# Patient Record
Sex: Male | Born: 1945
Health system: Southern US, Community
[De-identification: ages and names within clinical notes are randomized; demographics above are authoritative.]

## PROBLEM LIST (undated history)

## (undated) DIAGNOSIS — R0600 Dyspnea, unspecified: Secondary | ICD-10-CM

## (undated) DIAGNOSIS — K2289 Other specified disease of esophagus: Secondary | ICD-10-CM

## (undated) DIAGNOSIS — R011 Cardiac murmur, unspecified: Secondary | ICD-10-CM

## (undated) DIAGNOSIS — C801 Malignant (primary) neoplasm, unspecified: Secondary | ICD-10-CM

## (undated) DIAGNOSIS — J449 Chronic obstructive pulmonary disease, unspecified: Secondary | ICD-10-CM

## (undated) DIAGNOSIS — K228 Other specified diseases of esophagus: Secondary | ICD-10-CM

## (undated) DIAGNOSIS — I10 Essential (primary) hypertension: Secondary | ICD-10-CM

## (undated) DIAGNOSIS — Z9981 Dependence on supplemental oxygen: Secondary | ICD-10-CM

## (undated) DIAGNOSIS — M109 Gout, unspecified: Secondary | ICD-10-CM

## (undated) DIAGNOSIS — J189 Pneumonia, unspecified organism: Secondary | ICD-10-CM

## (undated) HISTORY — DX: Chronic obstructive pulmonary disease, unspecified: J44.9

## (undated) HISTORY — DX: Essential (primary) hypertension: I10

## (undated) HISTORY — PX: TONSILLECTOMY: SUR1361

## (undated) HISTORY — DX: Gout, unspecified: M10.9

---

## 1998-04-29 ENCOUNTER — Emergency Department (HOSPITAL_COMMUNITY): Admission: EM | Admit: 1998-04-29 | Discharge: 1998-04-29 | Payer: Self-pay | Admitting: Emergency Medicine

## 2011-10-29 ENCOUNTER — Other Ambulatory Visit: Payer: Self-pay | Admitting: Emergency Medicine

## 2011-10-30 ENCOUNTER — Encounter: Payer: Self-pay | Admitting: Family Medicine

## 2011-10-30 DIAGNOSIS — M109 Gout, unspecified: Secondary | ICD-10-CM | POA: Insufficient documentation

## 2011-10-30 DIAGNOSIS — E785 Hyperlipidemia, unspecified: Secondary | ICD-10-CM | POA: Insufficient documentation

## 2011-10-30 DIAGNOSIS — I10 Essential (primary) hypertension: Secondary | ICD-10-CM | POA: Insufficient documentation

## 2011-11-15 ENCOUNTER — Ambulatory Visit: Payer: Self-pay | Admitting: Family Medicine

## 2012-02-16 ENCOUNTER — Other Ambulatory Visit: Payer: Self-pay | Admitting: Family Medicine

## 2012-02-16 MED ORDER — LISINOPRIL 20 MG PO TABS: 20.0000 mg | ORAL_TABLET | Freq: Two times a day (BID) | ORAL | Status: DC

## 2012-03-26 ENCOUNTER — Other Ambulatory Visit: Payer: Self-pay | Admitting: Physician Assistant

## 2012-03-26 ENCOUNTER — Other Ambulatory Visit: Payer: Self-pay | Admitting: *Deleted

## 2012-03-26 MED ORDER — LISINOPRIL 20 MG PO TABS: 20.0000 mg | ORAL_TABLET | Freq: Two times a day (BID) | ORAL | Status: DC

## 2012-05-01 ENCOUNTER — Ambulatory Visit (INDEPENDENT_AMBULATORY_CARE_PROVIDER_SITE_OTHER): Payer: BC Managed Care – PPO | Admitting: Family Medicine

## 2012-05-01 VITALS — BP 142/88 | HR 70 | Temp 97.9°F | Resp 18 | Ht 68.25 in | Wt 165.0 lb

## 2012-05-01 DIAGNOSIS — I10 Essential (primary) hypertension: Secondary | ICD-10-CM

## 2012-05-01 DIAGNOSIS — G4700 Insomnia, unspecified: Secondary | ICD-10-CM

## 2012-05-01 DIAGNOSIS — M109 Gout, unspecified: Secondary | ICD-10-CM

## 2012-05-01 MED ORDER — LISINOPRIL 20 MG PO TABS: 20.0000 mg | ORAL_TABLET | Freq: Two times a day (BID) | ORAL | Status: DC

## 2012-05-01 MED ORDER — HYDROCODONE-ACETAMINOPHEN 5-325 MG PO TABS: 1.0000 | ORAL_TABLET | Freq: Four times a day (QID) | ORAL | Status: DC | PRN

## 2012-05-01 MED ORDER — INDOMETHACIN 50 MG PO CAPS: 50.0000 mg | ORAL_CAPSULE | Freq: Three times a day (TID) | ORAL | Status: DC | PRN

## 2012-05-01 MED ORDER — ZOLPIDEM TARTRATE 10 MG PO TABS: 10.0000 mg | ORAL_TABLET | Freq: Every evening | ORAL | Status: DC | PRN

## 2012-05-01 NOTE — Progress Notes (Signed)
[facility]:  Office Visit  Chief Complaint:  Chief Complaint  Patient presents with  . Medication Refill    HPI: George Stokes is a 66 y.o. male who complains of  Medication refills: 1. HTN- doing well. No SEs. BP within goal at home 2. Gout-unable to take allopurinol due to rash. He can not afford Colchicine. He takes indomethacine 9/12 months out of the year. Takes Hydrocodone intermittently for gouty attack pain.  3. Insomnia-intermittent, takes Ambien occasionally  Past Medical History  Diagnosis Date  . Hypertension   . Gout    History reviewed. No pertinent past surgical history. History   Social History  . Marital Status: Single    Spouse Name: N/A    Number of Children: N/A  . Years of Education: N/A   Social History Main Topics  . Smoking status: Never Smoker   . Smokeless tobacco: None  . Alcohol Use: No  . Drug Use: No  . Sexually Active: No   Other Topics Concern  . None   Social History Narrative  . None   Family History  Problem Relation Age of Onset  . Dementia Mother   . Cancer Father   . Gout Father   . Thyroid disease Sister   . Kidney disease Brother   . Gout Brother   . Diabetes Maternal Grandmother   . Stroke Maternal Grandfather    No Known Allergies Prior to Admission medications   Medication Sig Start Date End Date Taking? Authorizing Provider  HYDROcodone-acetaminophen (NORCO) 5-325 MG per tablet Take 1 tablet by mouth every 6 (six) hours as needed for pain. Need office visit for additional refills. 10/29/11  Yes Chelle S Jeffery, PA-C  indomethacin (INDOCIN) 50 MG capsule Take 50 mg by mouth every 8 (eight) hours as needed.   Yes Historical Provider, MD  lisinopril (PRINIVIL,ZESTRIL) 20 MG tablet Take 1 tablet (20 mg total) by mouth 2 (two) times daily. NEEDS OFFICE VISIT/LABS FOR REFILLS 2nd notice!! 03/26/12  Yes Ryan M Dunn, PA-C  zolpidem (AMBIEN) 10 MG tablet Take 10 mg by mouth at bedtime as needed.   Yes  Historical Provider, MD     ROS: The patient denies fevers, chills, night sweats, unintentional weight loss, chest pain, palpitations, wheezing, dyspnea on exertion, nausea, vomiting, abdominal pain, dysuria, hematuria, melena, numbness, weakness, or tingling.  All other systems have been reviewed and were otherwise negative with the exception of those mentioned in the HPI and as above.    PHYSICAL EXAM: Filed Vitals:   05/01/12 1254  BP: 142/88  Pulse: 70  Temp: 97.9 F (36.6 C)  Resp: 18   Filed Vitals:   05/01/12 1254  Height: 5' 8.25" (1.734 m)  Weight: 165 lb (74.844 kg)   Body mass index is 24.90 kg/(m^2).  General: Alert, no acute distress HEENT:  Normocephalic, atraumatic, oropharynx patent.  Cardiovascular:  Regular rate and rhythm, no rubs murmurs or gallops.  No Carotid bruits, radial pulse intact. No pedal edema.  Respiratory: Clear to auscultation bilaterally.  No wheezes, rales, or rhonchi.  No cyanosis, no use of accessory musculature GI: No organomegaly, abdomen is soft and non-tender, positive bowel sounds.  No masses. Skin: No rashes. Neurologic: Facial musculature symmetric. Psychiatric: Patient is appropriate throughout our interaction. Lymphatic: No cervical lymphadenopathy Musculoskeletal: Gait intact.   LABS: No results found for this or any previous visit.   EKG/XRAY:   Primary read interpreted by Dr. Conley Rolls at [facility].   ASSESSMENT/PLAN: Encounter Diagnoses  Name Primary?  . HTN (hypertension) Yes  . Gout   . Insomnia    Refilled meds Check BMP F/u in 6 months    Oleg Oleson PHUONG, DO 05/01/2012 1:37 PM

## 2012-05-02 LAB — BASIC METABOLIC PANEL WITH GFR
CO2: 33 meq/L — ABNORMAL HIGH (ref 19–32)
Chloride: 94 meq/L — ABNORMAL LOW (ref 96–112)
Creat: 0.81 mg/dL (ref 0.50–1.35)
Potassium: 5.7 meq/L — ABNORMAL HIGH (ref 3.5–5.3)
Sodium: 136 meq/L (ref 135–145)

## 2012-05-02 LAB — BASIC METABOLIC PANEL
BUN: 9 mg/dL (ref 6–23)
Calcium: 10.7 mg/dL — ABNORMAL HIGH (ref 8.4–10.5)
Glucose, Bld: 95 mg/dL (ref 70–99)

## 2012-05-09 ENCOUNTER — Encounter: Payer: Self-pay | Admitting: Family Medicine

## 2012-07-27 ENCOUNTER — Other Ambulatory Visit: Payer: Self-pay | Admitting: Radiology

## 2012-07-27 DIAGNOSIS — M109 Gout, unspecified: Secondary | ICD-10-CM

## 2012-07-27 MED ORDER — INDOMETHACIN 50 MG PO CAPS: 50.0000 mg | ORAL_CAPSULE | Freq: Three times a day (TID) | ORAL | Status: DC | PRN

## 2012-07-27 NOTE — Telephone Encounter (Signed)
Received fax for Idocin 50mg  from [employer], this was sent in with refill, but not received remaining refills sent in for him. Amy

## 2013-03-19 DIAGNOSIS — Z23 Encounter for immunization: Secondary | ICD-10-CM | POA: Diagnosis not present

## 2013-05-15 ENCOUNTER — Ambulatory Visit (INDEPENDENT_AMBULATORY_CARE_PROVIDER_SITE_OTHER): Payer: Medicare Other | Admitting: Emergency Medicine

## 2013-05-15 VITALS — BP 118/84 | HR 81 | Temp 98.3°F | Resp 20 | Ht 68.0 in | Wt 158.0 lb

## 2013-05-15 DIAGNOSIS — E785 Hyperlipidemia, unspecified: Secondary | ICD-10-CM

## 2013-05-15 DIAGNOSIS — G4700 Insomnia, unspecified: Secondary | ICD-10-CM | POA: Diagnosis not present

## 2013-05-15 DIAGNOSIS — M109 Gout, unspecified: Secondary | ICD-10-CM | POA: Diagnosis not present

## 2013-05-15 DIAGNOSIS — E782 Mixed hyperlipidemia: Secondary | ICD-10-CM

## 2013-05-15 DIAGNOSIS — I10 Essential (primary) hypertension: Secondary | ICD-10-CM | POA: Diagnosis not present

## 2013-05-15 LAB — COMPREHENSIVE METABOLIC PANEL
ALT: 11 U/L (ref 0–53)
BUN: 26 mg/dL — ABNORMAL HIGH (ref 6–23)
CO2: 35 mEq/L — ABNORMAL HIGH (ref 19–32)
Calcium: 9.6 mg/dL (ref 8.4–10.5)
Chloride: 100 mEq/L (ref 96–112)
Creat: 0.94 mg/dL (ref 0.50–1.35)

## 2013-05-15 LAB — POCT CBC
HCT, POC: 55.5 % — AB (ref 43.5–53.7)
Hemoglobin: 17.2 g/dL (ref 14.1–18.1)
MCH, POC: 29.6 pg (ref 27–31.2)
MCV: 95.6 fL (ref 80–97)
MPV: 13.7 fL (ref 0–99.8)
RBC: 5.81 M/uL (ref 4.69–6.13)
WBC: 8.2 10*3/uL (ref 4.6–10.2)

## 2013-05-15 LAB — LIPID PANEL
Cholesterol: 169 mg/dL (ref 0–200)
Total CHOL/HDL Ratio: 4.1 Ratio

## 2013-05-15 MED ORDER — LISINOPRIL 20 MG PO TABS: 20.0000 mg | ORAL_TABLET | Freq: Two times a day (BID) | ORAL | Status: DC

## 2013-05-15 MED ORDER — INDOMETHACIN 50 MG PO CAPS: 50.0000 mg | ORAL_CAPSULE | Freq: Three times a day (TID) | ORAL | Status: DC | PRN

## 2013-05-15 MED ORDER — LISINOPRIL 40 MG PO TABS: 40.0000 mg | ORAL_TABLET | Freq: Every day | ORAL | Status: DC

## 2013-05-15 MED ORDER — ZOLPIDEM TARTRATE 10 MG PO TABS: 10.0000 mg | ORAL_TABLET | Freq: Every evening | ORAL | Status: DC | PRN

## 2013-05-15 MED ORDER — HYDROCODONE-ACETAMINOPHEN 5-325 MG PO TABS: 1.0000 | ORAL_TABLET | Freq: Four times a day (QID) | ORAL | Status: DC | PRN

## 2013-05-15 MED ORDER — ALLOPURINOL 100 MG PO TABS: ORAL_TABLET | ORAL | Status: DC

## 2013-05-15 NOTE — Addendum Note (Signed)
Addended by: Carmelina Dane on: 05/15/2013 11:56 AM   Modules accepted: Orders

## 2013-05-15 NOTE — Addendum Note (Signed)
Addended by: Carmelina Dane on: 05/15/2013 12:38 PM   Modules accepted: Orders, Medications

## 2013-05-15 NOTE — Patient Instructions (Signed)

## 2013-05-15 NOTE — Progress Notes (Signed)
[facility] [address] [place] [address] [phone]  Date:  05/15/2013   Name:  George Stokes   DOB:  06-22-1946   MRN:  [id number]  PCP:  Default, Provider, MD    Chief Complaint: Medication Refill   History of Present Illness:  George Stokes is a 67 y.o. very pleasant male patient who presents with the following:  History of hypertension and gout.  Needs refills on his medication.  Has no adverse effect of medications.  Says that he will not take colchicine due to cost issues.   Used 39 hydrocodone last year.   No improvement with over the counter medications or other home remedies. Denies other complaint or health concern today.    Patient Active Problem List   Diagnosis Date Noted  . Gout 10/30/2011  . Hypertension 10/30/2011  . Dyslipidemia 10/30/2011    Past Medical History  Diagnosis Date  . Hypertension   . Gout     History reviewed. No pertinent past surgical history.  History  Substance Use Topics  . Smoking status: Never Smoker   . Smokeless tobacco: Not on file  . Alcohol Use: No    Family History  Problem Relation Age of Onset  . Dementia Mother   . Cancer Father   . Gout Father   . Thyroid disease Sister   . Kidney disease Brother   . Gout Brother   . Diabetes Maternal Grandmother   . Stroke Maternal Grandfather     No Known Allergies  Medication list has been reviewed and updated.  Current Outpatient Prescriptions on File Prior to Visit  Medication Sig Dispense Refill  . HYDROcodone-acetaminophen (NORCO/VICODIN) 5-325 MG per tablet Take 1 tablet by mouth every 6 (six) hours as needed for pain. Need office visit for additional refills.  30 tablet  1  . indomethacin (INDOCIN) 50 MG capsule Take 1 capsule (50 mg total) by mouth every 8 (eight) hours as needed.  30 capsule  5  . lisinopril (PRINIVIL,ZESTRIL) 20 MG tablet Take 1 tablet (20 mg total) by mouth 2 (two) times daily.  180 tablet  3  . zolpidem (AMBIEN) 10  MG tablet Take 1 tablet (10 mg total) by mouth at bedtime as needed.  30 tablet  1   No current facility-administered medications on file prior to visit.    Review of Systems:  As per HPI, otherwise negative.    Physical Examination: Filed Vitals:   05/15/13 1111  BP: 118/84  Pulse: 81  Temp: 98.3 F (36.8 C)  Resp: 20   Filed Vitals:   05/15/13 1111  Height: 5\' 8"  (1.727 m)  Weight: 158 lb (71.668 kg)   Body mass index is 24.03 kg/(m^2). Ideal Body Weight: Weight in (lb) to have BMI = 25: 164.1   GEN: WDWN, NAD, Non-toxic, Alert & Oriented x 3 HEENT: Atraumatic, Normocephalic.  Ears and Nose: No external deformity. EXTR: No clubbing/cyanosis/edema NEURO: Normal gait.  PSYCH: Normally interactive. Conversant. Not depressed or anxious appearing.  Calm demeanor.  CHEST Clear BS= COR rrr no murmur  Assessment and Plan: Gout Hypertension Requests allopurinol   Signed,  Phillips Odor, MD

## 2013-05-20 ENCOUNTER — Encounter: Payer: Self-pay | Admitting: Radiology

## 2013-06-05 ENCOUNTER — Telehealth: Payer: Self-pay

## 2013-06-05 NOTE — Telephone Encounter (Signed)
Patient called about labs, reviewed labs with patient.  Patient states understanding, copy of labs mailed to patient.

## 2013-07-27 ENCOUNTER — Telehealth: Payer: Self-pay

## 2013-07-27 NOTE — Telephone Encounter (Signed)
Faxed approval to pharm

## 2013-07-27 NOTE — Telephone Encounter (Signed)
[employer] nurse calling to inform us that patient's Indomethacin has been approved for 1 year.

## 2014-02-25 DIAGNOSIS — Z23 Encounter for immunization: Secondary | ICD-10-CM | POA: Diagnosis not present

## 2014-05-03 ENCOUNTER — Ambulatory Visit (INDEPENDENT_AMBULATORY_CARE_PROVIDER_SITE_OTHER): Payer: Medicare Other

## 2014-05-03 ENCOUNTER — Other Ambulatory Visit: Payer: Self-pay | Admitting: Emergency Medicine

## 2014-05-03 ENCOUNTER — Ambulatory Visit (INDEPENDENT_AMBULATORY_CARE_PROVIDER_SITE_OTHER): Payer: Medicare Other | Admitting: Emergency Medicine

## 2014-05-03 VITALS — BP 188/104 | HR 76 | Temp 98.4°F | Resp 18 | Ht 68.0 in | Wt 156.0 lb

## 2014-05-03 DIAGNOSIS — M109 Gout, unspecified: Secondary | ICD-10-CM | POA: Diagnosis not present

## 2014-05-03 DIAGNOSIS — R0602 Shortness of breath: Secondary | ICD-10-CM | POA: Diagnosis not present

## 2014-05-03 DIAGNOSIS — J441 Chronic obstructive pulmonary disease with (acute) exacerbation: Secondary | ICD-10-CM | POA: Diagnosis not present

## 2014-05-03 DIAGNOSIS — J449 Chronic obstructive pulmonary disease, unspecified: Secondary | ICD-10-CM | POA: Insufficient documentation

## 2014-05-03 DIAGNOSIS — G4700 Insomnia, unspecified: Secondary | ICD-10-CM | POA: Diagnosis not present

## 2014-05-03 DIAGNOSIS — R0902 Hypoxemia: Secondary | ICD-10-CM

## 2014-05-03 LAB — POCT CBC
Granulocyte percent: 76.5 %G (ref 37–80)
HEMATOCRIT: 56.5 % — AB (ref 43.5–53.7)
Hemoglobin: 18 g/dL (ref 14.1–18.1)
Lymph, poc: 1.2 (ref 0.6–3.4)
MCH, POC: 30.7 pg (ref 27–31.2)
MCHC: 31.9 g/dL (ref 31.8–35.4)
MCV: 96.1 fL (ref 80–97)
MID (cbc): 0.4 (ref 0–0.9)
MPV: 9.4 fL (ref 0–99.8)
POC Granulocyte: 5.4 (ref 2–6.9)
POC LYMPH %: 17.3 % (ref 10–50)
POC MID %: 6.2 %M (ref 0–12)
Platelet Count, POC: 153 10*3/uL (ref 142–424)
RBC: 5.88 M/uL (ref 4.69–6.13)
RDW, POC: 13.9 %
WBC: 7.1 10*3/uL (ref 4.6–10.2)

## 2014-05-03 MED ORDER — ALLOPURINOL 100 MG PO TABS: ORAL_TABLET | ORAL | Status: DC

## 2014-05-03 MED ORDER — BUDESONIDE-FORMOTEROL FUMARATE 160-4.5 MCG/ACT IN AERO: 2.0000 | INHALATION_SPRAY | Freq: Two times a day (BID) | RESPIRATORY_TRACT | Status: DC

## 2014-05-03 MED ORDER — AMLODIPINE BESYLATE 5 MG PO TABS: 5.0000 mg | ORAL_TABLET | Freq: Every day | ORAL | Status: DC

## 2014-05-03 MED ORDER — ZOLPIDEM TARTRATE 5 MG PO TABS: ORAL_TABLET | ORAL | Status: DC

## 2014-05-03 MED ORDER — INDOMETHACIN 50 MG PO CAPS: 50.0000 mg | ORAL_CAPSULE | Freq: Three times a day (TID) | ORAL | Status: DC | PRN

## 2014-05-03 MED ORDER — HYDROCODONE-ACETAMINOPHEN 5-325 MG PO TABS: 1.0000 | ORAL_TABLET | Freq: Four times a day (QID) | ORAL | Status: DC | PRN

## 2014-05-03 NOTE — Patient Instructions (Signed)

## 2014-05-03 NOTE — Progress Notes (Deleted)
Subjective:    Patient ID: George Stokes, male    DOB: Jan 02, 1946, 68 y.o.   MRN: [id number]  HPI    Review of Systems     Objective:   Physical Exam        Assessment & Plan:

## 2014-05-03 NOTE — Progress Notes (Addendum)
Subjective:    Patient ID: George Stokes, male    DOB: [birth year], 68 y.o.   MRN: [id number] This chart was scribed for George Spare A. Cleta Alberts, MD by Chestine Spore, ED Scribe. The patient was seen in room 3 at 12:46 PM.   Chief Complaint  Patient presents with  . Medication Refill    indocin, Ambien, norco  . Other    low O 2     HPI George Stokes is a 68 y.o. male with a medical hx of gout and HTN who presents today complaining of medication refill and low O2. He states that he can now walk a mile but not without stopping because of fatigue. He states that he does not feel out of breath today. He states that he didn't come in today because he couldn't breathe. He states that he takes the Norco for his gout. He states that he is having associated symptoms of cough.  He denies CP and any other symptoms. He states that he stopped smoking in 04/2009. He states that his last CXR was in 2007. He denies the use of inhalers. He states that he monitors his drinks daily and he drinks over 2 drinks a day to where he drinks typically 3-4 drinks. He states that he takes Lisinopril. He states that he takes it at night time. He denies ever seeing a lung doctor. 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   Left arm blood pressure 186/86 in office. Right arm blood pressure 186/80 in office.  Per chart review pt had a pulse oximetry of 85% in 04/2013 and 92% in 04/2012.    Patient Active Problem List   Diagnosis Date Noted  . Gout 10/30/2011  . Hypertension 10/30/2011  . Dyslipidemia 10/30/2011   Past Medical History  Diagnosis Date  . Hypertension   . Gout    No past surgical history on file. No Known Allergies Prior to Admission medications   Medication Sig Start Date End Date Taking? Authorizing Provider  allopurinol (ZYLOPRIM) 100 MG tablet Take one daily for one week, 2 for second and 3 from then on 05/15/13  Yes Carmelina Dane, MD  HYDROcodone-acetaminophen (NORCO/VICODIN) 5-325 MG per  tablet Take 1 tablet by mouth every 6 (six) hours as needed. Need office visit for additional refills. 05/15/13  Yes Carmelina Dane, MD  indomethacin (INDOCIN) 50 MG capsule Take 1 capsule (50 mg total) by mouth every 8 (eight) hours as needed. 05/15/13  Yes Carmelina Dane, MD  lisinopril (PRINIVIL,ZESTRIL) 40 MG tablet Take 1 tablet (40 mg total) by mouth daily. 05/15/13  Yes Carmelina Dane, MD  zolpidem (AMBIEN) 10 MG tablet Take 1 tablet (10 mg total) by mouth at bedtime as needed. 05/15/13  Yes Carmelina Dane, MD      Review of Systems  Respiratory: Positive for cough.   Cardiovascular: Negative for chest pain.  All other systems reviewed and are negative.      Objective:   Physical Exam  Constitutional: He is oriented to person, place, and time. He appears well-developed and well-nourished. No distress.  HENT:  Head: Normocephalic and atraumatic.  Eyes: EOM are normal.  Neck: Neck supple. No tracheal deviation present.  Cardiovascular: Normal rate.   Pulmonary/Chest: Effort normal. No respiratory distress. He has decreased breath sounds. He has rales.  Rales noted on the left with bilateral decreased breath sounds.   Abdominal:  Enlarged liver.   Musculoskeletal: Normal range of motion.  Neurological: He is  alert and oriented to person, place, and time.  Skin: Skin is warm and dry.  Psychiatric: He has a normal mood and affect. His behavior is normal.  Nursing note and vitals reviewed.  [facility] reading (PRIMARY) by  Dr. Guy Begin bpatient has severe end-stage COPD     BP 188/104 mmHg  Pulse 76  Temp(Src) 98.4 F (36.9 C) (Oral)  Resp 18  Ht 5\' 8"  (1.727 m)  Wt 156 lb (70.761 kg)  BMI 23.73 kg/m2  SpO2 81% Assessment & Plan:  DIAGNOSTIC STUDIES: Oxygen Saturation is 81% on room air, low by my interpretation.    COORDINATION OF CARE: 12:59 PM-Discussed treatment plan which includes CBC, CXR, and breathing test with pt at bedside and pt agreed to plan.    Patient has severe obstructive lung disease with an FVC of 51% FEV1 of 16%. Referral made to pulmonary for their assistance. Will start on Symbicort 2 puffs twice a day I stopped his lisinopril will start amlodipine 10 1 a day. He was advised to cut back on his drinking. I personally performed the services described in this documentation, which was scribed in my presence. The recorded information has been reviewed and is accurate.

## 2014-05-04 LAB — COMPREHENSIVE METABOLIC PANEL
ALK PHOS: 81 U/L (ref 39–117)
ALT: 12 U/L (ref 0–53)
AST: 16 U/L (ref 0–37)
Albumin: 4.3 g/dL (ref 3.5–5.2)
BUN: 14 mg/dL (ref 6–23)
CHLORIDE: 95 meq/L — AB (ref 96–112)
CO2: 33 mEq/L — ABNORMAL HIGH (ref 19–32)
Calcium: 9.7 mg/dL (ref 8.4–10.5)
Creat: 0.92 mg/dL (ref 0.50–1.35)
GLUCOSE: 95 mg/dL (ref 70–99)
Potassium: 5.3 mEq/L (ref 3.5–5.3)
Sodium: 142 mEq/L (ref 135–145)
Total Bilirubin: 0.6 mg/dL (ref 0.2–1.2)
Total Protein: 7 g/dL (ref 6.0–8.3)

## 2014-05-04 LAB — URIC ACID: Uric Acid, Serum: 7.7 mg/dL (ref 4.0–7.8)

## 2014-06-14 ENCOUNTER — Telehealth: Payer: Self-pay

## 2014-06-14 DIAGNOSIS — Z8709 Personal history of other diseases of the respiratory system: Secondary | ICD-10-CM

## 2014-06-14 DIAGNOSIS — R0602 Shortness of breath: Secondary | ICD-10-CM

## 2014-06-14 NOTE — Telephone Encounter (Signed)
Patient says he procrastinated on calling [facility] back to schedule for his COPD and shortness of breath referral. He says they will not schedule because [employer] cancelled referral. Please advise if we can place a new one. Patient see's Dr. Everlene Farrier.   Thank you

## 2014-07-17 ENCOUNTER — Institutional Professional Consult (permissible substitution): Payer: Self-pay | Admitting: Pulmonary Disease

## 2014-08-12 ENCOUNTER — Institutional Professional Consult (permissible substitution): Payer: Self-pay | Admitting: Pulmonary Disease

## 2014-08-16 ENCOUNTER — Encounter (INDEPENDENT_AMBULATORY_CARE_PROVIDER_SITE_OTHER): Payer: Self-pay

## 2014-08-16 ENCOUNTER — Ambulatory Visit (INDEPENDENT_AMBULATORY_CARE_PROVIDER_SITE_OTHER): Payer: Medicare Other | Admitting: Pulmonary Disease

## 2014-08-16 ENCOUNTER — Encounter: Payer: Self-pay | Admitting: Pulmonary Disease

## 2014-08-16 VITALS — BP 154/80 | HR 74 | Temp 97.5°F | Ht 68.0 in | Wt 153.8 lb

## 2014-08-16 DIAGNOSIS — J438 Other emphysema: Secondary | ICD-10-CM

## 2014-08-16 NOTE — Progress Notes (Signed)
Subjective:    Patient ID: George Stokes, male    DOB: 09-05-1945, 69 y.o.   MRN: [id number]  HPI The patient is a 69 year old male who I've been asked to see for management of COPD. He has a long-standing history of tobacco abuse, but has not smoked since 2010. He tells me that he has had breathing issues for years, but the last 2 years has seen significant worsening. He tells me that he can walk a mile with multiple stops, and he will intermittently get winded walking up a flight of stairs or bringing groceries in from the car. He can sometimes get short of breath dressing. He denies any cough or mucus production. He has had spirometry with his primary physician, with an FEV1 of 16% of predicted? His chest x-ray in November of last year showed significant hyperinflation but no acute process. The patient is never been started on oxygen, and his saturations at rest are excellent. He is on Symbicort which she thinks has helped him, but believes it is becoming "less effective". He denies any history of cardiac disease.   Review of Systems  Constitutional: Negative for fever and unexpected weight change.  HENT: Negative for congestion, dental problem, ear pain, nosebleeds, postnasal drip, rhinorrhea, sinus pressure, sneezing, sore throat and trouble swallowing.   Eyes: Negative for redness and itching.  Respiratory: Positive for shortness of breath. Negative for cough, chest tightness and wheezing.   Cardiovascular: Negative for palpitations and leg swelling.  Gastrointestinal: Negative for nausea and vomiting.  Genitourinary: Negative for dysuria.  Musculoskeletal: Negative for joint swelling.  Skin: Negative for rash.  Neurological: Negative for headaches.  Hematological: Does not bruise/bleed easily.  Psychiatric/Behavioral: Negative for dysphoric mood. The patient is not nervous/anxious.        Objective:   Physical Exam Constitutional:  Well developed, no acute distress  HENT:  Nares  patent without discharge, septal deviation to the left with narrowing  Oropharynx without exudate, palate and uvula are thick and elongated.  Eyes:  Perrla, eomi, no scleral icterus  Neck:  No JVD, no TMG  Cardiovascular:  Normal rate, regular rhythm, no rubs or gallops.  No murmurs        Intact distal pulses  Pulmonary :  Decreased  breath sounds, no stridor or respiratory distress   No rales, rhonchi, or wheezing  Abdominal:  Soft, nondistended, bowel sounds present.  No tenderness noted.   Musculoskeletal:  No lower extremity edema noted.  Lymph Nodes:  No cervical lymphadenopathy noted  Skin:  No cyanosis noted  Neurologic:  Alert, appropriate, moves all 4 extremities without obvious deficit.         Assessment & Plan:

## 2014-08-16 NOTE — Assessment & Plan Note (Addendum)
The patient has a long history of tobacco abuse, along with progressive dyspnea on exertion. His chest x-ray shows hyperinflation, and he does have decreased breath sounds on exam. He has had recent spirometry, but I'm unsure if his FEV1 is really 16% of predicted. I would like to schedule for full PFTs prior to his next visit so that we can get some idea of his risk assessment. I have asked him to continue on symbicort, and will add Spiriva to see if this will help with his daily dyspnea on exertion. Will also discuss with him the role of pulmonary rehabilitation at the next visit as well.  Finally, we checked his oxygen saturations today with ambulation, and he had desaturation as low as 86%. Discussed with him considering portable oxygen with exertion, but he would like to think about this. We will do overnight oximetry since this is much more of a significant issue.

## 2014-08-16 NOTE — Patient Instructions (Signed)
Continue on symbicort 2 puffs twice a day Start spiriva respimat  2 inhalations each am. Will schedule for breathing studies in 4 weeks, with same day visit with me.

## 2014-09-04 DIAGNOSIS — J438 Other emphysema: Secondary | ICD-10-CM | POA: Diagnosis not present

## 2014-09-05 ENCOUNTER — Institutional Professional Consult (permissible substitution): Payer: Self-pay | Admitting: Pulmonary Disease

## 2014-09-13 ENCOUNTER — Telehealth: Payer: Self-pay | Admitting: Pulmonary Disease

## 2014-09-13 NOTE — Telephone Encounter (Signed)
Let pt know that his oxygen level falls to 78% during the night, and spends over 5hrs during the night less than 88% He needs to start on oxygen during sleep.  Let me know if he is willing to do this.

## 2014-09-13 NOTE — Telephone Encounter (Signed)
[id number] x1 

## 2014-09-16 NOTE — Telephone Encounter (Signed)
I spoke with patient about results and he verbalized understanding. Pt has apt 3/29 and wants to discuss this then. Nothing further needed

## 2014-09-24 ENCOUNTER — Encounter: Payer: Self-pay | Admitting: Pulmonary Disease

## 2014-09-24 ENCOUNTER — Ambulatory Visit (INDEPENDENT_AMBULATORY_CARE_PROVIDER_SITE_OTHER): Payer: Medicare Other | Admitting: Pulmonary Disease

## 2014-09-24 VITALS — BP 170/84 | HR 79 | Temp 97.8°F | Ht 68.0 in | Wt 160.0 lb

## 2014-09-24 DIAGNOSIS — J438 Other emphysema: Secondary | ICD-10-CM | POA: Diagnosis not present

## 2014-09-24 LAB — PULMONARY FUNCTION TEST
DL/VA % pred: 56 %
DL/VA: 2.52 ml/min/mmHg/L
DLCO unc % pred: 53 %
DLCO unc: 15.89 ml/min/mmHg
FEF 25-75 Post: 0.29 L/sec
FEF 25-75 Pre: 0.25 L/sec
FEF2575-%Change-Post: 14 %
FEF2575-%PRED-POST: 11 %
FEF2575-%Pred-Pre: 10 %
FEV1-%Change-Post: 11 %
FEV1-%PRED-POST: 21 %
FEV1-%PRED-PRE: 19 %
FEV1-Post: 0.67 L
FEV1-Pre: 0.6 L
FEV1FVC-%Change-Post: 10 %
FEV1FVC-%Pred-Pre: 43 %
FEV6-%Change-Post: 8 %
FEV6-%Pred-Post: 44 %
FEV6-%Pred-Pre: 41 %
FEV6-Post: 1.78 L
FEV6-Pre: 1.64 L
FEV6FVC-%Change-Post: 7 %
FEV6FVC-%PRED-POST: 100 %
FEV6FVC-%Pred-Pre: 93 %
FVC-%CHANGE-POST: 0 %
FVC-%PRED-PRE: 44 %
FVC-%Pred-Post: 44 %
FVC-POST: 1.86 L
FVC-PRE: 1.85 L
POST FEV6/FVC RATIO: 96 %
PRE FEV6/FVC RATIO: 89 %
Post FEV1/FVC ratio: 36 %
Pre FEV1/FVC ratio: 33 %
RV % pred: 254 %
RV: 5.78 L
TLC % PRED: 132 %
TLC: 8.78 L

## 2014-09-24 MED ORDER — BUDESONIDE-FORMOTEROL FUMARATE 160-4.5 MCG/ACT IN AERO: 2.0000 | INHALATION_SPRAY | Freq: Two times a day (BID) | RESPIRATORY_TRACT | Status: DC

## 2014-09-24 NOTE — Progress Notes (Signed)
PFT done today. 

## 2014-09-24 NOTE — Progress Notes (Signed)
Subjective:    Patient ID: George Stokes, male    DOB: 07/24/1945, 69 y.o.   MRN: [id number]  HPI The patient comes in today for follow-up of his recent PFTs, done as part of a workup for dyspnea on exertion. He was found to have very severe airflow obstruction, as well as severe air trapping. He has also been found to have significant oxygen desaturation on his overnight oximetry, and also desaturation with walking. I have reviewed his study with him in detail, and answered all of his questions. He was started on Spiriva at the last visit in addition to his Symbicort, and really has not seen any change in his breathing. He would like to come off the medication.   Review of Systems  Constitutional: Negative for fever and unexpected weight change.  HENT: Negative for congestion, dental problem, ear pain, nosebleeds, postnasal drip, rhinorrhea, sinus pressure, sneezing, sore throat and trouble swallowing.   Eyes: Negative for redness and itching.  Respiratory: Positive for shortness of breath. Negative for cough, chest tightness and wheezing.   Cardiovascular: Negative for palpitations and leg swelling.  Gastrointestinal: Negative for nausea and vomiting.  Genitourinary: Negative for dysuria.  Musculoskeletal: Negative for joint swelling.  Skin: Negative for rash.  Neurological: Negative for headaches.  Hematological: Does not bruise/bleed easily.  Psychiatric/Behavioral: Negative for dysphoric mood. The patient is not nervous/anxious.        Objective:   Physical Exam Thin male in no acute distress Nose without purulence or discharge noted Neck without lymphadenopathy or thyromegaly Chest with very diminished breath sounds, no wheezing Lower extremities without edema, no cyanosis Alert and oriented, moves all 4 extremities.       Assessment & Plan:

## 2014-09-24 NOTE — Patient Instructions (Signed)
Stop spiriva since this did not seem to help you. Will continue on symbicort, and give you prescription to take to pharmacy Will start on oxygen at night. Will have pulmonary rehab call you to discuss their program.   followup with me again in 38mos, but call if having issues with your breathing.

## 2014-09-24 NOTE — Assessment & Plan Note (Signed)
The patient has very severe obstructive lung disease by his PFTs secondary to emphysema. He feels that he has benefited from Symbicort, but did not see a change with Spiriva. He has significant desaturation at night, and I have highly recommended that he use oxygen during sleep. He is willing to do this, but does not want to use oxygen with exertion. I also discussed with him the importance of pulmonary rehabilitation, and he would like to discuss with them the aspects of the program.

## 2014-09-27 ENCOUNTER — Telehealth (HOSPITAL_COMMUNITY): Payer: Self-pay

## 2014-09-27 NOTE — Telephone Encounter (Signed)
Called patient to discuss pulmonary rehab referral per Dr. Gwenette Greet.  I was not able to leave a message.  Letter sent in mail for patient to contact us.  I will try to follow up in one week.

## 2014-09-30 ENCOUNTER — Telehealth (HOSPITAL_COMMUNITY): Payer: Self-pay

## 2014-09-30 NOTE — Telephone Encounter (Signed)
Called patient to discuss Pulmonary Rehab.  Patient states that it was not a good time to discuss the matter.  I gave the patient our contact information and he stated that he would follow up at another time.

## 2014-10-18 ENCOUNTER — Telehealth (HOSPITAL_COMMUNITY): Payer: Self-pay

## 2014-10-18 NOTE — Telephone Encounter (Signed)
Called patient in regards to Pulmonary Rehab referral from Dr. Gwenette Greet.  Patient states that he does not want another call back and that he will follow up with [place] in May to discuss the program.

## 2014-10-29 ENCOUNTER — Telehealth (HOSPITAL_COMMUNITY): Payer: Self-pay

## 2014-10-29 NOTE — Telephone Encounter (Signed)
Patient called to discuss Pulmonary Rehab.  Patient had confused [place] with the sleep clinic.  Patient states that he does not need help exercisng that he has many years of experience.  Patient was encouraged to contact us in the future if he ever changes his mind.

## 2014-11-27 ENCOUNTER — Telehealth: Payer: Self-pay | Admitting: Pulmonary Disease

## 2014-11-27 NOTE — Telephone Encounter (Signed)
Spoke with pt. He is wanting a prescription for Spiriva sent in. At his last appointment, [facility] asked pt to stop taking this. He would like to start taking again.  Sylva - please advise. Thanks.

## 2014-11-27 NOTE — Telephone Encounter (Signed)
It was stopped because he said it did not help him Ok to send in respimat 2 inhalations each am

## 2014-11-27 NOTE — Telephone Encounter (Signed)
ATC pt, line rang multiple times with NA and no option to leave msg.  WCB

## 2014-11-28 NOTE — Telephone Encounter (Signed)
ATC pt NA WCB 

## 2014-11-29 MED ORDER — TIOTROPIUM BROMIDE MONOHYDRATE 2.5 MCG/ACT IN AERS: 2.0000 | INHALATION_SPRAY | Freq: Every day | RESPIRATORY_TRACT | Status: DC

## 2014-11-29 NOTE — Telephone Encounter (Signed)
Discussed with patient, he says that when he first tried Spiriva Respimat, his insurance said it would cost him over $150.  Now, he spoke with [facility] and they told him it would be less than $20 so he is wanting to use it.  I sent refill of Spiriva Respimat to [facility] per patient's request. Nothing further needed.

## 2015-04-01 ENCOUNTER — Encounter: Payer: Self-pay | Admitting: Emergency Medicine

## 2015-04-30 DIAGNOSIS — Z23 Encounter for immunization: Secondary | ICD-10-CM | POA: Diagnosis not present

## 2015-05-01 ENCOUNTER — Ambulatory Visit (INDEPENDENT_AMBULATORY_CARE_PROVIDER_SITE_OTHER): Payer: Medicare Other | Admitting: Family Medicine

## 2015-05-01 VITALS — BP 135/80 | HR 86 | Temp 98.2°F | Resp 16 | Ht 68.0 in | Wt 172.0 lb

## 2015-05-01 DIAGNOSIS — M109 Gout, unspecified: Secondary | ICD-10-CM | POA: Diagnosis not present

## 2015-05-01 DIAGNOSIS — G4700 Insomnia, unspecified: Secondary | ICD-10-CM | POA: Diagnosis not present

## 2015-05-01 DIAGNOSIS — I10 Essential (primary) hypertension: Secondary | ICD-10-CM | POA: Diagnosis not present

## 2015-05-01 LAB — COMPLETE METABOLIC PANEL WITH GFR
ALT: 26 U/L (ref 9–46)
AST: 22 U/L (ref 10–35)
BUN: 18 mg/dL (ref 7–25)
CO2: 27 mmol/L (ref 20–31)
Chloride: 98 mmol/L (ref 98–110)
Creat: 1.07 mg/dL (ref 0.70–1.25)
GFR, Est African American: 83 mL/min (ref >=60)
GFR, Est Non African American: 71 mL/min (ref >=60)
Glucose, Bld: 100 mg/dL — ABNORMAL HIGH (ref 65–99)
Total Bilirubin: 0.6 mg/dL (ref 0.2–1.2)
Total Protein: 7 g/dL (ref 6.1–8.1)

## 2015-05-01 LAB — COMPLETE METABOLIC PANEL WITHOUT GFR
Albumin: 4.5 g/dL (ref 3.6–5.1)
Alkaline Phosphatase: 96 U/L (ref 40–115)
Calcium: 9.8 mg/dL (ref 8.6–10.3)
Potassium: 4.4 mmol/L (ref 3.5–5.3)
Sodium: 136 mmol/L (ref 135–146)

## 2015-05-01 MED ORDER — INDOMETHACIN 50 MG PO CAPS: 50.0000 mg | ORAL_CAPSULE | Freq: Three times a day (TID) | ORAL | Status: DC | PRN

## 2015-05-01 MED ORDER — HYDROCODONE-ACETAMINOPHEN 5-325 MG PO TABS: 1.0000 | ORAL_TABLET | Freq: Four times a day (QID) | ORAL | Status: DC | PRN

## 2015-05-01 MED ORDER — ZOLPIDEM TARTRATE 5 MG PO TABS: ORAL_TABLET | ORAL | Status: AC

## 2015-05-01 MED ORDER — AMLODIPINE BESYLATE 5 MG PO TABS: 5.0000 mg | ORAL_TABLET | Freq: Every day | ORAL | Status: DC

## 2015-05-01 NOTE — Progress Notes (Signed)
Chief Complaint:  Chief Complaint  Patient presents with  . Medication Refill    everything on his medication  list    HPI:  George Stokes is a 69 y.o. male who reports to [facility] today complaining of Here for medication refills,  He is doing well overall He takes his medicines regular, he denies any SEs He denies CP , palpitations, weakness, dizziness, numbness or tingling.  He has been on norco for pain and on average takes about 40 pills annually, he brings in an index card that he uses to keep track of his meds, his averages and since 2012 he has been averaging about 40 pills of norco YEARLY He also is on indomethacin for gout, takes about 150 yearly to prevent gout, has not had a flare up in months.  No n/v/abd pain He takes zolpidem for sleep prn and takes about 30-40 tabs yearly Again no abuse issues or SEs.  He is nervous when he comes into the office. We had to get 3 blood pressure reading before he was able to calm down.  He has COPD and his breathing is doing ok now. He used to see Dr Gwenette Greet for this.   BP Readings from Last 3 Encounters:  05/01/15 135/80  09/24/14 170/84  08/16/14 154/80     Past Medical History  Diagnosis Date  . Hypertension   . Gout   . COPD (chronic obstructive pulmonary disease) ([place])    History reviewed. No pertinent past surgical history. Social History   Social History  . Marital Status: Single    Spouse Name: N/A  . Number of Children: N/A  . Years of Education: N/A   Occupational History  . retired    Social History Main Topics  . Smoking status: Former Smoker -- 2.00 packs/day for 43 years    Types: Cigarettes    Quit date: 05/03/2009  . Smokeless tobacco: None  . Alcohol Use: 0.0 oz/week    0 Standard drinks or equivalent per week     Comment: 2-4  . Drug Use: No  . Sexual Activity: No   Other Topics Concern  . None   Social History Narrative   Family History  Problem Relation Age of Onset  . Dementia Mother    . Cancer Father   . Gout Father   . Thyroid disease Sister   . Kidney disease Brother   . Gout Brother   . Diabetes Maternal Grandmother   . Stroke Maternal Grandfather    No Known Allergies Prior to Admission medications   Medication Sig Start Date End Date Taking? Authorizing Provider  amLODipine (NORVASC) 5 MG tablet Take 1 tablet (5 mg total) by mouth daily. 05/03/14  Yes Darlyne Russian, MD  budesonide-formoterol (SYMBICORT) 160-4.5 MCG/ACT inhaler Inhale 2 puffs into the lungs 2 (two) times daily. 09/24/14  Yes Kathee Delton, MD  clotrimazole (LOTRIMIN) 1 % cream Apply 1 application topically as needed.   Yes Historical Provider, MD  HYDROcodone-acetaminophen (NORCO/VICODIN) 5-325 MG per tablet Take 1 tablet by mouth every 6 (six) hours as needed. Need office visit for additional refills. 05/03/14  Yes Darlyne Russian, MD  indomethacin (INDOCIN) 50 MG capsule Take 1 capsule (50 mg total) by mouth every 8 (eight) hours as needed. 05/03/14  Yes Darlyne Russian, MD  Tiotropium Bromide Monohydrate (SPIRIVA RESPIMAT) 2.5 MCG/ACT AERS Inhale 2 puffs into the lungs daily. 11/29/14  Yes Kathee Delton, MD  zolpidem Lorrin Mais) 5  MG tablet Take 1 tablet daily at bedtime. Patient taking differently: Take 1 tablet daily at bedtime as needed 05/03/14  Yes Darlyne Russian, MD     ROS: The patient denies fevers, chills, night sweats, unintentional weight loss, chest pain, palpitations, wheezing, dyspnea on exertion, nausea, vomiting, abdominal pain, dysuria, hematuria, melena, numbness, weakness, or tingling.   All other systems have been reviewed and were otherwise negative with the exception of those mentioned in the HPI and as above.    PHYSICAL EXAM: Filed Vitals:   05/01/15 1432  BP: 135/80  Pulse:   Temp:   Resp:    Body mass index is 26.16 kg/(m^2).   General: Alert, no acute distress HEENT:  Normocephalic, atraumatic, oropharynx patent. EOMI, PERRLA Cardiovascular:  Regular rate and rhythm,  no rubs murmurs or gallops.  No Carotid bruits, radial pulse intact. Respiratory: Clear to auscultation bilaterally.  No wheezes, rales, or rhonchi.  No cyanosis, no use of accessory musculature Abdominal: No organomegaly, abdomen is soft and non-tender, positive bowel sounds. No masses. Skin: No rashes. Neurologic: Facial musculature symmetric. Psychiatric: Patient acts appropriately throughout our interaction. Lymphatic: No cervical or submandibular lymphadenopathy Musculoskeletal: Gait intact. No edema, tenderness   LABS: Results for orders placed or performed in visit on 05/01/15  COMPLETE METABOLIC PANEL WITH GFR  Result Value Ref Range   Sodium 136 135 - 146 mmol/L   Potassium 4.4 3.5 - 5.3 mmol/L   Chloride 98 98 - 110 mmol/L   CO2 27 20 - 31 mmol/L   Glucose, Bld 100 (H) 65 - 99 mg/dL   BUN 18 7 - 25 mg/dL   Creat 1.07 0.70 - 1.25 mg/dL   Total Bilirubin 0.6 0.2 - 1.2 mg/dL   Alkaline Phosphatase 96 40 - 115 U/L   AST 22 10 - 35 U/L   ALT 26 9 - 46 U/L   Total Protein 7.0 6.1 - 8.1 g/dL   Albumin 4.5 3.6 - 5.1 g/dL   Calcium 9.8 8.6 - 10.3 mg/dL   GFR, Est African American 83 >=60 mL/min   GFR, Est Non African American 71 >=60 mL/min     EKG/XRAY:   Primary read interpreted by Dr. Marin Comment at [facility].   ASSESSMENT/PLAN: Encounter Diagnoses  Name Primary?  . Essential hypertension Yes  . Gout without tophus, unspecified cause, unspecified chronicity, unspecified site   . Insomnia    Refilled meds x 1 year if possible BP is ok, he is anxious when he first comes in here, advise to get bp cuff and measure at home Labs pending, BP goal is less than 140/90. He will monitor.    Gross sideeffects, risk and benefits, and alternatives of medications d/w patient. Patient is aware that all medications have potential sideeffects and we are unable to predict every sideeffect or drug-drug interaction that may occur.  Abraham Margulies DO  05/02/2015 3:42 PM

## 2015-05-02 DIAGNOSIS — Z23 Encounter for immunization: Secondary | ICD-10-CM | POA: Diagnosis not present

## 2015-09-10 ENCOUNTER — Telehealth: Payer: Self-pay

## 2015-09-10 NOTE — Telephone Encounter (Signed)
PA completed for indomethacin on covermymeds. Pending. 

## 2015-11-06 ENCOUNTER — Telehealth: Payer: Self-pay | Admitting: Internal Medicine

## 2015-11-06 MED ORDER — BUDESONIDE-FORMOTEROL FUMARATE 160-4.5 MCG/ACT IN AERO: 2.0000 | INHALATION_SPRAY | Freq: Two times a day (BID) | RESPIRATORY_TRACT | Status: DC

## 2015-11-06 NOTE — Telephone Encounter (Signed)
Spoke with the pt  I advised we will refill the Symbicort, but he needs to be sure and keep ov with MW on 11/27/15 so that we can continue to refill his meds  He verbalized understanding and rx was sent x 1 only  Nothing further needed

## 2015-11-27 ENCOUNTER — Ambulatory Visit (INDEPENDENT_AMBULATORY_CARE_PROVIDER_SITE_OTHER): Payer: Medicare Other | Admitting: Internal Medicine

## 2015-11-27 ENCOUNTER — Encounter: Payer: Self-pay | Admitting: Internal Medicine

## 2015-11-27 VITALS — BP 146/90 | HR 84 | Ht 67.5 in | Wt 178.0 lb

## 2015-11-27 DIAGNOSIS — J449 Chronic obstructive pulmonary disease, unspecified: Secondary | ICD-10-CM

## 2015-11-27 MED ORDER — BUDESONIDE-FORMOTEROL FUMARATE 160-4.5 MCG/ACT IN AERO: 2.0000 | INHALATION_SPRAY | Freq: Two times a day (BID) | RESPIRATORY_TRACT | Status: DC

## 2015-11-27 MED ORDER — TIOTROPIUM BROMIDE MONOHYDRATE 2.5 MCG/ACT IN AERS: 2.0000 | INHALATION_SPRAY | Freq: Every day | RESPIRATORY_TRACT | Status: DC

## 2015-11-27 NOTE — Progress Notes (Signed)
Subjective:     Patient ID: Randarius Piekarski, male   DOB: 21-Apr-1946,   MRN: [id number]  HPI  50 yowm quit smoking 2010 with GOLD IV copd documented 09/24/14 previously followed by Dr Gwenette Greet    11/27/2015 1st  office visit/ Chiron Campione/ transition of care/   GOLD IV on symb 160 2bid / spiriva occ Chief Complaint  Patient presents with  . Follow-up    Former Dr Gwenette Greet pt. He states that his breathing is slowly worsening. He was winded today just walking across our parking lot.   MMRC3 = can't walk 100 yards even at a slow pace at a flat grade s stopping due to sob    No obvious day to day or daytime variability or assoc excess/ purulent sputum or mucus plugs or hemoptysis or cp or chest tightness, subjective wheeze or overt sinus or hb symptoms. No unusual exp hx or h/o childhood pna/ asthma or knowledge of premature birth.  Sleeping ok without nocturnal  or early am exacerbation  of respiratory  c/o's or need for noct saba. Also denies any obvious fluctuation of symptoms with weather or environmental changes or other aggravating or alleviating factors except as outlined above   Current Medications, Allergies, Complete Past Medical History, Past Surgical History, Family History, and Social History were reviewed in [employer] record.  ROS  The following are not active complaints unless bolded sore throat, dysphagia, dental problems, itching, sneezing,  nasal congestion or excess/ purulent secretions, ear ache,   fever, chills, sweats, unintended wt loss, classically pleuritic or exertional cp,  orthopnea pnd or leg swelling, presyncope, palpitations, abdominal pain, anorexia, nausea, vomiting, diarrhea  or change in bowel or bladder habits, change in stools or urine, dysuria,hematuria,  rash, arthralgias, visual complaints, headache, numbness, weakness or ataxia or problems with walking or coordination,  change in mood/affect or memory.             Review of Systems      Objective:   Physical Exam    amb wm nad   Wt Readings from Last 3 Encounters:  11/27/15 178 lb (80.74 kg)  05/01/15 172 lb (78.019 kg)  09/24/14 160 lb (72.576 kg)    Vital signs reviewed    HEENT mild turbinate edema.  Oropharynx no thrush or excess pnd or cobblestoning.  No JVD or cervical adenopathy. Mild accessory muscle hypertrophy. Trachea midline, nl thryroid. Chest was hyperinflated by percussion with diminished breath sounds and moderate increased exp time without wheeze. Hoover sign positive at mid inspiration. Regular rate and rhythm without murmur gallop or rub or increase P2 or edema.  Abd: obese/soft / no hsm, nl excursion. Ext warm without cyanosis or clubbing.         Assessment:

## 2015-11-27 NOTE — Assessment & Plan Note (Signed)
Ambulatory Ox 07/2014:  86% PFT's 08/2014:  FEV1 0.60 (19%), ratio 33, severe airtrapping, DLCO 53% predicted. 11/27/2015  After extensive coaching HFA effectiveness =    75% > try bevespi   He has classic GROUP B symptoms with severe underlying E COPD and not much of an AB component so perfect for trial of bevespi  I had an extended discussion with the patient reviewing all relevant studies completed to date and  lasting 15 to 20 minutes of a 25 minute visit    Each maintenance medication was reviewed in detail including most importantly the difference between maintenance and prns and under what circumstances the prns are to be triggered using an action plan format that is not reflected in the computer generated alphabetically organized AVS.    Please see instructions for details which were reviewed in writing and the patient given a copy highlighting the part that I personally wrote and discussed at today's ov.

## 2015-11-27 NOTE — Patient Instructions (Addendum)
Try bevespi Take 2 puffs first thing in am and then another 2 puffs about 12 hours later- if you like the symbicort better ok to switch back but use the spiriva daily   Work on inhaler technique:  relax and gently blow all the way out then take a nice smooth deep breath back in, triggering the inhaler at same time you start breathing in.  Hold for up to 5 seconds if you can. Blow out thru nose. Rinse and gargle with water when done   Work on weight  Please schedule a follow up office visit in 4 weeks, sooner if needed

## 2015-12-26 ENCOUNTER — Ambulatory Visit: Payer: Medicare Other | Admitting: Internal Medicine

## 2016-01-01 NOTE — Telephone Encounter (Signed)
Per [employer], [place] did not approve Indomethacin.  Per CMM, they sent notice and right for appeal.

## 2016-01-13 ENCOUNTER — Ambulatory Visit (INDEPENDENT_AMBULATORY_CARE_PROVIDER_SITE_OTHER): Payer: Medicare Other | Admitting: Internal Medicine

## 2016-01-13 ENCOUNTER — Encounter: Payer: Self-pay | Admitting: Internal Medicine

## 2016-01-13 ENCOUNTER — Ambulatory Visit (INDEPENDENT_AMBULATORY_CARE_PROVIDER_SITE_OTHER)
Admission: RE | Admit: 2016-01-13 | Discharge: 2016-01-13 | Disposition: A | Discharge status: Home / Self Care | Payer: Medicare Other | Source: Ambulatory Visit | Attending: Internal Medicine | Admitting: Internal Medicine

## 2016-01-13 VITALS — BP 164/80 | HR 88 | Ht 67.0 in | Wt 163.4 lb

## 2016-01-13 DIAGNOSIS — J449 Chronic obstructive pulmonary disease, unspecified: Secondary | ICD-10-CM

## 2016-01-13 DIAGNOSIS — J9611 Chronic respiratory failure with hypoxia: Secondary | ICD-10-CM | POA: Diagnosis not present

## 2016-01-13 MED ORDER — TIOTROPIUM BROMIDE-OLODATEROL 2.5-2.5 MCG/ACT IN AERS: 2.0000 | INHALATION_SPRAY | Freq: Every day | RESPIRATORY_TRACT | Status: DC

## 2016-01-13 NOTE — Patient Instructions (Addendum)
Be sure you keep your 02 sats above 90% with walking using a pulse oximeter   Work on inhaler technique:  relax and gently blow all the way out then take a nice smooth deep breath back in, triggering the inhaler at same time you start breathing in.  Hold for up to 5 seconds if you can. Blow out thru nose. Rinse and gargle with water when done  Plan A = Automatic = stiolto 2 puffs in am and stop spiriva   Plan B = Backup Only use your albuterol(proair) as a rescue medication to be used if you can't catch your breath by resting or doing a relaxed purse lip breathing pattern.  - The less you use it, the better it will work when you need it. - Ok to use the inhaler up to 2 puffs  every 4 hours if you must but call for appointment if use goes up over your usual need - Don't leave home without it !!  (think of it like the spare tire for your car)   Please schedule a follow up visit in 3 months but call sooner if needed

## 2016-01-13 NOTE — Progress Notes (Signed)
Subjective:     Patient ID: George Stokes, male   DOB: 1946/05/01,   MRN: [id number]    Brief patient profile:  70yowm quit smoking 2010 with GOLD IV copd documented 09/24/14 previously followed by Dr Shelle Iron    History of Present Illness  11/27/2015 1st  office visit/ George Stokes/ transition of care/   GOLD IV on symb 160 2bid / spiriva occ Chief Complaint  Patient presents with  . Follow-up    Former Dr Shelle Iron pt. He states that his breathing is slowly worsening. He was winded today just walking across our parking lot.   MMRC3 = can't walk 100 yards even at a slow pace at a flat grade s stopping due to sob   rec Try bevespi Take 2 puffs first thing in am and then another 2 puffs about 12 hours later- if you like the symbicort better ok to switch back but use the spiriva daily  Work on inhaler technique:   Work on [employer]    01/13/2016  f/u ov/George Stokes re: copd IV / 02 dep On symbicort 160 / no spiriva > worse since stopped it ? Formulary issues ? Chief Complaint  Patient presents with  . Follow-up    Breathing is progressively worse since the last visit. He is   sleeping ok s 02  Doe = MMRC4  = sob if tries to leave home or while getting dressed but not using 02 as rec     No obvious day to day or daytime variability or assoc excess/ purulent sputum or mucus plugs or hemoptysis or cp or chest tightness, subjective wheeze or overt sinus or hb symptoms. No unusual exp hx or h/o childhood pna/ asthma or knowledge of premature birth.  Sleeping ok without nocturnal  or early am exacerbation  of respiratory  c/o's or need for noct saba. Also denies any obvious fluctuation of symptoms with weather or environmental changes or other aggravating or alleviating factors except as outlined above   Current Medications, Allergies, Complete Past Medical History, Past Surgical History, Family History, and Social History were reviewed in [employer] record.  ROS  The following are not active  complaints unless bolded sore throat, dysphagia, dental problems, itching, sneezing,  nasal congestion or excess/ purulent secretions, ear ache,   fever, chills, sweats, unintended wt loss, classically pleuritic or exertional cp,  orthopnea pnd or leg swelling, presyncope, palpitations, abdominal pain, anorexia, nausea, vomiting, diarrhea  or change in bowel or bladder habits, change in stools or urine, dysuria,hematuria,  rash, arthralgias, visual complaints, headache, numbness, weakness or ataxia or problems with walking or coordination,  change in mood/affect or memory.                 Objective:   Physical Exam    amb wm nad    01/13/2016      163   11/27/15 178 lb (80.74 kg)  05/01/15 172 lb (78.019 kg)  09/24/14 160 lb (72.576 kg)    Vital signs reviewed - note sats 87% p arrived RA   HEENT mild turbinate edema.  Oropharynx no thrush or excess pnd or cobblestoning.  No JVD or cervical adenopathy. Mild accessory muscle hypertrophy. Trachea midline, nl thryroid. Chest was hyperinflated by percussion with diminished breath sounds and moderate increased exp time without wheeze. Hoover sign positive at mid inspiration. Regular rate and rhythm without murmur gallop or rub or increase P2 or edema.  Abd: obese/soft / no hsm, nl excursion. Ext warm without  cyanosis or clubbing.      CXR PA and Lateral:   01/13/2016 :    I personally reviewed images and agree with radiology impression as follows:    No active cardiopulmonary disease. Stable hyperinflation and bilateral basilar scarring. Stable compression deformities mid thoracic spine. Thoracic spine osteopenia.   Assessment:

## 2016-01-14 NOTE — Assessment & Plan Note (Signed)
01/13/2016 sats 87% RA > ordered best fit for portable 02   Reviewed goal is to keep sats > 90% at all times

## 2016-01-14 NOTE — Progress Notes (Signed)
Quick Note:  lmtcb ______ 

## 2016-01-14 NOTE — Assessment & Plan Note (Addendum)
Ambulatory Ox 07/2014:  86% PFT's 08/2014:  FEV1 0.60 (19%), ratio 33, severe airtrapping, DLCO 53% predicted. 11/27/2015    try bevespi no better so by 01/13/2016 back on symbicort 160 2bid and no spiriva and worse  - 01/13/2016  After extensive coaching HFA effectiveness =    75% > try stiolto x 4 week sample  Very severe copd so clearly should be on LAMA and not clear why he stopped spiriva but getting confused with instructions/ formulary issues  I had an extended discussion with the patient reviewing all relevant studies completed to date and  lasting 15 to 20 minutes of a 25 minute visit on the following ongoing concerns:   Formulary restrictions will be an ongoing challenge for the forseable future and I would be happy to pick an alternative if the pt will first  provide me a list of them but pt  will need to return here for training for any new device that is required eg dpi vs hfa vs respimat.    In meantime we can always provide samples so the patient never runs out of any needed respiratory medications.   For now try stiolto 2 pffs each am and f/u in 4 weeks to regroup in 4 weeks if any problems with rx/symptom control and if not f/u q 3 months is appropriate   Each maintenance medication was reviewed in detail including most importantly the difference between maintenance and as needed and under what circumstances the prns are to be used.  Please see instructions for details which were reviewed in writing and the patient given a copy.

## 2016-01-15 NOTE — Progress Notes (Signed)
Quick Note:  lmtcb ______ 

## 2016-01-20 NOTE — Progress Notes (Signed)
LMTCB

## 2016-01-22 ENCOUNTER — Encounter: Payer: Self-pay | Admitting: *Deleted

## 2016-01-22 NOTE — Progress Notes (Signed)
Mailed letter to pt asking him to call for results

## 2016-02-26 DIAGNOSIS — Z23 Encounter for immunization: Secondary | ICD-10-CM | POA: Diagnosis not present

## 2016-03-11 ENCOUNTER — Telehealth: Payer: Self-pay | Admitting: Internal Medicine

## 2016-03-11 NOTE — Telephone Encounter (Signed)
PA for Stiolto initiated through [place]: [place] information has been submitted to [employer] [place]. [place] will review the request and notify you of the determination decision directly, typically within 3 business days of your submission and once all necessary information is received. You will also receive your request decision electronically. To check for an update later, open the request again from your dashboard. If [place] has not responded within the specified timeframe or if you have any questions about your PA submission, contact [place] directly at [facility]) [phone] or ([place]) [phone].

## 2016-03-11 NOTE — Telephone Encounter (Signed)
This request has been approved. [employer] [place] will send a letter to the member and you regarding this decision. Please see additional information at the bottom of this request.  Called [facility] and spoke with [facility], advised her that PA has been approved.  Nothing further needed.

## 2016-04-14 ENCOUNTER — Ambulatory Visit: Payer: Medicare Other | Admitting: Internal Medicine

## 2016-05-03 ENCOUNTER — Ambulatory Visit (INDEPENDENT_AMBULATORY_CARE_PROVIDER_SITE_OTHER): Payer: Medicare Other | Admitting: Internal Medicine

## 2016-05-03 ENCOUNTER — Encounter: Payer: Self-pay | Admitting: Internal Medicine

## 2016-05-03 VITALS — BP 128/80 | HR 93 | Ht 67.0 in | Wt 168.6 lb

## 2016-05-03 DIAGNOSIS — J449 Chronic obstructive pulmonary disease, unspecified: Secondary | ICD-10-CM | POA: Diagnosis not present

## 2016-05-03 DIAGNOSIS — J9611 Chronic respiratory failure with hypoxia: Secondary | ICD-10-CM

## 2016-05-03 MED ORDER — ALBUTEROL SULFATE HFA 108 (90 BASE) MCG/ACT IN AERS: INHALATION_SPRAY | RESPIRATORY_TRACT | 11 refills | Status: DC

## 2016-05-03 NOTE — Progress Notes (Signed)
Subjective:     Patient ID: George Stokes, male   DOB: 03/28/1946,   MRN: [id number]    Brief patient profile:  70yowm quit smoking 2010 with GOLD IV copd documented 09/24/14 previously followed by Dr Shelle Iron    History of Present Illness  11/27/2015 1st  office visit/ Wert/ transition of care/   GOLD IV on symb 160 2bid / spiriva occ Chief Complaint  Patient presents with  . Follow-up    Former Dr Shelle Iron pt. He states that his breathing is slowly worsening. He was winded today just walking across our parking lot.   MMRC3 = can't walk 100 yards even at a slow pace at a flat grade s stopping due to sob   rec Try bevespi Take 2 puffs first thing in am and then another 2 puffs about 12 hours later- if you like the symbicort better ok to switch back but use the spiriva daily  Work on inhaler technique:   Work on [employer]    01/13/2016  f/u ov/Wert re: copd IV / 02 dep On symbicort 160 / no spiriva > worse since stopped it ? Formulary issues ? Chief Complaint  Patient presents with  . Follow-up    Breathing is progressively worse since the last visit. He is   sleeping ok s 69  Doe = MMRC4  = sob if tries to leave home or while getting dressed but not using 02 as rec   rec Be sure you keep your 02 sats above 90% with walking using a pulse oximeter  Work on inhaler technique:   Plan A = Automatic = stiolto 2 puffs in am and stop spiriva  Plan B = Backup Only use your albuterol(proair) as a rescue medication   05/03/2016  f/u ov/Wert re: COPD IV / 02 dep on symb 160 2bid no stiolto or saba Chief Complaint  Patient presents with  . Follow-up    Breathing is unchanged.   doe actually improved vs prev ov:  MMRC2 = can't walk a nl pace on a flat grade s sob but does fine slow and flat eg HT s 02  Does not have saba at all and by trial and error has found he does just as well on symb 160 2bid and it's cheaper on his plan   No obvious day to day or daytime variability or assoc excess/ purulent  sputum or mucus plugs or hemoptysis or cp or chest tightness, subjective wheeze or overt sinus or hb symptoms. No unusual exp hx or h/o childhood pna/ asthma or knowledge of premature birth.  Sleeping ok without nocturnal  or early am exacerbation  of respiratory  c/o's or need for noct saba. Also denies any obvious fluctuation of symptoms with weather or environmental changes or other aggravating or alleviating factors except as outlined above   Current Medications, Allergies, Complete Past Medical History, Past Surgical History, Family History, and Social History were reviewed in [employer] record.  ROS  The following are not active complaints unless bolded sore throat, dysphagia, dental problems, itching, sneezing,  nasal congestion or excess/ purulent secretions, ear ache,   fever, chills, sweats, unintended wt loss, classically pleuritic or exertional cp,  orthopnea pnd or leg swelling, presyncope, palpitations, abdominal pain, anorexia, nausea, vomiting, diarrhea  or change in bowel or bladder habits, change in stools or urine, dysuria,hematuria,  rash, arthralgias, visual complaints, headache, numbness, weakness or ataxia or problems with walking or coordination,  change in mood/affect or memory.  Objective:   Physical Exam    Disheveled amb wm nad   .05/03/2016      169   01/13/2016      163   11/27/15 178 lb (80.74 kg)  05/01/15 172 lb (78.019 kg)  09/24/14 160 lb (72.576 kg)    Vital signs reviewed - note sats 90% p arrived RA   HEENT mild turbinate edema.  Oropharynx no thrush or excess pnd or cobblestoning.  No JVD or cervical adenopathy. Mild accessory muscle hypertrophy. Trachea midline, nl thryroid. Chest was hyperinflated by percussion with diminished breath sounds and moderate increased exp time without wheeze. Hoover sign positive at mid inspiration. Regular rate and rhythm without murmur gallop or rub or increase P2 or edema.  Abd:  obese/soft / no hsm, nl excursion. Ext warm without cyanosis or clubbing.      CXR PA and Lateral:   01/13/2016 :    I personally reviewed images and agree with radiology impression as follows:    No active cardiopulmonary disease. Stable hyperinflation and bilateral basilar scarring. Stable compression deformities mid thoracic spine. Thoracic spine osteopenia.   Assessment:

## 2016-05-03 NOTE — Assessment & Plan Note (Signed)
Ambulatory Ox 07/2014:  86% PFT's 08/2014:  FEV1 0.60 (19%), ratio 33, severe airtrapping, DLCO 53% predicted. 11/27/2015    try bevespi no better so by 01/13/2016 back on symbicort 160 2bid and no spiriva and worse  - 01/13/2016  After extensive coaching HFA effectiveness =    75% > try stiolto x 4 week sample> preferred symb - 05/03/2016  After extensive coaching HFA effectiveness =    Refused training "it doesn't matter how I use it"/ reused referral to rehab also - 05/03/2016 resumed symb 160 2bid   Nothing more to offer at this point as declines rehab/teaching hfa/ wearing ambulatory 02.  I had an extended discussion with the patient reviewing all relevant studies completed to date and  lasting 15 to 20 minutes of a 25 minute visit    Each maintenance medication was reviewed in detail including most importantly the difference between maintenance and prns and under what circumstances the prns are to be triggered using an action plan format that is not reflected in the computer generated alphabetically organized AVS.    Please see instructions for details which were reviewed in writing and the patient given a copy highlighting the part that I personally wrote and discussed at today's ov.

## 2016-05-03 NOTE — Patient Instructions (Addendum)
Plan A = Automatic = symbicort 160 Take 2 puffs first thing in am and then another 2 puffs about 12 hours later.    Plan B = Backup Only use your albuterol(proair) as a rescue medication to be used if you can't catch your breath by resting or doing a relaxed purse lip breathing pattern.  - The less you use it, the better it will work when you need it. - Ok to use the inhaler up to 2 puffs  every 4 hours if you must but call for appointment if use goes up over your usual need - Don't leave home without it !!  (think of it like the spare tire for your car)   Please schedule a follow up visit in 3 months but call sooner if needed

## 2016-05-03 NOTE — Assessment & Plan Note (Signed)
01/13/2016 sats 87% RA > ordered best fit for portable 02 > declined   Has 02 for noct use but  Not using consistently

## 2016-05-04 ENCOUNTER — Other Ambulatory Visit: Payer: Self-pay

## 2016-05-04 ENCOUNTER — Other Ambulatory Visit: Payer: Self-pay | Admitting: Physician Assistant

## 2016-05-04 MED ORDER — AMLODIPINE BESYLATE 5 MG PO TABS: 5.0000 mg | ORAL_TABLET | Freq: Every day | ORAL | 0 refills | Status: DC

## 2016-05-04 NOTE — Telephone Encounter (Signed)
Called to pharmacy, needs ov

## 2016-05-04 NOTE — Telephone Encounter (Signed)
Last seen 04/2015 Needs ov

## 2016-05-05 NOTE — Telephone Encounter (Signed)
04/2015 last ov and labs

## 2016-06-11 ENCOUNTER — Telehealth: Payer: Self-pay | Admitting: Internal Medicine

## 2016-06-11 DIAGNOSIS — J9611 Chronic respiratory failure with hypoxia: Secondary | ICD-10-CM

## 2016-06-11 DIAGNOSIS — J449 Chronic obstructive pulmonary disease, unspecified: Secondary | ICD-10-CM

## 2016-06-11 NOTE — Telephone Encounter (Signed)
[id number] x1 

## 2016-06-14 NOTE — Telephone Encounter (Signed)
Called and spoke with pt and he stated that he has been waiting for a POC eval to be done by [facility].    Called [facility] and spoke with Jeani Hawking and she stated that they had called the pt 3 times and lmom for him to call back and he never did.  They finally closed the order back in august.  Jeani Hawking stated to put in a new order to them for them to eval the pt for POC.  This has been done. Nothing further is needed.

## 2016-06-14 NOTE — Telephone Encounter (Signed)
Patient is returning phone call [phone].

## 2016-08-16 ENCOUNTER — Ambulatory Visit: Payer: Medicare Other | Admitting: Internal Medicine

## 2016-08-27 ENCOUNTER — Ambulatory Visit: Payer: Medicare Other | Admitting: Internal Medicine

## 2016-09-13 ENCOUNTER — Other Ambulatory Visit: Payer: Self-pay | Admitting: Physician Assistant

## 2016-09-17 ENCOUNTER — Ambulatory Visit (INDEPENDENT_AMBULATORY_CARE_PROVIDER_SITE_OTHER): Payer: Medicare Other | Admitting: Family Medicine

## 2016-09-17 VITALS — BP 193/94 | HR 98 | Temp 98.0°F | Resp 17 | Ht 67.0 in | Wt 165.0 lb

## 2016-09-17 DIAGNOSIS — J449 Chronic obstructive pulmonary disease, unspecified: Secondary | ICD-10-CM | POA: Diagnosis not present

## 2016-09-17 DIAGNOSIS — Z1159 Encounter for screening for other viral diseases: Secondary | ICD-10-CM

## 2016-09-17 DIAGNOSIS — R0902 Hypoxemia: Secondary | ICD-10-CM | POA: Diagnosis not present

## 2016-09-17 DIAGNOSIS — I10 Essential (primary) hypertension: Secondary | ICD-10-CM | POA: Diagnosis not present

## 2016-09-17 DIAGNOSIS — G479 Sleep disorder, unspecified: Secondary | ICD-10-CM

## 2016-09-17 DIAGNOSIS — M1A09X0 Idiopathic chronic gout, multiple sites, without tophus (tophi): Secondary | ICD-10-CM | POA: Diagnosis not present

## 2016-09-17 DIAGNOSIS — Z5181 Encounter for therapeutic drug level monitoring: Secondary | ICD-10-CM | POA: Diagnosis not present

## 2016-09-17 DIAGNOSIS — H9193 Unspecified hearing loss, bilateral: Secondary | ICD-10-CM

## 2016-09-17 MED ORDER — AMLODIPINE BESYLATE 5 MG PO TABS: 5.0000 mg | ORAL_TABLET | Freq: Every day | ORAL | 0 refills | Status: DC

## 2016-09-17 MED ORDER — AMLODIPINE BESYLATE 5 MG PO TABS: 5.0000 mg | ORAL_TABLET | Freq: Every day | ORAL | 3 refills | Status: DC

## 2016-09-17 NOTE — Progress Notes (Signed)
Chief Complaint  Patient presents with  . Medication Refill    Ambien, amlodipine, lotrimine, hydrocodone, indocin    HPI  COPD Patient reports that he sees Pulmonology for his COPD and does not have a primary care He reports that he has not been followed by any provider outside of Pulmonology He uses home oxygen at home except when he is sleeping He reports that he is now at the 2nd setting out of 4 possible settings from the lowest he is at the second lowest  Hypertension- uncontrolled He has not been on his amlodipine for close to a week He states that he has been having normal blood pressures at Pulmonology where he is seen 2-3 times a year He reports that  He denies chest pains or palpitations Denies worsening shortness of breath Denies swelling in his legs He eats a general diet and DOES NOT avoid salty foods So far today he had one tablespoon of peanut butter an hour ago  BP Readings from Last 3 Encounters:  09/17/16 (!) 193/94  05/03/16 128/80  01/13/16 (!) 164/80   Gout Pt reports that he needs refills of indomethacin His last gout attack was January 2018 He stayed home and took his indometacin for gout He reports that there is a little of variability in how often he gets episodes but typically no more than 3 more than 3 episodes a year He does not know the triggers Typically affects his knees Indocin and hydrocodone helps his pain  He uses clotrimazole for skin rash  Sleep Disturbances Reports that he takes about 30 pills of ambien a year as needed He reports that as an adult he has trouble getting to sleep Typically he goes to sleep at 2am  Now he is napping and and sleeps 3-4 hours at a time He has not discussed his [place] use with his Pulmonologist.  He ha snot had a sleep study.  Past Medical History:  Diagnosis Date  . COPD (chronic obstructive pulmonary disease) ([place])   . Gout   . Hypertension     Current Outpatient Prescriptions  Medication  Sig Dispense Refill  . albuterol (PROAIR HFA) 108 (90 Base) MCG/ACT inhaler 2 puffs every 4 hours as needed only  if your can't catch your breath 1 Inhaler 11  . amLODipine (NORVASC) 5 MG tablet Take 1 tablet (5 mg total) by mouth daily. 90 tablet 0  . clotrimazole (LOTRIMIN) 1 % cream Apply 1 application topically as needed.    Marland Kitchen HYDROcodone-acetaminophen (NORCO/VICODIN) 5-325 MG tablet Take 1 tablet by mouth every 6 (six) hours as needed. 30 tablet 0  . indomethacin (INDOCIN) 50 MG capsule Take 1 capsule (50 mg total) by mouth every 8 (eight) hours as needed. 30 capsule 5  . SYMBICORT 160-4.5 MCG/ACT inhaler Inhale 2 puffs into the lungs 2 (two) times daily.  11  . zolpidem (AMBIEN) 5 MG tablet Take 1 tablet daily at bedtime as needed 30 tablet 0   No current facility-administered medications for this visit.     Allergies: No Known Allergies  No past surgical history on file.  Social History   Social History  . Marital status: Single    Spouse name: N/A  . Number of children: N/A  . Years of education: N/A   Occupational History  . retired    Social History Main Topics  . Smoking status: Former Smoker    Packs/day: 2.00    Years: 43.00    Types: Cigarettes  Quit date: 05/03/2009  . Smokeless tobacco: Never Used  . Alcohol use 0.0 oz/week     Comment: 2-4  . Drug use: No  . Sexual activity: No   Other Topics Concern  . None   Social History Narrative  . None    ROS See hpi Objective: Vitals:   09/17/16 1359  BP: (!) 193/94  Pulse: 98  Resp: 17  Temp: 98 F (36.7 C)  TempSrc: Oral  SpO2: (!) 89%  Weight: 165 lb (74.8 kg)  Height: 5\' 7"  (1.702 m)    Physical Exam  Assessment and Plan Tyr was seen today for medication refill.  Diagnoses and all orders for this visit:  COPD with hypoxia ([place])-  Will check hemoglobin Discussed that if he has anemia or polycythemia his oxygen levels may need to be adjusted Discussed that he should take him oxygen  with him when going out and about on errands (there is no oxygen with him today in clinic) -     CBC with Differential/Platelet  Idiopathic chronic gout of multiple sites without tophus- will check renal function No recent flares Discussed triggers  Uncontrolled hypertension- advised pt to cut back on salty foods Refilled amlodipine Will check renal function Will screen for other cardiovascular risk factors -     Comprehensive metabolic panel -     Lipid panel -     Microalbumin, urine -     HCV Ab w/Rflx to Verification  Encounter for medication monitoring  Sleep disturbance- discussed that he should get a sleep study Discussed that patients who have respiratory issues like COPD should not take medications like ambien for sleep Many times the sleep disturbance could be an indication of hypoxia or sleep apnea -     TSH  Bilateral hearing loss, unspecified hearing loss type- offered referral for audiology Pt declined at this time  Other orders -     Discontinue: amLODipine (NORVASC) 5 MG tablet; Take 1 tablet (5 mg total) by mouth daily. -     amLODipine (NORVASC) 5 MG tablet; Take 1 tablet (5 mg total) by mouth daily. -     Interpretation:     Lauryl Seyer A Georg Ang

## 2016-09-17 NOTE — Patient Instructions (Addendum)
Try melatonin for sleep  Also you can try Sleepy Time Tea which has valerian root     IF you received an x-ray today, you will receive an invoice from [facility]. Please contact [facility] at [phone] with questions or concerns regarding your invoice.   IF you received labwork today, you will receive an invoice from [facility]. Please contact [facility] at [phone] with questions or concerns regarding your invoice.   Our billing staff will not be able to assist you with questions regarding bills from these companies.  You will be contacted with the lab results as soon as they are available. The fastest way to get your results is to activate your My Chart account. Instructions are located on the last page of this paperwork. If you have not heard from [place] regarding the results in 2 weeks, please contact this office.    Insomnia Insomnia is a sleep disorder that makes it difficult to fall asleep or to stay asleep. Insomnia can cause tiredness (fatigue), low energy, difficulty concentrating, mood swings, and poor performance at work or school. There are three different ways to classify insomnia:  Difficulty falling asleep.  Difficulty staying asleep.  Waking up too early in the morning. Any type of insomnia can be long-term (chronic) or short-term (acute). Both are common. Short-term insomnia usually lasts for three months or less. Chronic insomnia occurs at least three times a week for longer than three months. What are the causes? Insomnia may be caused by another condition, situation, or substance, such as:  Anxiety.  Certain medicines.  Gastroesophageal reflux disease (GERD) or other gastrointestinal conditions.  Asthma or other breathing conditions.  Restless legs syndrome, sleep apnea, or other sleep disorders.  Chronic pain.  Menopause. This may include hot flashes.  Stroke.  Abuse of alcohol, tobacco, or illegal  drugs.  Depression.  Caffeine.  Neurological disorders, such as Alzheimer disease.  An overactive thyroid (hyperthyroidism). The cause of insomnia may not be known. What increases the risk? Risk factors for insomnia include:  Gender. Women are more commonly affected than men.  Age. Insomnia is more common as you get older.  Stress. This may involve your professional or personal life.  Income. Insomnia is more common in people with lower income.  Lack of exercise.  Irregular work schedule or night shifts.  Traveling between different time zones. What are the signs or symptoms? If you have insomnia, trouble falling asleep or trouble staying asleep is the main symptom. This may lead to other symptoms, such as:  Feeling fatigued.  Feeling nervous about going to sleep.  Not feeling rested in the morning.  Having trouble concentrating.  Feeling irritable, anxious, or depressed. How is this treated? Treatment for insomnia depends on the cause. If your insomnia is caused by an underlying condition, treatment will focus on addressing the condition. Treatment may also include:  Melatonin or Valerian Root over the counter  Counseling or therapy.  Lifestyle adjustments. Follow these instructions at home:  Take medicines only as directed by your health care provider.  Keep regular sleeping and waking hours. Avoid naps.  Keep a sleep diary to help you and your health care provider figure out what could be causing your insomnia. Include:  When you sleep.  When you wake up during the night.  How well you sleep.  How rested you feel the next day.  Any side effects of medicines you are taking.  What you eat and drink.  Make your bedroom a comfortable place  where it is easy to fall asleep:  Put up shades or special blackout curtains to block light from outside.  Use a white noise machine to block noise.  Keep the temperature cool.  Exercise regularly as directed  by your health care provider. Avoid exercising right before bedtime.  Use relaxation techniques to manage stress. Ask your health care provider to suggest some techniques that may work well for you. These may include:  Breathing exercises.  Routines to release muscle tension.  Visualizing peaceful scenes.  Cut back on alcohol, caffeinated beverages, and cigarettes, especially close to bedtime. These can disrupt your sleep.  Do not overeat or eat spicy foods right before bedtime. This can lead to digestive discomfort that can make it hard for you to sleep.  Limit screen use before bedtime. This includes:  Watching TV.  Using your smartphone, tablet, and computer.  Stick to a routine. This can help you fall asleep faster. Try to do a quiet activity, brush your teeth, and go to bed at the same time each night.  Get out of bed if you are still awake after 15 minutes of trying to sleep. Keep the lights down, but try reading or doing a quiet activity. When you feel sleepy, go back to bed.  Make sure that you drive carefully. Avoid driving if you feel very sleepy.  Keep all follow-up appointments as directed by your health care provider. This is important. Contact a health care provider if:  You are tired throughout the day or have trouble in your daily routine due to sleepiness.  You continue to have sleep problems or your sleep problems get worse. Get help right away if:  You have serious thoughts about hurting yourself or someone else. This information is not intended to replace advice given to you by your health care provider. Make sure you discuss any questions you have with your health care provider. Document Released: 06/11/2000 Document Revised: 11/14/2015 Document Reviewed: 03/15/2014 Elsevier Interactive Patient Education  2017 [employer].

## 2016-09-18 LAB — CBC WITH DIFFERENTIAL/PLATELET
BASOS: 1 %
Basophils Absolute: 0.1 10*3/uL (ref 0.0–0.2)
EOS (ABSOLUTE): 0.1 10*3/uL (ref 0.0–0.4)
EOS: 1 %
HEMATOCRIT: 47.4 % (ref 37.5–51.0)
HEMOGLOBIN: 16.2 g/dL (ref 13.0–17.7)
Immature Grans (Abs): 0 10*3/uL (ref 0.0–0.1)
Immature Granulocytes: 0 %
LYMPHS ABS: 1.2 10*3/uL (ref 0.7–3.1)
Lymphs: 13 %
MCH: 31.8 pg (ref 26.6–33.0)
MCHC: 34.2 g/dL (ref 31.5–35.7)
MCV: 93 fL (ref 79–97)
MONOCYTES: 7 %
MONOS ABS: 0.7 10*3/uL (ref 0.1–0.9)
Neutrophils Absolute: 7.6 10*3/uL — ABNORMAL HIGH (ref 1.4–7.0)
Neutrophils: 78 %
Platelets: 241 10*3/uL (ref 150–379)
RBC: 5.1 x10E6/uL (ref 4.14–5.80)
RDW: 13.4 % (ref 12.3–15.4)
WBC: 9.7 10*3/uL (ref 3.4–10.8)

## 2016-09-18 LAB — LIPID PANEL
CHOL/HDL RATIO: 3.5 ratio (ref 0.0–5.0)
Cholesterol, Total: 232 mg/dL — ABNORMAL HIGH (ref 100–199)
HDL: 66 mg/dL (ref >39)
LDL CALC: 143 mg/dL — AB (ref 0–99)
TRIGLYCERIDES: 114 mg/dL (ref 0–149)
VLDL Cholesterol Cal: 23 mg/dL (ref 5–40)

## 2016-09-18 LAB — COMPREHENSIVE METABOLIC PANEL
ALK PHOS: 113 IU/L (ref 39–117)
ALT: 29 IU/L (ref 0–44)
AST: 23 IU/L (ref 0–40)
Albumin/Globulin Ratio: 1.7 (ref 1.2–2.2)
Albumin: 4.5 g/dL (ref 3.6–4.8)
BUN / CREAT RATIO: 15 (ref 10–24)
BUN: 14 mg/dL (ref 8–27)
Bilirubin Total: 0.5 mg/dL (ref 0.0–1.2)
CO2: 25 mmol/L (ref 18–29)
CREATININE: 0.96 mg/dL (ref 0.76–1.27)
Calcium: 9.9 mg/dL (ref 8.6–10.2)
Chloride: 99 mmol/L (ref 96–106)
GFR calc non Af Amer: 81 mL/min/{1.73_m2} (ref >59)
GFR, EST AFRICAN AMERICAN: 94 mL/min/{1.73_m2} (ref >59)
GLOBULIN, TOTAL: 2.6 g/dL (ref 1.5–4.5)
GLUCOSE: 110 mg/dL — AB (ref 65–99)
Potassium: 4.4 mmol/L (ref 3.5–5.2)
Sodium: 144 mmol/L (ref 134–144)
Total Protein: 7.1 g/dL (ref 6.0–8.5)

## 2016-09-18 LAB — HCV INTERPRETATION

## 2016-09-18 LAB — HCV AB W/RFLX TO VERIFICATION

## 2016-09-18 LAB — TSH: TSH: 1.46 u[IU]/mL (ref 0.450–4.500)

## 2016-09-18 LAB — MICROALBUMIN, URINE: Microalbumin, Urine: 55 ug/mL

## 2016-10-20 ENCOUNTER — Ambulatory Visit: Payer: Medicare Other | Admitting: Family Medicine

## 2016-10-28 ENCOUNTER — Encounter: Payer: Self-pay | Admitting: Family Medicine

## 2016-11-12 ENCOUNTER — Telehealth: Payer: Self-pay | Admitting: Internal Medicine

## 2016-11-12 NOTE — Telephone Encounter (Signed)
LMTCB

## 2016-11-15 NOTE — Telephone Encounter (Signed)
lmom tcb x1 to patrice

## 2016-11-16 NOTE — Telephone Encounter (Signed)
Called and spoke to [place] she states that this office note is to old to be used and she will call the patient.

## 2016-11-16 NOTE — Telephone Encounter (Signed)
Will forward this message to [facility] as they handle the referrals and making sure the records were sent to [facility].

## 2016-12-31 ENCOUNTER — Ambulatory Visit (INDEPENDENT_AMBULATORY_CARE_PROVIDER_SITE_OTHER): Payer: Medicare Other | Admitting: Internal Medicine

## 2016-12-31 ENCOUNTER — Encounter: Payer: Self-pay | Admitting: Internal Medicine

## 2016-12-31 VITALS — BP 160/90 | HR 99 | Ht 67.0 in | Wt 159.4 lb

## 2016-12-31 DIAGNOSIS — I10 Essential (primary) hypertension: Secondary | ICD-10-CM

## 2016-12-31 DIAGNOSIS — J449 Chronic obstructive pulmonary disease, unspecified: Secondary | ICD-10-CM

## 2016-12-31 DIAGNOSIS — J9611 Chronic respiratory failure with hypoxia: Secondary | ICD-10-CM | POA: Diagnosis not present

## 2016-12-31 MED ORDER — TIOTROPIUM BROMIDE MONOHYDRATE 2.5 MCG/ACT IN AERS: 2.0000 | INHALATION_SPRAY | Freq: Every day | RESPIRATORY_TRACT | 11 refills | Status: DC

## 2016-12-31 MED ORDER — SYMBICORT 160-4.5 MCG/ACT IN AERO: 2.0000 | INHALATION_SPRAY | Freq: Two times a day (BID) | RESPIRATORY_TRACT | 11 refills | Status: DC

## 2016-12-31 MED ORDER — TIOTROPIUM BROMIDE MONOHYDRATE 2.5 MCG/ACT IN AERS: 2.0000 | INHALATION_SPRAY | Freq: Every day | RESPIRATORY_TRACT | 0 refills | Status: DC

## 2016-12-31 NOTE — Patient Instructions (Addendum)
Plan A = Automatic = symbicort 160 Take 2 puffs first thing in am / spiriva 2 pffs in am only and then another 2 puffs about 12 hours later.    Plan B = Backup Only use your albuterol as a rescue medication to be used if you can't catch your breath by resting or doing a relaxed purse lip breathing pattern.  - The less you use it, the better it will work when you need it. - Ok to use the inhaler up to 2 puffs  every 4 hours if you must but call for appointment if use goes up over your usual need - Don't leave home without it !!  (think of it like the spare tire for your car)    Please schedule a follow up visit in 6  months but call sooner if needed

## 2016-12-31 NOTE — Progress Notes (Signed)
Subjective:     Patient ID: George Stokes, male   DOB: [birth year],   MRN: [id number]    Brief patient profile:  62 yowm quit smoking 2010 with GOLD IV copd documented 09/24/14 previously followed by Dr Shelle Iron    History of Present Illness  11/27/2015 1st  office visit/ George Stokes/ transition of care/   GOLD IV on symb 160 2bid / spiriva occ Chief Complaint  Patient presents with  . Follow-up    Former Dr Shelle Iron pt. He states that his breathing is slowly worsening. He was winded today just walking across our parking lot.   MMRC3 = can't walk 100 yards even at a slow pace at a flat grade s stopping due to sob   rec Try bevespi Take 2 puffs first thing in am and then another 2 puffs about 12 hours later- if you like the symbicort better ok to switch back but use the spiriva daily  Work on inhaler technique:   Work on [employer]    01/13/2016  f/u ov/George Stokes re: copd IV / 02 dep On symbicort 160 / no spiriva > worse since stopped it ? Formulary issues ? Chief Complaint  Patient presents with  . Follow-up    Breathing is progressively worse since the last visit. He is   sleeping ok s 61  Doe = MMRC4  = sob if tries to leave home or while getting dressed but not using 02 as rec   rec Be sure you keep your 02 sats above 90% with walking using a pulse oximeter  Work on inhaler technique:   Plan A = Automatic = stiolto 2 puffs in am and stop spiriva  Plan B = Backup Only use your albuterol(proair) as a rescue medication   05/03/2016  f/u ov/George Stokes re: COPD IV / 02 dep on symb 160 2bid no stiolto or saba Chief Complaint  Patient presents with  . Follow-up    Breathing is unchanged.   doe actually improved vs prev ov:  MMRC2 = can't walk a nl pace on a flat grade s sob but does fine slow and flat eg HT s 02  Does not have saba at all and by trial and error has found he does just as well on symb 160 2bid and it's cheaper on his plan rec No change rx      12/31/2016  f/u ov/George Stokes re:  COPD  GOLD IV on  symb 160 2 bid maint  Chief Complaint  Patient presents with  . Follow-up    Pt states that he feels a little worse than he was at his last appt. Pt states that he feels more SOB, little cough. Denies any chest pain.   doe = MMRC2 = can't walk a nl pace on a flat grade s sob but does fine slow and flat eg HT still ok s 02  Never uses saba  Feels overall more doe s variability  No   assoc excess/ purulent sputum or mucus plugs or hemoptysis or cp or chest tightness, subjective wheeze or overt sinus or hb symptoms. No unusual exp hx or h/o childhood pna/ asthma or knowledge of premature birth.  Sleeping ok without nocturnal  or early am exacerbation  of respiratory  c/o's or need for noct saba. Also denies any obvious fluctuation of symptoms with weather or environmental changes or other aggravating or alleviating factors except as outlined above   Current Medications, Allergies, Complete Past Medical History, Past Surgical History, Family History, and  Social History were reviewed in [employer] record.  ROS  The following are not active complaints unless bolded sore throat, dysphagia, dental problems, itching, sneezing,  nasal congestion or excess/ purulent secretions, ear ache,   fever, chills, sweats, unintended wt loss, classically pleuritic or exertional cp,  orthopnea pnd or leg swelling, presyncope, palpitations, abdominal pain, anorexia, nausea, vomiting, diarrhea  or change in bowel or bladder habits, change in stools or urine, dysuria,hematuria,  rash, arthralgias, visual complaints, headache, numbness, weakness or ataxia or problems with walking or coordination,  change in mood/affect or memory.                      Objective:   Physical Exam  Disheveled amb wm nad   12/31/2016         159  .05/03/2016      169   01/13/2016      163   11/27/15 178 lb (80.74 kg)  05/01/15 172 lb (78.019 kg)  09/24/14 160 lb (72.576 kg)    Vital signs reviewed - note  sats 90% p arrived RA and BP 150/100     HEENT: nl dentition, turbinates bilaterally, and oropharynx. Nl external ear canals without cough reflex   NECK :  without JVD/Nodes/TM/ nl carotid upstrokes bilaterally   LUNGS: no acc muscle use,  Nl contour chest which is clear to A and P bilaterally without cough on insp or exp maneuvers   CV:  RRR  no s3 or murmur or increase in P2, and no edema   ABD:  soft and nontender with nl inspiratory excursion in the supine position. No bruits or organomegaly appreciated, bowel sounds nl  MS:  Nl gait/ ext warm without deformities, calf tenderness, cyanosis or clubbing No obvious joint restrictions   SKIN: warm and dry without lesions    NEURO:  alert, approp, nl sensorium with  no motor or cerebellar deficits apparent.        Assessment:

## 2017-01-02 NOTE — Assessment & Plan Note (Signed)
01/13/2016 sats 87% RA > ordered best fit for portable 02 > declined  - declined amb 02/ rehab referral 12/31/2016

## 2017-01-02 NOTE — Assessment & Plan Note (Addendum)
Ambulatory Ox 07/2014:  86% PFT's 08/2014:  FEV1 0.60 (19%), ratio 33, severe airtrapping, DLCO 53% predicted. 11/27/2015    try bevespi no better so by 01/13/2016 back on symbicort 160 2bid and no spiriva and worse  - 01/13/2016  After extensive coaching HFA effectiveness =    75% > try stiolto x 4 week sample> preferred symb - 05/03/2016 resumed symb 160 2bid   -  12/31/2016  After extensive coaching device effectiveness =    90% with smi so added spriva respimat trial  Worse doe and refuses to consider amb 02 (see separate a/p)  Pt is Group B in terms of symptom/risk and laba/lama therefore appropriate rx at this point but says does better on laba/ics than laba/lama so rec trial of lama added on  To current laba/ics   Each maintenance medication was reviewed in detail including most importantly the difference between maintenance and as needed and under what circumstances the prns are to be used.  Please see AVS for specific  Instructions which are unique to this visit and I personally typed out  which were reviewed in detail in writing with the patient and a copy provided.

## 2017-01-02 NOTE — Assessment & Plan Note (Signed)
Not optimal on norvasc 5 mg daily > Follow up per Primary Care planned  And rec avoid salt in meantime

## 2017-01-03 ENCOUNTER — Telehealth: Payer: Self-pay | Admitting: General Practice

## 2017-01-03 NOTE — Telephone Encounter (Signed)
Pt is needing to get a refill on his gout medication he has an appt tomorrow with dr Wylene Men number [phone]

## 2017-01-04 ENCOUNTER — Ambulatory Visit (INDEPENDENT_AMBULATORY_CARE_PROVIDER_SITE_OTHER): Payer: Medicare Other | Admitting: Emergency Medicine

## 2017-01-04 ENCOUNTER — Encounter: Payer: Self-pay | Admitting: Emergency Medicine

## 2017-01-04 VITALS — BP 188/82 | HR 92 | Temp 97.7°F | Resp 16 | Ht 67.25 in | Wt 160.8 lb

## 2017-01-04 DIAGNOSIS — G479 Sleep disorder, unspecified: Secondary | ICD-10-CM | POA: Insufficient documentation

## 2017-01-04 DIAGNOSIS — R0902 Hypoxemia: Secondary | ICD-10-CM

## 2017-01-04 DIAGNOSIS — M1A09X0 Idiopathic chronic gout, multiple sites, without tophus (tophi): Secondary | ICD-10-CM | POA: Diagnosis not present

## 2017-01-04 DIAGNOSIS — J449 Chronic obstructive pulmonary disease, unspecified: Secondary | ICD-10-CM | POA: Diagnosis not present

## 2017-01-04 DIAGNOSIS — I10 Essential (primary) hypertension: Secondary | ICD-10-CM | POA: Diagnosis not present

## 2017-01-04 MED ORDER — CLOTRIMAZOLE-BETAMETHASONE 1-0.05 % EX CREA: 1.0000 "application " | TOPICAL_CREAM | Freq: Two times a day (BID) | CUTANEOUS | 1 refills | Status: AC

## 2017-01-04 MED ORDER — AMLODIPINE BESYLATE 10 MG PO TABS: 10.0000 mg | ORAL_TABLET | Freq: Every day | ORAL | 3 refills | Status: DC

## 2017-01-04 MED ORDER — INDOMETHACIN 50 MG PO CAPS: 50.0000 mg | ORAL_CAPSULE | Freq: Three times a day (TID) | ORAL | 5 refills | Status: DC | PRN

## 2017-01-04 NOTE — Patient Instructions (Addendum)
pc    IF you received an x-ray today, you will receive an invoice from [facility]. Please contact [facility] at [phone] with questions or concerns regarding your invoice.   IF you received labwork today, you will receive an invoice from [facility]. Please contact [facility] at [phone] with questions or concerns regarding your invoice.   Our billing staff will not be able to assist you with questions regarding bills from these companies.  You will be contacted with the lab results as soon as they are available. The fastest way to get your results is to activate your My Chart account. Instructions are located on the last page of this paperwork. If you have not heard from [place] regarding the results in 2 weeks, please contact this office.     Chronic Obstructive Pulmonary Disease Chronic obstructive pulmonary disease (COPD) is a long-term (chronic) lung problem. When you have COPD, it is hard for air to get in and out of your lungs. The way your lungs work will never return to normal. Usually the condition gets worse over time. There are things you can do to keep yourself as healthy as possible. Your doctor may treat your condition with:  Medicines.  Quitting smoking, if you smoke.  Rehabilitation. This may involve a team of specialists.  Oxygen.  Exercise and changes to your diet.  Lung surgery.  Comfort measures (palliative care).  Follow these instructions at home: Medicines  Take over-the-counter and prescription medicines only as told by your doctor.  Talk to your doctor before taking any cough or allergy medicines. You may need to avoid medicines that cause your lungs to be dry. Lifestyle  If you smoke, stop. Smoking makes the problem worse. If you need help quitting, ask your doctor.  Avoid being around things that make your breathing worse. This may include smoke, chemicals, and fumes.  Stay active, but remember to also rest.  Learn and use  tips on how to relax.  Make sure you get enough sleep. Most adults need at least 7 hours a night.  Eat healthy foods. Eat smaller meals more often. Rest before meals. Controlled breathing  Learn and use tips on how to control your breathing as told by your doctor. Try: ? Breathing in (inhaling) through your nose for 1 second. Then, pucker your lips and breath out (exhale) through your lips for 2 seconds. ? Putting one hand on your belly (abdomen). Breathe in slowly through your nose for 1 second. Your hand on your belly should move out. Pucker your lips and breathe out slowly through your lips. Your hand on your belly should move in as you breathe out. Controlled coughing  Learn and use controlled coughing to clear mucus from your lungs. The steps are: 1. Lean your head a little forward. 2. Breathe in deeply. 3. Try to hold your breath for 3 seconds. 4. Keep your mouth slightly open while coughing 2 times. 5. Spit any mucus out into a tissue. 6. Rest and do the steps again 1 or 2 times as needed. General instructions  Make sure you get all the shots (vaccines) that your doctor recommends. Ask your doctor about a flu shot and a pneumonia shot.  Use oxygen therapy and therapy to help improve your lungs (pulmonary rehabilitation) if told by your doctor. If you need home oxygen therapy, ask your doctor if you should buy a tool to measure your oxygen level (oximeter).  Make a COPD action plan with your doctor. This helps you  know what to do if you feel worse than usual.  Manage any other conditions you have as told by your doctor.  Avoid going outside when it is very hot, cold, or humid.  Avoid people who have a sickness you can catch (contagious).  Keep all follow-up visits as told by your doctor. This is important. Contact a doctor if:  You cough up more mucus than usual.  There is a change in the color or thickness of the mucus.  It is harder to breathe than usual.  Your  breathing is faster than usual.  You have trouble sleeping.  You need to use your medicines more often than usual.  You have trouble doing your normal activities such as getting dressed or walking around the house. Get help right away if:  You have shortness of breath while resting.  You have shortness of breath that stops you from: ? Being able to talk. ? Doing normal activities.  Your chest hurts for longer than 5 minutes.  Your skin color is more blue than usual.  Your pulse oximeter shows that you have low oxygen for longer than 5 minutes.  You have a fever.  You feel too tired to breathe normally. Summary  Chronic obstructive pulmonary disease (COPD) is a long-term lung problem.  The way your lungs work will never return to normal. Usually the condition gets worse over time. There are things you can do to keep yourself as healthy as possible.  Take over-the-counter and prescription medicines only as told by your doctor.  If you smoke, stop. Smoking makes the problem worse. This information is not intended to replace advice given to you by your health care provider. Make sure you discuss any questions you have with your health care provider. Document Released: 12/01/2007 Document Revised: 11/20/2015 Document Reviewed: 02/08/2013 Elsevier Interactive Patient Education  2017 [place]. Insomnia Insomnia is a sleep disorder that makes it difficult to fall asleep or to stay asleep. Insomnia can cause tiredness (fatigue), low energy, difficulty concentrating, mood swings, and poor performance at work or school. There are three different ways to classify insomnia:  Difficulty falling asleep.  Difficulty staying asleep.  Waking up too early in the morning.  Any type of insomnia can be long-term (chronic) or short-term (acute). Both are common. Short-term insomnia usually lasts for three months or less. Chronic insomnia occurs at least three times a week for longer than  three months. What are the causes? Insomnia may be caused by another condition, situation, or substance, such as:  Anxiety.  Certain medicines.  Gastroesophageal reflux disease (GERD) or other gastrointestinal conditions.  Asthma or other breathing conditions.  Restless legs syndrome, sleep apnea, or other sleep disorders.  Chronic pain.  Menopause. This may include hot flashes.  Stroke.  Abuse of alcohol, tobacco, or illegal drugs.  Depression.  Caffeine.  Neurological disorders, such as Alzheimer disease.  An overactive thyroid (hyperthyroidism).  The cause of insomnia may not be known. What increases the risk? Risk factors for insomnia include:  Gender. Women are more commonly affected than men.  Age. Insomnia is more common as you get older.  Stress. This may involve your professional or personal life.  Income. Insomnia is more common in people with lower income.  Lack of exercise.  Irregular work schedule or night shifts.  Traveling between different time zones.  What are the signs or symptoms? If you have insomnia, trouble falling asleep or trouble staying asleep is the main symptom. This may  lead to other symptoms, such as:  Feeling fatigued.  Feeling nervous about going to sleep.  Not feeling rested in the morning.  Having trouble concentrating.  Feeling irritable, anxious, or depressed.  How is this treated? Treatment for insomnia depends on the cause. If your insomnia is caused by an underlying condition, treatment will focus on addressing the condition. Treatment may also include:  Medicines to help you sleep.  Counseling or therapy.  Lifestyle adjustments.  Follow these instructions at home:  Take medicines only as directed by your health care provider.  Keep regular sleeping and waking hours. Avoid naps.  Keep a sleep diary to help you and your health care provider figure out what could be causing your insomnia. Include: ? When  you sleep. ? When you wake up during the night. ? How well you sleep. ? How rested you feel the next day. ? Any side effects of medicines you are taking. ? What you eat and drink.  Make your bedroom a comfortable place where it is easy to fall asleep: ? Put up shades or special blackout curtains to block light from outside. ? Use a white noise machine to block noise. ? Keep the temperature cool.  Exercise regularly as directed by your health care provider. Avoid exercising right before bedtime.  Use relaxation techniques to manage stress. Ask your health care provider to suggest some techniques that may work well for you. These may include: ? Breathing exercises. ? Routines to release muscle tension. ? Visualizing peaceful scenes.  Cut back on alcohol, caffeinated beverages, and cigarettes, especially close to bedtime. These can disrupt your sleep.  Do not overeat or eat spicy foods right before bedtime. This can lead to digestive discomfort that can make it hard for you to sleep.  Limit screen use before bedtime. This includes: ? Watching TV. ? Using your smartphone, tablet, and computer.  Stick to a routine. This can help you fall asleep faster. Try to do a quiet activity, brush your teeth, and go to bed at the same time each night.  Get out of bed if you are still awake after 15 minutes of trying to sleep. Keep the lights down, but try reading or doing a quiet activity. When you feel sleepy, go back to bed.  Make sure that you drive carefully. Avoid driving if you feel very sleepy.  Keep all follow-up appointments as directed by your health care provider. This is important. Contact a health care provider if:  You are tired throughout the day or have trouble in your daily routine due to sleepiness.  You continue to have sleep problems or your sleep problems get worse. Get help right away if:  You have serious thoughts about hurting yourself or someone else. This  information is not intended to replace advice given to you by your health care provider. Make sure you discuss any questions you have with your health care provider. Document Released: 06/11/2000 Document Revised: 11/14/2015 Document Reviewed: 03/15/2014 Elsevier Interactive Patient Education  [employer].

## 2017-01-04 NOTE — Progress Notes (Signed)
George Stokes 71 y.o.   Chief Complaint  Patient presents with  . Medication Refill    AMBIEN,NORVASC, INDOCIN, HYDORCODONE PATIENT HAS OTHER RFs    HISTORY OF PRESENT ILLNESS: This is a 71 y.o. male with h/o advanced COPD Stage 4, requesting medication refills including Norco and Ambien; just saw Pulmonologist 7/6; has h/o chronic insomnia and also gout with HTN. Told to use oxygen 24/7 but he doesn't.  HPI   Prior to Admission medications   Medication Sig Start Date End Date Taking? Authorizing Provider  albuterol (PROAIR HFA) 108 (90 Base) MCG/ACT inhaler 2 puffs every 4 hours as needed only  if your can't catch your breath 05/03/16  Yes Tanda Rockers, MD  clotrimazole (LOTRIMIN) 1 % cream Apply 1 application topically as needed.   Yes [provider]  HYDROcodone-acetaminophen (NORCO/VICODIN) 5-325 MG tablet Take 1 tablet by mouth every 6 (six) hours as needed. 05/01/15  Yes Le, Thao P, DO  indomethacin (INDOCIN) 50 MG capsule Take 1 capsule (50 mg total) by mouth every 8 (eight) hours as needed. 01/04/17  Yes Johaan Ryser, Ines Bloomer, MD  SYMBICORT 160-4.5 MCG/ACT inhaler Inhale 2 puffs into the lungs 2 (two) times daily. 12/31/16  Yes Tanda Rockers, MD  Tiotropium Bromide Monohydrate (SPIRIVA RESPIMAT) 2.5 MCG/ACT AERS Inhale 2 puffs into the lungs daily. 12/31/16  Yes Tanda Rockers, MD  amLODipine (NORVASC) 10 MG tablet Take 1 tablet (10 mg total) by mouth daily. 01/04/17   Horald Pollen, MD  zolpidem (AMBIEN) 5 MG tablet Take 1 tablet daily at bedtime as needed Patient not taking: Reported on 12/31/2016 05/01/15   Glenford Bayley, DO    Not on File  Patient Active Problem List   Diagnosis Date Noted  . Chronic respiratory failure with hypoxia ([place]) 01/13/2016  . COPD GOLD IV  05/03/2014  . Gout 10/30/2011  . Essential hypertension 10/30/2011  . Dyslipidemia 10/30/2011    Past Medical History:  Diagnosis Date  . COPD (chronic obstructive pulmonary disease) ([place])     . Gout   . Hypertension     No past surgical history on file.  Social History   Social History  . Marital status: Single    Spouse name: N/A  . Number of children: N/A  . Years of education: N/A   Occupational History  . retired    Social History Main Topics  . Smoking status: Former Smoker    Packs/day: 2.00    Years: 43.00    Types: Cigarettes    Quit date: 05/03/2009  . Smokeless tobacco: Never Used  . Alcohol use 0.0 oz/week     Comment: 2-4  . Drug use: No  . Sexual activity: No   Other Topics Concern  . Not on file   Social History Narrative  . No narrative on file    Family History  Problem Relation Age of Onset  . Dementia Mother   . Cancer Father   . Gout Father   . Thyroid disease Sister   . Kidney disease Brother   . Gout Brother   . Diabetes Maternal Grandmother   . Stroke Maternal Grandfather      Review of Systems  Constitutional: Negative.  Negative for chills and fever.  HENT: Negative.   Eyes: Negative.   Respiratory: Positive for cough, shortness of breath and wheezing.   Cardiovascular: Negative.  Negative for chest pain and palpitations.  Gastrointestinal: Negative for abdominal pain, diarrhea, nausea and vomiting.  Genitourinary:  Negative.   Musculoskeletal: Positive for joint pain.  Skin: Negative for rash.  Neurological: Negative.  Negative for dizziness and headaches.  Endo/Heme/Allergies: Negative.   Psychiatric/Behavioral: The patient has insomnia.   All other systems reviewed and are negative.  Vitals:   01/04/17 1403  BP: (!) 188/82  Pulse: 92  Resp: 16  Temp: 97.7 F (36.5 C)     Physical Exam  Constitutional: He is oriented to person, place, and time. He appears well-developed.  HENT:  Head: Normocephalic and atraumatic.  Mouth/Throat: Oropharynx is clear and moist. No oropharyngeal exudate.  Eyes: Conjunctivae and EOM are normal. Pupils are equal, round, and reactive to light.  Neck: Normal range of  motion. Neck supple.  Cardiovascular: Normal rate, regular rhythm and normal heart sounds.   Pulmonary/Chest: Effort normal and breath sounds normal.  Abdominal: Soft. There is no tenderness.  Musculoskeletal: Normal range of motion.  Lymphadenopathy:    He has no cervical adenopathy.  Neurological: He is alert and oriented to person, place, and time. No sensory deficit. He exhibits normal muscle tone.  Skin: Skin is warm and dry. Capillary refill takes less than 2 seconds. No rash noted.  Psychiatric: He has a normal mood and affect. His behavior is normal.  Vitals reviewed.    ASSESSMENT & PLAN: George Stokes was seen today for medication refill.  Diagnoses and all orders for this visit:  COPD with hypoxia ([place]) Comments: chronic  Stage 4 very severe COPD by GOLD classification ([place])  Sleep disturbance  Essential hypertension  Idiopathic chronic gout of multiple sites without tophus  Other orders -     amLODipine (NORVASC) 10 MG tablet; Take 1 tablet (10 mg total) by mouth daily. -     Discontinue: indomethacin (INDOCIN) 50 MG capsule; Take 1 capsule (50 mg total) by mouth every 8 (eight) hours as needed. -     indomethacin (INDOCIN) 50 MG capsule; Take 1 capsule (50 mg total) by mouth every 8 (eight) hours as needed. -     clotrimazole-betamethasone (LOTRISONE) cream; Apply 1 application topically 2 (two) times daily.   Advised against using opiates analgesics and not to use Ambien as sleeping aid. Contraindicated with COPD.    Patient Instructions   pc    IF you received an x-ray today, you will receive an invoice from [facility]. Please contact [facility] at [phone] with questions or concerns regarding your invoice.   IF you received labwork today, you will receive an invoice from [facility]. Please contact [facility] at [phone] with questions or concerns regarding your invoice.   Our billing staff will not be able to assist you with questions  regarding bills from these companies.  You will be contacted with the lab results as soon as they are available. The fastest way to get your results is to activate your My Chart account. Instructions are located on the last page of this paperwork. If you have not heard from [place] regarding the results in 2 weeks, please contact this office.     Chronic Obstructive Pulmonary Disease Chronic obstructive pulmonary disease (COPD) is a long-term (chronic) lung problem. When you have COPD, it is hard for air to get in and out of your lungs. The way your lungs work will never return to normal. Usually the condition gets worse over time. There are things you can do to keep yourself as healthy as possible. Your doctor may treat your condition with:  Medicines.  Quitting smoking, if you smoke.  Rehabilitation. This  may involve a team of specialists.  Oxygen.  Exercise and changes to your diet.  Lung surgery.  Comfort measures (palliative care).  Follow these instructions at home: Medicines  Take over-the-counter and prescription medicines only as told by your doctor.  Talk to your doctor before taking any cough or allergy medicines. You may need to avoid medicines that cause your lungs to be dry. Lifestyle  If you smoke, stop. Smoking makes the problem worse. If you need help quitting, ask your doctor.  Avoid being around things that make your breathing worse. This may include smoke, chemicals, and fumes.  Stay active, but remember to also rest.  Learn and use tips on how to relax.  Make sure you get enough sleep. Most adults need at least 7 hours a night.  Eat healthy foods. Eat smaller meals more often. Rest before meals. Controlled breathing  Learn and use tips on how to control your breathing as told by your doctor. Try: ? Breathing in (inhaling) through your nose for 1 second. Then, pucker your lips and breath out (exhale) through your lips for 2 seconds. ? Putting one hand on  your belly (abdomen). Breathe in slowly through your nose for 1 second. Your hand on your belly should move out. Pucker your lips and breathe out slowly through your lips. Your hand on your belly should move in as you breathe out. Controlled coughing  Learn and use controlled coughing to clear mucus from your lungs. The steps are: 1. Lean your head a little forward. 2. Breathe in deeply. 3. Try to hold your breath for 3 seconds. 4. Keep your mouth slightly open while coughing 2 times. 5. Spit any mucus out into a tissue. 6. Rest and do the steps again 1 or 2 times as needed. General instructions  Make sure you get all the shots (vaccines) that your doctor recommends. Ask your doctor about a flu shot and a pneumonia shot.  Use oxygen therapy and therapy to help improve your lungs (pulmonary rehabilitation) if told by your doctor. If you need home oxygen therapy, ask your doctor if you should buy a tool to measure your oxygen level (oximeter).  Make a COPD action plan with your doctor. This helps you know what to do if you feel worse than usual.  Manage any other conditions you have as told by your doctor.  Avoid going outside when it is very hot, cold, or humid.  Avoid people who have a sickness you can catch (contagious).  Keep all follow-up visits as told by your doctor. This is important. Contact a doctor if:  You cough up more mucus than usual.  There is a change in the color or thickness of the mucus.  It is harder to breathe than usual.  Your breathing is faster than usual.  You have trouble sleeping.  You need to use your medicines more often than usual.  You have trouble doing your normal activities such as getting dressed or walking around the house. Get help right away if:  You have shortness of breath while resting.  You have shortness of breath that stops you from: ? Being able to talk. ? Doing normal activities.  Your chest hurts for longer than 5  minutes.  Your skin color is more blue than usual.  Your pulse oximeter shows that you have low oxygen for longer than 5 minutes.  You have a fever.  You feel too tired to breathe normally. Summary  Chronic obstructive pulmonary disease (  COPD) is a long-term lung problem.  The way your lungs work will never return to normal. Usually the condition gets worse over time. There are things you can do to keep yourself as healthy as possible.  Take over-the-counter and prescription medicines only as told by your doctor.  If you smoke, stop. Smoking makes the problem worse. This information is not intended to replace advice given to you by your health care provider. Make sure you discuss any questions you have with your health care provider. Document Released: 12/01/2007 Document Revised: 11/20/2015 Document Reviewed: 02/08/2013 Elsevier Interactive Patient Education  2017 [place]. Insomnia Insomnia is a sleep disorder that makes it difficult to fall asleep or to stay asleep. Insomnia can cause tiredness (fatigue), low energy, difficulty concentrating, mood swings, and poor performance at work or school. There are three different ways to classify insomnia:  Difficulty falling asleep.  Difficulty staying asleep.  Waking up too early in the morning.  Any type of insomnia can be long-term (chronic) or short-term (acute). Both are common. Short-term insomnia usually lasts for three months or less. Chronic insomnia occurs at least three times a week for longer than three months. What are the causes? Insomnia may be caused by another condition, situation, or substance, such as:  Anxiety.  Certain medicines.  Gastroesophageal reflux disease (GERD) or other gastrointestinal conditions.  Asthma or other breathing conditions.  Restless legs syndrome, sleep apnea, or other sleep disorders.  Chronic pain.  Menopause. This may include hot flashes.  Stroke.  Abuse of alcohol,  tobacco, or illegal drugs.  Depression.  Caffeine.  Neurological disorders, such as Alzheimer disease.  An overactive thyroid (hyperthyroidism).  The cause of insomnia may not be known. What increases the risk? Risk factors for insomnia include:  Gender. Women are more commonly affected than men.  Age. Insomnia is more common as you get older.  Stress. This may involve your professional or personal life.  Income. Insomnia is more common in people with lower income.  Lack of exercise.  Irregular work schedule or night shifts.  Traveling between different time zones.  What are the signs or symptoms? If you have insomnia, trouble falling asleep or trouble staying asleep is the main symptom. This may lead to other symptoms, such as:  Feeling fatigued.  Feeling nervous about going to sleep.  Not feeling rested in the morning.  Having trouble concentrating.  Feeling irritable, anxious, or depressed.  How is this treated? Treatment for insomnia depends on the cause. If your insomnia is caused by an underlying condition, treatment will focus on addressing the condition. Treatment may also include:  Medicines to help you sleep.  Counseling or therapy.  Lifestyle adjustments.  Follow these instructions at home:  Take medicines only as directed by your health care provider.  Keep regular sleeping and waking hours. Avoid naps.  Keep a sleep diary to help you and your health care provider figure out what could be causing your insomnia. Include: ? When you sleep. ? When you wake up during the night. ? How well you sleep. ? How rested you feel the next day. ? Any side effects of medicines you are taking. ? What you eat and drink.  Make your bedroom a comfortable place where it is easy to fall asleep: ? Put up shades or special blackout curtains to block light from outside. ? Use a white noise machine to block noise. ? Keep the temperature cool.  Exercise  regularly as directed by your health  care provider. Avoid exercising right before bedtime.  Use relaxation techniques to manage stress. Ask your health care provider to suggest some techniques that may work well for you. These may include: ? Breathing exercises. ? Routines to release muscle tension. ? Visualizing peaceful scenes.  Cut back on alcohol, caffeinated beverages, and cigarettes, especially close to bedtime. These can disrupt your sleep.  Do not overeat or eat spicy foods right before bedtime. This can lead to digestive discomfort that can make it hard for you to sleep.  Limit screen use before bedtime. This includes: ? Watching TV. ? Using your smartphone, tablet, and computer.  Stick to a routine. This can help you fall asleep faster. Try to do a quiet activity, brush your teeth, and go to bed at the same time each night.  Get out of bed if you are still awake after 15 minutes of trying to sleep. Keep the lights down, but try reading or doing a quiet activity. When you feel sleepy, go back to bed.  Make sure that you drive carefully. Avoid driving if you feel very sleepy.  Keep all follow-up appointments as directed by your health care provider. This is important. Contact a health care provider if:  You are tired throughout the day or have trouble in your daily routine due to sleepiness.  You continue to have sleep problems or your sleep problems get worse. Get help right away if:  You have serious thoughts about hurting yourself or someone else. This information is not intended to replace advice given to you by your health care provider. Make sure you discuss any questions you have with your health care provider. Document Released: 06/11/2000 Document Revised: 11/14/2015 Document Reviewed: 03/15/2014 Elsevier Interactive Patient Education  2018 [employer]      Agustina Caroli, MD [facility]

## 2017-01-24 ENCOUNTER — Telehealth: Payer: Self-pay | Admitting: Internal Medicine

## 2017-01-24 MED ORDER — SYMBICORT 160-4.5 MCG/ACT IN AERO: 2.0000 | INHALATION_SPRAY | Freq: Two times a day (BID) | RESPIRATORY_TRACT | 11 refills | Status: AC

## 2017-01-24 NOTE — Telephone Encounter (Signed)
Called and spoke with pt and he stated that he needs a refill of the symbicort. This has been sent to the pharmacy. Nothing further is needed.

## 2017-04-30 DIAGNOSIS — Z23 Encounter for immunization: Secondary | ICD-10-CM | POA: Diagnosis not present

## 2017-05-02 ENCOUNTER — Telehealth: Payer: Self-pay | Admitting: Internal Medicine

## 2017-05-02 NOTE — Telephone Encounter (Signed)
Called and spoke with pt and he stated that he wanted to see if MW would be willing to write him a letter excusing him from jury duty.  Pt is aware that he will need to drop off the jury summons and he will do this in the next day or so.  MW please advise if you are willing to write this letter.  Thanks  Plan A = Automatic = symbicort 160 Take 2 puffs first thing in am / spiriva 2 pffs in am only and then another 2 puffs about 12 hours later.    Plan B = Backup Only use your albuterol as a rescue medication to be used if you can't catch your breath by resting or doing a relaxed purse lip breathing pattern.  - The less you use it, the better it will work when you need it. - Ok to use the inhaler up to 2 puffs  every 4 hours if you must but call for appointment if use goes up over your usual need - Don't leave home without it !!  (think of it like the spare tire for your car)    Please schedule a follow up visit in 6  months but call sooner if needed

## 2017-05-02 NOTE — Telephone Encounter (Signed)
Will forward message to LR to be on the look out for the jury summons.

## 2017-05-02 NOTE — Telephone Encounter (Signed)
Yes that's fine 

## 2017-05-05 NOTE — Telephone Encounter (Signed)
Still not received anything  Will address this once forms are received Closing encounter

## 2017-05-06 ENCOUNTER — Encounter: Payer: Self-pay | Admitting: *Deleted

## 2017-05-06 ENCOUNTER — Telehealth: Payer: Self-pay | Admitting: Internal Medicine

## 2017-05-06 NOTE — Telephone Encounter (Signed)
Pt dropped off the jury summons.  Will forward to LR to make her aware.

## 2017-05-06 NOTE — Telephone Encounter (Signed)
[place] letter done and mailed to the court house  Copies made and mailed to pt in the self addressed envelope that he provided  Called and left detailed msg for the pt to be made aware

## 2017-06-29 ENCOUNTER — Other Ambulatory Visit: Payer: Self-pay

## 2017-06-29 ENCOUNTER — Ambulatory Visit (INDEPENDENT_AMBULATORY_CARE_PROVIDER_SITE_OTHER): Payer: Medicare Other | Admitting: Emergency Medicine

## 2017-06-29 ENCOUNTER — Encounter: Payer: Self-pay | Admitting: Emergency Medicine

## 2017-06-29 VITALS — BP 138/80 | HR 118 | Temp 97.6°F | Resp 20 | Ht 67.25 in | Wt 126.0 lb

## 2017-06-29 DIAGNOSIS — J449 Chronic obstructive pulmonary disease, unspecified: Secondary | ICD-10-CM | POA: Diagnosis not present

## 2017-06-29 DIAGNOSIS — R112 Nausea with vomiting, unspecified: Secondary | ICD-10-CM | POA: Diagnosis not present

## 2017-06-29 DIAGNOSIS — R110 Nausea: Secondary | ICD-10-CM | POA: Diagnosis not present

## 2017-06-29 DIAGNOSIS — I10 Essential (primary) hypertension: Secondary | ICD-10-CM | POA: Diagnosis not present

## 2017-06-29 DIAGNOSIS — R0902 Hypoxemia: Secondary | ICD-10-CM | POA: Diagnosis not present

## 2017-06-29 MED ORDER — ONDANSETRON 4 MG PO TBDP
4.0000 mg | ORAL_TABLET | Freq: Once | ORAL | Status: AC
  Administered 2017-06-29: 4 mg via ORAL

## 2017-06-29 MED ORDER — ONDANSETRON HCL 4 MG PO TABS: 4.0000 mg | ORAL_TABLET | Freq: Three times a day (TID) | ORAL | 0 refills | Status: DC | PRN

## 2017-06-29 NOTE — Progress Notes (Signed)
George Stokes 72 y.o.   Chief Complaint  Patient presents with  . Nausea    x10days   . Emesis    HISTORY OF PRESENT ILLNESS: This is a 72 y.o. male complaining of nausea x 10 days with no clear reason; vomiting not as frequently; no appetite and weight loss approx. 20 lbs.  Emesis   This is a new problem. The current episode started 1 to 4 weeks ago. The problem occurs intermittently. The problem has been waxing and waning. The emesis has an appearance of stomach contents. There has been no fever. Associated symptoms include weight loss. Pertinent negatives include no abdominal pain, chest pain, chills, coughing, diarrhea, dizziness, fever, headaches, myalgias or URI. Risk factors: denies. He has tried nothing for the symptoms.     Prior to Admission medications   Medication Sig Start Date End Date Taking? Authorizing Provider  albuterol (PROAIR HFA) 108 (90 Base) MCG/ACT inhaler 2 puffs every 4 hours as needed only  if your can't catch your breath 05/03/16  Yes Tanda Rockers, MD  amLODipine (NORVASC) 10 MG tablet Take 1 tablet (10 mg total) by mouth daily. 01/04/17  Yes Selam Pietsch, Ines Bloomer, MD  clotrimazole (LOTRIMIN) 1 % cream Apply 1 application topically as needed.    [provider]  clotrimazole-betamethasone (LOTRISONE) cream Apply 1 application topically 2 (two) times daily. Patient not taking: Reported on 06/29/2017 01/04/17   Horald Pollen, MD  HYDROcodone-acetaminophen (NORCO/VICODIN) 5-325 MG tablet Take 1 tablet by mouth every 6 (six) hours as needed. Patient not taking: Reported on 06/29/2017 05/01/15   Le, Thao P, DO  indomethacin (INDOCIN) 50 MG capsule Take 1 capsule (50 mg total) by mouth every 8 (eight) hours as needed. Patient not taking: Reported on 06/29/2017 01/04/17   Horald Pollen, MD  SYMBICORT 160-4.5 MCG/ACT inhaler Inhale 2 puffs into the lungs 2 (two) times daily. Patient not taking: Reported on 06/29/2017 01/24/17   Tanda Rockers, MD    Tiotropium Bromide Monohydrate (SPIRIVA RESPIMAT) 2.5 MCG/ACT AERS Inhale 2 puffs into the lungs daily. Patient not taking: Reported on 06/29/2017 12/31/16   Tanda Rockers, MD  zolpidem (AMBIEN) 5 MG tablet Take 1 tablet daily at bedtime as needed Patient not taking: Reported on 12/31/2016 05/01/15   Rikki Spearing P, DO    No Known Allergies  Patient Active Problem List   Diagnosis Date Noted  . Sleep disturbance 01/04/2017  . Idiopathic chronic gout of multiple sites without tophus 01/04/2017  . Chronic respiratory failure with hypoxia ([place]) 01/13/2016  . Stage 4 very severe COPD by GOLD classification ([place]) 05/03/2014  . Gout 10/30/2011  . Essential hypertension 10/30/2011  . Dyslipidemia 10/30/2011    Past Medical History:  Diagnosis Date  . COPD (chronic obstructive pulmonary disease) ([place])   . Gout   . Hypertension     No past surgical history on file.  Social History   Socioeconomic History  . Marital status: Single    Spouse name: Not on file  . Number of children: Not on file  . Years of education: Not on file  . Highest education level: Not on file  Social Needs  . Financial resource strain: Not on file  . Food insecurity - worry: Not on file  . Food insecurity - inability: Not on file  . Transportation needs - medical: Not on file  . Transportation needs - non-medical: Not on file  Occupational History  . Occupation: retired  Tobacco Use  .  Smoking status: Former Smoker    Packs/day: 2.00    Years: 43.00    Pack years: 86.00    Types: Cigarettes    Last attempt to quit: 05/03/2009    Years since quitting: 8.1  . Smokeless tobacco: Never Used  Substance and Sexual Activity  . Alcohol use: Yes    Alcohol/week: 0.0 oz    Comment: 2-4  . Drug use: No  . Sexual activity: No  Other Topics Concern  . Not on file  Social History Narrative  . Not on file    Family History  Problem Relation Age of Onset  . Dementia Mother   . Cancer Father   . Gout Father    . Thyroid disease Sister   . Kidney disease Brother   . Gout Brother   . Diabetes Maternal Grandmother   . Stroke Maternal Grandfather      Review of Systems  Constitutional: Positive for weight loss. Negative for chills and fever.  HENT: Negative.  Negative for congestion, nosebleeds and sore throat.   Eyes: Negative.   Respiratory: Negative.  Negative for cough and shortness of breath.   Cardiovascular: Negative for chest pain.  Gastrointestinal: Positive for nausea and vomiting. Negative for abdominal pain, blood in stool, diarrhea and melena.  Genitourinary: Negative for dysuria, hematuria and urgency.  Musculoskeletal: Negative for back pain, myalgias and neck pain.  Skin: Negative.  Negative for rash.  Neurological: Positive for weakness. Negative for dizziness and headaches.  Endo/Heme/Allergies: Negative.   All other systems reviewed and are negative.  Vitals:   06/29/17 1444  BP: 138/80  Pulse: (!) 118  Resp: 20  Temp: 97.6 F (36.4 C)  SpO2: (!) 88%   Has chronic hypoxia.  Physical Exam  Constitutional: He is oriented to person, place, and time. He appears well-developed. He has a sickly appearance.  HENT:  Head: Normocephalic and atraumatic.  Nose: Nose normal.  Mouth/Throat: Oropharynx is clear and moist.  Eyes: Conjunctivae and EOM are normal. Pupils are equal, round, and reactive to light.  Neck: Normal range of motion. Neck supple. No JVD present. No thyromegaly present.  Cardiovascular: Normal rate, regular rhythm and normal heart sounds.  Pulmonary/Chest: Effort normal and breath sounds normal.  Abdominal: Soft. Bowel sounds are normal. He exhibits no distension and no mass. There is no tenderness. There is no guarding.  Musculoskeletal: Normal range of motion.  Lymphadenopathy:    He has no cervical adenopathy.  Neurological: He is alert and oriented to person, place, and time.  Skin: Skin is warm and dry. Capillary refill takes less than 2 seconds.  No rash noted.  Psychiatric: He has a normal mood and affect. His behavior is normal.  Vitals reviewed.    ASSESSMENT & PLAN: George Stokes was seen today for nausea and emesis.  Diagnoses and all orders for this visit:  Intractable vomiting with nausea, unspecified vomiting type -     ondansetron (ZOFRAN-ODT) disintegrating tablet 4 mg -     CBC with Differential/Platelet -     Comprehensive metabolic panel -     ondansetron (ZOFRAN) 4 MG tablet; Take 1 tablet (4 mg total) by mouth every 8 (eight) hours as needed for nausea or vomiting.  COPD with hypoxia ([place])  Stage 4 very severe COPD by GOLD classification ([place])  Essential hypertension  Pt instructed to go to ED if condition not better in next 48 hours, earlier if worse. A total of 25 minutes was spent in the room with  the patient, greater than 50% of which was in counseling/coordination of care regarding present condition, DDx, and need for diagnostic work up.  Patient Instructions       IF you received an x-ray today, you will receive an invoice from [facility]. Please contact [facility] at [phone] with questions or concerns regarding your invoice.   IF you received labwork today, you will receive an invoice from [facility]. Please contact [facility] at [phone] with questions or concerns regarding your invoice.   Our billing staff will not be able to assist you with questions regarding bills from these companies.  You will be contacted with the lab results as soon as they are available. The fastest way to get your results is to activate your My Chart account. Instructions are located on the last page of this paperwork. If you have not heard from [place] regarding the results in 2 weeks, please contact this office.     Nausea and Vomiting, Adult Feeling sick to your stomach (nausea) means that your stomach is upset or you feel like you have to throw up (vomit). Feeling more and more sick to your stomach  can lead to throwing up. Throwing up happens when food and liquid from your stomach are thrown up and out the mouth. Throwing up can make you feel weak and cause you to get dehydrated. Dehydration can make you tired and thirsty, make you have a dry mouth, and make it so you pee (urinate) less often. Older adults and people with other diseases or a weak defense system (immune system) are at higher risk for dehydration. If you feel sick to your stomach or if you throw up, it is important to follow instructions from your doctor about how to take care of yourself. Follow these instructions at home: Eating and drinking Follow these instructions as told by your doctor:  Take an oral rehydration solution (ORS). This is a drink that is sold at pharmacies and stores.  Drink clear fluids in small amounts as you are able, such as: ? Water. ? Ice chips. ? Diluted fruit juice. ? Low-calorie sports drinks.  Eat bland, easy-to-digest foods in small amounts as you are able, such as: ? Bananas. ? Applesauce. ? Rice. ? Low-fat (lean) meats. ? Toast. ? Crackers.  Avoid fluids that have a lot of sugar or caffeine in them.  Avoid alcohol.  Avoid spicy or fatty foods.  General instructions  Drink enough fluid to keep your pee (urine) clear or pale yellow.  Wash your hands often. If you cannot use soap and water, use hand sanitizer.  Make sure that all people in your home wash their hands well and often.  Take over-the-counter and prescription medicines only as told by your doctor.  Rest at home while you get better.  Watch your condition for any changes.  Breathe slowly and deeply when you feel sick to your stomach.  Keep all follow-up visits as told by your doctor. This is important. Contact a doctor if:  You have a fever.  You cannot keep fluids down.  Your symptoms get worse.  You have new symptoms.  You feel sick to your stomach for more than two days.  You feel light-headed or  dizzy.  You have a headache.  You have muscle cramps. Get help right away if:  You have pain in your chest, neck, arm, or jaw.  You feel very weak or you pass out (faint).  You throw up again and again.  You  see blood in your throw-up.  Your throw-up looks like black coffee grounds.  You have bloody or black poop (stools) or poop that look like tar.  You have a very bad headache, a stiff neck, or both.  You have a rash.  You have very bad pain, cramping, or bloating in your belly (abdomen).  You have trouble breathing.  You are breathing very quickly.  Your heart is beating very quickly.  Your skin feels cold and clammy.  You feel confused.  You have pain when you pee.  You have signs of dehydration, such as: ? Dark pee, hardly any pee, or no pee. ? Cracked lips. ? Dry mouth. ? Sunken eyes. ? Sleepiness. ? Weakness. These symptoms may be an emergency. Do not wait to see if the symptoms will go away. Get medical help right away. Call your local emergency services (911 in the [place] Do not drive yourself to the hospital. This information is not intended to replace advice given to you by your health care provider. Make sure you discuss any questions you have with your health care provider. Document Released: 12/01/2007 Document Revised: 01/02/2016 Document Reviewed: 02/18/2015 Elsevier Interactive Patient Education  2018 [employer]vier Inc.      Agustina Caroli, MD [facility]

## 2017-06-29 NOTE — Patient Instructions (Addendum)
IF you received an x-ray today, you will receive an invoice from [facility]. Please contact [facility] at [phone] with questions or concerns regarding your invoice.   IF you received labwork today, you will receive an invoice from [place]. Please contact [facility] at [phone] with questions or concerns regarding your invoice.   Our billing staff will not be able to assist you with questions regarding bills from these companies.  You will be contacted with the lab results as soon as they are available. The fastest way to get your results is to activate your My Chart account. Instructions are located on the last page of this paperwork. If you have not heard from [place] regarding the results in 2 weeks, please contact this office.     Nausea and Vomiting, Adult Feeling sick to your stomach (nausea) means that your stomach is upset or you feel like you have to throw up (vomit). Feeling more and more sick to your stomach can lead to throwing up. Throwing up happens when food and liquid from your stomach are thrown up and out the mouth. Throwing up can make you feel weak and cause you to get dehydrated. Dehydration can make you tired and thirsty, make you have a dry mouth, and make it so you pee (urinate) less often. Older adults and people with other diseases or a weak defense system (immune system) are at higher risk for dehydration. If you feel sick to your stomach or if you throw up, it is important to follow instructions from your doctor about how to take care of yourself. Follow these instructions at home: Eating and drinking Follow these instructions as told by your doctor:  Take an oral rehydration solution (ORS). This is a drink that is sold at pharmacies and stores.  Drink clear fluids in small amounts as you are able, such as: ? Water. ? Ice chips. ? Diluted fruit juice. ? Low-calorie sports drinks.  Eat bland, easy-to-digest foods in small amounts as you  are able, such as: ? Bananas. ? Applesauce. ? Rice. ? Low-fat (lean) meats. ? Toast. ? Crackers.  Avoid fluids that have a lot of sugar or caffeine in them.  Avoid alcohol.  Avoid spicy or fatty foods.  General instructions  Drink enough fluid to keep your pee (urine) clear or pale yellow.  Wash your hands often. If you cannot use soap and water, use hand sanitizer.  Make sure that all people in your home wash their hands well and often.  Take over-the-counter and prescription medicines only as told by your doctor.  Rest at home while you get better.  Watch your condition for any changes.  Breathe slowly and deeply when you feel sick to your stomach.  Keep all follow-up visits as told by your doctor. This is important. Contact a doctor if:  You have a fever.  You cannot keep fluids down.  Your symptoms get worse.  You have new symptoms.  You feel sick to your stomach for more than two days.  You feel light-headed or dizzy.  You have a headache.  You have muscle cramps. Get help right away if:  You have pain in your chest, neck, arm, or jaw.  You feel very weak or you pass out (faint).  You throw up again and again.  You see blood in your throw-up.  Your throw-up looks like black coffee grounds.  You have bloody or black poop (stools) or poop that look like tar.  You  have a very bad headache, a stiff neck, or both.  You have a rash.  You have very bad pain, cramping, or bloating in your belly (abdomen).  You have trouble breathing.  You are breathing very quickly.  Your heart is beating very quickly.  Your skin feels cold and clammy.  You feel confused.  You have pain when you pee.  You have signs of dehydration, such as: ? Dark pee, hardly any pee, or no pee. ? Cracked lips. ? Dry mouth. ? Sunken eyes. ? Sleepiness. ? Weakness. These symptoms may be an emergency. Do not wait to see if the symptoms will go away. Get medical help  right away. Call your local emergency services (911 in the [place] Do not drive yourself to the hospital. This information is not intended to replace advice given to you by your health care provider. Make sure you discuss any questions you have with your health care provider. Document Released: 12/01/2007 Document Revised: 01/02/2016 Document Reviewed: 02/18/2015 Elsevier Interactive Patient Education  2018 [employer].

## 2017-06-30 LAB — COMPREHENSIVE METABOLIC PANEL
A/G RATIO: 1.6 (ref 1.2–2.2)
ALT: 21 IU/L (ref 0–44)
AST: 25 IU/L (ref 0–40)
Albumin: 4.5 g/dL (ref 3.6–4.8)
Alkaline Phosphatase: 119 IU/L — ABNORMAL HIGH (ref 39–117)
BILIRUBIN TOTAL: 0.6 mg/dL (ref 0.0–1.2)
BUN/Creatinine Ratio: 19 (ref 10–24)
BUN: 28 mg/dL — AB (ref 8–27)
CHLORIDE: 94 mmol/L — AB (ref 96–106)
CO2: 19 mmol/L — ABNORMAL LOW (ref 20–29)
Calcium: 10.9 mg/dL — ABNORMAL HIGH (ref 8.6–10.2)
Creatinine, Ser: 1.44 mg/dL — ABNORMAL HIGH (ref 0.76–1.27)
GFR calc Af Amer: 57 mL/min/{1.73_m2} — ABNORMAL LOW (ref >59)
GFR calc non Af Amer: 49 mL/min/{1.73_m2} — ABNORMAL LOW (ref >59)
GLUCOSE: 88 mg/dL (ref 65–99)
Globulin, Total: 2.8 g/dL (ref 1.5–4.5)
POTASSIUM: 4.8 mmol/L (ref 3.5–5.2)
Sodium: 143 mmol/L (ref 134–144)
Total Protein: 7.3 g/dL (ref 6.0–8.5)

## 2017-06-30 LAB — CBC WITH DIFFERENTIAL/PLATELET
BASOS ABS: 0 10*3/uL (ref 0.0–0.2)
BASOS: 0 %
EOS (ABSOLUTE): 0 10*3/uL (ref 0.0–0.4)
Eos: 0 %
Hematocrit: 52.7 % — ABNORMAL HIGH (ref 37.5–51.0)
Hemoglobin: 17.8 g/dL — ABNORMAL HIGH (ref 13.0–17.7)
IMMATURE GRANULOCYTES: 0 %
Immature Grans (Abs): 0 10*3/uL (ref 0.0–0.1)
Lymphocytes Absolute: 1 10*3/uL (ref 0.7–3.1)
Lymphs: 5 %
MCH: 31 pg (ref 26.6–33.0)
MCHC: 33.8 g/dL (ref 31.5–35.7)
MCV: 92 fL (ref 79–97)
MONOS ABS: 1.1 10*3/uL — AB (ref 0.1–0.9)
Monocytes: 6 %
NEUTROS ABS: 16.5 10*3/uL — AB (ref 1.4–7.0)
NEUTROS PCT: 89 %
PLATELETS: 299 10*3/uL (ref 150–379)
RBC: 5.75 x10E6/uL (ref 4.14–5.80)
RDW: 14.7 % (ref 12.3–15.4)
WBC: 18.8 10*3/uL — ABNORMAL HIGH (ref 3.4–10.8)

## 2017-07-01 ENCOUNTER — Ambulatory Visit: Payer: Self-pay | Admitting: *Deleted

## 2017-07-01 NOTE — Telephone Encounter (Signed)
Patient reports he is vomiting since his visit- the medication is not working. He reports Wednesday evening he vomited 2 times after he took the first dose.Thursday he took 3 pills during the day and vomited 5 times. Today he has not taken any medication and he has vomited 6 times. He would like to have a new antiemetic to try- the Zofran is not working to control the nausea and vomiting. He states he can take 1-2 small sips and he is hardly eating at all. He denies weakness- he states he feels normal right now. He would like to try another medication before his follow up appointment on 1/9-Wednesday.He uses the [facility] and can be reached at [phone]

## 2017-07-02 NOTE — Progress Notes (Signed)
Spoke to patient with message concerning CBC results and he needs further evaluation in the ED. The patient stated he needs something for regurgitation, I advised him this could be because of possible dehydration and infection. The  patient called back for to write down the message to tell ED, I told him [facility] has this information in Epic.

## 2017-07-04 ENCOUNTER — Ambulatory Visit: Payer: Medicare Other | Admitting: Internal Medicine

## 2017-07-04 NOTE — Telephone Encounter (Signed)
George Stokes had abnormal lab results noted last Friday. I recommended then he should go to ED. He's still sick so I still recommend he goes to ED for further testing, IV hydration, and evaluation. Thanks.

## 2017-07-05 NOTE — Telephone Encounter (Signed)
See note below

## 2017-07-06 ENCOUNTER — Ambulatory Visit: Payer: Medicare Other | Admitting: Emergency Medicine

## 2017-07-06 ENCOUNTER — Telehealth: Payer: Self-pay | Admitting: Emergency Medicine

## 2017-07-06 NOTE — Telephone Encounter (Signed)
Pt called wanting to know if the ED doctor would be able to see his labs from Jun 29, 2017. He states that is going to the emergency department this afternoon and wanted to make sure that they would be able to see the results. He states that he is still vomiting. Pt was reassured that the doctors should be able to see the labs.

## 2017-07-07 ENCOUNTER — Other Ambulatory Visit: Payer: Self-pay

## 2017-07-07 ENCOUNTER — Inpatient Hospital Stay (HOSPITAL_COMMUNITY)
Admission: EM | Admit: 2017-07-07 | Discharge: 2017-08-02 | DRG: 356 | Disposition: A | Discharge status: Skilled Nursing Facility | Payer: Medicare Other | Attending: Family Medicine | Admitting: Family Medicine

## 2017-07-07 ENCOUNTER — Encounter (HOSPITAL_COMMUNITY): Payer: Self-pay | Admitting: Emergency Medicine

## 2017-07-07 ENCOUNTER — Emergency Department (HOSPITAL_COMMUNITY): Payer: Medicare Other

## 2017-07-07 DIAGNOSIS — J690 Pneumonitis due to inhalation of food and vomit: Secondary | ICD-10-CM | POA: Diagnosis not present

## 2017-07-07 DIAGNOSIS — E872 Acidosis: Secondary | ICD-10-CM | POA: Diagnosis not present

## 2017-07-07 DIAGNOSIS — C155 Malignant neoplasm of lower third of esophagus: Secondary | ICD-10-CM | POA: Diagnosis not present

## 2017-07-07 DIAGNOSIS — C160 Malignant neoplasm of cardia: Secondary | ICD-10-CM

## 2017-07-07 DIAGNOSIS — I952 Hypotension due to drugs: Secondary | ICD-10-CM | POA: Diagnosis not present

## 2017-07-07 DIAGNOSIS — E876 Hypokalemia: Secondary | ICD-10-CM | POA: Diagnosis present

## 2017-07-07 DIAGNOSIS — F1010 Alcohol abuse, uncomplicated: Secondary | ICD-10-CM | POA: Diagnosis present

## 2017-07-07 DIAGNOSIS — E43 Unspecified severe protein-calorie malnutrition: Secondary | ICD-10-CM | POA: Diagnosis present

## 2017-07-07 DIAGNOSIS — Z8349 Family history of other endocrine, nutritional and metabolic diseases: Secondary | ICD-10-CM

## 2017-07-07 DIAGNOSIS — R634 Abnormal weight loss: Secondary | ICD-10-CM | POA: Diagnosis not present

## 2017-07-07 DIAGNOSIS — R0603 Acute respiratory distress: Secondary | ICD-10-CM

## 2017-07-07 DIAGNOSIS — I480 Paroxysmal atrial fibrillation: Secondary | ICD-10-CM | POA: Diagnosis not present

## 2017-07-07 DIAGNOSIS — J9600 Acute respiratory failure, unspecified whether with hypoxia or hypercapnia: Secondary | ICD-10-CM

## 2017-07-07 DIAGNOSIS — I10 Essential (primary) hypertension: Secondary | ICD-10-CM | POA: Diagnosis present

## 2017-07-07 DIAGNOSIS — K222 Esophageal obstruction: Secondary | ICD-10-CM

## 2017-07-07 DIAGNOSIS — Z9911 Dependence on respirator [ventilator] status: Secondary | ICD-10-CM

## 2017-07-07 DIAGNOSIS — Z79899 Other long term (current) drug therapy: Secondary | ICD-10-CM

## 2017-07-07 DIAGNOSIS — R0902 Hypoxemia: Secondary | ICD-10-CM

## 2017-07-07 DIAGNOSIS — Z7951 Long term (current) use of inhaled steroids: Secondary | ICD-10-CM

## 2017-07-07 DIAGNOSIS — E860 Dehydration: Secondary | ICD-10-CM | POA: Diagnosis present

## 2017-07-07 DIAGNOSIS — T17590A Other foreign object in bronchus causing asphyxiation, initial encounter: Secondary | ICD-10-CM | POA: Diagnosis present

## 2017-07-07 DIAGNOSIS — J9611 Chronic respiratory failure with hypoxia: Secondary | ICD-10-CM | POA: Diagnosis present

## 2017-07-07 DIAGNOSIS — D72829 Elevated white blood cell count, unspecified: Secondary | ICD-10-CM

## 2017-07-07 DIAGNOSIS — L8990 Pressure ulcer of unspecified site, unspecified stage: Secondary | ICD-10-CM

## 2017-07-07 DIAGNOSIS — N179 Acute kidney failure, unspecified: Secondary | ICD-10-CM | POA: Diagnosis not present

## 2017-07-07 DIAGNOSIS — R1310 Dysphagia, unspecified: Secondary | ICD-10-CM

## 2017-07-07 DIAGNOSIS — J918 Pleural effusion in other conditions classified elsewhere: Secondary | ICD-10-CM | POA: Diagnosis not present

## 2017-07-07 DIAGNOSIS — F05 Delirium due to known physiological condition: Secondary | ICD-10-CM | POA: Diagnosis not present

## 2017-07-07 DIAGNOSIS — R188 Other ascites: Secondary | ICD-10-CM | POA: Diagnosis not present

## 2017-07-07 DIAGNOSIS — J9602 Acute respiratory failure with hypercapnia: Secondary | ICD-10-CM | POA: Diagnosis not present

## 2017-07-07 DIAGNOSIS — Z800 Family history of malignant neoplasm of digestive organs: Secondary | ICD-10-CM

## 2017-07-07 DIAGNOSIS — K567 Ileus, unspecified: Secondary | ICD-10-CM

## 2017-07-07 DIAGNOSIS — J439 Emphysema, unspecified: Secondary | ICD-10-CM | POA: Diagnosis present

## 2017-07-07 DIAGNOSIS — Z9119 Patient's noncompliance with other medical treatment and regimen: Secondary | ICD-10-CM

## 2017-07-07 DIAGNOSIS — R112 Nausea with vomiting, unspecified: Secondary | ICD-10-CM | POA: Diagnosis present

## 2017-07-07 DIAGNOSIS — D649 Anemia, unspecified: Secondary | ICD-10-CM | POA: Diagnosis present

## 2017-07-07 DIAGNOSIS — R0981 Nasal congestion: Secondary | ICD-10-CM | POA: Diagnosis not present

## 2017-07-07 DIAGNOSIS — M109 Gout, unspecified: Secondary | ICD-10-CM

## 2017-07-07 DIAGNOSIS — R000 Tachycardia, unspecified: Secondary | ICD-10-CM | POA: Diagnosis not present

## 2017-07-07 DIAGNOSIS — R7989 Other specified abnormal findings of blood chemistry: Secondary | ICD-10-CM

## 2017-07-07 DIAGNOSIS — Y838 Other surgical procedures as the cause of abnormal reaction of the patient, or of later complication, without mention of misadventure at the time of the procedure: Secondary | ICD-10-CM | POA: Diagnosis not present

## 2017-07-07 DIAGNOSIS — R1314 Dysphagia, pharyngoesophageal phase: Secondary | ICD-10-CM | POA: Diagnosis present

## 2017-07-07 DIAGNOSIS — Z681 Body mass index (BMI) 19 or less, adult: Secondary | ICD-10-CM

## 2017-07-07 DIAGNOSIS — C159 Malignant neoplasm of esophagus, unspecified: Secondary | ICD-10-CM

## 2017-07-07 DIAGNOSIS — E46 Unspecified protein-calorie malnutrition: Secondary | ICD-10-CM

## 2017-07-07 DIAGNOSIS — E785 Hyperlipidemia, unspecified: Secondary | ICD-10-CM | POA: Diagnosis present

## 2017-07-07 DIAGNOSIS — J9621 Acute and chronic respiratory failure with hypoxia: Secondary | ICD-10-CM | POA: Diagnosis present

## 2017-07-07 DIAGNOSIS — R0602 Shortness of breath: Secondary | ICD-10-CM

## 2017-07-07 DIAGNOSIS — Z87891 Personal history of nicotine dependence: Secondary | ICD-10-CM

## 2017-07-07 DIAGNOSIS — A419 Sepsis, unspecified organism: Secondary | ICD-10-CM | POA: Diagnosis not present

## 2017-07-07 DIAGNOSIS — K56609 Unspecified intestinal obstruction, unspecified as to partial versus complete obstruction: Secondary | ICD-10-CM

## 2017-07-07 DIAGNOSIS — K224 Dyskinesia of esophagus: Secondary | ICD-10-CM | POA: Diagnosis present

## 2017-07-07 DIAGNOSIS — R64 Cachexia: Secondary | ICD-10-CM | POA: Diagnosis present

## 2017-07-07 DIAGNOSIS — J181 Lobar pneumonia, unspecified organism: Secondary | ICD-10-CM

## 2017-07-07 DIAGNOSIS — T426X5A Adverse effect of other antiepileptic and sedative-hypnotic drugs, initial encounter: Secondary | ICD-10-CM | POA: Diagnosis not present

## 2017-07-07 DIAGNOSIS — J9601 Acute respiratory failure with hypoxia: Secondary | ICD-10-CM

## 2017-07-07 DIAGNOSIS — J189 Pneumonia, unspecified organism: Secondary | ICD-10-CM | POA: Diagnosis present

## 2017-07-07 DIAGNOSIS — Z4659 Encounter for fitting and adjustment of other gastrointestinal appliance and device: Secondary | ICD-10-CM

## 2017-07-07 DIAGNOSIS — J95821 Acute postprocedural respiratory failure: Secondary | ICD-10-CM | POA: Diagnosis not present

## 2017-07-07 DIAGNOSIS — J90 Pleural effusion, not elsewhere classified: Secondary | ICD-10-CM

## 2017-07-07 DIAGNOSIS — K9413 Enterostomy malfunction: Secondary | ICD-10-CM

## 2017-07-07 DIAGNOSIS — Z978 Presence of other specified devices: Secondary | ICD-10-CM

## 2017-07-07 DIAGNOSIS — Z9981 Dependence on supplemental oxygen: Secondary | ICD-10-CM

## 2017-07-07 DIAGNOSIS — IMO0002 Reserved for concepts with insufficient information to code with codable children: Secondary | ICD-10-CM

## 2017-07-07 DIAGNOSIS — J9811 Atelectasis: Secondary | ICD-10-CM

## 2017-07-07 DIAGNOSIS — Z9889 Other specified postprocedural states: Secondary | ICD-10-CM

## 2017-07-07 DIAGNOSIS — R0600 Dyspnea, unspecified: Secondary | ICD-10-CM

## 2017-07-07 DIAGNOSIS — N281 Cyst of kidney, acquired: Secondary | ICD-10-CM | POA: Diagnosis present

## 2017-07-07 DIAGNOSIS — J449 Chronic obstructive pulmonary disease, unspecified: Secondary | ICD-10-CM | POA: Diagnosis present

## 2017-07-07 HISTORY — DX: Other specified disease of esophagus: K22.89

## 2017-07-07 HISTORY — DX: Pneumonia, unspecified organism: J18.9

## 2017-07-07 HISTORY — DX: Cardiac murmur, unspecified: R01.1

## 2017-07-07 HISTORY — DX: Dyspnea, unspecified: R06.00

## 2017-07-07 HISTORY — DX: Dependence on supplemental oxygen: Z99.81

## 2017-07-07 HISTORY — DX: Other specified diseases of esophagus: K22.8

## 2017-07-07 HISTORY — DX: Malignant (primary) neoplasm, unspecified: C80.1

## 2017-07-07 LAB — CBC
HEMATOCRIT: 51 % (ref 39.0–52.0)
HEMOGLOBIN: 17.6 g/dL — AB (ref 13.0–17.0)
MCH: 30.4 pg (ref 26.0–34.0)
MCHC: 34.5 g/dL (ref 30.0–36.0)
MCV: 88.1 fL (ref 78.0–100.0)
Platelets: 266 10*3/uL (ref 150–400)
RBC: 5.79 MIL/uL (ref 4.22–5.81)
RDW: 14.2 % (ref 11.5–15.5)
WBC: 17.3 10*3/uL — AB (ref 4.0–10.5)

## 2017-07-07 LAB — COMPREHENSIVE METABOLIC PANEL
ALBUMIN: 3.9 g/dL (ref 3.5–5.0)
ALT: 22 U/L (ref 17–63)
ANION GAP: 16 — AB (ref 5–15)
AST: 26 U/L (ref 15–41)
Alkaline Phosphatase: 96 U/L (ref 38–126)
BILIRUBIN TOTAL: 1.4 mg/dL — AB (ref 0.3–1.2)
BUN: 47 mg/dL — ABNORMAL HIGH (ref 6–20)
CO2: 29 mmol/L (ref 22–32)
Calcium: 9.8 mg/dL (ref 8.9–10.3)
Chloride: 92 mmol/L — ABNORMAL LOW (ref 101–111)
Creatinine, Ser: 2.08 mg/dL — ABNORMAL HIGH (ref 0.61–1.24)
GFR calc Af Amer: 36 mL/min — ABNORMAL LOW (ref >60)
GFR calc non Af Amer: 31 mL/min — ABNORMAL LOW (ref >60)
GLUCOSE: 104 mg/dL — AB (ref 65–99)
POTASSIUM: 4.4 mmol/L (ref 3.5–5.1)
SODIUM: 137 mmol/L (ref 135–145)
Total Protein: 7.1 g/dL (ref 6.5–8.1)

## 2017-07-07 LAB — URINALYSIS, ROUTINE W REFLEX MICROSCOPIC
BILIRUBIN URINE: NEGATIVE
GLUCOSE, UA: NEGATIVE mg/dL
Hgb urine dipstick: NEGATIVE
KETONES UR: 20 mg/dL — AB
NITRITE: NEGATIVE
PROTEIN: NEGATIVE mg/dL
Specific Gravity, Urine: 1.018 (ref 1.005–1.030)
pH: 5 (ref 5.0–8.0)

## 2017-07-07 LAB — LIPASE, BLOOD: Lipase: 52 U/L — ABNORMAL HIGH (ref 11–51)

## 2017-07-07 MED ORDER — SODIUM CHLORIDE 0.9 % IV BOLUS (SEPSIS)
1000.0000 mL | Freq: Once | INTRAVENOUS | Status: AC
  Administered 2017-07-07: 1000 mL via INTRAVENOUS

## 2017-07-07 MED ORDER — PROMETHAZINE HCL 25 MG/ML IJ SOLN
12.5000 mg | Freq: Once | INTRAMUSCULAR | Status: AC
  Administered 2017-07-07: 12.5 mg via INTRAVENOUS
  Filled 2017-07-07: qty 1

## 2017-07-07 NOTE — ED Provider Notes (Signed)
[facility] DEPT Provider Note   CSN: [id number] Arrival date & time: 07/07/17  1447      History   Chief Complaint Chief Complaint  Patient presents with  . Emesis    HPI George Stokes is a 72 y.o. male.  HPI 72 year old male with a history of COPD with hypoxia and chronic oxygen use as well as hypertension presents with vomiting for 10 days.  Patient states that it only occurs when he eats or drinks.  Most times he eats it comes up within seconds or minutes.  Sometimes he can sit up without vomiting but other times with sipping he will vomit.  He denies any type of pain including no chest pain or abdominal pain.  No abdominal distention.  No back pain or headache.  There is no hematemesis.  He has not had any diarrhea and has only had one bowel movement in the last 1 week.  He has had less and less appetite and at this point has no appetite.  He is lost around 20 pounds over the last 6 months.  He states he chronically feels short of breath from the COPD.  He has oxygen at home but does not always wear it.  He states that when he eats food and drinks water, it feels like he goes down into his stomach and then he vomits.  He feels like he is passing it through his esophagus.  Past Medical History:  Diagnosis Date  . COPD (chronic obstructive pulmonary disease) ([place])   . Gout   . Hypertension     Patient Active Problem List   Diagnosis Date Noted  . Intractable vomiting with nausea 06/29/2017  . Sleep disturbance 01/04/2017  . Idiopathic chronic gout of multiple sites without tophus 01/04/2017  . Chronic respiratory failure with hypoxia ([place]) 01/13/2016  . COPD with hypoxia ([place]) 05/03/2014  . Gout 10/30/2011  . Essential hypertension 10/30/2011  . Dyslipidemia 10/30/2011    History reviewed. No pertinent surgical history.     Home Medications    Prior to Admission medications   Medication Sig Start Date End Date Taking? Authorizing  Provider  albuterol (PROAIR HFA) 108 (90 Base) MCG/ACT inhaler 2 puffs every 4 hours as needed only  if your can't catch your breath 05/03/16  Yes Tanda Rockers, MD  amLODipine (NORVASC) 10 MG tablet Take 1 tablet (10 mg total) by mouth daily. 01/04/17  Yes Sagardia, Ines Bloomer, MD  clotrimazole-betamethasone (LOTRISONE) cream Apply 1 application topically 2 (two) times daily. 01/04/17  Yes Sagardia, Ines Bloomer, MD  indomethacin (INDOCIN) 50 MG capsule Take 1 capsule (50 mg total) by mouth every 8 (eight) hours as needed. Patient taking differently: Take 50 mg by mouth every 8 (eight) hours as needed for mild pain.  01/04/17  Yes Sagardia, Ines Bloomer, MD  ondansetron (ZOFRAN) 4 MG tablet Take 1 tablet (4 mg total) by mouth every 8 (eight) hours as needed for nausea or vomiting. 06/29/17  Yes Sagardia, Ines Bloomer, MD  SYMBICORT 160-4.5 MCG/ACT inhaler Inhale 2 puffs into the lungs 2 (two) times daily. 01/24/17  Yes Tanda Rockers, MD  HYDROcodone-acetaminophen (NORCO/VICODIN) 5-325 MG tablet Take 1 tablet by mouth every 6 (six) hours as needed. Patient not taking: Reported on 06/29/2017 05/01/15   Le, Thao P, DO  Tiotropium Bromide Monohydrate (SPIRIVA RESPIMAT) 2.5 MCG/ACT AERS Inhale 2 puffs into the lungs daily. Patient not taking: Reported on 06/29/2017 12/31/16   Tanda Rockers, MD  zolpidem (  AMBIEN) 5 MG tablet Take 1 tablet daily at bedtime as needed Patient not taking: Reported on 12/31/2016 05/01/15   Glenford Bayley, DO    Family History Family History  Problem Relation Age of Onset  . Dementia Mother   . Cancer Father   . Gout Father   . Thyroid disease Sister   . Kidney disease Brother   . Gout Brother   . Diabetes Maternal Grandmother   . Stroke Maternal Grandfather     Social History Social History   Tobacco Use  . Smoking status: Former Smoker    Packs/day: 2.00    Years: 43.00    Pack years: 86.00    Types: Cigarettes    Last attempt to quit: 05/03/2009    Years since quitting:  8.1  . Smokeless tobacco: Never Used  Substance Use Topics  . Alcohol use: Yes    Alcohol/week: 0.0 oz    Comment: 2-4  . Drug use: No     Allergies   Patient has no known allergies.   Review of Systems Review of Systems  Constitutional: Positive for unexpected weight change. Negative for fever.  Respiratory: Positive for shortness of breath.   Cardiovascular: Negative for chest pain.  Gastrointestinal: Positive for constipation, nausea and vomiting. Negative for abdominal distention and abdominal pain.  Neurological: Negative for dizziness and headaches.  All other systems reviewed and are negative.    Physical Exam Updated Vital Signs BP (!) 150/106   Pulse 97   Temp (!) 97.5 F (36.4 C) (Oral)   Resp 17   Ht 5\' 7"  (1.702 m)   Wt 54.4 kg (120 lb)   SpO2 94%   BMI 18.79 kg/m   Physical Exam  Constitutional: He is oriented to person, place, and time. He appears well-developed. He appears cachectic. No distress.  HENT:  Head: Normocephalic and atraumatic.  Right Ear: External ear normal.  Left Ear: External ear normal.  Nose: Nose normal.  Mouth/Throat: Mucous membranes are dry.  Eyes: Right eye exhibits no discharge. Left eye exhibits no discharge.  Neck: Neck supple.  Cardiovascular: Normal rate, regular rhythm and normal heart sounds.  Pulmonary/Chest: Effort normal. No accessory muscle usage. Tachypnea noted. He has decreased breath sounds (diffusely).  Abdominal: Soft. There is no tenderness.  Musculoskeletal: He exhibits no edema.  Neurological: He is alert and oriented to person, place, and time.  Skin: Skin is warm and dry. He is not diaphoretic.  Nursing note and vitals reviewed.    ED Treatments / Results  Labs (all labs ordered are listed, but only abnormal results are displayed) Labs Reviewed  LIPASE, BLOOD - Abnormal; Notable for the following components:      Result Value   Lipase 52 (*)    All other components within normal limits    COMPREHENSIVE METABOLIC PANEL - Abnormal; Notable for the following components:   Chloride 92 (*)    Glucose, Bld 104 (*)    BUN 47 (*)    Creatinine, Ser 2.08 (*)    Total Bilirubin 1.4 (*)    GFR calc non Af Amer 31 (*)    GFR calc Af Amer 36 (*)    Anion gap 16 (*)    All other components within normal limits  CBC - Abnormal; Notable for the following components:   WBC 17.3 (*)    Hemoglobin 17.6 (*)    All other components within normal limits  URINALYSIS, ROUTINE W REFLEX MICROSCOPIC - Abnormal; Notable for the  following components:   Ketones, ur 20 (*)    Leukocytes, UA TRACE (*)    Bacteria, UA RARE (*)    Squamous Epithelial / LPF 0-5 (*)    All other components within normal limits    EKG  EKG Interpretation None       Radiology Dg Abd Acute W/chest  Result Date: 07/08/2017 CLINICAL DATA:  Vomiting for 10 days.  Weight loss. EXAM: DG ABDOMEN ACUTE W/ 1V CHEST COMPARISON:  Chest radiograph 01/13/2016 FINDINGS: The lungs are hyperexpanded with multiple areas of increased lucency. No focal airspace consolidation or pulmonary edema. No pneumothorax or sizable pleural effusion. No free intraperitoneal air. No dilated loops of bowel are visible. The round densities overlying the left psoas muscle may be within the transverse colon. IMPRESSION: 1. COPD without acute airspace disease. 2. No free intraperitoneal air or evidence of small-bowel obstruction. Electronically Signed   By: Ulyses Jarred M.D.   On: 07/08/2017 00:07    Procedures Procedures (including critical care time)  Medications Ordered in ED Medications  sodium chloride 0.9 % bolus 1,000 mL (1,000 mLs Intravenous New Bag/Given 07/07/17 2308)  promethazine (PHENERGAN) injection 12.5 mg (12.5 mg Intravenous Given 07/07/17 2309)     Initial Impression / Assessment and Plan / ED Course  I have reviewed the triage vital signs and the nursing notes.  Pertinent labs & imaging results that were available during  my care of the patient were reviewed by me and considered in my medical decision making (see chart for details).     Patient appears to have acute kidney injury with a new creatinine of 2.08.  His baseline appears to be around 1.  I think this is all prerenal given his continued vomiting.  Unclear source but he will need a workup as long with resuscitation/hydration. Dr. Alcario Drought to admit. He requests CT chest/abdomen/pelvis without contrast to assess for possible cancerous source. Patient appears stable for floor admission.  Final Clinical Impressions(s) / ED Diagnoses   Final diagnoses:  Acute kidney injury [facility])    ED Discharge Orders    None       Sherwood Gambler, MD 07/08/17 0021

## 2017-07-07 NOTE — ED Triage Notes (Signed)
Pt sent for evaluation for vomiting over past 10 days; denies diarrhea; denies pain. Pt hx of COPD and verbalizes lives at 88-89%. Pt verbalizes has a liter of oxygen he uses at home; pt placed on 1 lpm [place] but moved [place] from nose to mouth.

## 2017-07-08 ENCOUNTER — Emergency Department (HOSPITAL_COMMUNITY): Payer: Medicare Other

## 2017-07-08 DIAGNOSIS — Y838 Other surgical procedures as the cause of abnormal reaction of the patient, or of later complication, without mention of misadventure at the time of the procedure: Secondary | ICD-10-CM | POA: Diagnosis not present

## 2017-07-08 DIAGNOSIS — R112 Nausea with vomiting, unspecified: Secondary | ICD-10-CM | POA: Diagnosis present

## 2017-07-08 DIAGNOSIS — A419 Sepsis, unspecified organism: Secondary | ICD-10-CM | POA: Diagnosis not present

## 2017-07-08 DIAGNOSIS — J9611 Chronic respiratory failure with hypoxia: Secondary | ICD-10-CM | POA: Diagnosis not present

## 2017-07-08 DIAGNOSIS — J90 Pleural effusion, not elsewhere classified: Secondary | ICD-10-CM | POA: Diagnosis not present

## 2017-07-08 DIAGNOSIS — C155 Malignant neoplasm of lower third of esophagus: Secondary | ICD-10-CM | POA: Diagnosis not present

## 2017-07-08 DIAGNOSIS — J189 Pneumonia, unspecified organism: Secondary | ICD-10-CM | POA: Diagnosis not present

## 2017-07-08 DIAGNOSIS — J181 Lobar pneumonia, unspecified organism: Secondary | ICD-10-CM | POA: Diagnosis not present

## 2017-07-08 DIAGNOSIS — J9621 Acute and chronic respiratory failure with hypoxia: Secondary | ICD-10-CM | POA: Diagnosis present

## 2017-07-08 DIAGNOSIS — E860 Dehydration: Secondary | ICD-10-CM | POA: Diagnosis present

## 2017-07-08 DIAGNOSIS — R933 Abnormal findings on diagnostic imaging of other parts of digestive tract: Secondary | ICD-10-CM | POA: Diagnosis not present

## 2017-07-08 DIAGNOSIS — K2970 Gastritis, unspecified, without bleeding: Secondary | ICD-10-CM | POA: Diagnosis not present

## 2017-07-08 DIAGNOSIS — R64 Cachexia: Secondary | ICD-10-CM | POA: Diagnosis present

## 2017-07-08 DIAGNOSIS — R9431 Abnormal electrocardiogram [ECG] [EKG]: Secondary | ICD-10-CM | POA: Diagnosis not present

## 2017-07-08 DIAGNOSIS — R634 Abnormal weight loss: Secondary | ICD-10-CM | POA: Diagnosis not present

## 2017-07-08 DIAGNOSIS — R278 Other lack of coordination: Secondary | ICD-10-CM | POA: Diagnosis not present

## 2017-07-08 DIAGNOSIS — Z9911 Dependence on respirator [ventilator] status: Secondary | ICD-10-CM | POA: Diagnosis not present

## 2017-07-08 DIAGNOSIS — J9811 Atelectasis: Secondary | ICD-10-CM | POA: Diagnosis not present

## 2017-07-08 DIAGNOSIS — R2689 Other abnormalities of gait and mobility: Secondary | ICD-10-CM | POA: Diagnosis not present

## 2017-07-08 DIAGNOSIS — F05 Delirium due to known physiological condition: Secondary | ICD-10-CM | POA: Diagnosis not present

## 2017-07-08 DIAGNOSIS — R0902 Hypoxemia: Secondary | ICD-10-CM | POA: Diagnosis not present

## 2017-07-08 DIAGNOSIS — J9601 Acute respiratory failure with hypoxia: Secondary | ICD-10-CM | POA: Diagnosis not present

## 2017-07-08 DIAGNOSIS — R6520 Severe sepsis without septic shock: Secondary | ICD-10-CM | POA: Diagnosis not present

## 2017-07-08 DIAGNOSIS — E46 Unspecified protein-calorie malnutrition: Secondary | ICD-10-CM | POA: Diagnosis not present

## 2017-07-08 DIAGNOSIS — K229 Disease of esophagus, unspecified: Secondary | ICD-10-CM | POA: Diagnosis not present

## 2017-07-08 DIAGNOSIS — I10 Essential (primary) hypertension: Secondary | ICD-10-CM | POA: Diagnosis not present

## 2017-07-08 DIAGNOSIS — K567 Ileus, unspecified: Secondary | ICD-10-CM | POA: Diagnosis not present

## 2017-07-08 DIAGNOSIS — J9690 Respiratory failure, unspecified, unspecified whether with hypoxia or hypercapnia: Secondary | ICD-10-CM | POA: Diagnosis not present

## 2017-07-08 DIAGNOSIS — J9602 Acute respiratory failure with hypercapnia: Secondary | ICD-10-CM | POA: Diagnosis not present

## 2017-07-08 DIAGNOSIS — C159 Malignant neoplasm of esophagus, unspecified: Secondary | ICD-10-CM | POA: Diagnosis not present

## 2017-07-08 DIAGNOSIS — C801 Malignant (primary) neoplasm, unspecified: Secondary | ICD-10-CM | POA: Diagnosis not present

## 2017-07-08 DIAGNOSIS — N179 Acute kidney failure, unspecified: Secondary | ICD-10-CM | POA: Insufficient documentation

## 2017-07-08 DIAGNOSIS — E872 Acidosis: Secondary | ICD-10-CM | POA: Diagnosis not present

## 2017-07-08 DIAGNOSIS — J690 Pneumonitis due to inhalation of food and vomit: Secondary | ICD-10-CM | POA: Diagnosis not present

## 2017-07-08 DIAGNOSIS — N281 Cyst of kidney, acquired: Secondary | ICD-10-CM | POA: Diagnosis not present

## 2017-07-08 DIAGNOSIS — K222 Esophageal obstruction: Secondary | ICD-10-CM | POA: Diagnosis not present

## 2017-07-08 DIAGNOSIS — C160 Malignant neoplasm of cardia: Secondary | ICD-10-CM | POA: Diagnosis not present

## 2017-07-08 DIAGNOSIS — J449 Chronic obstructive pulmonary disease, unspecified: Secondary | ICD-10-CM

## 2017-07-08 DIAGNOSIS — K5730 Diverticulosis of large intestine without perforation or abscess without bleeding: Secondary | ICD-10-CM | POA: Diagnosis not present

## 2017-07-08 DIAGNOSIS — E876 Hypokalemia: Secondary | ICD-10-CM | POA: Diagnosis not present

## 2017-07-08 DIAGNOSIS — T17590A Other foreign object in bronchus causing asphyxiation, initial encounter: Secondary | ICD-10-CM | POA: Diagnosis present

## 2017-07-08 DIAGNOSIS — J95821 Acute postprocedural respiratory failure: Secondary | ICD-10-CM | POA: Diagnosis not present

## 2017-07-08 DIAGNOSIS — E871 Hypo-osmolality and hyponatremia: Secondary | ICD-10-CM | POA: Diagnosis not present

## 2017-07-08 DIAGNOSIS — R1310 Dysphagia, unspecified: Secondary | ICD-10-CM | POA: Diagnosis not present

## 2017-07-08 DIAGNOSIS — E43 Unspecified severe protein-calorie malnutrition: Secondary | ICD-10-CM | POA: Diagnosis not present

## 2017-07-08 DIAGNOSIS — M1990 Unspecified osteoarthritis, unspecified site: Secondary | ICD-10-CM | POA: Diagnosis not present

## 2017-07-08 DIAGNOSIS — T17908A Unspecified foreign body in respiratory tract, part unspecified causing other injury, initial encounter: Secondary | ICD-10-CM | POA: Diagnosis not present

## 2017-07-08 DIAGNOSIS — Z934 Other artificial openings of gastrointestinal tract status: Secondary | ICD-10-CM | POA: Diagnosis not present

## 2017-07-08 DIAGNOSIS — R091 Pleurisy: Secondary | ICD-10-CM | POA: Diagnosis not present

## 2017-07-08 DIAGNOSIS — J439 Emphysema, unspecified: Secondary | ICD-10-CM | POA: Diagnosis not present

## 2017-07-08 DIAGNOSIS — R188 Other ascites: Secondary | ICD-10-CM | POA: Diagnosis not present

## 2017-07-08 DIAGNOSIS — Z432 Encounter for attention to ileostomy: Secondary | ICD-10-CM | POA: Diagnosis not present

## 2017-07-08 DIAGNOSIS — E785 Hyperlipidemia, unspecified: Secondary | ICD-10-CM | POA: Diagnosis present

## 2017-07-08 DIAGNOSIS — M109 Gout, unspecified: Secondary | ICD-10-CM | POA: Diagnosis present

## 2017-07-08 DIAGNOSIS — J918 Pleural effusion in other conditions classified elsewhere: Secondary | ICD-10-CM | POA: Diagnosis not present

## 2017-07-08 DIAGNOSIS — R0602 Shortness of breath: Secondary | ICD-10-CM | POA: Diagnosis not present

## 2017-07-08 DIAGNOSIS — D490 Neoplasm of unspecified behavior of digestive system: Secondary | ICD-10-CM | POA: Diagnosis not present

## 2017-07-08 DIAGNOSIS — Z681 Body mass index (BMI) 19 or less, adult: Secondary | ICD-10-CM | POA: Diagnosis not present

## 2017-07-08 DIAGNOSIS — E440 Moderate protein-calorie malnutrition: Secondary | ICD-10-CM | POA: Diagnosis not present

## 2017-07-08 DIAGNOSIS — K9413 Enterostomy malfunction: Secondary | ICD-10-CM | POA: Diagnosis not present

## 2017-07-08 DIAGNOSIS — M6281 Muscle weakness (generalized): Secondary | ICD-10-CM | POA: Diagnosis not present

## 2017-07-08 DIAGNOSIS — K56609 Unspecified intestinal obstruction, unspecified as to partial versus complete obstruction: Secondary | ICD-10-CM | POA: Diagnosis not present

## 2017-07-08 DIAGNOSIS — D001 Carcinoma in situ of esophagus: Secondary | ICD-10-CM | POA: Diagnosis not present

## 2017-07-08 DIAGNOSIS — Z4682 Encounter for fitting and adjustment of non-vascular catheter: Secondary | ICD-10-CM | POA: Diagnosis not present

## 2017-07-08 DIAGNOSIS — Z5111 Encounter for antineoplastic chemotherapy: Secondary | ICD-10-CM | POA: Diagnosis not present

## 2017-07-08 MED ORDER — INDOMETHACIN 25 MG PO CAPS
50.0000 mg | ORAL_CAPSULE | Freq: Three times a day (TID) | ORAL | Status: DC | PRN
  Filled 2017-07-08: qty 2

## 2017-07-08 MED ORDER — CEFTRIAXONE SODIUM 1 G IJ SOLR
1.0000 g | INTRAMUSCULAR | Status: DC
  Administered 2017-07-08 – 2017-07-11 (×4): 1 g via INTRAVENOUS
  Filled 2017-07-08 (×4): qty 10

## 2017-07-08 MED ORDER — METOCLOPRAMIDE HCL 5 MG/ML IJ SOLN
10.0000 mg | Freq: Once | INTRAMUSCULAR | Status: AC
  Administered 2017-07-08: 10 mg via INTRAVENOUS
  Filled 2017-07-08: qty 2

## 2017-07-08 MED ORDER — SODIUM CHLORIDE 0.9 % IV SOLN
INTRAVENOUS | Status: DC
  Administered 2017-07-08 – 2017-07-09 (×2): via INTRAVENOUS

## 2017-07-08 MED ORDER — GI COCKTAIL ~~LOC~~: 30.0000 mL | Freq: Once | ORAL | Status: DC

## 2017-07-08 MED ORDER — SODIUM CHLORIDE 0.9 % IV SOLN
INTRAVENOUS | Status: AC
  Administered 2017-07-08: 06:00:00 via INTRAVENOUS

## 2017-07-08 MED ORDER — ALBUTEROL SULFATE HFA 108 (90 BASE) MCG/ACT IN AERS
1.0000 | INHALATION_SPRAY | RESPIRATORY_TRACT | Status: DC | PRN
  Filled 2017-07-08: qty 6.7

## 2017-07-08 MED ORDER — IOPAMIDOL (ISOVUE-300) INJECTION 61%
INTRAVENOUS | Status: AC
  Administered 2017-07-08: 30 mL via ORAL
  Filled 2017-07-08: qty 30

## 2017-07-08 MED ORDER — ONDANSETRON HCL 4 MG/2ML IJ SOLN
4.0000 mg | Freq: Four times a day (QID) | INTRAMUSCULAR | Status: DC | PRN
  Administered 2017-07-08: 4 mg via INTRAVENOUS
  Filled 2017-07-08: qty 2

## 2017-07-08 MED ORDER — IOPAMIDOL (ISOVUE-300) INJECTION 61%
30.0000 mL | Freq: Once | INTRAVENOUS | Status: AC | PRN
  Administered 2017-07-08: 30 mL via ORAL

## 2017-07-08 MED ORDER — DEXTROSE 5 % IV SOLN
500.0000 mg | INTRAVENOUS | Status: DC
  Administered 2017-07-08 – 2017-07-11 (×4): 500 mg via INTRAVENOUS
  Filled 2017-07-08 (×4): qty 500

## 2017-07-08 MED ORDER — PROMETHAZINE HCL 25 MG/ML IJ SOLN
12.5000 mg | Freq: Once | INTRAMUSCULAR | Status: AC
  Administered 2017-07-08: 12.5 mg via INTRAVENOUS
  Filled 2017-07-08: qty 1

## 2017-07-08 MED ORDER — PANTOPRAZOLE SODIUM 40 MG PO TBEC
40.0000 mg | DELAYED_RELEASE_TABLET | Freq: Every day | ORAL | Status: DC
  Administered 2017-07-08: 40 mg via ORAL
  Filled 2017-07-08 (×2): qty 1

## 2017-07-08 MED ORDER — PROMETHAZINE HCL 25 MG/ML IJ SOLN
12.5000 mg | Freq: Four times a day (QID) | INTRAMUSCULAR | Status: DC | PRN
Start: 2017-07-08 — End: 2017-07-08
  Filled 2017-07-08: qty 1

## 2017-07-08 MED ORDER — SODIUM CHLORIDE 0.9 % IV SOLN
INTRAVENOUS | Status: DC
  Administered 2017-07-08: 01:00:00 via INTRAVENOUS

## 2017-07-08 MED ORDER — ALBUTEROL SULFATE (2.5 MG/3ML) 0.083% IN NEBU
2.5000 mg | INHALATION_SOLUTION | RESPIRATORY_TRACT | Status: DC | PRN
  Administered 2017-07-17: 2.5 mg via RESPIRATORY_TRACT
  Filled 2017-07-08: qty 3

## 2017-07-08 MED ORDER — AMLODIPINE BESYLATE 10 MG PO TABS
10.0000 mg | ORAL_TABLET | Freq: Every day | ORAL | Status: DC
  Administered 2017-07-08: 10 mg via ORAL
  Filled 2017-07-08: qty 2
  Filled 2017-07-08: qty 1

## 2017-07-08 NOTE — ED Notes (Signed)
Report given to Ek[place], RN 

## 2017-07-08 NOTE — ED Notes (Signed)
ED TO INPATIENT HANDOFF REPORT  Name/Age/Gender George Stokes 72 y.o. male  Code Status    Code Status Orders  (From admission, onward)        Start     Ordered   07/08/17 0955  Full code  Continuous     07/08/17 0954    Code Status History    Date Active Date Inactive Code Status Order ID Comments User Context   This patient has a current code status but no historical code status.      Home/SNF/Other Home  Chief Complaint vomiting   Level of Care/Admitting Diagnosis ED Disposition    ED Disposition Condition [facility] Area: [place] [[id number]]  Level of Care: Med-Surg [16]  Diagnosis: Intractable nausea and vomiting [188416]  Admitting Physician: Etta Quill [[id number]]  Attending Physician: Etta Quill [[id number]]  PT Class (Do Not Modify): Observation [104]  PT Acc Code (Do Not Modify): Observation [10022]       Medical History Past Medical History:  Diagnosis Date  . COPD (chronic obstructive pulmonary disease) ([place])   . Gout   . Hypertension     Allergies No Known Allergies  IV Location/Drains/Wounds Patient Lines/Drains/Airways Status   Active Line/Drains/Airways    Name:   Placement date:   Placement time:   Site:   Days:   Peripheral IV 07/07/17 Right Forearm   07/07/17    2200    Forearm   1          Labs/Imaging Results for orders placed or performed during the hospital encounter of 07/07/17 (from the past 48 hour(s))  Lipase, blood     Status: Abnormal   Collection Time: 07/07/17  3:50 PM  Result Value Ref Range   Lipase 52 (H) 11 - 51 U/L  Comprehensive metabolic panel     Status: Abnormal   Collection Time: 07/07/17  3:50 PM  Result Value Ref Range   Sodium 137 135 - 145 mmol/L   Potassium 4.4 3.5 - 5.1 mmol/L   Chloride 92 (L) 101 - 111 mmol/L   CO2 29 22 - 32 mmol/L   Glucose, Bld 104 (H) 65 - 99 mg/dL   BUN 47 (H) 6 - 20 mg/dL   Creatinine, Ser 2.08 (H) 0.61 - 1.24 mg/dL   Calcium 9.8  8.9 - 10.3 mg/dL   Total Protein 7.1 6.5 - 8.1 g/dL   Albumin 3.9 3.5 - 5.0 g/dL   AST 26 15 - 41 U/L   ALT 22 17 - 63 U/L   Alkaline Phosphatase 96 38 - 126 U/L   Total Bilirubin 1.4 (H) 0.3 - 1.2 mg/dL   GFR calc non Af Amer 31 (L) >60 mL/min   GFR calc Af Amer 36 (L) >60 mL/min    Comment: (NOTE) The eGFR has been calculated using the CKD EPI equation. This calculation has not been validated in all clinical situations. eGFR's persistently <60 mL/min signify possible Chronic Kidney Disease.    Anion gap 16 (H) 5 - 15  CBC     Status: Abnormal   Collection Time: 07/07/17  3:50 PM  Result Value Ref Range   WBC 17.3 (H) 4.0 - 10.5 K/uL   RBC 5.79 4.22 - 5.81 MIL/uL   Hemoglobin 17.6 (H) 13.0 - 17.0 g/dL   HCT 51.0 39.0 - 52.0 %   MCV 88.1 78.0 - 100.0 fL   MCH 30.4 26.0 - 34.0 pg   MCHC 34.5 30.0 -  36.0 g/dL   RDW 14.2 11.5 - 15.5 %   Platelets 266 150 - 400 K/uL  Urinalysis, Routine w reflex microscopic     Status: Abnormal   Collection Time: 07/07/17  8:05 PM  Result Value Ref Range   Color, Urine YELLOW YELLOW   APPearance CLEAR CLEAR   Specific Gravity, Urine 1.018 1.005 - 1.030   pH 5.0 5.0 - 8.0   Glucose, UA NEGATIVE NEGATIVE mg/dL   Hgb urine dipstick NEGATIVE NEGATIVE   Bilirubin Urine NEGATIVE NEGATIVE   Ketones, ur 20 (A) NEGATIVE mg/dL   Protein, ur NEGATIVE NEGATIVE mg/dL   Nitrite NEGATIVE NEGATIVE   Leukocytes, UA TRACE (A) NEGATIVE   RBC / HPF 0-5 0 - 5 RBC/hpf   WBC, UA 0-5 0 - 5 WBC/hpf   Bacteria, UA RARE (A) NONE SEEN   Squamous Epithelial / LPF 0-5 (A) NONE SEEN   Mucus PRESENT    Hyaline Casts, UA PRESENT    Ct Abdomen Pelvis Wo Contrast  Result Date: 07/08/2017 CLINICAL DATA:  72 year old male with nausea vomiting. EXAM: CT CHEST, ABDOMEN AND PELVIS WITHOUT CONTRAST TECHNIQUE: Multidetector CT imaging of the chest, abdomen and pelvis was performed following the standard protocol without IV contrast. COMPARISON:  Radiograph of the chest abdomen  pelvis dated 07/07/2017 FINDINGS: Evaluation of this exam is limited in the absence of intravenous contrast. CT CHEST FINDINGS Cardiovascular: There is no cardiomegaly. Coronary vascular calcification primarily involving the LAD. Small anterior pericardial effusion measuring 8 mm in thickness. There is mild atherosclerotic calcification of the thoracic aorta. The thoracic aorta and central pulmonary arteries are grossly unremarkable on this noncontrast CT. Mediastinum/Nodes: No hilar or mediastinal adenopathy. The esophagus contains a mixed density fluid content with areas of higher attenuation, likely residual oral contrast. Findings likely represent reflux or esophageal dysmotility and delayed emptying. Blood product is less likely. Clinical correlation is recommended. Endoscopy may provide better evaluation of the esophagus if clinically indicated. Lungs/Pleura: There is emphysematous changes of the lungs. Patchy area of nodular airspace density primarily involving the right middle lobe and anterior right upper lobe most consistent with pneumonia. Clinical correlation and follow-up to resolution recommended. There is mild bronchiectatic changes. No pneumothorax or pleural effusion. Endobronchial content in the region of the carina and proximal portion of the left mainstem bronchus likely mucous plugging. Musculoskeletal: There is degenerative changes of the spine. Old healed left rib fractures. No acute osseous pathology. CT ABDOMEN PELVIS FINDINGS No intra-abdominal free air or free fluid. Hepatobiliary: The liver is unremarkable. No intrahepatic biliary ductal dilatation. There is probable sludge or small stones within the gallbladder. No pericholecystic fluid or evidence of acute cholecystitis by CT. Pancreas: Unremarkable. No pancreatic ductal dilatation or surrounding inflammatory changes. Spleen: Normal in size without focal abnormality. Adrenals/Urinary Tract: The adrenal glands are unremarkable. Multiple  bilateral renal hypodense lesions are not characterized on this CT but appear to demonstrate fluid attenuation. Ultrasound may provide better characterisation. Punctate nonobstructing right renal calculi versus vascular calcification. There is no hydronephrosis or obstructing stone on either side. The visualized ureters and urinary bladder appear unremarkable. Stomach/Bowel: There is a small hiatal hernia. Oral contrast opacifies multiple loops of small bowel. There is no evidence of bowel obstruction or active inflammation. There is sigmoid diverticulosis without active inflammation. Normal appendix. Vascular/Lymphatic: Advanced aortoiliac atherosclerotic disease. The IVC is grossly unremarkable. No portal venous gas. There is no adenopathy. Reproductive: The prostate and seminal vesicles are grossly unremarkable. Other: None Musculoskeletal: Osteopenia with degenerative changes  of the lower lumbar spine. No acute osseous pathology. IMPRESSION: 1. Patchy areas of nodular airspace density primarily in the right middle lobe and right upper lobe most consistent with pneumonia. Clinical correlation and follow-up to resolution after treatment recommended. 2. Emphysema.  No pleural effusion or pneumothorax. 3. Mucous plugging in the proximal left mainstem bronchus. 4. Sigmoid diverticulosis. No bowel obstruction or active inflammation. Normal appendix. 5. Bilateral renal hypodense lesions, incompletely characterized. Ultrasound may provide better evaluation. No hydronephrosis or obstructing stone. 6. Aortic Atherosclerosis (ICD10-I70.0) and Emphysema (ICD10-J43.9). Electronically Signed   By: Anner Crete M.D.   On: 07/08/2017 05:47   Ct Chest Wo Contrast  Result Date: 07/08/2017 CLINICAL DATA:  72 year old male with nausea vomiting. EXAM: CT CHEST, ABDOMEN AND PELVIS WITHOUT CONTRAST TECHNIQUE: Multidetector CT imaging of the chest, abdomen and pelvis was performed following the standard protocol without IV  contrast. COMPARISON:  Radiograph of the chest abdomen pelvis dated 07/07/2017 FINDINGS: Evaluation of this exam is limited in the absence of intravenous contrast. CT CHEST FINDINGS Cardiovascular: There is no cardiomegaly. Coronary vascular calcification primarily involving the LAD. Small anterior pericardial effusion measuring 8 mm in thickness. There is mild atherosclerotic calcification of the thoracic aorta. The thoracic aorta and central pulmonary arteries are grossly unremarkable on this noncontrast CT. Mediastinum/Nodes: No hilar or mediastinal adenopathy. The esophagus contains a mixed density fluid content with areas of higher attenuation, likely residual oral contrast. Findings likely represent reflux or esophageal dysmotility and delayed emptying. Blood product is less likely. Clinical correlation is recommended. Endoscopy may provide better evaluation of the esophagus if clinically indicated. Lungs/Pleura: There is emphysematous changes of the lungs. Patchy area of nodular airspace density primarily involving the right middle lobe and anterior right upper lobe most consistent with pneumonia. Clinical correlation and follow-up to resolution recommended. There is mild bronchiectatic changes. No pneumothorax or pleural effusion. Endobronchial content in the region of the carina and proximal portion of the left mainstem bronchus likely mucous plugging. Musculoskeletal: There is degenerative changes of the spine. Old healed left rib fractures. No acute osseous pathology. CT ABDOMEN PELVIS FINDINGS No intra-abdominal free air or free fluid. Hepatobiliary: The liver is unremarkable. No intrahepatic biliary ductal dilatation. There is probable sludge or small stones within the gallbladder. No pericholecystic fluid or evidence of acute cholecystitis by CT. Pancreas: Unremarkable. No pancreatic ductal dilatation or surrounding inflammatory changes. Spleen: Normal in size without focal abnormality.  Adrenals/Urinary Tract: The adrenal glands are unremarkable. Multiple bilateral renal hypodense lesions are not characterized on this CT but appear to demonstrate fluid attenuation. Ultrasound may provide better characterisation. Punctate nonobstructing right renal calculi versus vascular calcification. There is no hydronephrosis or obstructing stone on either side. The visualized ureters and urinary bladder appear unremarkable. Stomach/Bowel: There is a small hiatal hernia. Oral contrast opacifies multiple loops of small bowel. There is no evidence of bowel obstruction or active inflammation. There is sigmoid diverticulosis without active inflammation. Normal appendix. Vascular/Lymphatic: Advanced aortoiliac atherosclerotic disease. The IVC is grossly unremarkable. No portal venous gas. There is no adenopathy. Reproductive: The prostate and seminal vesicles are grossly unremarkable. Other: None Musculoskeletal: Osteopenia with degenerative changes of the lower lumbar spine. No acute osseous pathology. IMPRESSION: 1. Patchy areas of nodular airspace density primarily in the right middle lobe and right upper lobe most consistent with pneumonia. Clinical correlation and follow-up to resolution after treatment recommended. 2. Emphysema.  No pleural effusion or pneumothorax. 3. Mucous plugging in the proximal left mainstem bronchus. 4. Sigmoid diverticulosis. No  bowel obstruction or active inflammation. Normal appendix. 5. Bilateral renal hypodense lesions, incompletely characterized. Ultrasound may provide better evaluation. No hydronephrosis or obstructing stone. 6. Aortic Atherosclerosis (ICD10-I70.0) and Emphysema (ICD10-J43.9). Electronically Signed   By: Anner Crete M.D.   On: 07/08/2017 05:47   Dg Abd Acute W/chest  Result Date: 07/08/2017 CLINICAL DATA:  Vomiting for 10 days.  Weight loss. EXAM: DG ABDOMEN ACUTE W/ 1V CHEST COMPARISON:  Chest radiograph 01/13/2016 FINDINGS: The lungs are hyperexpanded  with multiple areas of increased lucency. No focal airspace consolidation or pulmonary edema. No pneumothorax or sizable pleural effusion. No free intraperitoneal air. No dilated loops of bowel are visible. The round densities overlying the left psoas muscle may be within the transverse colon. IMPRESSION: 1. COPD without acute airspace disease. 2. No free intraperitoneal air or evidence of small-bowel obstruction. Electronically Signed   By: Ulyses Jarred M.D.   On: 07/08/2017 00:07    Pending Labs Unresulted Labs (From admission, onward)   Start     Ordered   07/09/17 9924  Basic metabolic panel  Tomorrow morning,   R     07/08/17 0954   07/09/17 0500  CBC WITH DIFFERENTIAL  Tomorrow morning,   R     07/08/17 0954   07/08/17 0955  Culture, blood (routine x 2) Call MD if unable to obtain prior to antibiotics being given  BLOOD CULTURE X 2,   R    Comments:  If blood cultures drawn in Emergency Department - Do not draw and cancel order    07/08/17 0954   07/08/17 0955  Culture, sputum-assessment  Once,   R     07/08/17 0954   07/08/17 0955  Gram stain  Once,   R     07/08/17 0954   07/08/17 0955  Strep pneumoniae urinary antigen  Once,   R     07/08/17 0954      Vitals/Pain Today's Vitals   07/08/17 1104 07/08/17 1130 07/08/17 1230 07/08/17 1300  BP: (!) 142/89 101/74 119/70 110/67  Pulse:  77 73 74  Resp:    18  Temp:      TempSrc:      SpO2:  93% 98% 92%  Weight:      Height:        Isolation Precautions No active isolations  Medications Medications  albuterol (PROVENTIL HFA;VENTOLIN HFA) 108 (90 Base) MCG/ACT inhaler 1-2 puff (not administered)  amLODipine (NORVASC) tablet 10 mg (10 mg Oral Given 07/08/17 1104)  indomethacin (INDOCIN) capsule 50 mg (not administered)  0.9 %  sodium chloride infusion ( Intravenous New Bag/Given 07/08/17 1106)  cefTRIAXone (ROCEPHIN) 1 g in dextrose 5 % 50 mL IVPB (0 g Intravenous Stopped 07/08/17 1131)  azithromycin (ZITHROMAX) 500 mg in  dextrose 5 % 250 mL IVPB (0 mg Intravenous Stopped 07/08/17 1250)  pantoprazole (PROTONIX) EC tablet 40 mg (40 mg Oral Given 07/08/17 1137)  sodium chloride 0.9 % bolus 1,000 mL (0 mLs Intravenous Stopped 07/08/17 0045)  promethazine (PHENERGAN) injection 12.5 mg (12.5 mg Intravenous Given 07/07/17 2309)  iopamidol (ISOVUE-300) 61 % injection 30 mL (30 mLs Oral Contrast Given 07/08/17 0250)  0.9 %  sodium chloride infusion ( Intravenous Stopped 07/08/17 1106)  promethazine (PHENERGAN) injection 12.5 mg (12.5 mg Intravenous Given 07/08/17 1141)    Mobility walks with person assist

## 2017-07-08 NOTE — H&P (Signed)
History and Physical    George Stokes NFA:[id number] DOB: May 11, 1946 DOA: 07/07/2017  PCP: Patient, No Pcp Per  Patient coming from:  home  Chief Complaint:  n/v  HPI: George Stokes is a 72 y.o. male with medical history significant of COPD, CRF on 4 liters he thinks at home, htn comes in with over a week of nausea and vomiting with no abdominal pain or blood in vomit.  No fevers.  No diarrhea. No cough.  No chest pain.  He says he gets nauseated even after drinking liquids.  Pt referred for admission for report of wt loss and his n/v.  Review of Systems: As per HPI otherwise 10 point review of systems negative.   Past Medical History:  Diagnosis Date  . COPD (chronic obstructive pulmonary disease) (HCC)   . Gout   . Hypertension     History reviewed. No pertinent surgical history.   reports that he quit smoking about 8 years ago. His smoking use included cigarettes. He has a 86.00 pack-year smoking history. he has never used smokeless tobacco. He reports that he drinks alcohol. He reports that he does not use drugs.  No Known Allergies  Family History  Problem Relation Age of Onset  . Dementia Mother   . Cancer Father   . Gout Father   . Thyroid disease Sister   . Kidney disease Brother   . Gout Brother   . Diabetes Maternal Grandmother   . Stroke Maternal Grandfather     Prior to Admission medications   Medication Sig Start Date End Date Taking? Authorizing Provider  albuterol (PROAIR HFA) 108 (90 Base) MCG/ACT inhaler 2 puffs every 4 hours as needed only  if your can't catch your breath 05/03/16  Yes Nyoka Cowden, MD  amLODipine (NORVASC) 10 MG tablet Take 1 tablet (10 mg total) by mouth daily. 01/04/17  Yes Sagardia, Eilleen Kempf, MD  clotrimazole-betamethasone (LOTRISONE) cream Apply 1 application topically 2 (two) times daily. 01/04/17  Yes Sagardia, Eilleen Kempf, MD  indomethacin (INDOCIN) 50 MG capsule Take 1 capsule (50 mg total) by mouth every 8 (eight) hours as  needed. Patient taking differently: Take 50 mg by mouth every 8 (eight) hours as needed for mild pain.  01/04/17  Yes Sagardia, Eilleen Kempf, MD  ondansetron (ZOFRAN) 4 MG tablet Take 1 tablet (4 mg total) by mouth every 8 (eight) hours as needed for nausea or vomiting. 06/29/17  Yes Sagardia, Eilleen Kempf, MD  SYMBICORT 160-4.5 MCG/ACT inhaler Inhale 2 puffs into the lungs 2 (two) times daily. 01/24/17  Yes Nyoka Cowden, MD  HYDROcodone-acetaminophen (NORCO/VICODIN) 5-325 MG tablet Take 1 tablet by mouth every 6 (six) hours as needed. Patient not taking: Reported on 06/29/2017 05/01/15   Le, Thao P, DO  Tiotropium Bromide Monohydrate (SPIRIVA RESPIMAT) 2.5 MCG/ACT AERS Inhale 2 puffs into the lungs daily. Patient not taking: Reported on 06/29/2017 12/31/16   Nyoka Cowden, MD  zolpidem [facility]) 5 MG tablet Take 1 tablet daily at bedtime as needed Patient not taking: Reported on 12/31/2016 05/01/15   Lenell Antu, DO    Physical Exam: Vitals:   07/08/17 0400 07/08/17 0430 07/08/17 0500 07/08/17 0615  BP: (!) 139/97 134/71 (!) 149/90 129/79  Pulse: 78 (!) 121 98 85  Resp:    20  Temp:      TempSrc:      SpO2: 96% 91% 97% 98%  Weight:      Height:  Constitutional: NAD, calm, comfortable Vitals:   07/08/17 0400 07/08/17 0430 07/08/17 0500 07/08/17 0615  BP: (!) 139/97 134/71 (!) 149/90 129/79  Pulse: 78 (!) 121 98 85  Resp:    20  Temp:      TempSrc:      SpO2: 96% 91% 97% 98%  Weight:      Height:       Eyes: PERRL, lids and conjunctivae normal ENMT: Mucous membranes are moist. Posterior pharynx clear of any exudate or lesions.Normal dentition.  Neck: normal, supple, no masses, no thyromegaly Respiratory: clear to auscultation bilaterally, no wheezing, no crackles. Normal respiratory effort. No accessory muscle use.  Cardiovascular: Regular rate and rhythm, no murmurs / rubs / gallops. No extremity edema. 2+ pedal pulses. No carotid bruits.  Abdomen: no tenderness, no masses  palpated. No hepatosplenomegaly. Bowel sounds positive.  Musculoskeletal: no clubbing / cyanosis. No joint deformity upper and lower extremities. Good ROM, no contractures. Normal muscle tone.  Skin: no rashes, lesions, ulcers. No induration Neurologic: CN 2-12 grossly intact. Sensation intact, DTR normal. Strength 5/5 in all 4.  Psychiatric: Normal judgment and insight. Alert and oriented x 3. Normal mood.    Labs on Admission: I have personally reviewed following labs and imaging studies  CBC: Recent Labs  Lab 07/07/17 1550  WBC 17.3*  HGB 17.6*  HCT 51.0  MCV 88.1  PLT 266   Basic Metabolic Panel: Recent Labs  Lab 07/07/17 1550  NA 137  K 4.4  CL 92*  CO2 29  GLUCOSE 104*  BUN 47*  CREATININE 2.08*  CALCIUM 9.8   GFR: Estimated Creatinine Clearance: 25.8 mL/min (A) (by C-G formula based on SCr of 2.08 mg/dL (H)). Liver Function Tests: Recent Labs  Lab 07/07/17 1550  AST 26  ALT 22  ALKPHOS 96  BILITOT 1.4*  PROT 7.1  ALBUMIN 3.9   Recent Labs  Lab 07/07/17 1550  LIPASE 52*   No results for input(s): AMMONIA in the last 168 hours. Coagulation Profile: No results for input(s): INR, PROTIME in the last 168 hours. Cardiac Enzymes: No results for input(s): CKTOTAL, CKMB, CKMBINDEX, TROPONINI in the last 168 hours. BNP (last 3 results) No results for input(s): PROBNP in the last 8760 hours. HbA1C: No results for input(s): HGBA1C in the last 72 hours. CBG: No results for input(s): GLUCAP in the last 168 hours. Lipid Profile: No results for input(s): CHOL, HDL, LDLCALC, TRIG, CHOLHDL, LDLDIRECT in the last 72 hours. Thyroid Function Tests: No results for input(s): TSH, T4TOTAL, FREET4, T3FREE, THYROIDAB in the last 72 hours. Anemia Panel: No results for input(s): VITAMINB12, FOLATE, FERRITIN, TIBC, IRON, RETICCTPCT in the last 72 hours. Urine analysis:    Component Value Date/Time   COLORURINE YELLOW 07/07/2017 2005   APPEARANCEUR CLEAR 07/07/2017  2005   LABSPEC 1.018 07/07/2017 2005   PHURINE 5.0 07/07/2017 2005   GLUCOSEU NEGATIVE 07/07/2017 2005   HGBUR NEGATIVE 07/07/2017 2005   BILIRUBINUR NEGATIVE 07/07/2017 2005   KETONESUR 20 (A) 07/07/2017 2005   PROTEINUR NEGATIVE 07/07/2017 2005   NITRITE NEGATIVE 07/07/2017 2005   LEUKOCYTESUR TRACE (A) 07/07/2017 2005   Sepsis Labs: !!!!!!!!!!!!!!!!!!!!!!!!!!!!!!!!!!!!!!!!!!!! @LABRCNTIP (procalcitonin:4,lacticidven:4) )No results found for this or any previous visit (from the past 240 hour(s)).   Radiological Exams on Admission: Ct Abdomen Pelvis Wo Contrast  Result Date: 07/08/2017 CLINICAL DATA:  72 year old male with nausea vomiting. EXAM: CT CHEST, ABDOMEN AND PELVIS WITHOUT CONTRAST TECHNIQUE: Multidetector CT imaging of the chest, abdomen and pelvis was performed following the  standard protocol without IV contrast. COMPARISON:  Radiograph of the chest abdomen pelvis dated 07/07/2017 FINDINGS: Evaluation of this exam is limited in the absence of intravenous contrast. CT CHEST FINDINGS Cardiovascular: There is no cardiomegaly. Coronary vascular calcification primarily involving the LAD. Small anterior pericardial effusion measuring 8 mm in thickness. There is mild atherosclerotic calcification of the thoracic aorta. The thoracic aorta and central pulmonary arteries are grossly unremarkable on this noncontrast CT. Mediastinum/Nodes: No hilar or mediastinal adenopathy. The esophagus contains a mixed density fluid content with areas of higher attenuation, likely residual oral contrast. Findings likely represent reflux or esophageal dysmotility and delayed emptying. Blood product is less likely. Clinical correlation is recommended. Endoscopy may provide better evaluation of the esophagus if clinically indicated. Lungs/Pleura: There is emphysematous changes of the lungs. Patchy area of nodular airspace density primarily involving the right middle lobe and anterior right upper lobe most consistent  with pneumonia. Clinical correlation and follow-up to resolution recommended. There is mild bronchiectatic changes. No pneumothorax or pleural effusion. Endobronchial content in the region of the carina and proximal portion of the left mainstem bronchus likely mucous plugging. Musculoskeletal: There is degenerative changes of the spine. Old healed left rib fractures. No acute osseous pathology. CT ABDOMEN PELVIS FINDINGS No intra-abdominal free air or free fluid. Hepatobiliary: The liver is unremarkable. No intrahepatic biliary ductal dilatation. There is probable sludge or small stones within the gallbladder. No pericholecystic fluid or evidence of acute cholecystitis by CT. Pancreas: Unremarkable. No pancreatic ductal dilatation or surrounding inflammatory changes. Spleen: Normal in size without focal abnormality. Adrenals/Urinary Tract: The adrenal glands are unremarkable. Multiple bilateral renal hypodense lesions are not characterized on this CT but appear to demonstrate fluid attenuation. Ultrasound may provide better characterisation. Punctate nonobstructing right renal calculi versus vascular calcification. There is no hydronephrosis or obstructing stone on either side. The visualized ureters and urinary bladder appear unremarkable. Stomach/Bowel: There is a small hiatal hernia. Oral contrast opacifies multiple loops of small bowel. There is no evidence of bowel obstruction or active inflammation. There is sigmoid diverticulosis without active inflammation. Normal appendix. Vascular/Lymphatic: Advanced aortoiliac atherosclerotic disease. The IVC is grossly unremarkable. No portal venous gas. There is no adenopathy. Reproductive: The prostate and seminal vesicles are grossly unremarkable. Other: None Musculoskeletal: Osteopenia with degenerative changes of the lower lumbar spine. No acute osseous pathology. IMPRESSION: 1. Patchy areas of nodular airspace density primarily in the right middle lobe and right  upper lobe most consistent with pneumonia. Clinical correlation and follow-up to resolution after treatment recommended. 2. Emphysema.  No pleural effusion or pneumothorax. 3. Mucous plugging in the proximal left mainstem bronchus. 4. Sigmoid diverticulosis. No bowel obstruction or active inflammation. Normal appendix. 5. Bilateral renal hypodense lesions, incompletely characterized. Ultrasound may provide better evaluation. No hydronephrosis or obstructing stone. 6. Aortic Atherosclerosis (ICD10-I70.0) and Emphysema (ICD10-J43.9). Electronically Signed   By: Elgie Collard M.D.   On: 07/08/2017 05:47   Ct Chest Wo Contrast  Result Date: 07/08/2017 CLINICAL DATA:  72 year old male with nausea vomiting. EXAM: CT CHEST, ABDOMEN AND PELVIS WITHOUT CONTRAST TECHNIQUE: Multidetector CT imaging of the chest, abdomen and pelvis was performed following the standard protocol without IV contrast. COMPARISON:  Radiograph of the chest abdomen pelvis dated 07/07/2017 FINDINGS: Evaluation of this exam is limited in the absence of intravenous contrast. CT CHEST FINDINGS Cardiovascular: There is no cardiomegaly. Coronary vascular calcification primarily involving the LAD. Small anterior pericardial effusion measuring 8 mm in thickness. There is mild atherosclerotic calcification of the thoracic aorta.  The thoracic aorta and central pulmonary arteries are grossly unremarkable on this noncontrast CT. Mediastinum/Nodes: No hilar or mediastinal adenopathy. The esophagus contains a mixed density fluid content with areas of higher attenuation, likely residual oral contrast. Findings likely represent reflux or esophageal dysmotility and delayed emptying. Blood product is less likely. Clinical correlation is recommended. Endoscopy may provide better evaluation of the esophagus if clinically indicated. Lungs/Pleura: There is emphysematous changes of the lungs. Patchy area of nodular airspace density primarily involving the right  middle lobe and anterior right upper lobe most consistent with pneumonia. Clinical correlation and follow-up to resolution recommended. There is mild bronchiectatic changes. No pneumothorax or pleural effusion. Endobronchial content in the region of the carina and proximal portion of the left mainstem bronchus likely mucous plugging. Musculoskeletal: There is degenerative changes of the spine. Old healed left rib fractures. No acute osseous pathology. CT ABDOMEN PELVIS FINDINGS No intra-abdominal free air or free fluid. Hepatobiliary: The liver is unremarkable. No intrahepatic biliary ductal dilatation. There is probable sludge or small stones within the gallbladder. No pericholecystic fluid or evidence of acute cholecystitis by CT. Pancreas: Unremarkable. No pancreatic ductal dilatation or surrounding inflammatory changes. Spleen: Normal in size without focal abnormality. Adrenals/Urinary Tract: The adrenal glands are unremarkable. Multiple bilateral renal hypodense lesions are not characterized on this CT but appear to demonstrate fluid attenuation. Ultrasound may provide better characterisation. Punctate nonobstructing right renal calculi versus vascular calcification. There is no hydronephrosis or obstructing stone on either side. The visualized ureters and urinary bladder appear unremarkable. Stomach/Bowel: There is a small hiatal hernia. Oral contrast opacifies multiple loops of small bowel. There is no evidence of bowel obstruction or active inflammation. There is sigmoid diverticulosis without active inflammation. Normal appendix. Vascular/Lymphatic: Advanced aortoiliac atherosclerotic disease. The IVC is grossly unremarkable. No portal venous gas. There is no adenopathy. Reproductive: The prostate and seminal vesicles are grossly unremarkable. Other: None Musculoskeletal: Osteopenia with degenerative changes of the lower lumbar spine. No acute osseous pathology. IMPRESSION: 1. Patchy areas of nodular  airspace density primarily in the right middle lobe and right upper lobe most consistent with pneumonia. Clinical correlation and follow-up to resolution after treatment recommended. 2. Emphysema.  No pleural effusion or pneumothorax. 3. Mucous plugging in the proximal left mainstem bronchus. 4. Sigmoid diverticulosis. No bowel obstruction or active inflammation. Normal appendix. 5. Bilateral renal hypodense lesions, incompletely characterized. Ultrasound may provide better evaluation. No hydronephrosis or obstructing stone. 6. Aortic Atherosclerosis (ICD10-I70.0) and Emphysema (ICD10-J43.9). Electronically Signed   By: Elgie Collard M.D.   On: 07/08/2017 05:47   Dg Abd Acute W/chest  Result Date: 07/08/2017 CLINICAL DATA:  Vomiting for 10 days.  Weight loss. EXAM: DG ABDOMEN ACUTE W/ 1V CHEST COMPARISON:  Chest radiograph 01/13/2016 FINDINGS: The lungs are hyperexpanded with multiple areas of increased lucency. No focal airspace consolidation or pulmonary edema. No pneumothorax or sizable pleural effusion. No free intraperitoneal air. No dilated loops of bowel are visible. The round densities overlying the left psoas muscle may be within the transverse colon. IMPRESSION: 1. COPD without acute airspace disease. 2. No free intraperitoneal air or evidence of small-bowel obstruction. Electronically Signed   By: Deatra Robinson M.D.   On: 07/08/2017 00:07    Old chart reviewed Case discussed with dr gardner  Assessment/Plan 72 yo male with n/v of unclear etiology   Principal Problem:   Intractable nausea and vomiting- labs nml.  Imagine unrevealing.  Will place on PPI.  He does show pna on his ct but  unsure if this is related to his n/v.  abd exam is normal.  Prn phenergan.  Ivf.  Full liq diet.  Active Problems:   CAP (community acquired pneumonia)- iv rocephin and azithro   Essential hypertension- stable   COPD with hypoxia (HCC)- stable   Chronic respiratory failure with hypoxia (HCC)- stable,  afvss at this time    DVT prophylaxis:  scds Code Status:  full Family Communication:  none Disposition Plan:  Per day team Consults called:  none Admission status:  observation   George Stokes A MD [facility]  If 7PM-7AM, please contact night-coverage [email or web address] Password [facility]  07/08/2017, 8:39 AM

## 2017-07-08 NOTE — ED Notes (Signed)
Paged Jenne Pane, MD about patients n/v.

## 2017-07-08 NOTE — Care Management Obs Status (Signed)
MEDICARE OBSERVATION STATUS NOTIFICATION   Patient Details  Name: George Stokes MRN: [id number] Date of Birth: 05-27-1946   Medicare Observation Status Notification Given:  Yes    Dannisha Eckmann, Benjaman Lobe, RN 07/08/2017, 10:31 AM

## 2017-07-09 ENCOUNTER — Inpatient Hospital Stay (HOSPITAL_COMMUNITY): Payer: Medicare Other

## 2017-07-09 DIAGNOSIS — J181 Lobar pneumonia, unspecified organism: Secondary | ICD-10-CM

## 2017-07-09 LAB — BASIC METABOLIC PANEL
ANION GAP: 13 (ref 5–15)
BUN: 26 mg/dL — ABNORMAL HIGH (ref 6–20)
CALCIUM: 8.8 mg/dL — AB (ref 8.9–10.3)
CO2: 27 mmol/L (ref 22–32)
CREATININE: 1.19 mg/dL (ref 0.61–1.24)
Chloride: 101 mmol/L (ref 101–111)
GFR calc non Af Amer: >60 mL/min (ref >60)
Glucose, Bld: 59 mg/dL — ABNORMAL LOW (ref 65–99)
Potassium: 3.7 mmol/L (ref 3.5–5.1)
SODIUM: 141 mmol/L (ref 135–145)

## 2017-07-09 LAB — CBC WITH DIFFERENTIAL/PLATELET
BASOS PCT: 1 %
Basophils Absolute: 0 10*3/uL (ref 0.0–0.1)
Eosinophils Absolute: 0.3 10*3/uL (ref 0.0–0.7)
Eosinophils Relative: 3 %
HEMATOCRIT: 42 % (ref 39.0–52.0)
HEMOGLOBIN: 14.2 g/dL (ref 13.0–17.0)
Lymphocytes Relative: 10 %
Lymphs Abs: 0.8 10*3/uL (ref 0.7–4.0)
MCH: 30 pg (ref 26.0–34.0)
MCHC: 33.8 g/dL (ref 30.0–36.0)
MCV: 88.8 fL (ref 78.0–100.0)
Monocytes Absolute: 0.6 10*3/uL (ref 0.1–1.0)
Monocytes Relative: 7 %
NEUTROS ABS: 6.9 10*3/uL (ref 1.7–7.7)
NEUTROS PCT: 79 %
Platelets: 178 10*3/uL (ref 150–400)
RBC: 4.73 MIL/uL (ref 4.22–5.81)
RDW: 14.1 % (ref 11.5–15.5)
WBC: 8.7 10*3/uL (ref 4.0–10.5)

## 2017-07-09 LAB — RAPID URINE DRUG SCREEN, HOSP PERFORMED
AMPHETAMINES: NOT DETECTED
BENZODIAZEPINES: NOT DETECTED
Barbiturates: NOT DETECTED
COCAINE: NOT DETECTED
Opiates: NOT DETECTED
TETRAHYDROCANNABINOL: NOT DETECTED

## 2017-07-09 LAB — LIPASE, BLOOD: Lipase: 53 U/L — ABNORMAL HIGH (ref 11–51)

## 2017-07-09 LAB — STREP PNEUMONIAE URINARY ANTIGEN: Strep Pneumo Urinary Antigen: NEGATIVE

## 2017-07-09 MED ORDER — GUAIFENESIN ER 600 MG PO TB12
600.0000 mg | ORAL_TABLET | Freq: Two times a day (BID) | ORAL | Status: DC
  Administered 2017-07-10 – 2017-07-15 (×7): 600 mg via ORAL
  Filled 2017-07-09 (×10): qty 1

## 2017-07-09 MED ORDER — PROMETHAZINE HCL 25 MG/ML IJ SOLN
6.2500 mg | Freq: Four times a day (QID) | INTRAMUSCULAR | Status: DC | PRN
  Administered 2017-07-09 – 2017-07-19 (×10): 6.25 mg via INTRAVENOUS
  Filled 2017-07-09 (×12): qty 1

## 2017-07-09 MED ORDER — IPRATROPIUM-ALBUTEROL 0.5-2.5 (3) MG/3ML IN SOLN
3.0000 mL | Freq: Two times a day (BID) | RESPIRATORY_TRACT | Status: DC
  Administered 2017-07-09 – 2017-07-10 (×2): 3 mL via RESPIRATORY_TRACT
  Filled 2017-07-09 (×2): qty 3

## 2017-07-09 MED ORDER — PANTOPRAZOLE SODIUM 40 MG IV SOLR
40.0000 mg | Freq: Two times a day (BID) | INTRAVENOUS | Status: DC
  Administered 2017-07-09 – 2017-08-02 (×48): 40 mg via INTRAVENOUS
  Filled 2017-07-09 (×47): qty 40

## 2017-07-09 MED ORDER — KCL IN DEXTROSE-NACL 10-5-0.45 MEQ/L-%-% IV SOLN
INTRAVENOUS | Status: DC
  Administered 2017-07-09 – 2017-07-12 (×5): via INTRAVENOUS
  Filled 2017-07-09 (×6): qty 1000

## 2017-07-09 NOTE — Progress Notes (Signed)
PROGRESS NOTE  Giovann Salahuddin MVH:[id number] DOB: Aug 30, 1945 DOA: 07/07/2017 PCP: Patient, No Pcp Per  HPI/Recap of past 24 hours:  Continue to have n/v, not able to keep anything down  Assessment/Plan: Principal Problem:   Intractable nausea and vomiting Active Problems:   Essential hypertension   COPD with hypoxia (HCC)   Chronic respiratory failure with hypoxia (HCC)   CAP (community acquired pneumonia)  Long term alcoholic ( drink votka daily). He stopped drinking a month ago due to hiccups and intractable n/v, he has weight loss -Denies abdominal pain, no fever  -ct ab/pel no bowel obstruction, no acute findings -Ppi/ivf/antiemetics -Consider Gi consult if symptom does not improve  Pneumonia,  likely aspiration pneumonia from intractable n/v -Urine strep pneumo antigen negative -Ct chest "Patchy areas of nodular airspace density primarily in the right middle lobe and right upper lobe most consistent with pneumonia. Clinical correlation and follow-up to resolution after treatment recommended." "Mucous plugging in the proximal left mainstem bronchus" -on Abx, start mucinex/nebs - Leukocytosis likely fron penumonia and dehydration, improving  AKI -likely from dehydration -ua no infection -Continue hydration  Bilateral renal hypodense lesions on CT scan ua no blood Renal US pending  02 dependent COPD: Stable, no wheezing, though lung sounds very diminished Quit smoking in 2010   H/o gout; stable  Malnutrition: likely from chronic alcohol. No malignancy by Ct scan nutrition consult once able to eat consistantly Body mass index is 18.79 kg/m.  alcohol use, stopped a month ago due to n/v Mild elevated tibili, lipase, ct hepatobiliary/pancrease no acute finding, he has no pain.  Code Status: full  Family Communication: patient   Disposition Plan: home once medically  stable   Consultants:  none  Procedures:  none  Antibiotics:  Rocephin/zithro   Objective: BP 127/76 (BP Location: Left Arm)   Pulse 83   Temp 97.9 F (36.6 C) (Oral)   Resp 18   Ht 5\' 7"  (1.702 m)   Wt 54.4 kg (120 lb)   SpO2 93%   BMI 18.79 kg/m   Intake/Output Summary (Last 24 hours) at 07/09/2017 1002 Last data filed at 07/09/2017 0857 Gross per 24 hour  Intake 3093.75 ml  Output 350 ml  Net 2743.75 ml   Filed Weights   07/07/17 1543  Weight: 54.4 kg (120 lb)    Exam: Patient is examined daily including today on 07/09/2017, exams remain the same as of yesterday except that has changed    General:  NAD, malnourished , anxious  Cardiovascular: RRR  Respiratory: CTABL  Abdomen: Soft/ND/NT, positive BS  Musculoskeletal: No Edema  Neuro: alert, oriented   Data Reviewed: Basic Metabolic Panel: Recent Labs  Lab 07/07/17 1550 07/09/17 0351  NA 137 141  K 4.4 3.7  CL 92* 101  CO2 29 27  GLUCOSE 104* 59*  BUN 47* 26*  CREATININE 2.08* 1.19  CALCIUM 9.8 8.8*   Liver Function Tests: Recent Labs  Lab 07/07/17 1550  AST 26  ALT 22  ALKPHOS 96  BILITOT 1.4*  PROT 7.1  ALBUMIN 3.9   Recent Labs  Lab 07/07/17 1550 07/09/17 0347  LIPASE 52* 53*   No results for input(s): AMMONIA in the last 168 hours. CBC: Recent Labs  Lab 07/07/17 1550 07/09/17 0351  WBC 17.3* 8.7  NEUTROABS  --  6.9  HGB 17.6* 14.2  HCT 51.0 42.0  MCV 88.1 88.8  PLT 266 178   Cardiac Enzymes:   No results for input(s): CKTOTAL, CKMB, CKMBINDEX, TROPONINI in  the last 168 hours. BNP (last 3 results) No results for input(s): BNP in the last 8760 hours.  ProBNP (last 3 results) No results for input(s): PROBNP in the last 8760 hours.  CBG: No results for input(s): GLUCAP in the last 168 hours.  No results found for this or any previous visit (from the past 240 hour(s)).   Studies: No results found.  Scheduled Meds: . pantoprazole (PROTONIX) IV  40 mg  Intravenous Q12H    Continuous Infusions: . azithromycin Stopped (07/08/17 1250)  . cefTRIAXone (ROCEPHIN)  IV 1 g (07/09/17 8119)  . dextrose 5 % and 0.45 % NaCl with KCl 10 mEq/L       Time spent: 35 mins I have personally reviewed and interpreted on  07/09/2017 daily labs, imagings as discussed above under date review session and assessment and plans.  I reviewed all nursing notes, pharmacy notes, vitals, pertinent old records  I have discussed plan of care as described above with RN , patient  on 07/09/2017   Albertine Grates MD, PhD  [facility] Pager [phone]. If 7PM-7AM, please contact night-coverage at [email or web address], password [facility] 07/09/2017, 10:02 AM  LOS: 1 day

## 2017-07-09 NOTE — Progress Notes (Signed)
Pt refuses to allow the bed alarm on . 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 He has been instructed to call for assistance if her needs to go to the BR. He agreed. Safety protocol was discussed  Each time his room was entered

## 2017-07-10 DIAGNOSIS — E871 Hypo-osmolality and hyponatremia: Secondary | ICD-10-CM

## 2017-07-10 LAB — MAGNESIUM: Magnesium: 1.2 mg/dL — ABNORMAL LOW (ref 1.7–2.4)

## 2017-07-10 LAB — CBC WITH DIFFERENTIAL/PLATELET
BASOS PCT: 0 %
Basophils Absolute: 0 10*3/uL (ref 0.0–0.1)
EOS ABS: 0.3 10*3/uL (ref 0.0–0.7)
Eosinophils Relative: 3 %
HEMATOCRIT: 41.9 % (ref 39.0–52.0)
HEMOGLOBIN: 13.8 g/dL (ref 13.0–17.0)
LYMPHS ABS: 0.9 10*3/uL (ref 0.7–4.0)
Lymphocytes Relative: 11 %
MCH: 29.4 pg (ref 26.0–34.0)
MCHC: 32.9 g/dL (ref 30.0–36.0)
MCV: 89.3 fL (ref 78.0–100.0)
Monocytes Absolute: 0.8 10*3/uL (ref 0.1–1.0)
Monocytes Relative: 10 %
NEUTROS ABS: 6.5 10*3/uL (ref 1.7–7.7)
NEUTROS PCT: 76 %
Platelets: 190 10*3/uL (ref 150–400)
RBC: 4.69 MIL/uL (ref 4.22–5.81)
RDW: 13.8 % (ref 11.5–15.5)
WBC: 8.5 10*3/uL (ref 4.0–10.5)

## 2017-07-10 LAB — COMPREHENSIVE METABOLIC PANEL
ALBUMIN: 2.9 g/dL — AB (ref 3.5–5.0)
ALK PHOS: 62 U/L (ref 38–126)
ALT: 16 U/L — AB (ref 17–63)
AST: 24 U/L (ref 15–41)
Anion gap: 8 (ref 5–15)
BUN: 12 mg/dL (ref 6–20)
CALCIUM: 8.7 mg/dL — AB (ref 8.9–10.3)
CO2: 31 mmol/L (ref 22–32)
Chloride: 99 mmol/L — ABNORMAL LOW (ref 101–111)
Creatinine, Ser: 1.01 mg/dL (ref 0.61–1.24)
GFR calc Af Amer: >60 mL/min (ref >60)
GFR calc non Af Amer: >60 mL/min (ref >60)
GLUCOSE: 99 mg/dL (ref 65–99)
Potassium: 3.8 mmol/L (ref 3.5–5.1)
SODIUM: 138 mmol/L (ref 135–145)
Total Bilirubin: 1 mg/dL (ref 0.3–1.2)
Total Protein: 5.2 g/dL — ABNORMAL LOW (ref 6.5–8.1)

## 2017-07-10 LAB — URIC ACID: Uric Acid, Serum: 9.8 mg/dL — ABNORMAL HIGH (ref 4.4–7.6)

## 2017-07-10 MED ORDER — MAGNESIUM SULFATE 4 GM/100ML IV SOLN
4.0000 g | Freq: Once | INTRAVENOUS | Status: AC
  Administered 2017-07-10: 4 g via INTRAVENOUS
  Filled 2017-07-10: qty 100

## 2017-07-10 MED ORDER — IPRATROPIUM-ALBUTEROL 0.5-2.5 (3) MG/3ML IN SOLN
3.0000 mL | Freq: Three times a day (TID) | RESPIRATORY_TRACT | Status: DC
  Administered 2017-07-10 (×2): 3 mL via RESPIRATORY_TRACT
  Filled 2017-07-10 (×3): qty 3

## 2017-07-10 MED ORDER — ENSURE ENLIVE PO LIQD
237.0000 mL | Freq: Two times a day (BID) | ORAL | Status: DC
  Administered 2017-07-10: 237 mL via ORAL

## 2017-07-10 MED ORDER — ADULT MULTIVITAMIN W/MINERALS CH
1.0000 | ORAL_TABLET | Freq: Every day | ORAL | Status: DC
  Administered 2017-07-10 – 2017-07-14 (×3): 1 via ORAL
  Filled 2017-07-10 (×5): qty 1

## 2017-07-10 MED ORDER — LIP MEDEX EX OINT
TOPICAL_OINTMENT | CUTANEOUS | Status: AC
Start: 2017-07-10 — End: 2017-07-10
  Administered 2017-07-10: 18:00:00
  Filled 2017-07-10: qty 7

## 2017-07-10 NOTE — Progress Notes (Signed)
100 ml  emesis  light color  frothy  a emesis bag  . Pt will be given a basiin so emesis can be observed

## 2017-07-10 NOTE — Progress Notes (Signed)
Pt encourage to sit in a chair and declined. Encouraged to walk in his room.  He refuses to wear clothes

## 2017-07-10 NOTE — Progress Notes (Signed)
Initial Nutrition Assessment  DOCUMENTATION CODES:   Non-severe (moderate) malnutrition in context of chronic illness  INTERVENTION:   -Provide Ensure Enlive po BID, each supplement provides 350 kcal and 20 grams of protein -Provide Magic cup BID with meals, each supplement provides 290 kcal and 9 grams of protein -Provide Multivitamin with minerals daily -Encourage PO intake  -RD will continue to monitor  NUTRITION DIAGNOSIS:   Moderate Malnutrition related to chronic illness, nausea, vomiting(ETOH abuse) as evidenced by percent weight loss, energy intake < or equal to 75% for > or equal to 1 month, moderate fat depletion, moderate muscle depletion.  GOAL:   Patient will meet greater than or equal to 90% of their needs  MONITOR:   PO intake, Supplement acceptance, Weight trends, Labs, I & O's  REASON FOR ASSESSMENT:   Malnutrition Screening Tool    ASSESSMENT:   72 y.o. male with medical history significant of COPD, CRF on 4 liters he thinks at home, htn comes in with over a week of nausea and vomiting with no abdominal pain or blood in vomit.  No fevers.  No diarrhea. No cough.  No chest pain.  He says he gets nauseated even after drinking liquids.  Pt referred for admission for report of wt loss and his n/v.  Pt reports poor appetite and very little PO for 10 days now. Pt developed nausea and occasional vomiting d/t "regurgitation". Pt with breakfast tray sitting at bedside, mostly untouched. Ate bites of grits. Still c/o nausea, states nausea meds aren't working. Per chart review, pt has stopped drinking ETOH ~ 1 month ago. Pt states prior to this recent episode, he wasn't eating meals, mainly grazing throughout the day on beans, sandwiches, raw vegetables, etc.  Pt is willing to try Ensure supplements and Magic Cups.  Per chart review, pt has lost 40 lb since 01/04/17 (25% wt loss x 6 months, significant for time frame).  Medications: IV Protonix every 12 hours, D5 and  .45% NaCl w/ KCl infusion at 100 ml/hr -provides 408 kcal, Mg sulfate once, IV Phenergan PRN Labs reviewed: Low Mg   NUTRITION - FOCUSED PHYSICAL EXAM:    Most Recent Value  Orbital Region  Mild depletion  Upper Arm Region  Moderate depletion  Thoracic and Lumbar Region  Moderate depletion  Buccal Region  Mild depletion  Temple Region  Mild depletion  Clavicle Bone Region  Moderate depletion  Clavicle and Acromion Bone Region  Moderate depletion  Scapular Bone Region  Moderate depletion  Dorsal Hand  Moderate depletion  Patellar Region  Unable to assess  Anterior Thigh Region  Unable to assess  Posterior Calf Region  Unable to assess  Edema (RD Assessment)  None       Diet Order:  Diet full liquid Room service appropriate? Yes; Fluid consistency: Thin  EDUCATION NEEDS:   Education needs have been addressed  Skin:  Skin Assessment: Reviewed RN Assessment  Last BM:  PTA  Height:   Ht Readings from Last 1 Encounters:  07/07/17 5\' 7"  (1.702 m)    Weight:   Wt Readings from Last 1 Encounters:  07/07/17 120 lb (54.4 kg)    Ideal Body Weight:  67.3 kg  BMI:  Body mass index is 18.79 kg/m.  Estimated Nutritional Needs:   Kcal:  1900-2100  Protein:  90-100g  Fluid:  2L/day   [name], MS, RD, LDN [facility] Pager: [phone] After Hours Pager: [phone]

## 2017-07-10 NOTE — Progress Notes (Signed)
Pt ambulated around his room for 5 minuted and encourage to get oob and sit in a chair and walk.

## 2017-07-10 NOTE — Progress Notes (Signed)
PROGRESS NOTE  George Stokes NWG:[id number] DOB: 1946/02/05 DOA: 07/07/2017 PCP: Patient, No Pcp Per  HPI/Recap of past 24 hours:   Poor historian Reports continue to feel nauseas with regurg when he takes pills or drink fluids, he refused pills by mouth, he prefers all meds per iv.  He denies pain   Assessment/Plan: Principal Problem:   Intractable nausea and vomiting Active Problems:   Essential hypertension   COPD with hypoxia (HCC)   Chronic respiratory failure with hypoxia (HCC)   CAP (community acquired pneumonia)   Lobar pneumonia (HCC)  Long term alcoholic ( drink votka daily). He stopped drinking a month ago due to hiccups and intractable n/v, he has weight loss -Denies abdominal pain, no fever  -ct ab/pel no bowel obstruction, no acute findings -Ppi/ivf/antiemetics -not improving, continue ivf, will get dg esophagus -Consider Gi consult if symptom does not improve  Pneumonia,  likely aspiration pneumonia from intractable n/v -Urine strep pneumo antigen negative -Ct chest "Patchy areas of nodular airspace density primarily in the right middle lobe and right upper lobe most consistent with pneumonia. Clinical correlation and follow-up to resolution after treatment recommended." "Mucous plugging in the proximal left mainstem bronchus" -on Abx, start mucinex/nebs, he refused mucinex - Leukocytosis likely fron penumonia and dehydration, normalized  AKI -likely from dehydration -ua no infection -Continue hydration  Hypomagnesemia: replace mag.  Bilateral renal hypodense lesions on CT scan ua no blood Renal US "Left renal cysts." no acute findings.  02 dependent COPD: Stable, no wheezing, though lung sounds very diminished Quit smoking in 2010   H/o gout; stable Uric acid elevated at 9.8 Plan to d/c on allopurinol, will discuss with patient.  Malnutrition: likely from chronic alcohol. No malignancy by Ct scan nutrition consult once able to eat  consistantly Body mass index is 18.79 kg/m.  alcohol use, stopped a month ago due to n/v Mild elevated tibili, lipase, ct hepatobiliary/pancrease no acute finding, he has no pain.  Code Status: full  Family Communication: patient   Disposition Plan: home once medically stable  May need home health, very deconditioned  Consultants:  none  Procedures:  none  Antibiotics:  Rocephin/zithro   Objective: BP (!) 145/89 (BP Location: Left Arm)   Pulse 88   Temp 97.8 F (36.6 C) (Oral)   Resp 20   Ht 5\' 7"  (1.702 m)   Wt 54.4 kg (120 lb)   SpO2 90%   BMI 18.79 kg/m   Intake/Output Summary (Last 24 hours) at 07/10/2017 0807 Last data filed at 07/10/2017 5784 Gross per 24 hour  Intake 2210 ml  Output 1625 ml  Net 585 ml   Filed Weights   07/07/17 1543  Weight: 54.4 kg (120 lb)    Exam: Patient is examined daily including today on 07/10/2017, exams remain the same as of yesterday except that has changed    General:  Frail, chronically ill appearing, malnourished , anxious  Cardiovascular: RRR  Respiratory: very diminished overall, no wheezing, no rhonchi, no rales  Abdomen: Soft/ND/NT, positive BS  Musculoskeletal: No Edema  Neuro: alert, oriented   Data Reviewed: Basic Metabolic Panel: Recent Labs  Lab 07/07/17 1550 07/09/17 0351 07/10/17 0349  NA 137 141 138  K 4.4 3.7 3.8  CL 92* 101 99*  CO2 29 27 31   GLUCOSE 104* 59* 99  BUN 47* 26* 12  CREATININE 2.08* 1.19 1.01  CALCIUM 9.8 8.8* 8.7*  MG  --   --  1.2*   Liver Function Tests: Recent  Labs  Lab 07/07/17 1550 07/10/17 0349  AST 26 24  ALT 22 16*  ALKPHOS 96 62  BILITOT 1.4* 1.0  PROT 7.1 5.2*  ALBUMIN 3.9 2.9*   Recent Labs  Lab 07/07/17 1550 07/09/17 0347  LIPASE 52* 53*   No results for input(s): AMMONIA in the last 168 hours. CBC: Recent Labs  Lab 07/07/17 1550 07/09/17 0351 07/10/17 0349  WBC 17.3* 8.7 8.5  NEUTROABS  --  6.9 6.5  HGB 17.6* 14.2 13.8  HCT 51.0  42.0 41.9  MCV 88.1 88.8 89.3  PLT 266 178 190   Cardiac Enzymes:   No results for input(s): CKTOTAL, CKMB, CKMBINDEX, TROPONINI in the last 168 hours. BNP (last 3 results) No results for input(s): BNP in the last 8760 hours.  ProBNP (last 3 results) No results for input(s): PROBNP in the last 8760 hours.  CBG: No results for input(s): GLUCAP in the last 168 hours.  Recent Results (from the past 240 hour(s))  Culture, blood (routine x 2) Call MD if unable to obtain prior to antibiotics being given     Status: None (Preliminary result)   Collection Time: 07/08/17 10:41 AM  Result Value Ref Range Status   Specimen Description BLOOD LEFT ANTECUBITAL  Final   Special Requests   Final    BOTTLES DRAWN AEROBIC AND ANAEROBIC Blood Culture adequate volume   Culture   Final    NO GROWTH 1 DAY Performed at [facility], [address], [place], [place] [address]    Report Status PENDING  Incomplete  Culture, blood (routine x 2) Call MD if unable to obtain prior to antibiotics being given     Status: None (Preliminary result)   Collection Time: 07/08/17 11:20 AM  Result Value Ref Range Status   Specimen Description BLOOD BLOOD RIGHT FOREARM  Final   Special Requests   Final    BOTTLES DRAWN AEROBIC AND ANAEROBIC Blood Culture adequate volume   Culture   Final    NO GROWTH < 24 HOURS Performed at [facility], [address], [place], [place] [address]    Report Status PENDING  Incomplete     Studies: US Renal  Result Date: 07/09/2017 CLINICAL DATA:  Elevated creatinine EXAM: RENAL / URINARY TRACT ULTRASOUND COMPLETE COMPARISON:  CT 07/08/2017 FINDINGS: Right Kidney: Length: 10.6 cm. Echogenicity within normal limits. No mass or hydronephrosis visualized. Left Kidney: Length: 10.3 cm. Multiple cysts in the left kidney, the largest 4.5 cm. These appear benign. No hydronephrosis. Bladder: Appears normal for degree of bladder distention. IMPRESSION: Left renal cysts. No acute  findings.  No hydronephrosis. Electronically Signed   By: Charlett Nose M.D.   On: 07/09/2017 15:56    Scheduled Meds: . guaiFENesin  600 mg Oral BID  . ipratropium-albuterol  3 mL Nebulization BID  . pantoprazole (PROTONIX) IV  40 mg Intravenous Q12H    Continuous Infusions: . azithromycin Stopped (07/09/17 1203)  . cefTRIAXone (ROCEPHIN)  IV Stopped (07/09/17 1008)  . dextrose 5 % and 0.45 % NaCl with KCl 10 mEq/L 100 mL/hr at 07/10/17 0536  . magnesium sulfate 1 - 4 g bolus IVPB       Time spent: 35 mins I have personally reviewed and interpreted on  07/10/2017 daily labs.  I reviewed all nursing notes, pharmacy notes, vitals, pertinent old records  I have discussed plan of care as described above with RN , patient  on 07/10/2017   Albertine Grates MD, PhD  [facility] Pager [phone]. If 7PM-7AM, please contact night-coverage at [email or web address], password [facility] 07/10/2017, 8:07 AM  LOS: 2 days

## 2017-07-11 ENCOUNTER — Inpatient Hospital Stay (HOSPITAL_COMMUNITY): Payer: Medicare Other

## 2017-07-11 DIAGNOSIS — E876 Hypokalemia: Secondary | ICD-10-CM

## 2017-07-11 DIAGNOSIS — R112 Nausea with vomiting, unspecified: Secondary | ICD-10-CM

## 2017-07-11 DIAGNOSIS — J189 Pneumonia, unspecified organism: Secondary | ICD-10-CM

## 2017-07-11 DIAGNOSIS — R933 Abnormal findings on diagnostic imaging of other parts of digestive tract: Secondary | ICD-10-CM

## 2017-07-11 DIAGNOSIS — R634 Abnormal weight loss: Secondary | ICD-10-CM

## 2017-07-11 LAB — BASIC METABOLIC PANEL
Anion gap: 9 (ref 5–15)
BUN: 6 mg/dL (ref 6–20)
CALCIUM: 8.8 mg/dL — AB (ref 8.9–10.3)
CO2: 32 mmol/L (ref 22–32)
Chloride: 93 mmol/L — ABNORMAL LOW (ref 101–111)
Creatinine, Ser: 0.91 mg/dL (ref 0.61–1.24)
GFR calc Af Amer: >60 mL/min (ref >60)
GLUCOSE: 107 mg/dL — AB (ref 65–99)
POTASSIUM: 3.2 mmol/L — AB (ref 3.5–5.1)
Sodium: 134 mmol/L — ABNORMAL LOW (ref 135–145)

## 2017-07-11 LAB — PROTIME-INR
INR: 1.06
Prothrombin Time: 13.7 seconds (ref 11.4–15.2)

## 2017-07-11 LAB — LIPASE, BLOOD: LIPASE: 49 U/L (ref 11–51)

## 2017-07-11 LAB — VITAMIN B12: Vitamin B-12: 430 pg/mL (ref 180–914)

## 2017-07-11 LAB — MAGNESIUM: Magnesium: 1.6 mg/dL — ABNORMAL LOW (ref 1.7–2.4)

## 2017-07-11 LAB — FOLATE: FOLATE: 6.9 ng/mL (ref >5.9)

## 2017-07-11 MED ORDER — SODIUM CHLORIDE 0.9 % IV SOLN
1.5000 g | Freq: Four times a day (QID) | INTRAVENOUS | Status: DC
  Administered 2017-07-11 – 2017-07-15 (×15): 1.5 g via INTRAVENOUS
  Filled 2017-07-11 (×16): qty 1.5

## 2017-07-11 MED ORDER — SODIUM CHLORIDE 0.9 % IV SOLN: INTRAVENOUS | Status: DC

## 2017-07-11 MED ORDER — POTASSIUM CHLORIDE 10 MEQ/100ML IV SOLN
10.0000 meq | INTRAVENOUS | Status: AC
  Administered 2017-07-11 (×6): 10 meq via INTRAVENOUS
  Filled 2017-07-11 (×6): qty 100

## 2017-07-11 MED ORDER — MAGNESIUM SULFATE 4 GM/100ML IV SOLN
4.0000 g | Freq: Once | INTRAVENOUS | Status: AC
  Administered 2017-07-11: 4 g via INTRAVENOUS
  Filled 2017-07-11: qty 100

## 2017-07-11 NOTE — Progress Notes (Signed)
PT Cancellation Note  Patient Details Name: Coen Miyasato MRN: [id number] DOB: [birth year]   Cancelled Treatment:    Reason Eval/Treat Not Completed: Fatigue/lethargy limiting ability to participate(pt sleeping soundly, will attempt tomorrow. )   Philomena Doheny 07/11/2017, 12:57 PM [phone]

## 2017-07-11 NOTE — Progress Notes (Signed)
Pharmacy Antibiotic Note  George Stokes is a 72 y.o. male admitted on 07/07/2017 now with aspiration PNA .  Pharmacy has been consulted for Unasyn dosing.  Hx ETOH abuse, N/V, stated recently quit ETOH one month ago  -severe esophageal stricture at GE junction, ?compression due to fat containing hernia?  Plan: Unasyn 1.5gm q6hr  Height: 5\' 7"  (170.2 cm) Weight: 120 lb (54.4 kg) IBW/kg (Calculated) : 66.1  Temp (24hrs), Avg:98.7 F (37.1 C), Min:98.1 F (36.7 C), Max:99.2 F (37.3 C)  Recent Labs  Lab 07/07/17 1550 07/09/17 0351 07/10/17 0349 07/11/17 0406  WBC 17.3* 8.7 8.5  --   CREATININE 2.08* 1.19 1.01 0.91    Estimated Creatinine Clearance: 58.9 mL/min (by C-G formula based on SCr of 0.91 mg/dL).    No Known Allergies  Antimicrobials this admission: 1/11 Ceftriaxone >> 1/14 1/11 Azithromycin >> 1/14  Dose adjustments this admission:  Microbiology results: 1/11 BCx: ngtd          HIV: ordered          Sputum: requested   Thank you for allowing pharmacy to be a part of this patient's care.  Minda Ditto 07/11/2017 1:30 PM

## 2017-07-11 NOTE — Consult Note (Signed)
Consultation  Referring Provider: Dr. Erlinda Hong      Primary Care Physician:  Patient, No Pcp Per Primary Gastroenterologist: Althia Forts         Reason for Consultation: Nausea/Vomiting, Abnormal Imaging esophagus, Weight loss              HPI:   George Stokes is a 72 y.o. male with past medical history of O2 dependent COPD, alcohol abuse and HTN, as well as others listed below, who presented to the ED on 1/11/9 with a complaint of nausea and vomiting.      At time of interview today, the patient tells me that for the past 4-6 weeks he has been having trouble with nausea and vomiting.  When describing this patient, he tells me that only small quantities of food can "sometimes make it down".  Other times , the food/liquid seems to "hit a spot" and come right back up.  Patient does describe a period of "throat spasms" every day multiple times a day, which resulted in hiccups prior to the symptoms.  Patient tells me when these "spasms" went away he then had issues with nausea and vomiting.  Patient believes he has had a weight loss of around 20-30 pounds over the past few months due to this.  He has only been able to take in 2-3 tablespoons of food per day.  Associated symptoms include complete loss of appetite per the patient.  Along with this, he has had a decrease in frequency of bowel movements explaining that he has had not had a normal bowel movement in over 10 days, he does continue with a very small amount of gas and occasional loose stool.  Patient denies abdominal pain, "or any pain at all".    Patient's social history is positive for an increase in drinking over the past 2 years.  He tells me that he drinks at least a six pack or the equivalent of that per day.  He is aware that this is too much and plans to do something about it.  He has not had anything to drink over the past 3 weeks with these episodes of nausea and vomiting.    Patient's medical history is positive for COPD for which he has  been prescribed 4 L of continuous oxygen.  Patient tells me he does not want to pay for the mobile oxygen unit and has been making it at least 50 steps at a time before having to stop and get a breath over the past few months and he cannot sleep with oxygen at home at night so he has not been doing this either.    Patient denies fever, chills, blood in his stool, melena, heartburn, reflux or symptoms that awaken him at night.  Hospital Course: Admitted with CAP on Rocephin and Azithro- thought secondary to aspiration from n/v, CT showed mized density in esophagus of fluid content with areas of higher attenuation, DG esophagus shows severe stricture  Previous Gi History: None  Past Medical History:  Diagnosis Date  . COPD (chronic obstructive pulmonary disease) ([facility] [place])   . Gout   . Hypertension     History reviewed. No pertinent surgical history.  Family History  Problem Relation Age of Onset  . Dementia Mother   . Cancer Father   . Gout Father   . Thyroid disease Sister   . Kidney disease Brother   . Gout Brother   . Diabetes Maternal Grandmother   . Stroke Maternal Grandfather  Social History   Tobacco Use  . Smoking status: Former Smoker    Packs/day: 2.00    Years: 43.00    Pack years: 86.00    Types: Cigarettes    Last attempt to quit: 05/03/2009    Years since quitting: 8.1  . Smokeless tobacco: Never Used  Substance Use Topics  . Alcohol use: Yes    Alcohol/week: 0.0 oz    Comment: 2-4  . Drug use: No    Prior to Admission medications   Medication Sig Start Date End Date Taking? Authorizing Provider  albuterol (PROAIR HFA) 108 (90 Base) MCG/ACT inhaler 2 puffs every 4 hours as needed only  if your can't catch your breath 05/03/16  Yes Tanda Rockers, MD  amLODipine (NORVASC) 10 MG tablet Take 1 tablet (10 mg total) by mouth daily. 01/04/17  Yes Sagardia, Ines Bloomer, MD  clotrimazole-betamethasone (LOTRISONE) cream Apply 1 application topically 2 (two) times  daily. 01/04/17  Yes Sagardia, Ines Bloomer, MD  indomethacin (INDOCIN) 50 MG capsule Take 1 capsule (50 mg total) by mouth every 8 (eight) hours as needed. Patient taking differently: Take 50 mg by mouth every 8 (eight) hours as needed for mild pain.  01/04/17  Yes Sagardia, Ines Bloomer, MD  ondansetron (ZOFRAN) 4 MG tablet Take 1 tablet (4 mg total) by mouth every 8 (eight) hours as needed for nausea or vomiting. 06/29/17  Yes Sagardia, Ines Bloomer, MD  SYMBICORT 160-4.5 MCG/ACT inhaler Inhale 2 puffs into the lungs 2 (two) times daily. 01/24/17  Yes Tanda Rockers, MD  HYDROcodone-acetaminophen (NORCO/VICODIN) 5-325 MG tablet Take 1 tablet by mouth every 6 (six) hours as needed. Patient not taking: Reported on 06/29/2017 05/01/15   Le, Thao P, DO  Tiotropium Bromide Monohydrate (SPIRIVA RESPIMAT) 2.5 MCG/ACT AERS Inhale 2 puffs into the lungs daily. Patient not taking: Reported on 06/29/2017 12/31/16   Tanda Rockers, MD  zolpidem (AMBIEN) 5 MG tablet Take 1 tablet daily at bedtime as needed Patient not taking: Reported on 12/31/2016 05/01/15   Rikki Spearing P, DO    Current Facility-Administered Medications  Medication Dose Route Frequency Provider Last Rate Last Dose  . albuterol (PROVENTIL) (2.5 MG/3ML) 0.083% nebulizer solution 2.5 mg  2.5 mg Nebulization Q4H PRN Etta Quill, DO      . ampicillin-sulbactam (UNASYN) 1.5 g in sodium chloride 0.9 % 50 mL IVPB  1.5 g Intravenous Q6H Green, Terri L, RPH      . dextrose 5 % and 0.45 % NaCl with KCl 10 mEq/L infusion   Intravenous Continuous Florencia Reasons, MD 100 mL/hr at 07/11/17 0802    . feeding supplement (ENSURE ENLIVE) (ENSURE ENLIVE) liquid 237 mL  237 mL Oral BID WC Florencia Reasons, MD   237 mL at 07/10/17 1734  . guaiFENesin (MUCINEX) 12 hr tablet 600 mg  600 mg Oral BID Florencia Reasons, MD   600 mg at 07/11/17 0955  . indomethacin (INDOCIN) capsule 50 mg  50 mg Oral Q8H PRN Phillips Grout, MD      . multivitamin with minerals tablet 1 tablet  1 tablet Oral Daily Florencia Reasons, MD   1 tablet at 07/11/17 0955  . pantoprazole (PROTONIX) injection 40 mg  40 mg Intravenous Q12H Florencia Reasons, MD   40 mg at 07/11/17 0955  . potassium chloride 10 mEq in 100 mL IVPB  10 mEq Intravenous Q1 Hr x 6 Florencia Reasons, MD 100 mL/hr at 07/11/17 1420 10 mEq at 07/11/17 1420  .  promethazine (PHENERGAN) injection 6.25 mg  6.25 mg Intravenous Q6H PRN Florencia Reasons, MD   6.25 mg at 07/11/17 1046    Allergies as of 07/07/2017  . (No Known Allergies)     Review of Systems:    Constitutional: No fever or chills Skin: No rash Cardiovascular: No chest pain Respiratory: Chronic SOB and DOE Gastrointestinal: See HPI and otherwise negative Genitourinary: No dysuria Neurological: No headache Musculoskeletal: No new muscle or joint pain Hematologic: No bleeding or bruising Psychiatric: No history of depression or anxiety   Physical Exam:  Vital signs in last 24 hours: Temp:  [97.6 F (36.4 C)-99.2 F (37.3 C)] 97.6 F (36.4 C) (01/14 1355) Pulse Rate:  [83-87] 83 (01/14 1355) Resp:  [16-20] 18 (01/14 1355) BP: (121-140)/(75-90) 121/75 (01/14 1355) SpO2:  [89 %-96 %] 91 % (01/14 1355) Last BM Date: (several days ago) General:   Pleasant Frail, chronically ill appearing Caucasian male appears to be in NAD,  alert and cooperative Head:  Normocephalic and atraumatic. Eyes:   PEERL, EOMI. No icterus. Conjunctiva pink. Ears:  Normal auditory acuity. Neck:  Supple Throat: Oral cavity and pharynx without inflammation, swelling or lesion.  Lungs: Respirations even and unlabored. Lungs clear to auscultation bilaterally.   No wheezes, crackles, or rhonchi.  Heart: Normal S1, S2. No MRG. Regular rate and rhythm. No peripheral edema, cyanosis or pallor.  Abdomen:  Soft, nondistended, nontender. No rebound or guarding. Normal bowel sounds. No appreciable masses or hepatomegaly. Rectal:  Not performed.  Msk:  Symmetrical without gross deformities.  Extremities:  Without edema, no deformity or joint  abnormality.  Neurologic:  Alert and  oriented x4;  grossly normal neurologically.  Skin:   Dry and intact without significant lesions or rashes. Psychiatric:  Demonstrates good judgement and reason without abnormal affect or behaviors.  LAB RESULTS: Recent Labs    07/09/17 0351 07/10/17 0349  WBC 8.7 8.5  HGB 14.2 13.8  HCT 42.0 41.9  PLT 178 190   BMET Recent Labs    07/09/17 0351 07/10/17 0349 07/11/17 0406  NA 141 138 134*  K 3.7 3.8 3.2*  CL 101 99* 93*  CO2 27 31 32  GLUCOSE 59* 99 107*  BUN 26* 12 6  CREATININE 1.19 1.01 0.91  CALCIUM 8.8* 8.7* 8.8*   LFT Recent Labs    07/10/17 0349  PROT 5.2*  ALBUMIN 2.9*  AST 24  ALT 16*  ALKPHOS 62  BILITOT 1.0   PT/INR Recent Labs    07/11/17 0406  LABPROT 13.7  INR 1.06    STUDIES: Dg Esophagus  Result Date: 07/11/2017 CLINICAL DATA:  Persistent dysphagia, nausea/vomiting, unable to tolerate more than a tablespoon of food at a time, decreased appetite EXAM: ESOPHOGRAM/BARIUM SWALLOW TECHNIQUE: Single contrast examination was performed using  thin barium. FLUOROSCOPY TIME:  Fluoroscopy Time:  3.1 minutes Radiation Exposure Index (if provided by the fluoroscopic device): 1.9 mGy Number of Acquired Spot Images: 5 COMPARISON:  CT chest abdomen pelvis dated 07/08/2017 FINDINGS: Moderate esophageal dysmotility with fluid/debris in the mid/distal esophagus. Marked narrowing/stricture of the distal esophagus just above the GE junction. After significant delay, trace contrast passed into the proximal stomach. When correlating with the recent CT, there is no evidence of esophageal mass. However, a fat containing hernia at the esophageal hiatus is present, possibly leading to extrinsic compression. IMPRESSION: Severe stricture of the distal esophagus just above the GE junction. Secondary esophageal dysmotility. When correlating with recent CT, there is no evidence of esophageal  mass. Possible extrinsic compression secondary to a  fat containing hernia. Consider endoscopy for further evaluation. Electronically Signed   By: Julian Hy M.D.   On: 07/11/2017 10:21   EXAM: CT CHEST, ABDOMEN AND PELVIS WITHOUT CONTRAST  TECHNIQUE: Multidetector CT imaging of the chest, abdomen and pelvis was performed following the standard protocol without IV contrast.  COMPARISON:  Radiograph of the chest abdomen pelvis dated 07/07/2017  FINDINGS: Evaluation of this exam is limited in the absence of intravenous contrast.  CT CHEST FINDINGS  Cardiovascular: There is no cardiomegaly. Coronary vascular calcification primarily involving the LAD. Small anterior pericardial effusion measuring 8 mm in thickness. There is mild atherosclerotic calcification of the thoracic aorta. The thoracic aorta and central pulmonary arteries are grossly unremarkable on this noncontrast CT.  Mediastinum/Nodes: No hilar or mediastinal adenopathy. The esophagus contains a mixed density fluid content with areas of higher attenuation, likely residual oral contrast. Findings likely represent reflux or esophageal dysmotility and delayed emptying. Blood product is less likely. Clinical correlation is recommended. Endoscopy may provide better evaluation of the esophagus if clinically indicated.  Lungs/Pleura: There is emphysematous changes of the lungs. Patchy area of nodular airspace density primarily involving the right middle lobe and anterior right upper lobe most consistent with pneumonia. Clinical correlation and follow-up to resolution recommended. There is mild bronchiectatic changes. No pneumothorax or pleural effusion. Endobronchial content in the region of the carina and proximal portion of the left mainstem bronchus likely mucous plugging.  Musculoskeletal: There is degenerative changes of the spine. Old healed left rib fractures. No acute osseous pathology.  CT ABDOMEN PELVIS FINDINGS  No intra-abdominal free air or  free fluid.  Hepatobiliary: The liver is unremarkable. No intrahepatic biliary ductal dilatation. There is probable sludge or small stones within the gallbladder. No pericholecystic fluid or evidence of acute cholecystitis by CT.  Pancreas: Unremarkable. No pancreatic ductal dilatation or surrounding inflammatory changes.  Spleen: Normal in size without focal abnormality.  Adrenals/Urinary Tract: The adrenal glands are unremarkable. Multiple bilateral renal hypodense lesions are not characterized on this CT but appear to demonstrate fluid attenuation. Ultrasound may provide better characterisation. Punctate nonobstructing right renal calculi versus vascular calcification. There is no hydronephrosis or obstructing stone on either side. The visualized ureters and urinary bladder appear unremarkable.  Stomach/Bowel: There is a small hiatal hernia. Oral contrast opacifies multiple loops of small bowel. There is no evidence of bowel obstruction or active inflammation. There is sigmoid diverticulosis without active inflammation. Normal appendix.  Vascular/Lymphatic: Advanced aortoiliac atherosclerotic disease. The IVC is grossly unremarkable. No portal venous gas. There is no adenopathy.  Reproductive: The prostate and seminal vesicles are grossly unremarkable.  Other: None  Musculoskeletal: Osteopenia with degenerative changes of the lower lumbar spine. No acute osseous pathology.  IMPRESSION: 1. Patchy areas of nodular airspace density primarily in the right middle lobe and right upper lobe most consistent with pneumonia. Clinical correlation and follow-up to resolution after treatment recommended. 2. Emphysema.  No pleural effusion or pneumothorax. 3. Mucous plugging in the proximal left mainstem bronchus. 4. Sigmoid diverticulosis. No bowel obstruction or active inflammation. Normal appendix. 5. Bilateral renal hypodense lesions, incompletely  characterized. Ultrasound may provide better evaluation. No hydronephrosis or obstructing stone. 6. Aortic Atherosclerosis (ICD10-I70.0) and Emphysema (ICD10-J43.9).   Electronically Signed   By: Anner Crete M.D.   On: 07/08/2017 05:47  PREVIOUS ENDOSCOPIES:            See HPI   Impression / Plan:   Impression: 1.  Abnormal Imaging esophagus: "Severe stricture on DG esophagus"; Consider stricture vs ring vs web vs mass 2. Intractable nausea and vomiting: likely due to above, sounds more like regurgitation at time of interview +/- gastritis from alcohol use 3. Etoh abuse: reports daily use, at least a 6-pack per day or the equivalent of, stopped 1 mos ago due to hiccups 4. Pneumonia: thought due to aspiration, leukocytosis thought from this, this is day 3 of abx 5. O2 dependent COPD: on 4 L at home  Plan: 1. Will plan for EGD on Wednesday to allow the patient one more day to improve respiratory status. Discussed risks, benefits, limitations and alternatives and the patient agrees to proceed. 2. Patient may be on clears as tolerated today and then NPO after midnight on Tuesday 3. Continue Protonix 40mg  IV BID 4. Continue supportive measures 5. Please await any further recommendations from Dr. Silverio Decamp later today  Thank you for your kind consultation, we will continue to follow.  Lavone Nian Lemmon  07/11/2017, 3:05 PM Pager #: [phone]   Attending physician's note   I have taken a history, examined the patient and reviewed the chart. I agree with the Advanced Practitioner's note, impression and recommendations. 72 year old male with COPD admitted with pneumonia in the setting of regurgitation, vomiting, CT showed retained fluid with debris in the distal esophagus.  Severe distal esophageal stricture on barium esophagram.  Patient reports his symptoms started all of a sudden 4 weeks ago with regurgitation and vomiting.  He had mild intermittent solid dysphagia for many  years prior to this.  He lost over 20 pounds in the past month.  He appears cachectic.  O2 sat 89-91%, does not want to use oxygen nasal cannula.  He has been refusing nebulizer /inhaler treatments by RT. We will plan for EGD to evaluate and possible dilation on Wednesday once his respiratory status is better optimized Okay to continue sips of water/clears as tolerated  K Denzil Magnuson, MD [phone] Mon-Fri 8a-5p [phone] after 5p, weekends, holidays

## 2017-07-11 NOTE — Progress Notes (Signed)
PROGRESS NOTE  George Stokes JJO:[id number] DOB: September 30, 1945 DOA: 07/07/2017 PCP: Patient, No Pcp Per  HPI/Recap of past 24 hours:  He returned for esophgram study  Poor historian Reports continue to feel nauseas with regurg when he takes pills or drink fluids, he refused pills by mouth, he prefers all meds per iv.  He denies pain, no fever   Assessment/Plan: Principal Problem:   Intractable nausea and vomiting Active Problems:   Essential hypertension   COPD with hypoxia ([facility])   Chronic respiratory failure with hypoxia ([facility])   CAP (community acquired pneumonia)   Lobar pneumonia ([facility])  Long term alcoholic ( drink votka daily). He stopped drinking a month ago due to hiccups and intractable n/v, he has weight loss -Denies abdominal pain, no fever  -ct ab/pel no bowel obstruction, no acute findings. -dg esophagus on 1/14 "Severe stricture of the distal esophagus just above the GE junction. Secondary esophageal dysmotility. When correlating with recent CT, there is no evidence of esophageal mass. Possible extrinsic compression secondary to a fat containing hernia.  Consider endoscopy for further evaluation" -[facility] consulted. Continue Ppi/ivf/antiemetics  Pneumonia,  likely aspiration pneumonia from intractable n/v -Urine strep pneumo antigen negative -Ct chest "Patchy areas of nodular airspace density primarily in the right middle lobe and right upper lobe most consistent with pneumonia. Clinical correlation and follow-up to resolution after treatment recommended." "Mucous plugging in the proximal left mainstem bronchus" -on Abx, start mucinex/nebs, he refused mucinex. He was on rocephin/zitrho, change abx to unasyn to cover aspiration pneumonia - Leukocytosis likely fron penumonia and dehydration, normalized  AKI -likely from dehydration -ua no infection -Continue hydration, cr 2.08 on presentation, has normalized.  Hypokalemia/Hypomagnesemia: replace k/mag.  Bilateral renal  hypodense lesions on CT scan ua no blood Renal US "Left renal cysts." no acute findings.  02 dependent COPD: Stable, no wheezing, though lung sounds very diminished Quit smoking in 2010   H/o gout; stable Uric acid elevated at 9.8 Plan to d/c on allopurinol, will discuss with patient.  Malnutrition: likely from chronic alcohol. No malignancy by Ct scan nutrition consult once able to eat consistantly Body mass index is 18.79 kg/m.  alcohol use, stopped a month ago due to n/v Mild elevated tibili, lipase, ct hepatobiliary/pancrease no acute finding, he has no pain. hepc tested negative in 08/2016.  Code Status: full  Family Communication: patient   Disposition Plan: home once medically stable  May need home health, very deconditioned  Consultants:  [facility]  Procedures:  DG esophagus on 1/14  Antibiotics:  Rocephin/zithro from admission to 1/14  unasyn from 1/14   Objective: BP 140/84 (BP Location: Left Arm)   Pulse 85   Temp 99.2 F (37.3 C) (Oral)   Resp 16   Ht 5\' 7"  (1.702 m)   Wt 54.4 kg (120 lb)   SpO2 (!) 89%   BMI 18.79 kg/m   Intake/Output Summary (Last 24 hours) at 07/11/2017 1312 Last data filed at 07/11/2017 1000 Gross per 24 hour  Intake 2448.33 ml  Output 1820 ml  Net 628.33 ml   Filed Weights   07/07/17 1543  Weight: 54.4 kg (120 lb)    Exam: Patient is examined daily including today on 07/11/2017, exams remain the same as of yesterday except that has changed    General:  Frail, chronically ill appearing, malnourished , anxious  Cardiovascular: RRR  Respiratory: very diminished overall, no wheezing, no rhonchi, no rales  Abdomen: Soft/ND/NT, positive BS  Musculoskeletal: No Edema  Neuro: alert,  oriented   Data Reviewed: Basic Metabolic Panel: Recent Labs  Lab 07/07/17 1550 07/09/17 0351 07/10/17 0349 07/11/17 0406  NA 137 141 138 134*  K 4.4 3.7 3.8 3.2*  CL 92* 101 99* 93*  CO2 29 27 31  32  GLUCOSE 104* 59* 99 107*   BUN 47* 26* 12 6  CREATININE 2.08* 1.19 1.01 0.91  CALCIUM 9.8 8.8* 8.7* 8.8*  MG  --   --  1.2* 1.6*   Liver Function Tests: Recent Labs  Lab 07/07/17 1550 07/10/17 0349  AST 26 24  ALT 22 16*  ALKPHOS 96 62  BILITOT 1.4* 1.0  PROT 7.1 5.2*  ALBUMIN 3.9 2.9*   Recent Labs  Lab 07/07/17 1550 07/09/17 0347 07/11/17 0406  LIPASE 52* 53* 49   No results for input(s): AMMONIA in the last 168 hours. CBC: Recent Labs  Lab 07/07/17 1550 07/09/17 0351 07/10/17 0349  WBC 17.3* 8.7 8.5  NEUTROABS  --  6.9 6.5  HGB 17.6* 14.2 13.8  HCT 51.0 42.0 41.9  MCV 88.1 88.8 89.3  PLT 266 178 190   Cardiac Enzymes:   No results for input(s): CKTOTAL, CKMB, CKMBINDEX, TROPONINI in the last 168 hours. BNP (last 3 results) No results for input(s): BNP in the last 8760 hours.  ProBNP (last 3 results) No results for input(s): PROBNP in the last 8760 hours.  CBG: No results for input(s): GLUCAP in the last 168 hours.  Recent Results (from the past 240 hour(s))  Culture, blood (routine x 2) Call MD if unable to obtain prior to antibiotics being given     Status: None (Preliminary result)   Collection Time: 07/08/17 10:41 AM  Result Value Ref Range Status   Specimen Description BLOOD LEFT ANTECUBITAL  Final   Special Requests   Final    BOTTLES DRAWN AEROBIC AND ANAEROBIC Blood Culture adequate volume   Culture   Final    NO GROWTH 2 DAYS Performed at [facility], [address] [place], [place] [address]    Report Status PENDING  Incomplete  Culture, blood (routine x 2) Call MD if unable to obtain prior to antibiotics being given     Status: None (Preliminary result)   Collection Time: 07/08/17 11:20 AM  Result Value Ref Range Status   Specimen Description BLOOD RIGHT FOREARM  Final   Special Requests   Final    BOTTLES DRAWN AEROBIC AND ANAEROBIC Blood Culture adequate volume   Culture   Final    NO GROWTH 2 DAYS Performed at [facility], [address] [place], [place] [address]    Report Status PENDING  Incomplete     Studies: Dg Esophagus  Result Date: 07/11/2017 CLINICAL DATA:  Persistent dysphagia, nausea/vomiting, unable to tolerate more than a tablespoon of food at a time, decreased appetite EXAM: ESOPHOGRAM/BARIUM SWALLOW TECHNIQUE: Single contrast examination was performed using  thin barium. FLUOROSCOPY TIME:  Fluoroscopy Time:  3.1 minutes Radiation Exposure Index (if provided by the fluoroscopic device): 1.9 mGy Number of Acquired Spot Images: 5 COMPARISON:  CT chest abdomen pelvis dated 07/08/2017 FINDINGS: Moderate esophageal dysmotility with fluid/debris in the mid/distal esophagus. Marked narrowing/stricture of the distal esophagus just above the GE junction. After significant delay, trace contrast passed into the proximal stomach. When correlating with the recent CT, there is no evidence of esophageal mass. However, a fat containing hernia at the esophageal hiatus is present, possibly leading to extrinsic compression. IMPRESSION: Severe stricture of the distal  esophagus just above the GE junction. Secondary esophageal dysmotility. When correlating with recent CT, there is no evidence of esophageal mass. Possible extrinsic compression secondary to a fat containing hernia. Consider endoscopy for further evaluation. Electronically Signed   By: Charline Bills M.D.   On: 07/11/2017 10:21    Scheduled Meds: . feeding supplement (ENSURE ENLIVE)  237 mL Oral BID WC  . guaiFENesin  600 mg Oral BID  . multivitamin with minerals  1 tablet Oral Daily  . pantoprazole (PROTONIX) IV  40 mg Intravenous Q12H    Continuous Infusions: . azithromycin 500 mg (07/11/17 1036)  . cefTRIAXone (ROCEPHIN)  IV Stopped (07/11/17 1036)  . dextrose 5 % and 0.45 % NaCl with KCl 10 mEq/L 100 mL/hr at 07/11/17 0802  . magnesium sulfate 1 - 4 g bolus IVPB 4 g (07/11/17 1205)  . potassium chloride       Time spent: 35 mins I have personally reviewed and  interpreted on  07/11/2017 daily labs.  I reviewed all nursing notes, pharmacy notes, vitals, pertinent old records  I have discussed plan of care as described above with RN , patient  on 07/11/2017   Albertine Grates MD, PhD  [facility] Pager [phone]. If 7PM-7AM, please contact night-coverage at [email or web address], password [facility] 07/11/2017, 1:12 PM  LOS: 3 days

## 2017-07-11 NOTE — Progress Notes (Signed)
PT Cancellation Note  Patient Details Name: Lillie Bollig MRN: [id number] DOB: 1945/08/02   Cancelled Treatment:    Reason Eval/Treat Not Completed: Medical issues which prohibited therapy(pt having nausea, requested PT attempt later. Will follow. )   Philomena Doheny 07/11/2017, 10:44 AM [phone]

## 2017-07-11 NOTE — Care Management Important Message (Signed)
Important Message  Patient Details  Name: George Stokes MRN: [id number] Date of Birth: May 07, 1946   Medicare Important Message Given:  Yes    Kerin Salen 07/11/2017, 12:19 [place] Message  Patient Details  Name: George Stokes MRN: [id number] Date of Birth: [birth year]   Medicare Important Message Given:  Yes    Kerin Salen 07/11/2017, 12:19 PM

## 2017-07-12 DIAGNOSIS — E43 Unspecified severe protein-calorie malnutrition: Secondary | ICD-10-CM

## 2017-07-12 LAB — COMPREHENSIVE METABOLIC PANEL
ALBUMIN: 3.7 g/dL (ref 3.5–5.0)
ALT: 21 U/L (ref 17–63)
AST: 32 U/L (ref 15–41)
Alkaline Phosphatase: 87 U/L (ref 38–126)
Anion gap: 12 (ref 5–15)
BUN: 6 mg/dL (ref 6–20)
CHLORIDE: 91 mmol/L — AB (ref 101–111)
CO2: 32 mmol/L (ref 22–32)
Calcium: 8.9 mg/dL (ref 8.9–10.3)
Creatinine, Ser: 1.06 mg/dL (ref 0.61–1.24)
GFR calc Af Amer: >60 mL/min (ref >60)
GLUCOSE: 94 mg/dL (ref 65–99)
POTASSIUM: 3.5 mmol/L (ref 3.5–5.1)
SODIUM: 135 mmol/L (ref 135–145)
Total Bilirubin: 0.6 mg/dL (ref 0.3–1.2)
Total Protein: 6.6 g/dL (ref 6.5–8.1)

## 2017-07-12 LAB — LIPASE, BLOOD: Lipase: 39 U/L (ref 11–51)

## 2017-07-12 LAB — MAGNESIUM: Magnesium: 2.2 mg/dL (ref 1.7–2.4)

## 2017-07-12 LAB — HIV ANTIBODY (ROUTINE TESTING W REFLEX): HIV Screen 4th Generation wRfx: NONREACTIVE

## 2017-07-12 MED ORDER — MOMETASONE FURO-FORMOTEROL FUM 200-5 MCG/ACT IN AERO
2.0000 | INHALATION_SPRAY | Freq: Two times a day (BID) | RESPIRATORY_TRACT | Status: DC
  Administered 2017-07-12 – 2017-07-19 (×11): 2 via RESPIRATORY_TRACT
  Filled 2017-07-12 (×3): qty 8.8

## 2017-07-12 MED ORDER — KCL IN DEXTROSE-NACL 10-5-0.45 MEQ/L-%-% IV SOLN
INTRAVENOUS | Status: AC
  Administered 2017-07-12: 22:00:00 via INTRAVENOUS
  Filled 2017-07-12 (×2): qty 1000

## 2017-07-12 MED ORDER — IPRATROPIUM-ALBUTEROL 0.5-2.5 (3) MG/3ML IN SOLN
3.0000 mL | Freq: Three times a day (TID) | RESPIRATORY_TRACT | Status: DC
  Administered 2017-07-12 – 2017-07-15 (×7): 3 mL via RESPIRATORY_TRACT
  Filled 2017-07-12 (×8): qty 3

## 2017-07-12 MED ORDER — IPRATROPIUM-ALBUTEROL 0.5-2.5 (3) MG/3ML IN SOLN
3.0000 mL | Freq: Four times a day (QID) | RESPIRATORY_TRACT | Status: DC
  Administered 2017-07-12: 3 mL via RESPIRATORY_TRACT
  Filled 2017-07-12: qty 3

## 2017-07-12 NOTE — Progress Notes (Signed)
PROGRESS NOTE  George Stokes ZOX:[id number] DOB: 11-28-1945 DOA: 07/07/2017 PCP: Patient, No Pcp Per  HPI/Recap of past 24 hours:   Poor historian Reports continue to feel nauseas with regurg when he takes pills or drink fluids, he refused pills by mouth,  He refused neb treatment, he wants his symbicort He denies pain, no fever   Assessment/Plan: Principal Problem:   Intractable nausea and vomiting Active Problems:   Essential hypertension   COPD with hypoxia (HCC)   Chronic respiratory failure with hypoxia (HCC)   CAP (community acquired pneumonia)   Lobar pneumonia (HCC)   Hypomagnesemia   Hypokalemia  Long term alcoholic ( drink votka daily). He stopped drinking a month ago due to hiccups and intractable n/v, he has weight loss -Denies abdominal pain, no fever  -ct ab/pel no bowel obstruction, no acute findings. -dg esophagus on 1/14 "Severe stricture of the distal esophagus just above the GE junction. Secondary esophageal dysmotility. When correlating with recent CT, there is no evidence of esophageal mass. Possible extrinsic compression secondary to a fat containing hernia.  Consider endoscopy for further evaluation" -Continue Ppi/ivf/antiemetics, [facility] consulted, EGD planned on 1/16  Pneumonia,  likely aspiration pneumonia from intractable n/v -Urine strep pneumo antigen negative -Ct chest "Patchy areas of nodular airspace density primarily in the right middle lobe and right upper lobe most consistent with pneumonia. Clinical correlation and follow-up to resolution after treatment recommended." "Mucous plugging in the proximal left mainstem bronchus" -on Abx, start mucinex/nebs, he refused mucinex. He was on rocephin/zitrho, change abx to unasyn to cover aspiration pneumonia - Leukocytosis likely fron penumonia and dehydration, normalized -lung sounds very diminished, he does not cough much, he now agreed to neb treatment.  AKI -likely from dehydration -ua no  infection -Continue hydration, cr 2.08 on presentation, has normalized.  Hypokalemia/Hypomagnesemia: replace k/mag.  Bilateral renal hypodense lesions on CT scan ua no blood Renal US "Left renal cysts." no acute findings.  02 dependent COPD: Stable, no wheezing, though lung sounds very diminished Quit smoking in 2010   H/o gout; stable Uric acid elevated at 9.8 Plan to d/c on allopurinol once able to take oral.  Malnutrition: likely from chronic alcohol. No malignancy by Ct scan nutrition consult once able to eat consistantly Body mass index is 18.79 kg/m.  alcohol use, stopped a month ago due to n/v Mild elevated tibili, lipase, ct hepatobiliary/pancrease no acute finding, he has no pain. hepc tested negative in 08/2016.  Code Status: full  Family Communication: patient   Disposition Plan: home once medically stable    Consultants:  [facility]  Procedures:  DG esophagus on 1/14  egd planned on 1/16  Antibiotics:  Rocephin/zithro from admission to 1/14  unasyn from 1/14   Objective: BP 124/84 (BP Location: Left Arm)   Pulse 90   Temp 97.8 F (36.6 C) (Oral)   Resp 18   Ht 5\' 7"  (1.702 m)   Wt 54.4 kg (120 lb)   SpO2 95%   BMI 18.79 kg/m   Intake/Output Summary (Last 24 hours) at 07/12/2017 0848 Last data filed at 07/12/2017 0800 Gross per 24 hour  Intake 2636.66 ml  Output 1900 ml  Net 736.66 ml   Filed Weights   07/07/17 1543  Weight: 54.4 kg (120 lb)    Exam: Patient is examined daily including today on 07/12/2017, exams remain the same as of yesterday except that has changed    General:  Frail, chronically ill appearing, malnourished , anxious  Cardiovascular: RRR  Respiratory: very diminished overall, no wheezing, no rhonchi, no rales  Abdomen: Soft/ND/NT, positive BS  Musculoskeletal: No Edema  Neuro: alert, oriented   Data Reviewed: Basic Metabolic Panel: Recent Labs  Lab 07/07/17 1550 07/09/17 0351 07/10/17 0349  07/11/17 0406 07/12/17 0342  NA 137 141 138 134* 135  K 4.4 3.7 3.8 3.2* 3.5  CL 92* 101 99* 93* 91*  CO2 29 27 31  32 32  GLUCOSE 104* 59* 99 107* 94  BUN 47* 26* 12 6 6   CREATININE 2.08* 1.19 1.01 0.91 1.06  CALCIUM 9.8 8.8* 8.7* 8.8* 8.9  MG  --   --  1.2* 1.6* 2.2   Liver Function Tests: Recent Labs  Lab 07/07/17 1550 07/10/17 0349 07/12/17 0342  AST 26 24 32  ALT 22 16* 21  ALKPHOS 96 62 87  BILITOT 1.4* 1.0 0.6  PROT 7.1 5.2* 6.6  ALBUMIN 3.9 2.9* 3.7   Recent Labs  Lab 07/07/17 1550 07/09/17 0347 07/11/17 0406 07/12/17 0342  LIPASE 52* 53* 49 39   No results for input(s): AMMONIA in the last 168 hours. CBC: Recent Labs  Lab 07/07/17 1550 07/09/17 0351 07/10/17 0349  WBC 17.3* 8.7 8.5  NEUTROABS  --  6.9 6.5  HGB 17.6* 14.2 13.8  HCT 51.0 42.0 41.9  MCV 88.1 88.8 89.3  PLT 266 178 190   Cardiac Enzymes:   No results for input(s): CKTOTAL, CKMB, CKMBINDEX, TROPONINI in the last 168 hours. BNP (last 3 results) No results for input(s): BNP in the last 8760 hours.  ProBNP (last 3 results) No results for input(s): PROBNP in the last 8760 hours.  CBG: No results for input(s): GLUCAP in the last 168 hours.  Recent Results (from the past 240 hour(s))  Culture, blood (routine x 2) Call MD if unable to obtain prior to antibiotics being given     Status: None (Preliminary result)   Collection Time: 07/08/17 10:41 AM  Result Value Ref Range Status   Specimen Description BLOOD LEFT ANTECUBITAL  Final   Special Requests   Final    BOTTLES DRAWN AEROBIC AND ANAEROBIC Blood Culture adequate volume   Culture   Final    NO GROWTH 3 DAYS Performed at [facility], [address] [place], [place] [address]    Report Status PENDING  Incomplete  Culture, blood (routine x 2) Call MD if unable to obtain prior to antibiotics being given     Status: None (Preliminary result)   Collection Time: 07/08/17 11:20 AM  Result Value Ref Range Status   Specimen  Description BLOOD RIGHT FOREARM  Final   Special Requests   Final    BOTTLES DRAWN AEROBIC AND ANAEROBIC Blood Culture adequate volume   Culture   Final    NO GROWTH 3 DAYS Performed at [facility], [address] [place], [place] [address]    Report Status PENDING  Incomplete     Studies: Dg Esophagus  Result Date: 07/11/2017 CLINICAL DATA:  Persistent dysphagia, nausea/vomiting, unable to tolerate more than a tablespoon of food at a time, decreased appetite EXAM: ESOPHOGRAM/BARIUM SWALLOW TECHNIQUE: Single contrast examination was performed using  thin barium. FLUOROSCOPY TIME:  Fluoroscopy Time:  3.1 minutes Radiation Exposure Index (if provided by the fluoroscopic device): 1.9 mGy Number of Acquired Spot Images: 5 COMPARISON:  CT chest abdomen pelvis dated 07/08/2017 FINDINGS: Moderate esophageal dysmotility with fluid/debris in the mid/distal esophagus. Marked narrowing/stricture of the distal esophagus just above the GE junction. After  significant delay, trace contrast passed into the proximal stomach. When correlating with the recent CT, there is no evidence of esophageal mass. However, a fat containing hernia at the esophageal hiatus is present, possibly leading to extrinsic compression. IMPRESSION: Severe stricture of the distal esophagus just above the GE junction. Secondary esophageal dysmotility. When correlating with recent CT, there is no evidence of esophageal mass. Possible extrinsic compression secondary to a fat containing hernia. Consider endoscopy for further evaluation. Electronically Signed   By: Charline Bills M.D.   On: 07/11/2017 10:21    Scheduled Meds: . feeding supplement (ENSURE ENLIVE)  237 mL Oral BID WC  . guaiFENesin  600 mg Oral BID  . multivitamin with minerals  1 tablet Oral Daily  . pantoprazole (PROTONIX) IV  40 mg Intravenous Q12H    Continuous Infusions: . sodium chloride    . ampicillin-sulbactam (UNASYN) IV Stopped (07/12/17 0503)  .  dextrose 5 % and 0.45 % NaCl with KCl 10 mEq/L 100 mL/hr at 07/12/17 0433     Time spent: 25 mins I have personally reviewed and interpreted on  07/12/2017 daily labs.  I reviewed all nursing notes, pharmacy notes, vitals, pertinent old records  I have discussed plan of care as described above with RN , patient  on 07/12/2017   Albertine Grates MD, PhD  [facility] Pager [phone]. If 7PM-7AM, please contact night-coverage at [email or web address], password [facility] 07/12/2017, 8:48 AM  LOS: 4 days

## 2017-07-12 NOTE — Progress Notes (Signed)
[facility] Progress Note  CC:  Dysphagia, N/V, weight loss, abnormal imaging  Subjective:  Just got done with PT and did well.  On for EGD at 10 AM tomorrow.  Objective:  Vital signs in last 24 hours: Temp:  [97.6 F (36.4 C)-98 F (36.7 C)] 97.8 F (36.6 C) (01/15 0455) Pulse Rate:  [83-92] 90 (01/15 0455) Resp:  [18] 18 (01/15 0455) BP: (121-154)/(75-93) 124/84 (01/15 0455) SpO2:  [91 %-95 %] 95 % (01/15 0455) Last BM Date: 07/11/17 General:  Alert, thin and frail, in NAD Heart:  Regular rate and rhythm; no murmurs Pulm:  Decreased BS B/L but no W/R/R noted. Abdomen:  Soft, non-distended.  BS present.  Non-tender.  Extremities:  Without edema. Neurologic:  Alert and oriented x 4;  grossly normal neurologically. Psych:  Alert and cooperative. Normal mood and affect.  Intake/Output from previous day: 01/14 0701 - 01/15 0700 In: 2636.7 [P.O.:240; I.V.:2246.7; IV Piggyback:150] Out: 1500 [Urine:1500] Intake/Output this shift: Total I/O In: -  Out: 400 [Urine:300; Emesis/NG output:100]  Lab Results: Recent Labs    07/10/17 0349  WBC 8.5  HGB 13.8  HCT 41.9  PLT 190   BMET Recent Labs    07/10/17 0349 07/11/17 0406 07/12/17 0342  NA 138 134* 135  K 3.8 3.2* 3.5  CL 99* 93* 91*  CO2 31 32 32  GLUCOSE 99 107* 94  BUN 12 6 6   CREATININE 1.01 0.91 1.06  CALCIUM 8.7* 8.8* 8.9   LFT Recent Labs    07/12/17 0342  PROT 6.6  ALBUMIN 3.7  AST 32  ALT 21  ALKPHOS 87  BILITOT 0.6   PT/INR Recent Labs    07/11/17 0406  LABPROT 13.7  INR 1.06    Dg Esophagus  Result Date: 07/11/2017 CLINICAL DATA:  Persistent dysphagia, nausea/vomiting, unable to tolerate more than a tablespoon of food at a time, decreased appetite EXAM: ESOPHOGRAM/BARIUM SWALLOW TECHNIQUE: Single contrast examination was performed using  thin barium. FLUOROSCOPY TIME:  Fluoroscopy Time:  3.1 minutes Radiation Exposure Index (if provided by the fluoroscopic device):  1.9 mGy Number of Acquired Spot Images: 5 COMPARISON:  CT chest abdomen pelvis dated 07/08/2017 FINDINGS: Moderate esophageal dysmotility with fluid/debris in the mid/distal esophagus. Marked narrowing/stricture of the distal esophagus just above the GE junction. After significant delay, trace contrast passed into the proximal stomach. When correlating with the recent CT, there is no evidence of esophageal mass. However, a fat containing hernia at the esophageal hiatus is present, possibly leading to extrinsic compression. IMPRESSION: Severe stricture of the distal esophagus just above the GE junction. Secondary esophageal dysmotility. When correlating with recent CT, there is no evidence of esophageal mass. Possible extrinsic compression secondary to a fat containing hernia. Consider endoscopy for further evaluation. Electronically Signed   By: Julian Hy M.D.   On: 07/11/2017 10:21   Assessment / Plan: Impression: 1. Abnormal Imaging esophagus: "Severe stricture on DG esophagus"; Consider stricture vs ring vs web vs mass 2. Intractable nausea and vomiting: likely due to above, sounds more like regurgitation. 3. Etoh abuse: reports daily use, at least a 6-pack per day or the equivalent of; stopped 1 month ago due to hiccups 4. Pneumonia: thought due to aspiration, leukocytosis resolved. This is day 4 of abx. 5. O2 dependent COPD: on 4 L at home  Plan: 1. Will plan for EGD on Wednesday, 1/16. 2. Patient may be on clears as tolerated today and then NPO after midnight. 3. Continue  Protonix 40mg  IV BID    LOS: 4 days   Laban Emperor. Zehr  07/12/2017, 9:34 AM  Pager number [phone]   Attending physician's note   I have taken an interval history, reviewed the chart and examined the patient. I agree with the Advanced Practitioner's note, impression and recommendations.  EGD tomorrow . NPO after midnight The risks and benefits as well as alternatives of endoscopic procedure(s) have been  discussed and reviewed. All questions answered. The patient agrees to proceed.  Damaris Hippo, MD [phone] Mon-Fri 8a-5p [phone] after 5p, weekends, holidays

## 2017-07-12 NOTE — Anesthesia Preprocedure Evaluation (Addendum)
Anesthesia Evaluation  Patient identified by MRN, date of birth, ID band Patient awake    Reviewed: Allergy & Precautions, NPO status , Patient's Chart, lab work & pertinent test results  Airway Mallampati: II  TM Distance: >3 FB Neck ROM: Full    Dental no notable dental hx.    Pulmonary neg pulmonary ROS, COPD,  COPD inhaler and oxygen dependent, former smoker,    Pulmonary exam normal breath sounds clear to auscultation       Cardiovascular hypertension, Pt. on medications negative cardio ROS Normal cardiovascular exam Rhythm:Regular Rate:Normal     Neuro/Psych negative neurological ROS  negative psych ROS   GI/Hepatic negative GI ROS, Neg liver ROS,   Endo/Other  negative endocrine ROS  Renal/GU Renal diseasenegative Renal ROS  negative genitourinary   Musculoskeletal negative musculoskeletal ROS (+) Arthritis , Osteoarthritis,    Abdominal   Peds negative pediatric ROS (+)  Hematology negative hematology ROS (+)   Anesthesia Other Findings   Reproductive/Obstetrics negative OB ROS                            Anesthesia Physical Anesthesia Plan  ASA: III  Anesthesia Plan: MAC   Post-op Pain Management:    Induction: Intravenous  PONV Risk Score and Plan: 1 and Treatment may vary due to age or medical condition  Airway Management Planned: Mask, Natural Airway and Nasal Cannula  Additional Equipment:   Intra-op Plan:   Post-operative Plan:   Informed Consent: I have reviewed the patients History and Physical, chart, labs and discussed the procedure including the risks, benefits and alternatives for the proposed anesthesia with the patient or authorized representative who has indicated his/her understanding and acceptance.     Plan Discussed with: CRNA and Anesthesiologist  Anesthesia Plan Comments:         Anesthesia Quick Evaluation

## 2017-07-12 NOTE — Evaluation (Signed)
Physical Therapy Evaluation Patient Details Name: George Stokes MRN: [id number] DOB: 07-30-1945 Today's Date: 07/12/2017   History of Present Illness  72 y.o. male with medical history significant of COPD, CRF on 4 liters he thinks at home, htn comes in with over a week of nausea and vomiting. Dx of PNA  Clinical Impression  Pt admitted with above diagnosis. Pt currently with functional limitations due to the deficits listed below (see PT Problem List). Pt ambulated a total of 400' with frequent standing rest breaks 2* SOB (pt reports this is baseline). SaO2 94% on room air at rest, 97% on 4L O2 with walking. Rollator recommended for home for energy conservation.  Pt will benefit from skilled PT to increase their independence and safety with mobility to allow discharge to the venue listed below.       Follow Up Recommendations No PT follow up    Equipment Recommendations  Other (comment)(4 wheeled RW with seat)    Recommendations for Other Services       Precautions / Restrictions        Mobility  Bed Mobility Overal bed mobility: Modified Independent             General bed mobility comments: HOB up, used rail  Transfers Overall transfer level: Modified independent Equipment used: Rolling walker (2 wheeled)                Ambulation/Gait Ambulation/Gait assistance: Modified independent (Device/Increase time) Ambulation Distance (Feet): 400 Feet Assistive device: Rolling walker (2 wheeled);None Gait Pattern/deviations: Trunk flexed   Gait velocity interpretation: at or above normal speed for age/gender General Gait Details: SaO2 97% on 4L O2, no LOB, frequent standing rest breaks every ~40 steps (without RW) 2* fatigue (pt reports this is baseline), pt able to walk farther (~80 steps) with RW before becoming SOB than without RW, so recommended use of RW for energy concervation  Stairs            Wheelchair Mobility    Modified Rankin (Stroke Patients  Only)       Balance Overall balance assessment: Independent                                           Pertinent Vitals/Pain Pain Assessment: No/denies pain    Home Living Family/patient expects to be discharged to:: Private residence Living Arrangements: Alone Available Help at Discharge: Friend(s);Available PRN/intermittently Type of Home: Apartment Home Access: Stairs to enter   Entrance Stairs-Number of Steps: 8 Home Layout: One level Home Equipment: Cane - single point;Other (comment)(home O2)      Prior Function Level of Independence: Independent with assistive device(s)         Comments: sponge bathes, walks with SPC, can get his own groceries     Hand Dominance        Extremity/Trunk Assessment   Upper Extremity Assessment Upper Extremity Assessment: Overall WFL for tasks assessed    Lower Extremity Assessment Lower Extremity Assessment: Overall WFL for tasks assessed    Cervical / Trunk Assessment Cervical / Trunk Assessment: Kyphotic  Communication   Communication: No difficulties  Cognition Arousal/Alertness: Awake/alert Behavior During Therapy: WFL for tasks assessed/performed Overall Cognitive Status: Within Functional Limits for tasks assessed  General Comments General comments (skin integrity, edema, etc.): denies falls    Exercises     Assessment/Plan    PT Assessment Patient needs continued PT services  PT Problem List Cardiopulmonary status limiting activity;Decreased knowledge of use of DME       PT Treatment Interventions DME instruction;Gait training;Therapeutic activities;Patient/family education    PT Goals (Current goals can be found in the Care Plan section)  Acute Rehab PT Goals Patient Stated Goal: to walk farther PT Goal Formulation: With patient Time For Goal Achievement: 07/26/17 Potential to Achieve Goals: Good    Frequency Min 3X/week    Barriers to discharge        Co-evaluation               AM-PAC PT "6 Clicks" Daily Activity  Outcome Measure Difficulty turning over in bed (including adjusting bedclothes, sheets and blankets)?: A Little Difficulty moving from lying on back to sitting on the side of the bed? : A Little Difficulty sitting down on and standing up from a chair with arms (e.g., wheelchair, bedside commode, etc,.)?: None Help needed moving to and from a bed to chair (including a wheelchair)?: None Help needed walking in hospital room?: None Help needed climbing 3-5 steps with a railing? : A Little 6 Click Score: 21    End of Session Equipment Utilized During Treatment: Gait belt;Oxygen Activity Tolerance: Patient tolerated treatment well Patient left: in bed;with call bell/phone within reach Nurse Communication: Mobility status PT Visit Diagnosis: Difficulty in walking, not elsewhere classified (R26.2)    Time: 8119-1478 PT Time Calculation (min) (ACUTE ONLY): 28 min   Charges:   PT Evaluation $PT Eval Low Complexity: 1 Low PT Treatments $Gait Training: 8-22 mins   PT G Codes:         Tamala Ser 07/12/2017, 10:48 AM [phone]

## 2017-07-12 NOTE — H&P (View-Only) (Signed)
[facility] Progress Note  CC:  Dysphagia, N/V, weight loss, abnormal imaging  Subjective:  Just got done with PT and did well.  On for EGD at 10 AM tomorrow.  Objective:  Vital signs in last 24 hours: Temp:  [97.6 F (36.4 C)-98 F (36.7 C)] 97.8 F (36.6 C) (01/15 0455) Pulse Rate:  [83-92] 90 (01/15 0455) Resp:  [18] 18 (01/15 0455) BP: (121-154)/(75-93) 124/84 (01/15 0455) SpO2:  [91 %-95 %] 95 % (01/15 0455) Last BM Date: 07/11/17 General:  Alert, thin and frail, in NAD Heart:  Regular rate and rhythm; no murmurs Pulm:  Decreased BS B/L but no W/R/R noted. Abdomen:  Soft, non-distended.  BS present.  Non-tender.  Extremities:  Without edema. Neurologic:  Alert and oriented x 4;  grossly normal neurologically. Psych:  Alert and cooperative. Normal mood and affect.  Intake/Output from previous day: 01/14 0701 - 01/15 0700 In: 2636.7 [P.O.:240; I.V.:2246.7; IV Piggyback:150] Out: 1500 [Urine:1500] Intake/Output this shift: Total I/O In: -  Out: 400 [Urine:300; Emesis/NG output:100]  Lab Results: Recent Labs    07/10/17 0349  WBC 8.5  HGB 13.8  HCT 41.9  PLT 190   BMET Recent Labs    07/10/17 0349 07/11/17 0406 07/12/17 0342  NA 138 134* 135  K 3.8 3.2* 3.5  CL 99* 93* 91*  CO2 31 32 32  GLUCOSE 99 107* 94  BUN 12 6 6   CREATININE 1.01 0.91 1.06  CALCIUM 8.7* 8.8* 8.9   LFT Recent Labs    07/12/17 0342  PROT 6.6  ALBUMIN 3.7  AST 32  ALT 21  ALKPHOS 87  BILITOT 0.6   PT/INR Recent Labs    07/11/17 0406  LABPROT 13.7  INR 1.06    Dg Esophagus  Result Date: 07/11/2017 CLINICAL DATA:  Persistent dysphagia, nausea/vomiting, unable to tolerate more than a tablespoon of food at a time, decreased appetite EXAM: ESOPHOGRAM/BARIUM SWALLOW TECHNIQUE: Single contrast examination was performed using  thin barium. FLUOROSCOPY TIME:  Fluoroscopy Time:  3.1 minutes Radiation Exposure Index (if provided by the fluoroscopic device):  1.9 mGy Number of Acquired Spot Images: 5 COMPARISON:  CT chest abdomen pelvis dated 07/08/2017 FINDINGS: Moderate esophageal dysmotility with fluid/debris in the mid/distal esophagus. Marked narrowing/stricture of the distal esophagus just above the GE junction. After significant delay, trace contrast passed into the proximal stomach. When correlating with the recent CT, there is no evidence of esophageal mass. However, a fat containing hernia at the esophageal hiatus is present, possibly leading to extrinsic compression. IMPRESSION: Severe stricture of the distal esophagus just above the GE junction. Secondary esophageal dysmotility. When correlating with recent CT, there is no evidence of esophageal mass. Possible extrinsic compression secondary to a fat containing hernia. Consider endoscopy for further evaluation. Electronically Signed   By: Julian Hy M.D.   On: 07/11/2017 10:21   Assessment / Plan: Impression: 1. Abnormal Imaging esophagus: "Severe stricture on DG esophagus"; Consider stricture vs ring vs web vs mass 2. Intractable nausea and vomiting: likely due to above, sounds more like regurgitation. 3. Etoh abuse: reports daily use, at least a 6-pack per day or the equivalent of; stopped 1 month ago due to hiccups 4. Pneumonia: thought due to aspiration, leukocytosis resolved. This is day 4 of abx. 5. O2 dependent COPD: on 4 L at home  Plan: 1. Will plan for EGD on Wednesday, 1/16. 2. Patient may be on clears as tolerated today and then NPO after midnight. 3. Continue  Protonix 40mg  IV BID    LOS: 4 days   Laban Emperor. Zehr  07/12/2017, 9:34 AM  Pager number [phone]   Attending physician's note   I have taken an interval history, reviewed the chart and examined the patient. I agree with the Advanced Practitioner's note, impression and recommendations.  EGD tomorrow . NPO after midnight The risks and benefits as well as alternatives of endoscopic procedure(s) have been  discussed and reviewed. All questions answered. The patient agrees to proceed.  Damaris Hippo, MD [phone] Mon-Fri 8a-5p [phone] after 5p, weekends, holidays

## 2017-07-13 ENCOUNTER — Inpatient Hospital Stay (HOSPITAL_COMMUNITY): Payer: Medicare Other | Admitting: Anesthesiology

## 2017-07-13 ENCOUNTER — Encounter (HOSPITAL_COMMUNITY): Payer: Self-pay

## 2017-07-13 ENCOUNTER — Encounter (HOSPITAL_COMMUNITY): Admission: EM | Disposition: A | Discharge status: Skilled Nursing Facility | Payer: Self-pay | Source: Home / Self Care

## 2017-07-13 DIAGNOSIS — K222 Esophageal obstruction: Secondary | ICD-10-CM

## 2017-07-13 DIAGNOSIS — E440 Moderate protein-calorie malnutrition: Secondary | ICD-10-CM

## 2017-07-13 DIAGNOSIS — R1310 Dysphagia, unspecified: Secondary | ICD-10-CM

## 2017-07-13 DIAGNOSIS — K229 Disease of esophagus, unspecified: Secondary | ICD-10-CM

## 2017-07-13 DIAGNOSIS — C159 Malignant neoplasm of esophagus, unspecified: Secondary | ICD-10-CM

## 2017-07-13 HISTORY — PX: ESOPHAGOGASTRODUODENOSCOPY (EGD) WITH PROPOFOL: SHX5813

## 2017-07-13 LAB — CULTURE, BLOOD (ROUTINE X 2)
CULTURE: NO GROWTH
CULTURE: NO GROWTH
Special Requests: ADEQUATE
Special Requests: ADEQUATE

## 2017-07-13 LAB — BASIC METABOLIC PANEL
Anion gap: 10 (ref 5–15)
BUN: 5 mg/dL — AB (ref 6–20)
CALCIUM: 8.8 mg/dL — AB (ref 8.9–10.3)
CHLORIDE: 96 mmol/L — AB (ref 101–111)
CO2: 31 mmol/L (ref 22–32)
CREATININE: 0.99 mg/dL (ref 0.61–1.24)
GFR calc non Af Amer: >60 mL/min (ref >60)
Glucose, Bld: 74 mg/dL (ref 65–99)
Potassium: 3.5 mmol/L (ref 3.5–5.1)
SODIUM: 137 mmol/L (ref 135–145)

## 2017-07-13 LAB — PREALBUMIN: PREALBUMIN: 11.6 mg/dL — AB (ref 18–38)

## 2017-07-13 SURGERY — ESOPHAGOGASTRODUODENOSCOPY (EGD) WITH PROPOFOL
Anesthesia: Monitor Anesthesia Care

## 2017-07-13 MED ORDER — PROPOFOL 10 MG/ML IV BOLUS
INTRAVENOUS | Status: AC
  Filled 2017-07-13: qty 40

## 2017-07-13 MED ORDER — PHENYLEPHRINE HCL 10 MG/ML IJ SOLN
INTRAMUSCULAR | Status: DC | PRN
  Administered 2017-07-13: 80 ug via INTRAVENOUS
  Administered 2017-07-13: 120 ug via INTRAVENOUS
  Administered 2017-07-13: 80 ug via INTRAVENOUS
  Administered 2017-07-13: 120 ug via INTRAVENOUS
  Administered 2017-07-13: 80 ug via INTRAVENOUS
  Administered 2017-07-13: 120 ug via INTRAVENOUS

## 2017-07-13 MED ORDER — LACTATED RINGERS IV SOLN
INTRAVENOUS | Status: DC | PRN
  Administered 2017-07-13: 10:00:00 via INTRAVENOUS

## 2017-07-13 MED ORDER — SODIUM CHLORIDE 0.9 % IV SOLN
INTRAVENOUS | Status: AC
  Administered 2017-07-13: 12:00:00 via INTRAVENOUS

## 2017-07-13 MED ORDER — PROPOFOL 500 MG/50ML IV EMUL
INTRAVENOUS | Status: DC | PRN
  Administered 2017-07-13: 125 ug/kg/min via INTRAVENOUS

## 2017-07-13 SURGICAL SUPPLY — 15 items

## 2017-07-13 NOTE — Transfer of Care (Signed)
Immediate Anesthesia Transfer of Care Note  Patient: George Stokes  Procedure(s) Performed: ESOPHAGOGASTRODUODENOSCOPY (EGD) WITH PROPOFOL (N/A )  Patient Location: PACU  Anesthesia Type:MAC  Level of Consciousness: awake, alert  and oriented  Airway & Oxygen Therapy: Patient Spontanous Breathing and Patient connected to nasal cannula oxygen  Post-op Assessment: Report given to RN and Post -op Vital signs reviewed and stable  Post vital signs: Reviewed and stable  Last Vitals:  Vitals:   07/13/17 0524 07/13/17 0922  BP: 116/76 (!) 147/102  Pulse: 76 83  Resp: 16 18  Temp: 37.1 C 37 C  SpO2: 93% 94%    Last Pain:  Vitals:   07/13/17 0922  TempSrc: Oral  PainSc:          Complications: No apparent anesthesia complications

## 2017-07-13 NOTE — Progress Notes (Signed)
[facility] PROGRESS NOTE    Progress Note  George Stokes  DGL:[id number] DOB: 1945/09/22 DOA: 07/07/2017 PCP: Patient, No Pcp Per     Brief Narrative:   George Stokes is an 72 y.o. male past medical history significant for COPD on home oxygen comes for 1 week of nausea vomiting he denies any abdominal pain or hematemesis no diarrhea.  Assessment/Plan:   Intractable nausea and vomiting due to esophageal stenosis: He stopped drinking about a month ago due to hiccups intractable nausea vomiting and weight loss CT scan of the abdomen pelvis showed no acute findings. He was started on PPI IV antiemetics GI was consulted recommended an EGD performed on 07/13/2016 that showed malignant appearing esophageal stenosis biopsies were done awaiting results. Awaiting further recommendation from GI.  Likely aspiration pneumonia: From intractable nausea and vomiting On IV Unasyn he has defervesced leukocytosis is improved. Lungs are clear.  Acute kidney injury: Likely prerenal in etiology resolved with IV fluid hydration.  Hypokalemia/hypomagnesemia: Continue to replace orally and monitor.  Bilateral renal hypodense lesions seen on CT: [place] confirmed renal cyst  COPD oxygen dependent: No wheezing stable  History of gout: Start oral allopurinol was able to take orals.  Alcohol use: No signs of withdrawal.  Essential hypertension  Malnutrition of moderate degree Likely due to alcohol abuse in the setting of esophageal stenosis.  Ensure 3 times daily.   DVT prophylaxis: lovenox Family Communication:none Disposition Plan/Barrier to D/C: unable to determine Code Status:     Code Status Orders  (From admission, onward)        Start     Ordered   07/08/17 0955  Full code  Continuous     07/08/17 0954    Code Status History    Date Active Date Inactive Code Status Order ID Comments User Context   This patient has a current code status but no historical code status.         IV Access:    Peripheral IV   Procedures and diagnostic studies:   No results found.   Medical Consultants:    None.  Anti-Infectives:   None  Subjective:    George Stokes he relates he started taking cough he does not know if he is tolerating his diet.  Has no pain.  Objective:    Vitals:   07/13/17 1105 07/13/17 1110 07/13/17 1115 07/13/17 1120  BP: 99/72 112/71  118/76  Pulse: 74 76 72 73  Resp: 14 15 12 17   Temp:      TempSrc:      SpO2: 99% 98% 99% 96%  Weight:      Height:        Intake/Output Summary (Last 24 hours) at 07/13/2017 1134 Last data filed at 07/13/2017 1048 Gross per 24 hour  Intake 880 ml  Output 850 ml  Net 30 ml   Filed Weights   07/07/17 1543 07/13/17 0922  Weight: 54.4 kg (120 lb) 54.4 kg (120 lb)    Exam: General exam: In no acute distress, cachectic appearing. Respiratory system: Good air movement and clear to auscultation. Cardiovascular system: S1 & S2 heard, RRR.  Gastrointestinal system: Abdomen is nondistended, soft and nontender.  Central nervous system: Alert and oriented. No focal neurological deficits. Extremities: No pedal edema. Skin: No rashes, lesions or ulcers Psychiatry: Judgement and insight appear normal. Mood & affect appropriate.    Data Reviewed:    Labs: Basic Metabolic Panel: Recent Labs  Lab 07/09/17 0351 07/10/17 0349 07/11/17 0406 07/12/17  0342 07/13/17 0323  NA 141 138 134* 135 137  K 3.7 3.8 3.2* 3.5 3.5  CL 101 99* 93* 91* 96*  CO2 27 31 32 32 31  GLUCOSE 59* 99 107* 94 74  BUN 26* 12 6 6  5*  CREATININE 1.19 1.01 0.91 1.06 0.99  CALCIUM 8.8* 8.7* 8.8* 8.9 8.8*  MG  --  1.2* 1.6* 2.2  --    GFR Estimated Creatinine Clearance: 54.2 mL/min (by C-G formula based on SCr of 0.99 mg/dL). Liver Function Tests: Recent Labs  Lab 07/07/17 1550 07/10/17 0349 07/12/17 0342  AST 26 24 32  ALT 22 16* 21  ALKPHOS 96 62 87  BILITOT 1.4* 1.0 0.6  PROT 7.1 5.2* 6.6  ALBUMIN 3.9  2.9* 3.7   Recent Labs  Lab 07/07/17 1550 07/09/17 0347 07/11/17 0406 07/12/17 0342  LIPASE 52* 53* 49 39   No results for input(s): AMMONIA in the last 168 hours. Coagulation profile Recent Labs  Lab 07/11/17 0406  INR 1.06    CBC: Recent Labs  Lab 07/07/17 1550 07/09/17 0351 07/10/17 0349  WBC 17.3* 8.7 8.5  NEUTROABS  --  6.9 6.5  HGB 17.6* 14.2 13.8  HCT 51.0 42.0 41.9  MCV 88.1 88.8 89.3  PLT 266 178 190   Cardiac Enzymes: No results for input(s): CKTOTAL, CKMB, CKMBINDEX, TROPONINI in the last 168 hours. BNP (last 3 results) No results for input(s): PROBNP in the last 8760 hours. CBG: No results for input(s): GLUCAP in the last 168 hours. D-Dimer: No results for input(s): DDIMER in the last 72 hours. Hgb A1c: No results for input(s): HGBA1C in the last 72 hours. Lipid Profile: No results for input(s): CHOL, HDL, LDLCALC, TRIG, CHOLHDL, LDLDIRECT in the last 72 hours. Thyroid function studies: No results for input(s): TSH, T4TOTAL, T3FREE, THYROIDAB in the last 72 hours.  Invalid input(s): FREET3 Anemia work up: Recent Labs    07/11/17 0406  VITAMINB12 430  FOLATE 6.9   Sepsis Labs: Recent Labs  Lab 07/07/17 1550 07/09/17 0351 07/10/17 0349  WBC 17.3* 8.7 8.5   Microbiology Recent Results (from the past 240 hour(s))  Culture, blood (routine x 2) Call MD if unable to obtain prior to antibiotics being given     Status: None (Preliminary result)   Collection Time: 07/08/17 10:41 AM  Result Value Ref Range Status   Specimen Description BLOOD LEFT ANTECUBITAL  Final   Special Requests   Final    BOTTLES DRAWN AEROBIC AND ANAEROBIC Blood Culture adequate volume   Culture   Final    NO GROWTH 4 DAYS Performed at [facility], [address], [place], [place] [address]    Report Status PENDING  Incomplete  Culture, blood (routine x 2) Call MD if unable to obtain prior to antibiotics being given     Status: None (Preliminary result)    Collection Time: 07/08/17 11:20 AM  Result Value Ref Range Status   Specimen Description BLOOD RIGHT FOREARM  Final   Special Requests   Final    BOTTLES DRAWN AEROBIC AND ANAEROBIC Blood Culture adequate volume   Culture   Final    NO GROWTH 4 DAYS Performed at [facility], [address] [place], [place] [address]    Report Status PENDING  Incomplete     Medications:   . feeding supplement (ENSURE ENLIVE)  237 mL Oral BID WC  . guaiFENesin  600 mg Oral BID  . ipratropium-albuterol  3 mL  Nebulization TID  . mometasone-formoterol  2 puff Inhalation BID  . multivitamin with minerals  1 tablet Oral Daily  . pantoprazole (PROTONIX) IV  40 mg Intravenous Q12H   Continuous Infusions: . ampicillin-sulbactam (UNASYN) IV 1.5 g (07/13/17 1132)     LOS: 5 days   Marinda Elk  [facility] Pager [phone]  *Please refer to [email or web address], password [email or web address] to get updated schedule on who will round on this patient, as hospitalists switch teams weekly. If 7PM-7AM, please contact night-coverage at [email or web address], password [email or web address] for any overnight needs.  07/13/2017, 11:34 AM

## 2017-07-13 NOTE — Anesthesia Postprocedure Evaluation (Signed)
Anesthesia Post Note  Patient: George Stokes  Procedure(s) Performed: ESOPHAGOGASTRODUODENOSCOPY (EGD) WITH PROPOFOL (N/A )     Patient location during evaluation: PACU Anesthesia Type: MAC Level of consciousness: awake and alert Pain management: pain level controlled Vital Signs Assessment: post-procedure vital signs reviewed and stable Respiratory status: spontaneous breathing, nonlabored ventilation, respiratory function stable and patient connected to nasal cannula oxygen Cardiovascular status: stable and blood pressure returned to baseline Postop Assessment: no apparent nausea or vomiting Anesthetic complications: no    Last Vitals:  Vitals:   07/13/17 1050 07/13/17 1055  BP: (!) 82/52 104/68  Pulse: 75 75  Resp: 17 15  Temp:    SpO2: 97% 99%    Last Pain:  Vitals:   07/13/17 1045  TempSrc: Oral  PainSc:                  Bartosz Luginbill

## 2017-07-13 NOTE — Consult Note (Signed)
[facility] Surgery Consult Note  George Stokes 14-Jan-1946  [id number].    Requesting MD: Nandigam Chief Complaint/Reason for Consult: mass at gastroesophageal junction  HPI:  George Stokes is a 72yo male PMH O2 dependent COPD and alcohol abuse, admitted to [facility] 1/11 with intractable n/v. States that this has been going on for at least 2 weeks and has gradually gotten worse. He has been unable to eat or drink more than a few sips of liquid without getting nauseated. Associated symptoms include loss of appetite and decrease in frequency of bowel movement. He denies any abdominal pain. He reports at least a 20lb weight loss over the last month.  Since hospitalization the patient has had a DG esophagus 1/14 that showed severe stricture of the distal esophagus just above the GE junction, secondary esophageal dysmotility. He underwent upper endoscopy earlier today by Dr. 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  General surgery has been asked to see the patient.  PMH significant for O2 dependent COPD, HTN, alcohol abuse Abdominal surgical history: none Nonsmoker Alcohol: average 8 beers/day Retired, lives home alone  ROS: Review of Systems  Constitutional: Positive for weight loss.  HENT: Positive for hearing loss.   Eyes: Negative.   Respiratory: Negative.   Cardiovascular: Negative.   Gastrointestinal: Positive for heartburn, nausea and vomiting. Negative for abdominal pain, blood in stool and melena.  Genitourinary: Negative.   Musculoskeletal: Negative.   Skin: Negative.   Neurological: Negative.     All systems reviewed and otherwise negative except for as above  Family History  Problem Relation Age of Onset  . Dementia Mother   . Cancer Father   . Gout Father   . Thyroid disease Sister   . Kidney disease Brother   . Gout Brother   .  Diabetes Maternal Grandmother   . Stroke Maternal Grandfather     Past Medical History:  Diagnosis Date  . COPD (chronic obstructive pulmonary disease) (HCC)   . Gout   . Hypertension     History reviewed. No pertinent surgical history.  Social History:  reports that he quit smoking about 8 years ago. His smoking use included cigarettes. He has a 86.00 pack-year smoking history. he has never used smokeless tobacco. He reports that he drinks alcohol. He reports that he does not use drugs.  Allergies: No Known Allergies  Medications Prior to Admission  Medication Sig Dispense Refill  . albuterol (PROAIR HFA) 108 (90 Base) MCG/ACT inhaler 2 puffs every 4 hours as needed only  if your can't catch your breath 1 Inhaler 11  . amLODipine (NORVASC) 10 MG tablet Take 1 tablet (10 mg total) by mouth daily. 90 tablet 3  . clotrimazole-betamethasone (LOTRISONE) cream Apply 1 application topically 2 (two) times daily. 30 g 1  . indomethacin (INDOCIN) 50 MG capsule Take 1 capsule (50 mg total) by mouth every 8 (eight) hours as needed. (Patient taking differently: Take 50 mg by mouth every 8 (eight) hours as needed for mild pain. ) 30 capsule 5  . ondansetron (ZOFRAN) 4 MG tablet Take 1 tablet (4 mg total) by mouth every 8 (eight) hours as needed for nausea or vomiting. 20 tablet 0  . SYMBICORT 160-4.5 MCG/ACT inhaler Inhale 2 puffs into the lungs 2 (two) times daily. 1 Inhaler 11  . HYDROcodone-acetaminophen (NORCO/VICODIN) 5-325 MG tablet Take 1 tablet by mouth every 6 (six) hours as needed. (Patient not  taking: Reported on 06/29/2017) 30 tablet 0  . Tiotropium Bromide Monohydrate (SPIRIVA RESPIMAT) 2.5 MCG/ACT AERS Inhale 2 puffs into the lungs daily. (Patient not taking: Reported on 06/29/2017) 1 Inhaler 11  . zolpidem (AMBIEN) 5 MG tablet Take 1 tablet daily at bedtime as needed (Patient not taking: Reported on 12/31/2016) 30 tablet 0    Prior to Admission medications   Medication Sig Start Date End  Date Taking? Authorizing Provider  albuterol (PROAIR HFA) 108 (90 Base) MCG/ACT inhaler 2 puffs every 4 hours as needed only  if your can't catch your breath 05/03/16  Yes Nyoka Cowden, MD  amLODipine (NORVASC) 10 MG tablet Take 1 tablet (10 mg total) by mouth daily. 01/04/17  Yes Sagardia, Eilleen Kempf, MD  clotrimazole-betamethasone (LOTRISONE) cream Apply 1 application topically 2 (two) times daily. 01/04/17  Yes Sagardia, Eilleen Kempf, MD  indomethacin (INDOCIN) 50 MG capsule Take 1 capsule (50 mg total) by mouth every 8 (eight) hours as needed. Patient taking differently: Take 50 mg by mouth every 8 (eight) hours as needed for mild pain.  01/04/17  Yes Sagardia, Eilleen Kempf, MD  ondansetron (ZOFRAN) 4 MG tablet Take 1 tablet (4 mg total) by mouth every 8 (eight) hours as needed for nausea or vomiting. 06/29/17  Yes Sagardia, Eilleen Kempf, MD  SYMBICORT 160-4.5 MCG/ACT inhaler Inhale 2 puffs into the lungs 2 (two) times daily. 01/24/17  Yes Nyoka Cowden, MD  HYDROcodone-acetaminophen (NORCO/VICODIN) 5-325 MG tablet Take 1 tablet by mouth every 6 (six) hours as needed. Patient not taking: Reported on 06/29/2017 05/01/15   Le, Thao P, DO  Tiotropium Bromide Monohydrate (SPIRIVA RESPIMAT) 2.5 MCG/ACT AERS Inhale 2 puffs into the lungs daily. Patient not taking: Reported on 06/29/2017 12/31/16   Nyoka Cowden, MD  zolpidem (AMBIEN) 5 MG tablet Take 1 tablet daily at bedtime as needed Patient not taking: Reported on 12/31/2016 05/01/15   Le, Thao P, DO    Blood pressure 118/76, pulse 73, temperature 97.9 F (36.6 C), temperature source Oral, resp. rate 17, height 5' 7.25" (1.708 m), weight 120 lb (54.4 kg), SpO2 96 %. Physical Exam: General: pleasant, frail appearing white male who is laying in bed in NAD HEENT: head is normocephalic, atraumatic.  Sclera are noninjected.  Pupils equal and round.  Ears and nose without any masses or lesions.  Mouth is pink and moist. Dentition fair Heart: regular, rate, and  rhythm.  No obvious murmurs, gallops, or rubs noted.  Palpable pedal pulses bilaterally Lungs: CTAB, no wheezes, rhonchi, or rales noted.  Respiratory effort nonlabored Abd: soft, NT/ND, +BS, no masses, hernias, or organomegaly MS: all 4 extremities are symmetrical with no cyanosis, clubbing, or edema. Skin: warm and dry with no masses, lesions, or rashes Psych: A&Ox3 with an appropriate affect. Neuro: cranial nerves grossly intact, extremity CSM intact bilaterally, normal speech  Results for orders placed or performed during the hospital encounter of 07/07/17 (from the past 48 hour(s))  Comprehensive metabolic panel     Status: Abnormal   Collection Time: 07/12/17  3:42 AM  Result Value Ref Range   Sodium 135 135 - 145 mmol/L   Potassium 3.5 3.5 - 5.1 mmol/L   Chloride 91 (L) 101 - 111 mmol/L   CO2 32 22 - 32 mmol/L   Glucose, Bld 94 65 - 99 mg/dL   BUN 6 6 - 20 mg/dL   Creatinine, Ser 9.14 0.61 - 1.24 mg/dL   Calcium 8.9 8.9 - 78.2 mg/dL   Total  Protein 6.6 6.5 - 8.1 g/dL   Albumin 3.7 3.5 - 5.0 g/dL   AST 32 15 - 41 U/L   ALT 21 17 - 63 U/L   Alkaline Phosphatase 87 38 - 126 U/L   Total Bilirubin 0.6 0.3 - 1.2 mg/dL   GFR calc non Af Amer >60 >60 mL/min   GFR calc Af Amer >60 >60 mL/min    Comment: (NOTE) The eGFR has been calculated using the CKD EPI equation. This calculation has not been validated in all clinical situations. eGFR's persistently <60 mL/min signify possible Chronic Kidney Disease.    Anion gap 12 5 - 15  Magnesium     Status: None   Collection Time: 07/12/17  3:42 AM  Result Value Ref Range   Magnesium 2.2 1.7 - 2.4 mg/dL  Lipase, blood     Status: None   Collection Time: 07/12/17  3:42 AM  Result Value Ref Range   Lipase 39 11 - 51 U/L  HIV antibody     Status: None   Collection Time: 07/12/17  3:42 AM  Result Value Ref Range   HIV Screen 4th Generation wRfx Non Reactive Non Reactive    Comment: (NOTE) Performed At: [facility] [address] [place], [place] [id number] Jolene Schimke MD OZ:[id number]   Basic metabolic panel     Status: Abnormal   Collection Time: 07/13/17  3:23 AM  Result Value Ref Range   Sodium 137 135 - 145 mmol/L   Potassium 3.5 3.5 - 5.1 mmol/L   Chloride 96 (L) 101 - 111 mmol/L   CO2 31 22 - 32 mmol/L   Glucose, Bld 74 65 - 99 mg/dL   BUN 5 (L) 6 - 20 mg/dL   Creatinine, Ser 6.29 0.61 - 1.24 mg/dL   Calcium 8.8 (L) 8.9 - 10.3 mg/dL   GFR calc non Af Amer >60 >60 mL/min   GFR calc Af Amer >60 >60 mL/min    Comment: (NOTE) The eGFR has been calculated using the CKD EPI equation. This calculation has not been validated in all clinical situations. eGFR's persistently <60 mL/min signify possible Chronic Kidney Disease.    Anion gap 10 5 - 15   No results found.  Anti-infectives (From admission, onward)   Start     Dose/Rate Route Frequency Ordered Stop   07/11/17 1600  ampicillin-sulbactam (UNASYN) 1.5 g in sodium chloride 0.9 % 50 mL IVPB     1.5 g 100 mL/hr over 30 Minutes Intravenous Every 6 hours 07/11/17 1329     07/08/17 1000  cefTRIAXone (ROCEPHIN) 1 g in dextrose 5 % 50 mL IVPB  Status:  Discontinued     1 g 100 mL/hr over 30 Minutes Intravenous Every 24 hours 07/08/17 0954 07/11/17 1315   07/08/17 1000  azithromycin (ZITHROMAX) 500 mg in dextrose 5 % 250 mL IVPB  Status:  Discontinued     500 mg 250 mL/hr over 60 Minutes Intravenous Every 24 hours 07/08/17 0954 07/11/17 1315       Assessment/Plan COPD oxygen dependent HTN Alcohol abuse H/o gout Malnutrition  CAP - on Unasyn  Esophageal stenosis 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 Once diagnosis is obtained recommend contacting CVTS if surgery is needed, and medical and radiation oncology if biopsy is positive for malignancy.  The other concern is that the patient is malnourished and unable to tolerate a diet by mouth. He may need a G or J tube for feeding. Would be less invasive to have PEG placed by GI or IR; if unable to place PEG due to anatomy, then general surgery would be available to consider open G/J tube placement.  Franne Forts, [facility] Surgery 07/13/2017, 1:01 PM Pager: [phone] Consults: [phone] Mon-Fri 7:00 am-4:30 pm Sat-Sun 7:00 am-11:30 am

## 2017-07-13 NOTE — Evaluation (Signed)
Occupational Therapy Evaluation Patient Details Name: George Stokes MRN: [id number] DOB: 06-03-1946 Today's Date: 07/13/2017    History of Present Illness 72 y.o. male with medical history significant of COPD, CRF on 4 liters he thinks at home, htn comes in with over a week of nausea and vomiting.  EGD performed 07/13/17 found malignant appearing esophageal stenosis - biopsy sent.   Dx of PNA   Clinical Impression   Pt admitted with the above, and demonstrates the below listed deficits.  He fatigues quickly with activity, but reports this is his baseline.  He reports he is very sedentary at home spending at least 12 hours in front of computer a day.  He sponge bathe sporadically, and cleans very little.  He reports he is content with this lifestyle and is not interested in modifications or changing activity.   He independently incorporates rest breaks and activity modification into daily routine.  He is able to perform ADLs at mod I level.  He currently does not need further OT.  He may benefit later if functional status changes with treatment in light of new EGD findings.  At this time OT will sign off.  Please reorder if our services become needed.      Follow Up Recommendations  No OT follow up;Supervision - Intermittent    Equipment Recommendations  None recommended by OT    Recommendations for Other Services       Precautions / Restrictions        Mobility Bed Mobility Overal bed mobility: Modified Independent                Transfers Overall transfer level: Modified independent                    Balance Overall balance assessment: No apparent balance deficits (not formally assessed)                                         ADL either performed or assessed with clinical judgement   ADL Overall ADL's : Modified independent                                       General ADL Comments: Pt is able to perform ADLs at mod I level,  but requires rest breaks due to DOE.  He reports this is his baseline. Discussed shower seat, he is not interested and is content to sponge bathe and he reports he is content with his sedentary lifestyle.  He does take frequent seated rest breaks and incorporates energy conservation techniques into his limited activity      Vision Baseline Vision/History: Wears glasses Wears Glasses: At all times Patient Visual Report: No change from baseline       Perception     Praxis      Pertinent Vitals/Pain Pain Assessment: No/denies pain     Hand Dominance Right   Extremity/Trunk Assessment Upper Extremity Assessment Upper Extremity Assessment: Generalized weakness   Lower Extremity Assessment Lower Extremity Assessment: Generalized weakness   Cervical / Trunk Assessment Cervical / Trunk Assessment: Kyphotic   Communication Communication Communication: No difficulties   Cognition Arousal/Alertness: Awake/alert Behavior During Therapy: WFL for tasks assessed/performed Overall Cognitive Status: Within Functional Limits for tasks assessed  General Comments  DOE 3/4 - 4/4 on 2L supplemental 02.  He reports this is his baseline     Exercises     Shoulder Instructions      Home Living Family/patient expects to be discharged to:: Private residence Living Arrangements: Alone Available Help at Discharge: Friend(s);Available PRN/intermittently Type of Home: Apartment Home Access: Stairs to enter Entrance Stairs-Number of Steps: 8   Home Layout: One level     Bathroom Shower/Tub: Chief Strategy Officer: Standard     Home Equipment: Cane - single point;Other (comment)          Prior Functioning/Environment Level of Independence: Independent with assistive device(s)        Comments: Pt ambulates with SPC.  He leans on grocery cart to get groceries.  He sponge bathes periodically.   He reports he is very  sedentary and spends at least 12 hours/day on the computer.  He marginally cleans         OT Problem List: Decreased activity tolerance      OT Treatment/Interventions:      OT Goals(Current goals can be found in the care plan section) Acute Rehab OT Goals Patient Stated Goal: Pt unable to generate an OT goal as he states he is content with his current activity level  OT Goal Formulation: All assessment and education complete, DC therapy  OT Frequency:     Barriers to D/C:            Co-evaluation              AM-PAC PT "6 Clicks" Daily Activity     Outcome Measure Help from another person eating meals?: None Help from another person taking care of personal grooming?: None Help from another person toileting, which includes using toliet, bedpan, or urinal?: None Help from another person bathing (including washing, rinsing, drying)?: None Help from another person to put on and taking off regular upper body clothing?: None Help from another person to put on and taking off regular lower body clothing?: None 6 Click Score: 24   End of Session Equipment Utilized During Treatment: Oxygen Nurse Communication: Mobility status  Activity Tolerance: Patient tolerated treatment well Patient left: in bed;with call bell/phone within reach  OT Visit Diagnosis: Muscle weakness (generalized) (M62.81)                Time: 8657-8469 OT Time Calculation (min): 31 min Charges:  OT Evaluation $OT Eval Moderate Complexity: 1 Mod OT Treatments $Therapeutic Activity: 8-22 mins G-Codes:     [employer], OTR/L [phone]   Jeani Hawking M 07/13/2017, 4:00 PM

## 2017-07-13 NOTE — Op Note (Signed)
[facility] Patient Name: George Stokes Procedure Date: 07/13/2017 MRN: [id number] Attending MD: Napoleon Form , MD Date of Birth: Jan 13, 1946 CSN: [id number] Age: 72 Admit Type: Inpatient Procedure:                Upper GI endoscopy Indications:              Dysphagia, Abnormal CT of the GI tract Providers:                Napoleon Form, MD, Leandrew Koyanagi, RN,                            Tomma Rakers, RN, Arlee Muslim Tech.,                            Technician, Mirian Mo, CRNA Referring MD:              Medicines:                Monitored Anesthesia Care Complications:            No immediate complications. Estimated Blood Loss:     Estimated blood loss was minimal. Procedure:                Pre-Anesthesia Assessment:                           - Prior to the procedure, a History and Physical                            was performed, and patient medications and                            allergies were reviewed. The patient's tolerance of                            previous anesthesia was also reviewed. The risks                            and benefits of the procedure and the sedation                            options and risks were discussed with the patient.                            All questions were answered, and informed consent                            was obtained. Prior Anticoagulants: The patient has                            taken no previous anticoagulant or antiplatelet                            agents. ASA Grade Assessment: III - A patient with  severe systemic disease. After reviewing the risks                            and benefits, the patient was deemed in                            satisfactory condition to undergo the procedure.                           After obtaining informed consent, the endoscope was                            passed under direct vision. Throughout the         procedure, the patient's blood pressure, pulse, and                            oxygen saturations were monitored continuously. The                            Endoscope was introduced through the mouth, and                            advanced to the second part of duodenum. The upper                            GI endoscopy was performed with difficulty due to                            presence of bezoar, stenosis and poor endoscopic                            visualization. Successful completion of the                            procedure was aided by lavage. The patient                            tolerated the procedure well. Scope In: Scope Out: Findings:      Fluid with secretions and barium was found in the middle to lower third       of the esophagus.      One severe (stenosis; an endoscope cannot pass) malignant-appearing,       intrinsic stenosis was found 40 to 42 cm from the incisors. This       measured 5 mm (inner diameter) x 2 cm (in length) and was traversed       after switching to pediatric endoscope. Biopsies were taken with a cold       forceps for histology.      A small, infiltrative, circumferential mass with no bleeding and no       stigmata of recent bleeding was found at the gastroesophageal junction.       Biopsies were taken with a cold forceps for histology. Otherwise stomach      The examined duodenum was normal. Impression:               -  Malignant-appearing esophageal stenosis. Biopsied.                           - Rule out malignancy, gastric tumor at the                            gastroesophageal junction. Biopsied.                           - Normal examined duodenum. Moderate Sedation:      N/A- Per Anesthesia Care Recommendation:           - Clear liquid diet, small sip of water and ice                            chips.                           - Continue present medications.                           - Await pathology results.                            - Repeat upper endoscopy after studies are complete                            for surveillance based on pathology results. Procedure Code(s):        --- Professional ---                           [phone], Esophagogastroduodenoscopy, flexible,                            transoral; with biopsy, single or multiple Diagnosis Code(s):        --- Professional ---                           K22.2, Esophageal obstruction                           D49.0, Neoplasm of unspecified behavior of                            digestive system                           R13.10, Dysphagia, unspecified                           R93.3, Abnormal findings on diagnostic imaging of                            other parts of digestive tract CPT copyright 2016 [employer]. All rights reserved. The codes documented in this report are preliminary and upon coder review may  be revised to meet current compliance requirements. Napoleon Form, MD 07/13/2017 11:05:28 AM This report has been signed electronically. Number  of Addenda: 0

## 2017-07-13 NOTE — Interval H&P Note (Signed)
History and Physical Interval Note:  07/13/2017 9:29 AM  George Stokes  has presented today for surgery, with the diagnosis of Dysphagia  The various methods of treatment have been discussed with the patient and family. After consideration of risks, benefits and other options for treatment, the patient has consented to  Procedure(s): ESOPHAGOGASTRODUODENOSCOPY (EGD) WITH PROPOFOL (N/A) as a surgical intervention .  The patient's history has been reviewed, patient examined, no change in status, stable for surgery.  I have reviewed the patient's chart and labs.  Questions were answered to the patient's satisfaction.     Tahjanae Blankenburg

## 2017-07-14 ENCOUNTER — Encounter (HOSPITAL_COMMUNITY): Payer: Self-pay | Admitting: Gastroenterology

## 2017-07-14 DIAGNOSIS — E46 Unspecified protein-calorie malnutrition: Secondary | ICD-10-CM

## 2017-07-14 DIAGNOSIS — C159 Malignant neoplasm of esophagus, unspecified: Secondary | ICD-10-CM

## 2017-07-14 MED ORDER — POTASSIUM CHLORIDE 2 MEQ/ML IV SOLN
INTRAVENOUS | Status: DC
  Administered 2017-07-14 – 2017-07-15 (×2): via INTRAVENOUS
  Filled 2017-07-14 (×4): qty 1000

## 2017-07-14 MED ORDER — CHLORHEXIDINE GLUCONATE CLOTH 2 % EX PADS
6.0000 | MEDICATED_PAD | Freq: Once | CUTANEOUS | Status: AC
  Administered 2017-07-15: 6 via TOPICAL

## 2017-07-14 MED ORDER — CHLORHEXIDINE GLUCONATE CLOTH 2 % EX PADS
6.0000 | MEDICATED_PAD | Freq: Once | CUTANEOUS | Status: AC
  Administered 2017-07-14: 6 via TOPICAL

## 2017-07-14 MED ORDER — CEFAZOLIN SODIUM-DEXTROSE 2-4 GM/100ML-% IV SOLN
2.0000 g | INTRAVENOUS | Status: AC
  Administered 2017-07-15: 2 g via INTRAVENOUS
  Filled 2017-07-14 (×2): qty 100

## 2017-07-14 NOTE — Progress Notes (Signed)
1 Day Post-Op    CC: Esophageal mass/stenosis/obstruction  Subjective: We have been requested to see and evaluate for feeding tube placement.  We have been told that Dr. Servando Snare will not be able to operate on him for some time, and that he will need jejunal feeding tube prior to Dr. Servando Snare surgery.  Objective: Vital signs in last 24 hours: Temp:  [97.9 F (36.6 C)-98.6 F (37 C)] 98.6 F (37 C) (01/17 0457) Pulse Rate:  [73-86] 73 (01/17 0457) Resp:  [16-18] 18 (01/17 0457) BP: (114-146)/(73-86) 146/86 (01/17 0457) SpO2:  [94 %-97 %] 96 % (01/17 0457) Last BM Date: 07/12/17 480 p.o.  1200 IV 750 urine Afebrile vital signs are stable Pre-albumin 11.6 today no other labs today.    Intake/Output from previous day: 01/16 0701 - 01/17 0700 In: 1616.3 [P.O.:480; I.V.:836.3; IV Piggyback:300] Out: 750 [Urine:750] Intake/Output this shift: Total I/O In: 120 [P.O.:120] Out: -   General appearance: alert, cooperative, no distress and Appears chronically malnourished GI: soft, non-tender; bowel sounds normal; no masses,  no organomegaly  Lab Results:  No results for input(s): WBC, HGB, HCT, PLT in the last 72 hours.  BMET Recent Labs    07/12/17 0342 07/13/17 0323  NA 135 137  K 3.5 3.5  CL 91* 96*  CO2 32 31  GLUCOSE 94 74  BUN 6 5*  CREATININE 1.06 0.99  CALCIUM 8.9 8.8*   PT/INR No results for input(s): LABPROT, INR in the last 72 hours.  Recent Labs  Lab 07/07/17 1550 07/10/17 0349 07/12/17 0342  AST 26 24 32  ALT 22 16* 21  ALKPHOS 96 62 87  BILITOT 1.4* 1.0 0.6  PROT 7.1 5.2* 6.6  ALBUMIN 3.9 2.9* 3.7     Lipase     Component Value Date/Time   LIPASE 39 07/12/2017 0342     Medications: . feeding supplement (ENSURE ENLIVE)  237 mL Oral BID WC  . guaiFENesin  600 mg Oral BID  . ipratropium-albuterol  3 mL Nebulization TID  . mometasone-formoterol  2 puff Inhalation BID  . multivitamin with minerals  1 tablet Oral Daily  . pantoprazole  (PROTONIX) IV  40 mg Intravenous Q12H   . sodium chloride 75 mL/hr at 07/13/17 1211  . ampicillin-sulbactam (UNASYN) IV 1.5 g (07/14/17 0936)   Anti-infectives (From admission, onward)   Start     Dose/Rate Route Frequency Ordered Stop   07/11/17 1600  ampicillin-sulbactam (UNASYN) 1.5 g in sodium chloride 0.9 % 50 mL IVPB     1.5 g 100 mL/hr over 30 Minutes Intravenous Every 6 hours 07/11/17 1329     07/08/17 1000  cefTRIAXone (ROCEPHIN) 1 g in dextrose 5 % 50 mL IVPB  Status:  Discontinued     1 g 100 mL/hr over 30 Minutes Intravenous Every 24 hours 07/08/17 0954 07/11/17 1315   07/08/17 1000  azithromycin (ZITHROMAX) 500 mg in dextrose 5 % 250 mL IVPB  Status:  Discontinued     500 mg 250 mL/hr over 60 Minutes Intravenous Every 24 hours 07/08/17 0954 07/11/17 1315      Assessment/Plan 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 Esophageal stenosis - admitted 1/11 with intractable n/v - DG esophagus 1/14 showed severe stricture of the distal esophagus just above the GE junction, secondary esophageal dysmotility - upper endoscopy 1/16 showed malignant-appearing esophageal stenosis, gastric tumor at the gastroesophageal junction, normal examined duodenum - biopsy pending   COPD/emphysema oxygen dependent - 4 L nasal cannula Community-acquired pneumonia right  middle lobe and right lower lobe- on Unasyn HTN Alcohol abuse -6 pack/day H/o gout Malnutrition -prealbumin 11.6 Body mass index is 18.6  FEN: IV fluids/clear liquids ID: Azithromycin & Rocephin 11/1 -05/11/18; Unasyn 11/14 =>> day 4 DVT: SCDs only Foley: None Follow-up: To be determined   Plan: We will review with Dr. Dalbert Batman.    LOS: 6 days    George Stokes 07/14/2017 [phone]

## 2017-07-14 NOTE — Progress Notes (Signed)
PT Cancellation Note  Patient Details Name: George Stokes MRN: [id number] DOB: May 14, 1946   Cancelled Treatment:     pt politely declined requesting to rest.  Pt has been evaluated  and amb >400 feet.   Rica Koyanagi  PTA [facility]  Acute  Rehab Pager      [phone]

## 2017-07-14 NOTE — Progress Notes (Signed)
[facility] PROGRESS NOTE    Progress Note  George Stokes  UJW:[id number] DOB: [birth year] DOA: 07/07/2017 PCP: Patient, No Pcp Per     Brief Narrative:   George Stokes is an 72 y.o. male past medical history significant for COPD on home oxygen comes for 1 week of nausea vomiting he denies any abdominal pain or hematemesis no diarrhea.  Assessment/Plan:   Intractable nausea and vomiting due to esophageal stenosis: He stopped all about a month ago due to hiccups intractable nausea vomiting and weight loss CT scan of the abdomen pelvis showed no acute findings. He was started on PPI IV antiemetics GI was consulted recommended an EGD performed on 07/13/2016 that showed malignant appearing esophageal stenosis biopsies were done. Awaiting biopsy results. We will get a nutrition consult, unlikely the patient will tolerate a soft diet as he was throwing up at home with only eating 2 spoons of food. Consult surgery for J-tube placement for nutritional needs.  Likely aspiration pneumonia: From intractable nausea and vomiting On IV Unasyn he has defervesced leukocytosis is improved. Lungs are clear.  Acute kidney injury: Likely prerenal in etiology resolved with IV fluid hydration.  Hypokalemia/hypomagnesemia: Continue to replace orally and monitor.  Bilateral renal hypodense lesions seen on CT: [place] confirmed renal cyst  COPD oxygen dependent: No wheezing stable  History of gout: Start oral allopurinol was able to take orals.  Alcohol use: No signs of withdrawal.  Malnutrition of moderate degree Likely due to alcohol abuse in the setting of esophageal stenosis.  Ensure 3 times daily.   DVT prophylaxis: lovenox Family Communication:none Disposition Plan/Barrier to D/C: unable to determine Code Status:     Code Status Orders  (From admission, onward)        Start     Ordered   07/08/17 0955  Full code  Continuous     07/08/17 0954    Code Status History    Date  Active Date Inactive Code Status Order ID Comments User Context   This patient has a current code status but no historical code status.        IV Access:    Peripheral IV   Procedures and diagnostic studies:   No results found.   Medical Consultants:    None.  Anti-Infectives:   None  Subjective:    George Stokes no further complaints, has no pain.  Objective:    Vitals:   07/13/17 1505 07/13/17 2007 07/13/17 2056 07/14/17 0457  BP:   114/73 (!) 146/86  Pulse:   86 73  Resp:   18 18  Temp:   98.5 F (36.9 C) 98.6 F (37 C)  TempSrc:   Oral Oral  SpO2: 94% 96% 97% 96%  Weight:      Height:        Intake/Output Summary (Last 24 hours) at 07/14/2017 0933 Last data filed at 07/14/2017 0458 Gross per 24 hour  Intake 1616.25 ml  Output 550 ml  Net 1066.25 ml   Filed Weights   07/07/17 1543 07/13/17 0922  Weight: 54.4 kg (120 lb) 54.4 kg (120 lb)    Exam: General exam: In no acute distress, cachectic appearing. Respiratory system: Good air movement and clear to auscultation. Cardiovascular system: S1 & S2 heard, RRR.  Gastrointestinal system: Abdomen is nondistended, soft and nontender.  Central nervous system: Alert and oriented. No focal neurological deficits. Extremities: No pedal edema. Skin: No rashes, lesions or ulcers Psychiatry: Judgement and insight appear normal. Mood & affect appropriate.  Data Reviewed:    Labs: Basic Metabolic Panel: Recent Labs  Lab 07/09/17 0351 07/10/17 0349 07/11/17 0406 07/12/17 0342 07/13/17 0323  NA 141 138 134* 135 137  K 3.7 3.8 3.2* 3.5 3.5  CL 101 99* 93* 91* 96*  CO2 27 31 32 32 31  GLUCOSE 59* 99 107* 94 74  BUN 26* 12 6 6  5*  CREATININE 1.19 1.01 0.91 1.06 0.99  CALCIUM 8.8* 8.7* 8.8* 8.9 8.8*  MG  --  1.2* 1.6* 2.2  --    GFR Estimated Creatinine Clearance: 54.2 mL/min (by C-G formula based on SCr of 0.99 mg/dL). Liver Function Tests: Recent Labs  Lab 07/07/17 1550 07/10/17 0349  07/12/17 0342  AST 26 24 32  ALT 22 16* 21  ALKPHOS 96 62 87  BILITOT 1.4* 1.0 0.6  PROT 7.1 5.2* 6.6  ALBUMIN 3.9 2.9* 3.7   Recent Labs  Lab 07/07/17 1550 07/09/17 0347 07/11/17 0406 07/12/17 0342  LIPASE 52* 53* 49 39   No results for input(s): AMMONIA in the last 168 hours. Coagulation profile Recent Labs  Lab 07/11/17 0406  INR 1.06    CBC: Recent Labs  Lab 07/07/17 1550 07/09/17 0351 07/10/17 0349  WBC 17.3* 8.7 8.5  NEUTROABS  --  6.9 6.5  HGB 17.6* 14.2 13.8  HCT 51.0 42.0 41.9  MCV 88.1 88.8 89.3  PLT 266 178 190   Cardiac Enzymes: No results for input(s): CKTOTAL, CKMB, CKMBINDEX, TROPONINI in the last 168 hours. BNP (last 3 results) No results for input(s): PROBNP in the last 8760 hours. CBG: No results for input(s): GLUCAP in the last 168 hours. D-Dimer: No results for input(s): DDIMER in the last 72 hours. Hgb A1c: No results for input(s): HGBA1C in the last 72 hours. Lipid Profile: No results for input(s): CHOL, HDL, LDLCALC, TRIG, CHOLHDL, LDLDIRECT in the last 72 hours. Thyroid function studies: No results for input(s): TSH, T4TOTAL, T3FREE, THYROIDAB in the last 72 hours.  Invalid input(s): FREET3 Anemia work up: No results for input(s): VITAMINB12, FOLATE, FERRITIN, TIBC, IRON, RETICCTPCT in the last 72 hours. Sepsis Labs: Recent Labs  Lab 07/07/17 1550 07/09/17 0351 07/10/17 0349  WBC 17.3* 8.7 8.5   Microbiology Recent Results (from the past 240 hour(s))  Culture, blood (routine x 2) Call MD if unable to obtain prior to antibiotics being given     Status: None   Collection Time: 07/08/17 10:41 AM  Result Value Ref Range Status   Specimen Description BLOOD LEFT ANTECUBITAL  Final   Special Requests   Final    BOTTLES DRAWN AEROBIC AND ANAEROBIC Blood Culture adequate volume   Culture   Final    NO GROWTH 5 DAYS Performed at [facility], [address] [place], [place] [address]    Report Status 07/13/2017 FINAL   Final  Culture, blood (routine x 2) Call MD if unable to obtain prior to antibiotics being given     Status: None   Collection Time: 07/08/17 11:20 AM  Result Value Ref Range Status   Specimen Description BLOOD RIGHT FOREARM  Final   Special Requests   Final    BOTTLES DRAWN AEROBIC AND ANAEROBIC Blood Culture adequate volume   Culture   Final    NO GROWTH 5 DAYS Performed at [facility], [address] [place], [place] [address]    Report Status 07/13/2017 FINAL  Final     Medications:   . feeding supplement (ENSURE ENLIVE)  237  mL Oral BID WC  . guaiFENesin  600 mg Oral BID  . ipratropium-albuterol  3 mL Nebulization TID  . mometasone-formoterol  2 puff Inhalation BID  . multivitamin with minerals  1 tablet Oral Daily  . pantoprazole (PROTONIX) IV  40 mg Intravenous Q12H   Continuous Infusions: . sodium chloride 75 mL/hr at 07/13/17 1211  . ampicillin-sulbactam (UNASYN) IV 1.5 g (07/14/17 0533)     LOS: 6 days   Marinda Elk  [facility] Pager [phone]  *Please refer to [email or web address], password [email or web address] to get updated schedule on who will round on this patient, as hospitalists switch teams weekly. If 7PM-7AM, please contact night-coverage at [email or web address], password [email or web address] for any overnight needs.  07/14/2017, 9:33 AM

## 2017-07-14 NOTE — Consult Note (Addendum)
New Hematology/Oncology Consult   Referral MD: Charlynne Cousins     Reason for Referral: Esophagus cancer  HPI: George Stokes was admitted 07/08/2017 with nausea/vomiting.  He reports "regurgitation "of liquids and solids for the past few months.  He has lost 30-40 pounds.  CTs of the chest, abdomen, and pelvis on 07/08/2017 revealed changes of emphysema and patchy airspace disease consistent with pneumonia.  No bowel obstruction.  An esophagram on 07/11/2017 revealed narrowing of the distal esophagus just above the GE junction.  There was a trace of contrast passing into the stomach after a delay.  Gastroenterology was consulted and he was taken to an upper endoscopy 07/13/2017.  Fluid with secretions and barium were found in the middle and lower esophagus.  A severe malignant appearing stenosis was found at 40-42 cm from the incisors.  An endoscope could not pass the obstruction.  The mass was traversed with a pediatric endoscope and biopsies were taken.  The pathology ([id number]) revealed an atypical mucosal fragment suspicious for adenocarcinoma.     Past Medical History:  Diagnosis Date  . COPD (chronic obstructive pulmonary disease) ([place])   . Gout   . Hypertension   :  Past Surgical History:  Procedure Laterality Date  . ESOPHAGOGASTRODUODENOSCOPY (EGD) WITH PROPOFOL N/A 07/13/2017   Procedure: ESOPHAGOGASTRODUODENOSCOPY (EGD) WITH PROPOFOL;  Surgeon: Mauri Pole, MD;  Location: [facility];  Service: Endoscopy;  Laterality: N/A;  : Tonsillectomy   Current Facility-Administered Medications:  .  albuterol (PROVENTIL) (2.5 MG/3ML) 0.083% nebulizer solution 2.5 mg, 2.5 mg, Nebulization, Q4H PRN, Alcario Drought, Jared M, DO .  ampicillin-sulbactam (UNASYN) 1.5 g in sodium chloride 0.9 % 50 mL IVPB, 1.5 g, Intravenous, Q6H, Green, Terri L, RPH, Stopped at 07/14/17 1010 .  guaiFENesin (MUCINEX) 12 hr tablet 600 mg, 600 mg, Oral, BID, Florencia Reasons, MD, 600 mg at 07/14/17 0936 .   indomethacin (INDOCIN) capsule 50 mg, 50 mg, Oral, Q8H PRN, Derrill Kay A, MD .  ipratropium-albuterol (DUONEB) 0.5-2.5 (3) MG/3ML nebulizer solution 3 mL, 3 mL, Nebulization, TID, Florencia Reasons, MD, 3 mL at 07/14/17 1412 .  lactated ringers 1,000 mL with potassium chloride 20 mEq infusion, , Intravenous, Continuous, Fanny Skates, MD .  mometasone-formoterol [facility]) 200-5 MCG/ACT inhaler 2 puff, 2 puff, Inhalation, BID, Florencia Reasons, MD, 2 puff at 07/14/17 0902 .  multivitamin with minerals tablet 1 tablet, 1 tablet, Oral, Daily, Florencia Reasons, MD, 1 tablet at 07/14/17 0936 .  pantoprazole (PROTONIX) injection 40 mg, 40 mg, Intravenous, Q12H, Florencia Reasons, MD, 40 mg at 07/14/17 0939 .  promethazine (PHENERGAN) injection 6.25 mg, 6.25 mg, Intravenous, Q6H PRN, Florencia Reasons, MD, 6.25 mg at 07/11/17 1046:  . guaiFENesin  600 mg Oral BID  . ipratropium-albuterol  3 mL Nebulization TID  . mometasone-formoterol  2 puff Inhalation BID  . multivitamin with minerals  1 tablet Oral Daily  . pantoprazole (PROTONIX) IV  40 mg Intravenous Q12H  :  No Known Allergies:  FH: His father died of pancreas cancer.  A brother died of a brain tumor.  A maternal great uncle had head and neck cancer.  No other family history of cancer  SOCIAL HISTORY: He lives alone in [place].  He is a retired Industrial/product designer.  He quit smoking cigarettes at age 76.  He reports heavy alcohol use.  No risk factor for HIV or hepatitis.  Review of Systems:  Positives include: Anorexia, weight loss, nausea/vomiting, solid/liquid dysphagia and regurgitation, exertional dyspnea, constipation, dark urine  A  complete ROS was otherwise negative.   Physical Exam:  Blood pressure (!) 154/92, pulse 81, temperature 98 F (36.7 C), temperature source Oral, resp. rate 16, height 5' 7.25" (1.708 m), weight 120 lb (54.4 kg), SpO2 96 %.  HEENT: Oral cavity without visible mass, neck without mass Lungs: Distant breath sounds, no respiratory  distress Cardiac: Regular rate and rhythm Abdomen: No hepatosplenomegaly, no mass, nontender GU: Testes without mass Vascular: No leg edema Lymph nodes: No cervical, supraclavicular, axillary, or inguinal nodes Neurologic: Alert and oriented, the motor exam appears intact in the upper and lower extremities Skin: Flat purpuric lesions at the forearm bilaterally Musculoskeletal: No spine tenderness  LABS:   Recent Labs    07/12/17 0342 07/13/17 0323  NA 135 137  K 3.5 3.5  CL 91* 96*  CO2 32 31  GLUCOSE 94 74  BUN 6 5*  CREATININE 1.06 0.99  CALCIUM 8.9 8.8*      RADIOLOGY:  Ct Abdomen Pelvis Wo Contrast  Result Date: 07/08/2017 CLINICAL DATA:  72 year old male with nausea vomiting. EXAM: CT CHEST, ABDOMEN AND PELVIS WITHOUT CONTRAST TECHNIQUE: Multidetector CT imaging of the chest, abdomen and pelvis was performed following the standard protocol without IV contrast. COMPARISON:  Radiograph of the chest abdomen pelvis dated 07/07/2017 FINDINGS: Evaluation of this exam is limited in the absence of intravenous contrast. CT CHEST FINDINGS Cardiovascular: There is no cardiomegaly. Coronary vascular calcification primarily involving the LAD. Small anterior pericardial effusion measuring 8 mm in thickness. There is mild atherosclerotic calcification of the thoracic aorta. The thoracic aorta and central pulmonary arteries are grossly unremarkable on this noncontrast CT. Mediastinum/Nodes: No hilar or mediastinal adenopathy. The esophagus contains a mixed density fluid content with areas of higher attenuation, likely residual oral contrast. Findings likely represent reflux or esophageal dysmotility and delayed emptying. Blood product is less likely. Clinical correlation is recommended. Endoscopy may provide better evaluation of the esophagus if clinically indicated. Lungs/Pleura: There is emphysematous changes of the lungs. Patchy area of nodular airspace density primarily involving the right  middle lobe and anterior right upper lobe most consistent with pneumonia. Clinical correlation and follow-up to resolution recommended. There is mild bronchiectatic changes. No pneumothorax or pleural effusion. Endobronchial content in the region of the carina and proximal portion of the left mainstem bronchus likely mucous plugging. Musculoskeletal: There is degenerative changes of the spine. Old healed left rib fractures. No acute osseous pathology. CT ABDOMEN PELVIS FINDINGS No intra-abdominal free air or free fluid. Hepatobiliary: The liver is unremarkable. No intrahepatic biliary ductal dilatation. There is probable sludge or small stones within the gallbladder. No pericholecystic fluid or evidence of acute cholecystitis by CT. Pancreas: Unremarkable. No pancreatic ductal dilatation or surrounding inflammatory changes. Spleen: Normal in size without focal abnormality. Adrenals/Urinary Tract: The adrenal glands are unremarkable. Multiple bilateral renal hypodense lesions are not characterized on this CT but appear to demonstrate fluid attenuation. Ultrasound may provide better characterisation. Punctate nonobstructing right renal calculi versus vascular calcification. There is no hydronephrosis or obstructing stone on either side. The visualized ureters and urinary bladder appear unremarkable. Stomach/Bowel: There is a small hiatal hernia. Oral contrast opacifies multiple loops of small bowel. There is no evidence of bowel obstruction or active inflammation. There is sigmoid diverticulosis without active inflammation. Normal appendix. Vascular/Lymphatic: Advanced aortoiliac atherosclerotic disease. The IVC is grossly unremarkable. No portal venous gas. There is no adenopathy. Reproductive: The prostate and seminal vesicles are grossly unremarkable. Other: None Musculoskeletal: Osteopenia with degenerative changes of the lower lumbar  spine. No acute osseous pathology. IMPRESSION: 1. Patchy areas of nodular  airspace density primarily in the right middle lobe and right upper lobe most consistent with pneumonia. Clinical correlation and follow-up to resolution after treatment recommended. 2. Emphysema.  No pleural effusion or pneumothorax. 3. Mucous plugging in the proximal left mainstem bronchus. 4. Sigmoid diverticulosis. No bowel obstruction or active inflammation. Normal appendix. 5. Bilateral renal hypodense lesions, incompletely characterized. Ultrasound may provide better evaluation. No hydronephrosis or obstructing stone. 6. Aortic Atherosclerosis (ICD10-I70.0) and Emphysema (ICD10-J43.9). Electronically Signed   By: Anner Crete M.D.   On: 07/08/2017 05:47   Ct Chest Wo Contrast  Result Date: 07/08/2017 CLINICAL DATA:  72 year old male with nausea vomiting. EXAM: CT CHEST, ABDOMEN AND PELVIS WITHOUT CONTRAST TECHNIQUE: Multidetector CT imaging of the chest, abdomen and pelvis was performed following the standard protocol without IV contrast. COMPARISON:  Radiograph of the chest abdomen pelvis dated 07/07/2017 FINDINGS: Evaluation of this exam is limited in the absence of intravenous contrast. CT CHEST FINDINGS Cardiovascular: There is no cardiomegaly. Coronary vascular calcification primarily involving the LAD. Small anterior pericardial effusion measuring 8 mm in thickness. There is mild atherosclerotic calcification of the thoracic aorta. The thoracic aorta and central pulmonary arteries are grossly unremarkable on this noncontrast CT. Mediastinum/Nodes: No hilar or mediastinal adenopathy. The esophagus contains a mixed density fluid content with areas of higher attenuation, likely residual oral contrast. Findings likely represent reflux or esophageal dysmotility and delayed emptying. Blood product is less likely. Clinical correlation is recommended. Endoscopy may provide better evaluation of the esophagus if clinically indicated. Lungs/Pleura: There is emphysematous changes of the lungs. Patchy area  of nodular airspace density primarily involving the right middle lobe and anterior right upper lobe most consistent with pneumonia. Clinical correlation and follow-up to resolution recommended. There is mild bronchiectatic changes. No pneumothorax or pleural effusion. Endobronchial content in the region of the carina and proximal portion of the left mainstem bronchus likely mucous plugging. Musculoskeletal: There is degenerative changes of the spine. Old healed left rib fractures. No acute osseous pathology. CT ABDOMEN PELVIS FINDINGS No intra-abdominal free air or free fluid. Hepatobiliary: The liver is unremarkable. No intrahepatic biliary ductal dilatation. There is probable sludge or small stones within the gallbladder. No pericholecystic fluid or evidence of acute cholecystitis by CT. Pancreas: Unremarkable. No pancreatic ductal dilatation or surrounding inflammatory changes. Spleen: Normal in size without focal abnormality. Adrenals/Urinary Tract: The adrenal glands are unremarkable. Multiple bilateral renal hypodense lesions are not characterized on this CT but appear to demonstrate fluid attenuation. Ultrasound may provide better characterisation. Punctate nonobstructing right renal calculi versus vascular calcification. There is no hydronephrosis or obstructing stone on either side. The visualized ureters and urinary bladder appear unremarkable. Stomach/Bowel: There is a small hiatal hernia. Oral contrast opacifies multiple loops of small bowel. There is no evidence of bowel obstruction or active inflammation. There is sigmoid diverticulosis without active inflammation. Normal appendix. Vascular/Lymphatic: Advanced aortoiliac atherosclerotic disease. The IVC is grossly unremarkable. No portal venous gas. There is no adenopathy. Reproductive: The prostate and seminal vesicles are grossly unremarkable. Other: None Musculoskeletal: Osteopenia with degenerative changes of the lower lumbar spine. No acute  osseous pathology. IMPRESSION: 1. Patchy areas of nodular airspace density primarily in the right middle lobe and right upper lobe most consistent with pneumonia. Clinical correlation and follow-up to resolution after treatment recommended. 2. Emphysema.  No pleural effusion or pneumothorax. 3. Mucous plugging in the proximal left mainstem bronchus. 4. Sigmoid diverticulosis. No bowel obstruction or  active inflammation. Normal appendix. 5. Bilateral renal hypodense lesions, incompletely characterized. Ultrasound may provide better evaluation. No hydronephrosis or obstructing stone. 6. Aortic Atherosclerosis (ICD10-I70.0) and Emphysema (ICD10-J43.9). Electronically Signed   By: Anner Crete M.D.   On: 07/08/2017 05:47   Dg Esophagus  Result Date: 07/11/2017 CLINICAL DATA:  Persistent dysphagia, nausea/vomiting, unable to tolerate more than a tablespoon of food at a time, decreased appetite EXAM: ESOPHOGRAM/BARIUM SWALLOW TECHNIQUE: Single contrast examination was performed using  thin barium. FLUOROSCOPY TIME:  Fluoroscopy Time:  3.1 minutes Radiation Exposure Index (if provided by the fluoroscopic device): 1.9 mGy Number of Acquired Spot Images: 5 COMPARISON:  CT chest abdomen pelvis dated 07/08/2017 FINDINGS: Moderate esophageal dysmotility with fluid/debris in the mid/distal esophagus. Marked narrowing/stricture of the distal esophagus just above the GE junction. After significant delay, trace contrast passed into the proximal stomach. When correlating with the recent CT, there is no evidence of esophageal mass. However, a fat containing hernia at the esophageal hiatus is present, possibly leading to extrinsic compression. IMPRESSION: Severe stricture of the distal esophagus just above the GE junction. Secondary esophageal dysmotility. When correlating with recent CT, there is no evidence of esophageal mass. Possible extrinsic compression secondary to a fat containing hernia. Consider endoscopy for further  evaluation. Electronically Signed   By: Julian Hy M.D.   On: 07/11/2017 10:21   US Renal  Result Date: 07/09/2017 CLINICAL DATA:  Elevated creatinine EXAM: RENAL / URINARY TRACT ULTRASOUND COMPLETE COMPARISON:  CT 07/08/2017 FINDINGS: Right Kidney: Length: 10.6 cm. Echogenicity within normal limits. No mass or hydronephrosis visualized. Left Kidney: Length: 10.3 cm. Multiple cysts in the left kidney, the largest 4.5 cm. These appear benign. No hydronephrosis. Bladder: Appears normal for degree of bladder distention. IMPRESSION: Left renal cysts. No acute findings.  No hydronephrosis. Electronically Signed   By: Rolm Baptise M.D.   On: 07/09/2017 15:56   Dg Abd Acute W/chest  Result Date: 07/08/2017 CLINICAL DATA:  Vomiting for 10 days.  Weight loss. EXAM: DG ABDOMEN ACUTE W/ 1V CHEST COMPARISON:  Chest radiograph 01/13/2016 FINDINGS: The lungs are hyperexpanded with multiple areas of increased lucency. No focal airspace consolidation or pulmonary edema. No pneumothorax or sizable pleural effusion. No free intraperitoneal air. No dilated loops of bowel are visible. The round densities overlying the left psoas muscle may be within the transverse colon. IMPRESSION: 1. COPD without acute airspace disease. 2. No free intraperitoneal air or evidence of small-bowel obstruction. Electronically Signed   By: Ulyses Jarred M.D.   On: 07/08/2017 00:07    Assessment and Plan:   1.  Esophagus cancer  Distal esophageal stricture noted on esophagram 07/11/2017  CTs of the chest, abdomen, and pelvis 07/08/2017-right lung pneumonia, emphysema, no evidence of metastatic disease  Upper endoscopy 07/13/2017- GE junction mass, could not be passed with standard endoscope, biopsy suspicious for adenocarcinoma  2.   Solid/liquid dysphagia secondary to #1  3.   Weight loss/malnutrition  4.   COPD  5.   Heavy alcohol use  6.   Gout  7.   Hypertension  8.   Pneumonia  Mr. Hellenbrand appears to have  esophagus cancer.  The clinical presentation, endoscopy, and CT findings are consistent with a diagnosis of localized esophagus cancer.  The GE junction mass biopsy is "suspicious "for adenocarcinoma.  I discussed the probable diagnosis and treatment options with Mr. Swopes.  I explained the standard treatment course with combined chemotherapy/radiation in patients with localized esophagus cancer.  He may not  be a surgical candidate based on the advanced COPD.  He has lost a significant amount of weight and is unable to take adequate nutrition at present.  He will benefit from placement of a surgical feeding tube.  Recommendations: 1.  Placement of surgical jejunostomy feeding tube 2.  Repeat upper endoscopy/EUS for staging of the esophagus mass and a diagnostic biopsy 3.  Radiation oncology consult-I consulted Dr. Lisbeth Renshaw 4.  Check CEA 5.  Outpatient follow-up will be scheduled at the Cancer center  Please call Oncology as needed.  Betsy Coder, MD 07/14/2017, 5:24 PM

## 2017-07-14 NOTE — Progress Notes (Addendum)
     [facility] Progress Note  CC:  Esophageal cancer  Subjective:   He is trying to drink liquids, has significant difficulty getting liquids down with regurgitation.  He has no appetite. Objective:  Vital signs in last 24 hours: Temp:  [97.9 F (36.6 C)-98.6 F (37 C)] 98.6 F (37 C) (01/17 0457) Pulse Rate:  [72-86] 73 (01/17 0457) Resp:  [12-18] 18 (01/17 0457) BP: (114-146)/(73-86) 146/86 (01/17 0457) SpO2:  [94 %-99 %] 96 % (01/17 0457) Last BM Date: 07/12/17 General:   Alert,  Well-developed,    in NAD Heart:  Regular rate and rhythm; no murmurs Pulm: Abdomen:  Soft, nontender and nondistended. Normal bowel sounds, without guarding, and without rebound.   Extremities:  Without edema. Neurologic:  Alert and  oriented x4;  grossly normal neurologically. Psych:  Alert and cooperative. Normal mood and affect.  Intake/Output from previous day: 01/16 0701 - 01/17 0700 In: 1616.3 [P.O.:480; I.V.:836.3; IV Piggyback:300] Out: 750 [Urine:750]  Lab Results: BMET Recent Labs    07/12/17 0342 07/13/17 0323  NA 135 137  K 3.5 3.5  CL 91* 96*  CO2 32 31  GLUCOSE 94 74  BUN 6 5*  CREATININE 1.06 0.99  CALCIUM 8.9 8.8*   LFT Recent Labs    07/12/17 0342  PROT 6.6  ALBUMIN 3.7  AST 32  ALT 21  ALKPHOS 87  BILITOT 0.6   Assessment / Plan: *GE junction mass:  Pathology confirms adenocarcinoma.  General surgery has seen the patient and CT surgery has been consulted as well.  Patient's diet cannot be advanced as he can barely handle clear liquids.  Will likely need a J-tube prior to discharge. *Etoh abuse: reports dailyuse, at least a 6-pack per day or the equivalent of; stopped 1 month ago due to hiccups *Pneumonia: thought due to aspiration, leukocytosis resolved. This is day 7 of abx (Unasyn). *O2 dependent COPD: on 4 L at home    LOS: 6 days   Laban Emperor. Zehr  07/14/2017, 11:12 AM  Pager number [phone]   Attending physician's note   I  have taken an interval history, reviewed the chart and examined the patient. I agree with the Advanced Practitioner's note, impression and recommendations.  High-grade stricture at the EG junction secondary to circumferential mass, unable to traverse EG junction with regular upper endoscope 59mm in size, was switched to 5 mm pediatric scope and extended to duodenum.  Biopsies confirmed adenocarcinoma.  No evidence of metastatic disease based on CT chest, abdomen and pelvis CT surgery was consulted for evaluation Patient will need surgical placement of J-tube to maintain his nutritional status Continue liquid diet as tolerated We are available for any questions  K Denzil Magnuson, MD [phone] Mon-Fri 8a-5p [phone] after 5p, weekends, holidays

## 2017-07-14 NOTE — Progress Notes (Signed)
Nutrition Follow-up  DOCUMENTATION CODES:   Non-severe (moderate) malnutrition in context of chronic illness  INTERVENTION:   Will monitor for plan regarding J-tube placement  TF recommendations: Initiate Osmolite 1.5 @ 20 ml/hr via J-tube and advance by 10 ml every 12 hours to goal of 60 ml/hr.  Patient will be at risk of refeeding syndrome. Once TF begins, please monitor magnesium, potassium, and phosphorus daily for at least 3 days, MD to replete as needed.   NUTRITION DIAGNOSIS:   Moderate Malnutrition related to chronic illness, nausea, vomiting(ETOH abuse) as evidenced by percent weight loss, energy intake < or equal to 75% for > or equal to 1 month, moderate fat depletion, moderate muscle depletion.  Ongoing.  GOAL:   Patient will meet greater than or equal to 90% of their needs  Not meeting.  MONITOR:   PO intake, Weight trends, Labs, I & O's  REASON FOR ASSESSMENT:   Consult Assessment of nutrition requirement/status  ASSESSMENT:   72 y.o. male with medical history significant of COPD, CRF on 4 liters he thinks at home, htn comes in with over a week of nausea and vomiting with no abdominal pain or blood in vomit.  No fevers.  No diarrhea. No cough.  No chest pain.  He says he gets nauseated even after drinking liquids.  Pt referred for admission for report of wt loss and his n/v.  Events: 1/14: DG of esophagus shows esophageal stricture 1/16: EGD reveals malignant esophageal mass 1/17: biopsies confirm adenocarcinoma per GI note  Surgery has been consulted for J-tube placement. RD will provide TF recommendations above.  Pt currently not able to tolerate even clear liquids. Pt at risk of developing severe malnutrition. Will d/c supplement orders at this time.  Labs reviewed. Medications: Multivitamin with minerals daily  Diet Order:  Diet clear liquid Room service appropriate? Yes; Fluid consistency: Thin  EDUCATION NEEDS:   Education needs have been  addressed  Skin:  Skin Assessment: Reviewed RN Assessment  Last BM:  1/15  Height:   Ht Readings from Last 1 Encounters:  07/13/17 5' 7.25" (1.708 m)    Weight:   Wt Readings from Last 1 Encounters:  07/13/17 120 lb (54.4 kg)    Ideal Body Weight:  67.3 kg  BMI:  Body mass index is 18.66 kg/m.  Estimated Nutritional Needs:   Kcal:  1900-2100  Protein:  90-100g  Fluid:  2L/day  [name], MS, RD, LDN [place] [facility] Pager: [phone] After Hours Pager: [phone]

## 2017-07-14 NOTE — Care Management Important Message (Signed)
Important Message  Patient Details  Name: Lisle Skillman MRN: [id number] Date of Birth: [birth year]   Medicare Important Message Given:  Yes    Kerin Salen 07/14/2017, 11:25 AMImportant Message  Patient Details  Name: Gordy Goar MRN: [id number] Date of Birth: 1946-01-01   Medicare Important Message Given:  Yes    Kerin Salen 07/14/2017, 11:24 AM

## 2017-07-14 NOTE — Progress Notes (Signed)
PHARMACY NOTE:  Pharmacy has been assisting with dosing of Unasyn for aspiration pneumonia. Dosage remains stable at 1.5g IV q6h and need for further dosage adjustment appears unlikely at present.    Will sign off at this time.  Please reconsult if a change in clinical status warrants re-evaluation of dosage.   Lindell Spar, PharmD, BCPS Pager: [phone] 07/14/2017 8:51 AM

## 2017-07-15 ENCOUNTER — Encounter (HOSPITAL_COMMUNITY): Payer: Self-pay | Admitting: Registered Nurse

## 2017-07-15 ENCOUNTER — Inpatient Hospital Stay (HOSPITAL_COMMUNITY): Payer: Medicare Other | Admitting: Anesthesiology

## 2017-07-15 ENCOUNTER — Other Ambulatory Visit: Payer: Self-pay

## 2017-07-15 ENCOUNTER — Encounter (HOSPITAL_COMMUNITY): Admission: EM | Disposition: A | Discharge status: Skilled Nursing Facility | Payer: Self-pay | Source: Home / Self Care

## 2017-07-15 HISTORY — PX: LAPAROTOMY: SHX154

## 2017-07-15 LAB — CBC
HEMATOCRIT: 46.2 % (ref 39.0–52.0)
HEMOGLOBIN: 15.7 g/dL (ref 13.0–17.0)
MCH: 30.4 pg (ref 26.0–34.0)
MCHC: 34 g/dL (ref 30.0–36.0)
MCV: 89.4 fL (ref 78.0–100.0)
Platelets: 246 10*3/uL (ref 150–400)
RBC: 5.17 MIL/uL (ref 4.22–5.81)
RDW: 14.6 % (ref 11.5–15.5)
WBC: 23.4 10*3/uL — ABNORMAL HIGH (ref 4.0–10.5)

## 2017-07-15 LAB — CREATININE, SERUM: CREATININE: 1 mg/dL (ref 0.61–1.24)

## 2017-07-15 SURGERY — LAPAROTOMY, EXPLORATORY
Anesthesia: General | Site: Abdomen

## 2017-07-15 MED ORDER — FENTANYL CITRATE (PF) 250 MCG/5ML IJ SOLN
INTRAMUSCULAR | Status: AC
  Filled 2017-07-15: qty 5

## 2017-07-15 MED ORDER — ONDANSETRON HCL 4 MG/2ML IJ SOLN
INTRAMUSCULAR | Status: DC | PRN
  Administered 2017-07-15: 4 mg via INTRAVENOUS

## 2017-07-15 MED ORDER — 0.9 % SODIUM CHLORIDE (POUR BTL) OPTIME
TOPICAL | Status: DC | PRN
  Administered 2017-07-15: 1000 mL

## 2017-07-15 MED ORDER — LIDOCAINE 2% (20 MG/ML) 5 ML SYRINGE
INTRAMUSCULAR | Status: DC | PRN
  Administered 2017-07-15: 60 mg via INTRAVENOUS

## 2017-07-15 MED ORDER — METHOCARBAMOL 500 MG PO TABS: 500.0000 mg | ORAL_TABLET | Freq: Four times a day (QID) | ORAL | Status: DC | PRN

## 2017-07-15 MED ORDER — LIDOCAINE 2% (20 MG/ML) 5 ML SYRINGE
INTRAMUSCULAR | Status: AC
  Filled 2017-07-15: qty 5

## 2017-07-15 MED ORDER — BUPIVACAINE-EPINEPHRINE (PF) 0.5% -1:200000 IJ SOLN
INTRAMUSCULAR | Status: AC
  Filled 2017-07-15: qty 30

## 2017-07-15 MED ORDER — SODIUM CHLORIDE 0.9 % IJ SOLN
INTRAMUSCULAR | Status: DC | PRN
  Administered 2017-07-15: 20 mL

## 2017-07-15 MED ORDER — PANTOPRAZOLE SODIUM 40 MG IV SOLR: 40.0000 mg | Freq: Every day | INTRAVENOUS | Status: DC

## 2017-07-15 MED ORDER — FENTANYL CITRATE (PF) 100 MCG/2ML IJ SOLN
INTRAMUSCULAR | Status: AC
  Filled 2017-07-15: qty 2

## 2017-07-15 MED ORDER — PHENYLEPHRINE 40 MCG/ML (10ML) SYRINGE FOR IV PUSH (FOR BLOOD PRESSURE SUPPORT)
PREFILLED_SYRINGE | INTRAVENOUS | Status: DC | PRN
  Administered 2017-07-15: 160 ug via INTRAVENOUS

## 2017-07-15 MED ORDER — LACTATED RINGERS IV SOLN
INTRAVENOUS | Status: DC
Start: 2017-07-15 — End: 2017-07-15
  Administered 2017-07-15 (×2): via INTRAVENOUS

## 2017-07-15 MED ORDER — MEPERIDINE HCL 50 MG/ML IJ SOLN: 6.2500 mg | INTRAMUSCULAR | Status: DC | PRN

## 2017-07-15 MED ORDER — SUGAMMADEX SODIUM 200 MG/2ML IV SOLN
INTRAVENOUS | Status: AC
  Filled 2017-07-15: qty 4

## 2017-07-15 MED ORDER — ONDANSETRON 4 MG PO TBDP: 4.0000 mg | ORAL_TABLET | Freq: Four times a day (QID) | ORAL | Status: DC | PRN

## 2017-07-15 MED ORDER — EPHEDRINE 5 MG/ML INJ
INTRAVENOUS | Status: AC
  Filled 2017-07-15: qty 10

## 2017-07-15 MED ORDER — FENTANYL CITRATE (PF) 100 MCG/2ML IJ SOLN: 25.0000 ug | INTRAMUSCULAR | Status: DC | PRN

## 2017-07-15 MED ORDER — POTASSIUM CHLORIDE IN NACL 20-0.9 MEQ/L-% IV SOLN
INTRAVENOUS | Status: AC
  Administered 2017-07-15: 20:00:00 via INTRAVENOUS
  Filled 2017-07-15 (×3): qty 1000

## 2017-07-15 MED ORDER — FENTANYL CITRATE (PF) 250 MCG/5ML IJ SOLN
INTRAMUSCULAR | Status: DC | PRN
  Administered 2017-07-15: 100 ug via INTRAVENOUS
  Administered 2017-07-15: 50 ug via INTRAVENOUS

## 2017-07-15 MED ORDER — SODIUM CHLORIDE 0.9 % IJ SOLN
INTRAMUSCULAR | Status: AC
  Filled 2017-07-15: qty 50

## 2017-07-15 MED ORDER — CEFAZOLIN SODIUM-DEXTROSE 2-4 GM/100ML-% IV SOLN
2.0000 g | Freq: Three times a day (TID) | INTRAVENOUS | Status: AC
  Administered 2017-07-16: 2 g via INTRAVENOUS
  Filled 2017-07-15: qty 100

## 2017-07-15 MED ORDER — ROCURONIUM BROMIDE 50 MG/5ML IV SOSY
PREFILLED_SYRINGE | INTRAVENOUS | Status: AC
  Filled 2017-07-15: qty 5

## 2017-07-15 MED ORDER — DEXAMETHASONE SODIUM PHOSPHATE 10 MG/ML IJ SOLN
INTRAMUSCULAR | Status: AC
  Filled 2017-07-15: qty 1

## 2017-07-15 MED ORDER — SODIUM CHLORIDE 0.9 % IV SOLN
INTRAVENOUS | Status: DC
  Filled 2017-07-15 (×2): qty 1000

## 2017-07-15 MED ORDER — DEXAMETHASONE SODIUM PHOSPHATE 10 MG/ML IJ SOLN
INTRAMUSCULAR | Status: DC | PRN
  Administered 2017-07-15: 10 mg via INTRAVENOUS

## 2017-07-15 MED ORDER — ROCURONIUM BROMIDE 10 MG/ML (PF) SYRINGE
PREFILLED_SYRINGE | INTRAVENOUS | Status: DC | PRN
  Administered 2017-07-15: 30 mg via INTRAVENOUS

## 2017-07-15 MED ORDER — LIP MEDEX EX OINT
TOPICAL_OINTMENT | CUTANEOUS | Status: AC
  Filled 2017-07-15: qty 7

## 2017-07-15 MED ORDER — AMOXICILLIN-POT CLAVULANATE 250-62.5 MG/5ML PO SUSR
500.0000 mg | Freq: Three times a day (TID) | ORAL | Status: AC
  Administered 2017-07-15 – 2017-07-19 (×11): 500 mg via ORAL
  Filled 2017-07-15 (×12): qty 10

## 2017-07-15 MED ORDER — IPRATROPIUM-ALBUTEROL 0.5-2.5 (3) MG/3ML IN SOLN
3.0000 mL | Freq: Two times a day (BID) | RESPIRATORY_TRACT | Status: DC
  Administered 2017-07-15 – 2017-07-20 (×8): 3 mL via RESPIRATORY_TRACT
  Filled 2017-07-15 (×9): qty 3

## 2017-07-15 MED ORDER — EPHEDRINE SULFATE-NACL 50-0.9 MG/10ML-% IV SOSY
PREFILLED_SYRINGE | INTRAVENOUS | Status: DC | PRN
  Administered 2017-07-15: 10 mg via INTRAVENOUS

## 2017-07-15 MED ORDER — LACTATED RINGERS IV SOLN
INTRAVENOUS | Status: DC
  Administered 2017-07-17 – 2017-07-22 (×7): via INTRAVENOUS

## 2017-07-15 MED ORDER — SUGAMMADEX SODIUM 200 MG/2ML IV SOLN
INTRAVENOUS | Status: DC | PRN
  Administered 2017-07-15: 200 mg via INTRAVENOUS

## 2017-07-15 MED ORDER — FENTANYL CITRATE (PF) 100 MCG/2ML IJ SOLN
25.0000 ug | INTRAMUSCULAR | Status: DC | PRN
  Administered 2017-07-15 (×2): 50 ug via INTRAVENOUS

## 2017-07-15 MED ORDER — ENOXAPARIN SODIUM 40 MG/0.4ML ~~LOC~~ SOLN
40.0000 mg | SUBCUTANEOUS | Status: DC
  Administered 2017-07-16 – 2017-07-19 (×4): 40 mg via SUBCUTANEOUS
  Filled 2017-07-15 (×4): qty 0.4

## 2017-07-15 MED ORDER — ONDANSETRON HCL 4 MG/2ML IJ SOLN
INTRAMUSCULAR | Status: AC
  Filled 2017-07-15: qty 2

## 2017-07-15 MED ORDER — SODIUM CHLORIDE 0.9 % IV SOLN: INTRAVENOUS | Status: DC

## 2017-07-15 MED ORDER — PHENYLEPHRINE 40 MCG/ML (10ML) SYRINGE FOR IV PUSH (FOR BLOOD PRESSURE SUPPORT)
PREFILLED_SYRINGE | INTRAVENOUS | Status: AC
  Filled 2017-07-15: qty 10

## 2017-07-15 MED ORDER — ONDANSETRON HCL 4 MG/2ML IJ SOLN
4.0000 mg | Freq: Four times a day (QID) | INTRAMUSCULAR | Status: DC | PRN
  Administered 2017-07-15 – 2017-07-30 (×7): 4 mg via INTRAVENOUS
  Filled 2017-07-15 (×8): qty 2

## 2017-07-15 MED ORDER — PROPOFOL 10 MG/ML IV BOLUS
INTRAVENOUS | Status: DC | PRN
  Administered 2017-07-15: 200 mg via INTRAVENOUS

## 2017-07-15 MED ORDER — HYDROCODONE-ACETAMINOPHEN 5-325 MG PO TABS: 1.0000 | ORAL_TABLET | ORAL | Status: DC | PRN

## 2017-07-15 MED ORDER — BUPIVACAINE LIPOSOME 1.3 % IJ SUSP
20.0000 mL | Freq: Once | INTRAMUSCULAR | Status: AC
  Administered 2017-07-15: 20 mL
  Filled 2017-07-15: qty 20

## 2017-07-15 MED ORDER — HYDROMORPHONE HCL 1 MG/ML IJ SOLN
1.0000 mg | INTRAMUSCULAR | Status: DC | PRN
  Administered 2017-07-15 – 2017-07-18 (×7): 1 mg via INTRAVENOUS
  Filled 2017-07-15 (×7): qty 1

## 2017-07-15 SURGICAL SUPPLY — 42 items
APPLICATOR COTTON TIP 6IN STRL (MISCELLANEOUS) ×3 IMPLANT
BLADE EXTENDED COATED 6.5IN (ELECTRODE) IMPLANT
BLADE HEX COATED 2.75 (ELECTRODE) ×1 IMPLANT
CATH ROBINSON RED A/P 16FR (CATHETERS) ×2 IMPLANT
COVER MAYO STAND STRL (DRAPES) IMPLANT
COVER SURGICAL LIGHT HANDLE (MISCELLANEOUS) ×3 IMPLANT
DECANTER SPIKE VIAL GLASS SM (MISCELLANEOUS) ×2 IMPLANT
DRAPE LAPAROSCOPIC ABDOMINAL (DRAPES) ×1 IMPLANT
DRAPE WARM FLUID 44X44 (DRAPE) IMPLANT
ELECT REM PT RETURN 15FT ADLT (MISCELLANEOUS) ×3 IMPLANT
GAUZE SPONGE 4X4 12PLY STRL (GAUZE/BANDAGES/DRESSINGS) ×3 IMPLANT
GLOVE BIOGEL PI IND STRL 7.0 (GLOVE) ×1 IMPLANT
GLOVE BIOGEL PI INDICATOR 7.0 (GLOVE)
GLOVE EUDERMIC 7 POWDERFREE (GLOVE) ×3 IMPLANT
GOWN STRL REUS W/TWL LRG LVL3 (GOWN DISPOSABLE) ×1 IMPLANT
GOWN STRL REUS W/TWL XL LVL3 (GOWN DISPOSABLE) ×4 IMPLANT
HANDLE SUCTION POOLE (INSTRUMENTS) IMPLANT
KIT BASIN OR (CUSTOM PROCEDURE TRAY) ×3 IMPLANT
NEEDLE HYPO 22GX1.5 SAFETY (NEEDLE) ×2 IMPLANT
NS IRRIG 1000ML POUR BTL (IV SOLUTION) ×1 IMPLANT
PACK GENERAL/GYN (CUSTOM PROCEDURE TRAY) ×3 IMPLANT
PLUG CATH AND CAP STER (CATHETERS) ×2 IMPLANT
SPONGE DRAIN TRACH 4X4 STRL 2S (GAUZE/BANDAGES/DRESSINGS) ×2 IMPLANT
SPONGE LAP 18X18 X RAY DECT (DISPOSABLE) IMPLANT
STAPLER VISISTAT 35W (STAPLE) ×1 IMPLANT
SUCTION POOLE HANDLE (INSTRUMENTS)
SUT PDS AB 1 CTX 36 (SUTURE) ×4 IMPLANT
SUT PDS AB 1 TP1 96 (SUTURE) ×6 IMPLANT
SUT SILK 2 0 (SUTURE) ×3
SUT SILK 2 0 SH CR/8 (SUTURE) ×2 IMPLANT
SUT SILK 2-0 18XBRD TIE 12 (SUTURE) IMPLANT
SUT SILK 3 0 (SUTURE)
SUT SILK 3 0 SH CR/8 (SUTURE) ×2 IMPLANT
SUT SILK 3-0 18XBRD TIE 12 (SUTURE) IMPLANT
SUT VIC AB 3-0 SH 18 (SUTURE) ×2 IMPLANT
SUT VICRYL 2 0 18  UND BR (SUTURE)
SUT VICRYL 2 0 18 UND BR (SUTURE) IMPLANT
SYRINGE 20CC LL (MISCELLANEOUS) ×2 IMPLANT
TAPE CLOTH SURG 4X10 WHT LF (GAUZE/BANDAGES/DRESSINGS) ×2 IMPLANT
TOWEL OR 17X26 10 PK STRL BLUE (TOWEL DISPOSABLE) ×4 IMPLANT
TRAY FOLEY W/METER SILVER 16FR (SET/KITS/TRAYS/PACK) IMPLANT
YANKAUER SUCT BULB TIP NO VENT (SUCTIONS) IMPLANT

## 2017-07-15 NOTE — Progress Notes (Signed)
2 Days Post-Op    CC:  Esophageal mass/stenosis/obstruction   Subjective: He is agreeable to the jejunostomy feeding tube.  So we will go ahead and plan to do that today.  Objective: Vital signs in last 24 hours: Temp:  [98 F (36.7 C)-98.7 F (37.1 C)] 98.7 F (37.1 C) (01/18 0540) Pulse Rate:  [81-91] 89 (01/18 0540) Resp:  [16] 16 (01/18 0540) BP: (137-160)/(84-92) 160/91 (01/18 0540) SpO2:  [95 %-97 %] 96 % (01/18 0540) Last BM Date: 07/12/17  Intake/Output from previous day: 01/17 0701 - 01/18 0700 In: 360 [P.O.:360] Out: 875 [Urine:875] Intake/Output this shift: No intake/output data recorded.  General appearance: alert, cooperative and no distress Resp: clear to auscultation bilaterally GI: soft, non-tender; bowel sounds normal; no masses,  no organomegaly  Lab Results:  No results for input(s): WBC, HGB, HCT, PLT in the last 72 hours.  BMET Recent Labs    07/13/17 0323  NA 137  K 3.5  CL 96*  CO2 31  GLUCOSE 74  BUN 5*  CREATININE 0.99  CALCIUM 8.8*   PT/INR No results for input(s): LABPROT, INR in the last 72 hours.  Recent Labs  Lab 07/10/17 0349 07/12/17 0342  AST 24 32  ALT 16* 21  ALKPHOS 62 87  BILITOT 1.0 0.6  PROT 5.2* 6.6  ALBUMIN 2.9* 3.7     Lipase     Component Value Date/Time   LIPASE 39 07/12/2017 0342     Medications: . guaiFENesin  600 mg Oral BID  . ipratropium-albuterol  3 mL Nebulization TID  . mometasone-formoterol  2 puff Inhalation BID  . multivitamin with minerals  1 tablet Oral Daily  . 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 Esophageal stenosis -admitted 1/11 with intractable n/v - DG esophagus 1/14 showed severe stricture of the distal esophagus just above the GE junction, secondary esophageal dysmotility - upper endoscopy 1/16 showed malignant-appearing esophageal stenosis,gastric tumor at the gastroesophageal  junction, normal examined duodenum - biopsy pending   COPD/emphysema oxygen dependent - 4 L nasal cannula Community-acquired pneumonia right middle lobe and right lower lobe- onUnasyn HTN Alcohol abuse -6 pack/day H/o gout Malnutrition-prealbumin 11.6 Body mass index is 18.6  FEN: IV fluids/clear liquids ID: Azithromycin & Rocephin 11/1 -05/11/18; Unasyn 11/14 =>> day 4 DVT: SCDs only Foley: None Follow-up: To be determined  Plan:  Feeding tube in OR today        LOS: 7 days    George Stokes 07/15/2017 [phone]

## 2017-07-15 NOTE — Transfer of Care (Signed)
Immediate Anesthesia Transfer of Care Note  Patient: George Stokes  Procedure(s) Performed: FEEDING JEJUNOSTOMY TUBE PLACEMENT (N/A Abdomen)  Patient Location:  PACU  Anesthesia Type:General  Level of Consciousness: sedated  Airway & Oxygen Therapy: Patient Spontanous Breathing and Patient connected to face mask oxygen  Post-op Assessment: Report given to RN and Post -op Vital signs reviewed and stable  Post vital signs: Reviewed and stable  Last Vitals:  Vitals:   07/15/17 1422 07/15/17 1557  BP: 131/86   Pulse: 75 77  Resp: 18 16  Temp: 36.6 C   SpO2: 94% 95%    Last Pain:  Vitals:   07/15/17 1557  TempSrc:   PainSc: 0-No pain         Complications: No apparent anesthesia complications

## 2017-07-15 NOTE — Anesthesia Procedure Notes (Signed)
Procedure Name: Intubation Date/Time: 07/15/2017 4:26 PM Performed by: Talbot Grumbling, CRNA Pre-anesthesia Checklist: Patient identified, Emergency Drugs available, Suction available and Patient being monitored Patient Re-evaluated:Patient Re-evaluated prior to induction Oxygen Delivery Method: Circle system utilized Preoxygenation: Pre-oxygenation with 100% oxygen Induction Type: IV induction Ventilation: Mask ventilation without difficulty Laryngoscope Size: Miller and 2 Grade View: Grade III Tube type: Oral Tube size: 8.0 mm Number of attempts: 1 Airway Equipment and Method: Stylet Placement Confirmation: ETT inserted through vocal cords under direct vision,  positive ETCO2 and breath sounds checked- equal and bilateral Secured at: 22 cm Tube secured with: Tape Dental Injury: Teeth and Oropharynx as per pre-operative assessment

## 2017-07-15 NOTE — Op Note (Signed)
Patient Name:           George Stokes   Date of Surgery:        07/15/2017  Pre op Diagnosis:      Protein calorie malnutrition, moderately severe                                       Distal esophageal cancer with stricture  Post op Diagnosis:    Same  Procedure:                 Feeding jejunostomy (16 french red rubber)  Surgeon:                     Edsel Petrin. Dalbert Batman, M.D., FACS  Assistant:                      OR staff  Operative Indications:   This is a 72 year old man with COPD on home O2.  Recent onset of dysphagia to both solids and some liquids and a 20 pound weight loss over the last month but probably 30 pounds over the past 2 months.  Because of progressive dysphagia he underwent endoscopy which showed a large partially obstructing mass just above the GE junction traversed with the pediatric scope.  Pathology looks like adenocarcinoma.  CT surgery is aware of the case.  The patient has seen Dr. Julieanne Manson.  Feeding jejunostomy his requested.  It is not clear whether he will ever become a candidate for esophagectomy but nevertheless needs nutritional support for radiotherapy and chemotherapy  Operative Findings:       The small bowel looked normal.  There were a couple of adhesions.  I had to run the small bowel twice to be sure of  the anatomy but ultimately identified the ileocecal valve and proximately  the ligament of Treitz.  I used a 16 French red rubber catheter with 4 extra holes cut in the size of the tip cut off.  A Witzel tunnel was created with Vicryl.  The catheter flushed easily at the end of the case.  The catheter enters the jejunum about 10 inches distal to the ligament of Treitz and was directed distally.  Procedure in Detail:          Following the induction of general endotracheal anesthesia the patient's abdomen was prepped and draped in a sterile fashion.  Intravenous antibiotics were given.  Surgical timeout was performed.  Exparel was used as a local infiltration  anesthetic.    A short upper midline incision was made.  The fascia was divided and the abdomen entered uneventfully.  Transverse colon was retracted cephalad.  The small bowel was identified and after careful examination of the anatomy and clear identification of the ligament of Treitz I picked a spot on the small bowel wall about 10-12 inches distal to ligament of Treitz to the tube insertion to allow no tension.  Iberia catheter was brought to the field.  The tip is cut off.  I cut 4 extra sideholes.  This was passed through a small incision in the left upper quadrant of the abdominal wall.  Purstring suture of 3-0 Vicryl was placed in the antimesenteric wall of the proximal jejunum.  An enterotomy was made.  The catheter was threaded into the jejunum and probably about 12-14 inches were inserted distally.  The Purstring  suture was tied.  A Witzel tunnel was created with interrupted sutures of 3-0 Vicryl both  proximal and distal to the tube insertion to ensure good security.  The catheter flushed easily.  The jejunum was then tacked to the abdominal wall with multiple interrupted sutures of 3-0 silk to make sure that it was secure and would not twist in any way.  The retrograde catheter was sutured to the skin with a 2-0 silk suture.  The midline fascia was closed with a running suture of #1 PDS and skin closed with skin staples.  Clean bandages were placed.  The patient tolerated the procedure well was taken to PACU in stable condition.  EBL 25 mL.  Counts correct.  Complications none.     Edsel Petrin. Dalbert Batman, M.D., FACS General and Minimally Invasive Surgery Breast and Colorectal Surgery  07/15/2017 5:21 PM

## 2017-07-15 NOTE — Anesthesia Preprocedure Evaluation (Signed)
Anesthesia Evaluation  Patient identified by MRN, date of birth, ID band Patient awake    Reviewed: Allergy & Precautions, NPO status , Patient's Chart, lab work & pertinent test results  Airway Mallampati: II  TM Distance: >3 FB Neck ROM: Full    Dental no notable dental hx.    Pulmonary neg pulmonary ROS, COPD,  COPD inhaler and oxygen dependent, former smoker,    Pulmonary exam normal breath sounds clear to auscultation       Cardiovascular hypertension, Pt. on medications negative cardio ROS Normal cardiovascular exam Rhythm:Regular Rate:Normal     Neuro/Psych negative neurological ROS  negative psych ROS   GI/Hepatic negative GI ROS, Neg liver ROS,   Endo/Other  negative endocrine ROS  Renal/GU Renal diseasenegative Renal ROS  negative genitourinary   Musculoskeletal negative musculoskeletal ROS (+) Arthritis , Osteoarthritis,    Abdominal   Peds negative pediatric ROS (+)  Hematology negative hematology ROS (+)   Anesthesia Other Findings   Reproductive/Obstetrics negative OB ROS                             Anesthesia Physical  Anesthesia Plan  ASA: III  Anesthesia Plan: General   Post-op Pain Management:    Induction: Intravenous  PONV Risk Score and Plan: 1 and Treatment may vary due to age or medical condition and Ondansetron  Airway Management Planned: LMA and Oral ETT  Additional Equipment:   Intra-op Plan:   Post-operative Plan: Extubation in OR  Informed Consent: I have reviewed the patients History and Physical, chart, labs and discussed the procedure including the risks, benefits and alternatives for the proposed anesthesia with the patient or authorized representative who has indicated his/her understanding and acceptance.     Plan Discussed with: CRNA and Anesthesiologist  Anesthesia Plan Comments: (  )       Anesthesia Quick Evaluation

## 2017-07-15 NOTE — Progress Notes (Addendum)
[facility] PROGRESS NOTE    Progress Note  George Stokes  EXB:[id number] DOB: 05/24/1946 DOA: 07/07/2017 PCP: Patient, No Pcp Per     Brief Narrative:   George Stokes is an 72 y.o. male past medical history significant for COPD on home oxygen comes for 1 week of nausea vomiting he denies any abdominal pain or hematemesis no diarrhea.  Assessment/Plan:   Intractable nausea and vomiting due to esophageal stenosis: CT scan of the abdomen pelvis showed no acute findings. GI was consulted recommended an EGD performed on 07/13/2016 that showed malignant appearing esophageal stenosis biopsies were done, show atypical cells suspicious for adenocarcinoma. Discussed with oncology and they recommended an EUSspoke with GI, they relate this is not endoscopically resectable. Radiation oncology was consulted and awaiting recommendation Consulted surgery for J-tube placement for nutritional needs.  Also consulted nutrition for enteral feedings.  Likely aspiration pneumonia: From intractable nausea and vomiting Change Unasyn to liquid Augmentin. Lung exams are clear has remained afebrile.  Acute kidney injury: Likely prerenal in etiology resolved with IV fluid hydration.  Hypokalemia/hypomagnesemia: Repleted orally now stable.  Bilateral renal hypodense lesions seen on CT: [place] confirmed renal cyst  COPD oxygen dependent: No wheezing stable  History of gout:  Alcohol use: No signs of withdrawal.  Malnutrition of moderate degree Likely due to alcohol abuse in the setting of esophageal stenosis.  Ensure 3 times daily. Nutritional consult.   DVT prophylaxis: lovenox Family Communication:none Disposition Plan/Barrier to D/C: unable to determine Code Status:     Code Status Orders  (From admission, onward)        Start     Ordered   07/08/17 0955  Full code  Continuous     07/08/17 0954    Code Status History    Date Active Date Inactive Code Status Order ID Comments User  Context   This patient has a current code status but no historical code status.        IV Access:    Peripheral IV   Procedures and diagnostic studies:   No results found.   Medical Consultants:    None.  Anti-Infectives:   None  Subjective:    George Stokes no further complaints, has no pain.  Objective:    Vitals:   07/14/17 1427 07/14/17 2042 07/14/17 2139 07/15/17 0540  BP: (!) 154/92  137/84 (!) 160/91  Pulse: 81  91 89  Resp: 16  16 16   Temp: 98 F (36.7 C)  98.3 F (36.8 C) 98.7 F (37.1 C)  TempSrc: Oral  Oral Oral  SpO2:  97% 95% 96%  Weight:      Height:        Intake/Output Summary (Last 24 hours) at 07/15/2017 0927 Last data filed at 07/15/2017 0540 Gross per 24 hour  Intake 360 ml  Output 875 ml  Net -515 ml   Filed Weights   07/07/17 1543 07/13/17 0922  Weight: 54.4 kg (120 lb) 54.4 kg (120 lb)    Exam: General exam: In no acute distress, cachectic appearing. Respiratory system: Good air movement and clear to auscultation. Cardiovascular system: S1 & S2 heard, RRR.  Gastrointestinal system: Abdomen is nondistended, soft and nontender.  Central nervous system: Alert and oriented. No focal neurological deficits. Extremities: No pedal edema. Skin: No rashes, lesions or ulcers Psychiatry: Judgement and insight appear normal. Mood & affect appropriate.    Data Reviewed:    Labs: Basic Metabolic Panel: Recent Labs  Lab 07/09/17 0351 07/10/17 0349 07/11/17 0406  07/12/17 0342 07/13/17 0323  NA 141 138 134* 135 137  K 3.7 3.8 3.2* 3.5 3.5  CL 101 99* 93* 91* 96*  CO2 27 31 32 32 31  GLUCOSE 59* 99 107* 94 74  BUN 26* 12 6 6  5*  CREATININE 1.19 1.01 0.91 1.06 0.99  CALCIUM 8.8* 8.7* 8.8* 8.9 8.8*  MG  --  1.2* 1.6* 2.2  --    GFR Estimated Creatinine Clearance: 54.2 mL/min (by C-G formula based on SCr of 0.99 mg/dL). Liver Function Tests: Recent Labs  Lab 07/10/17 0349 07/12/17 0342  AST 24 32  ALT 16* 21    ALKPHOS 62 87  BILITOT 1.0 0.6  PROT 5.2* 6.6  ALBUMIN 2.9* 3.7   Recent Labs  Lab 07/09/17 0347 07/11/17 0406 07/12/17 0342  LIPASE 53* 49 39   No results for input(s): AMMONIA in the last 168 hours. Coagulation profile Recent Labs  Lab 07/11/17 0406  INR 1.06    CBC: Recent Labs  Lab 07/09/17 0351 07/10/17 0349  WBC 8.7 8.5  NEUTROABS 6.9 6.5  HGB 14.2 13.8  HCT 42.0 41.9  MCV 88.8 89.3  PLT 178 190   Cardiac Enzymes: No results for input(s): CKTOTAL, CKMB, CKMBINDEX, TROPONINI in the last 168 hours. BNP (last 3 results) No results for input(s): PROBNP in the last 8760 hours. CBG: No results for input(s): GLUCAP in the last 168 hours. D-Dimer: No results for input(s): DDIMER in the last 72 hours. Hgb A1c: No results for input(s): HGBA1C in the last 72 hours. Lipid Profile: No results for input(s): CHOL, HDL, LDLCALC, TRIG, CHOLHDL, LDLDIRECT in the last 72 hours. Thyroid function studies: No results for input(s): TSH, T4TOTAL, T3FREE, THYROIDAB in the last 72 hours.  Invalid input(s): FREET3 Anemia work up: No results for input(s): VITAMINB12, FOLATE, FERRITIN, TIBC, IRON, RETICCTPCT in the last 72 hours. Sepsis Labs: Recent Labs  Lab 07/09/17 0351 07/10/17 0349  WBC 8.7 8.5   Microbiology Recent Results (from the past 240 hour(s))  Culture, blood (routine x 2) Call MD if unable to obtain prior to antibiotics being given     Status: None   Collection Time: 07/08/17 10:41 AM  Result Value Ref Range Status   Specimen Description BLOOD LEFT ANTECUBITAL  Final   Special Requests   Final    BOTTLES DRAWN AEROBIC AND ANAEROBIC Blood Culture adequate volume   Culture   Final    NO GROWTH 5 DAYS Performed at [facility], [address], [place], [place] [address]    Report Status 07/13/2017 FINAL  Final  Culture, blood (routine x 2) Call MD if unable to obtain prior to antibiotics being given     Status: None   Collection Time: 07/08/17 11:20  AM  Result Value Ref Range Status   Specimen Description BLOOD RIGHT FOREARM  Final   Special Requests   Final    BOTTLES DRAWN AEROBIC AND ANAEROBIC Blood Culture adequate volume   Culture   Final    NO GROWTH 5 DAYS Performed at [facility], [address] [place], [place] [address]    Report Status 07/13/2017 FINAL  Final     Medications:   . guaiFENesin  600 mg Oral BID  . ipratropium-albuterol  3 mL Nebulization TID  . mometasone-formoterol  2 puff Inhalation BID  . multivitamin with minerals  1 tablet Oral Daily  . pantoprazole (PROTONIX) IV  40 mg Intravenous Q12H   Continuous Infusions: . ampicillin-sulbactam (  UNASYN) IV Stopped (07/15/17 0528)  .  ceFAZolin (ANCEF) IV    . lactated ringers 1,000 mL with potassium chloride 20 mEq infusion 100 mL/hr at 07/15/17 0526     LOS: 7 days   Marinda Elk  [facility] Pager [phone]  *Please refer to [email or web address], password [email or web address] to get updated schedule on who will round on this patient, as hospitalists switch teams weekly. If 7PM-7AM, please contact night-coverage at [email or web address], password [email or web address] for any overnight needs.  07/15/2017, 9:27 AM

## 2017-07-15 NOTE — Discharge Instructions (Addendum)
1)Discharge to [facility] 2)Patient needs chemotherapy once a week for 5 weeks first dose 08/01/2017, next dose 08/10/2017 3)CBC and BMP every Monday 4)Daily Radiation Therapy every Monday through Friday starting 08/01/2017 for 5 weeks 5)Osmolyte 1.5 tube feed via J-tube at 60 mL an hour continuously, water flushes 50 mL every 4 hours via G-tube 6) dietitian/nutritionist consult at rehab facility  Radiation Therapy Monday thru Friday for 5 weeks (started 08/11/17) Weekly ChemoRx for 5 weeks (first dose 08/01/17, next dose 08/10/17)    [facility], [place] [phone]  OPEN ABDOMINAL SURGERY: POST OP INSTRUCTIONS  Always review your discharge instruction sheet given to you by the facility where your surgery was performed.  IF YOU HAVE DISABILITY OR FAMILY LEAVE FORMS, YOU MUST BRING THEM TO THE OFFICE FOR PROCESSING.  PLEASE DO NOT GIVE THEM TO YOUR DOCTOR.  1. A prescription for pain medication may be given to you upon discharge.  Take your pain medication as prescribed, if needed.  If narcotic pain medicine is not needed, then you may take acetaminophen (Tylenol) or ibuprofen (Advil) as needed. 2. Take your usually prescribed medications unless otherwise directed. 3. If you need a refill on your pain medication, please contact your pharmacy. They will contact our office to request authorization.  Prescriptions will not be filled after 5pm or on week-ends. 4. You should follow a light diet the first few days after arrival home, such as soup and crackers, pudding, etc.unless your doctor has advised otherwise. A high-fiber, low fat diet can be resumed as tolerated.   Be sure to include lots of fluids daily. Most patients will experience some swelling and bruising on the chest and neck area.  Ice packs will help.  Swelling and bruising can take several days to resolve 5. Most patients will experience some swelling and bruising in the area of the incision. Ice pack will  help. Swelling and bruising can take several days to resolve..  6. It is common to experience some constipation if taking pain medication after surgery.  Increasing fluid intake and taking a stool softener will usually help or prevent this problem from occurring.  A mild laxative (Milk of Magnesia or Miralax) should be taken according to package directions if there are no bowel movements after 48 hours. 7.  You may have steri-strips (small skin tapes) in place directly over the incision.  These strips should be left on the skin for 7-10 days.  If your surgeon used skin glue on the incision, you may shower in 24 hours.  The glue will flake off over the next 2-3 weeks.  Any sutures or staples will be removed at the office during your follow-up visit. You may find that a light gauze bandage over your incision may keep your staples from being rubbed or pulled. You may shower and replace the bandage daily. 8. ACTIVITIES:  You may resume regular (light) daily activities beginning the next day--such as daily self-care, walking, climbing stairs--gradually increasing activities as tolerated.  You may have sexual intercourse when it is comfortable.  Refrain from any heavy lifting or straining until approved by your doctor. a. You may drive when you no longer are taking prescription pain medication, you can comfortably wear a seatbelt, and you can safely maneuver your car and apply brakes b. Return to Work: ___________________________________ 2. You should see your doctor in the office for a follow-up appointment approximately two weeks after your surgery.  Make sure that you  call for this appointment within a day or two after you arrive home to insure a convenient appointment time. OTHER INSTRUCTIONS:  _____________________________________________________________ _____________________________________________________________  WHEN TO CALL YOUR DOCTOR: 1. Fever over 101.0 2. Inability to urinate 3. Nausea and/or  vomiting 4. Extreme swelling or bruising 5. Continued bleeding from incision. 6. Increased pain, redness, or drainage from the incision. 7. Difficulty swallowing or breathing 8. Muscle cramping or spasms. 9. Numbness or tingling in hands or feet or around lips.  The clinic staff is available to answer your questions during regular business hours.  Please dont hesitate to call and ask to speak to one of the nurses if you have concerns.  For further questions, please visit [email or web address]    Percutaneous Endoscopic Jejunostomy, Care After This sheet gives you information about how to care for yourself after your procedure. Your health care provider may also give you more specific instructions. If you have problems or questions, contact your health care provider. What can I expect after the procedure? After the procedure, it is common to have:  Pain and tenderness around your feeding tube.  A sore throat.  Slight drainage around your feeding tube.  Follow these instructions at home: Activity  Return to your normal activities as told by your health care provider. Ask your health care provider what activities are safe for you.  Take short walks often. Try to take at least two 10-minute walks each day.  Do not drive for 24 hours if you were given a medicine to help you relax (sedative). Incision care  Do not shower for the first 2 days after the procedure.  Do not take a bath, swim, or use a hot tub for the first 2 weeks after the procedure.  Follow instructions from your health care provider about how to take care of your incision. Make sure you: ? Wash your hands with soap and water before you change your bandage (dressing). If soap and water are not available, use hand sanitizer. ? Change your dressing as told by your health care provider. ? Clean the skin around your feeding tube every day. You may need to use a certain kind of solution to do this. ? Apply any  ointments to the area only as told by your health care provider.  Check your incision area every day for signs of infection. Check for: ? More redness, swelling, or pain. ? More fluid or blood. ? Warmth. ? Pus or a bad smell. General instructions  Take over-the-counter and prescription medicines only as told by your health care provider.  Make sure you know when and how to start tube feeding. Contact your health care provider if you have any questions.  Keep all follow-up visits as told by your health care provider. This is important. Contact a health care provider if:  You have pain that gets worse or does not get better with medicine.  You have constipation or diarrhea that does not improve with treatment.  You feel bloated.  You feel nauseous.  You have more redness, swelling, or pain around your incision.  You have more fluid or blood coming from your incision.  Your incision feels warm to the touch.  You have pus or a bad smell coming from your incision.  You have a fever. Get help right away if:  You have a fever that lasts longer than 3 days.  You have severe pain.  You have coughing and trouble breathing.  You vomit blood.  Your feeding tube  is blocked, you cannot rinse out (flush out) the tube, or it comes out.  You become disoriented, confused, or very tired. This information is not intended to replace advice given to you by your health care provider. Make sure you discuss any questions you have with your health care provider. Document Released: 06/19/2013 Document Revised: 03/26/2016 Document Reviewed: 03/26/2016 Elsevier Interactive Patient Education  [employer].

## 2017-07-16 DIAGNOSIS — E46 Unspecified protein-calorie malnutrition: Secondary | ICD-10-CM

## 2017-07-16 LAB — BASIC METABOLIC PANEL
Anion gap: 13 (ref 5–15)
BUN: 8 mg/dL (ref 6–20)
CALCIUM: 8.2 mg/dL — AB (ref 8.9–10.3)
CO2: 24 mmol/L (ref 22–32)
Chloride: 100 mmol/L — ABNORMAL LOW (ref 101–111)
Creatinine, Ser: 1.09 mg/dL (ref 0.61–1.24)
GFR calc Af Amer: >60 mL/min (ref >60)
GLUCOSE: 123 mg/dL — AB (ref 65–99)
Potassium: 4.2 mmol/L (ref 3.5–5.1)
Sodium: 137 mmol/L (ref 135–145)

## 2017-07-16 LAB — GLUCOSE, CAPILLARY
GLUCOSE-CAPILLARY: 102 mg/dL — AB (ref 65–99)
GLUCOSE-CAPILLARY: 123 mg/dL — AB (ref 65–99)
Glucose-Capillary: 105 mg/dL — ABNORMAL HIGH (ref 65–99)
Glucose-Capillary: 118 mg/dL — ABNORMAL HIGH (ref 65–99)

## 2017-07-16 LAB — CEA: CEA: 2.8 ng/mL (ref 0.0–4.7)

## 2017-07-16 LAB — CBC
HCT: 41.7 % (ref 39.0–52.0)
Hemoglobin: 13.7 g/dL (ref 13.0–17.0)
MCH: 29.4 pg (ref 26.0–34.0)
MCHC: 32.9 g/dL (ref 30.0–36.0)
MCV: 89.5 fL (ref 78.0–100.0)
PLATELETS: 203 10*3/uL (ref 150–400)
RBC: 4.66 MIL/uL (ref 4.22–5.81)
RDW: 14.6 % (ref 11.5–15.5)
WBC: 21.1 10*3/uL — AB (ref 4.0–10.5)

## 2017-07-16 MED ORDER — GUAIFENESIN 100 MG/5ML PO SOLN
30.0000 mL | Freq: Two times a day (BID) | ORAL | Status: DC
  Administered 2017-07-16 – 2017-07-17 (×2): 600 mg via ORAL
  Administered 2017-07-18: 30 mL via ORAL
  Administered 2017-07-18: 600 mg via ORAL
  Administered 2017-07-19: 200 mg via ORAL
  Filled 2017-07-16: qty 10
  Filled 2017-07-16: qty 20
  Filled 2017-07-16: qty 30
  Filled 2017-07-16: qty 10
  Filled 2017-07-16: qty 30
  Filled 2017-07-16: qty 10
  Filled 2017-07-16: qty 20
  Filled 2017-07-16 (×2): qty 10
  Filled 2017-07-16: qty 60

## 2017-07-16 MED ORDER — OSMOLITE 1.5 CAL PO LIQD
1000.0000 mL | ORAL | Status: DC
  Administered 2017-07-16 – 2017-07-17 (×2): 1000 mL
  Filled 2017-07-16 (×4): qty 1000

## 2017-07-16 MED ORDER — ADULT MULTIVITAMIN LIQUID CH
15.0000 mL | Freq: Every day | ORAL | Status: DC
  Administered 2017-07-18 – 2017-08-02 (×13): 15 mL via ORAL
  Filled 2017-07-16 (×19): qty 15

## 2017-07-16 MED ORDER — JEVITY 1.2 CAL PO LIQD: 1000.0000 mL | ORAL | Status: DC

## 2017-07-16 NOTE — Progress Notes (Addendum)
[facility]:  [phone] General Surgery Progress Note   LOS: 8 days  POD -  1 Day Post-Op  Chief Complaint: Dysphgia  Assessment and Plan: 1.  Esophageal cancer  Seen by Drs. Sherrill and Lester  2.  FEEDING JEJUNOSTOMY TUBE PLACEMENT - Dalbert Batman - 07/15/2017  To start tube feedings - start Osmolite at 20 cc/hour and advance volume  3.  COPD - on O2  Pnuemonia  On augmentin 4.  Weight loss, malnutrition 5.  DVT prophylaxis - Lovenox   Principal Problem:   Adenocarcinoma of esophagus (HCC) Active Problems:   Essential hypertension   COPD with hypoxia (HCC)   Chronic respiratory failure with hypoxia (HCC)   Intractable nausea and vomiting   CAP (community acquired pneumonia)   Lobar pneumonia (HCC)   Hypomagnesemia   Hypokalemia   Protein-calorie malnutrition, severe (HCC)   Esophageal dysphagia   Esophageal mass   Esophageal stricture   Esophageal cancer (HCC)   Malnutrition of moderate degree   Subjective:  Doing okay.  Not having much pain.  Objective:   Vitals:   07/16/17 0358 07/16/17 0853  BP: 118/75   Pulse: 94   Resp: 16   Temp: 97.7 F (36.5 C)   SpO2: 90% 92%     Intake/Output from previous day:  01/18 0701 - 01/19 0700 In: 2835 [P.O.:835; I.V.:1900; IV Piggyback:100] Out: 1325 [Urine:1275; Blood:50]  Intake/Output this shift:  No intake/output data recorded.   Physical Exam:   General: Thin WM who is alert and oriented.    HEENT: Normal. Pupils equal. .   Lungs: Clear   Abdomen: Soft   Wound: has jejunostomy tube in left upper abdomen   Lab Results:    Recent Labs    07/15/17 1922 07/16/17 0348  WBC 23.4* 21.1*  HGB 15.7 13.7  HCT 46.2 41.7  PLT 246 203    BMET   Recent Labs    07/15/17 1922 07/16/17 0348  NA  --  137  K  --  4.2  CL  --  100*  CO2  --  24  GLUCOSE  --  123*  BUN  --  8  CREATININE 1.00 1.09  CALCIUM  --  8.2*    PT/INR  No results for input(s): LABPROT, INR in the last 72  hours.  ABG  No results for input(s): PHART, HCO3 in the last 72 hours.  Invalid input(s): PCO2, PO2   Studies/Results:  No results found.   Anti-infectives:   Anti-infectives (From admission, onward)   Start     Dose/Rate Route Frequency Ordered Stop   07/16/17 0000  ceFAZolin (ANCEF) IVPB 2g/100 mL premix     2 g 200 mL/hr over 30 Minutes Intravenous Every 8 hours 07/15/17 1833 07/16/17 0054   07/15/17 0945  amoxicillin-clavulanate (AUGMENTIN) 250-62.5 MG/5ML suspension 500 mg     500 mg Oral Every 8 hours 07/15/17 0938     07/15/17 0600  ceFAZolin (ANCEF) IVPB 2g/100 mL premix     2 g 200 mL/hr over 30 Minutes Intravenous On call to O.R. 07/14/17 1809 07/15/17 1632   07/11/17 1600  ampicillin-sulbactam (UNASYN) 1.5 g in sodium chloride 0.9 % 50 mL IVPB  Status:  Discontinued     1.5 g 100 mL/hr over 30 Minutes Intravenous Every 6 hours 07/11/17 1329 07/15/17 0940   07/08/17 1000  cefTRIAXone (ROCEPHIN) 1 g in dextrose 5 % 50 mL IVPB  Status:  Discontinued     1 g 100  mL/hr over 30 Minutes Intravenous Every 24 hours 07/08/17 0954 07/11/17 1315   07/08/17 1000  azithromycin (ZITHROMAX) 500 mg in dextrose 5 % 250 mL IVPB  Status:  Discontinued     500 mg 250 mL/hr over 60 Minutes Intravenous Every 24 hours 07/08/17 0954 07/11/17 1315      Alphonsa Overall, MD, FACS Pager: [facility]: [phone] 07/16/2017

## 2017-07-16 NOTE — Progress Notes (Signed)
[facility] PROGRESS NOTE    Progress Note  George Stokes  UVO:[id number] DOB: 07-15-1945 DOA: 07/07/2017 PCP: Patient, No Pcp Per     Brief Narrative:   George Stokes is an 72 y.o. male past medical history significant for COPD on home oxygen comes for 1 week of nausea vomiting he denies any abdominal pain or hematemesis no diarrhea.  Biopsy showed appearing esophageal stenosis biopsy, with atypical cells suspicious for adenocarcinoma  Assessment/Plan:   Intractable nausea and vomiting due to malignant esophageal stenosis: CT scan of the abdomen pelvis showed no acute findings. GI was consulted recommended an EGD performed on 07/13/2016. Consulted general surgery, and feeding jejunostomy tube placed on 07/15/2017. Consulted nutrition for enteral feedings. Radiation oncology was consulted and awaiting recommendation CEA 2.8.  Likely aspiration pneumonia: From intractable nausea and vomiting Change Unasyn to liquid Augmentin. Lung exams are clear has remained afebrile.  Acute kidney injury: Likely prerenal in etiology resolved with IV fluid hydration.  Hypokalemia/hypomagnesemia: Repleted orally now stable.  Bilateral renal hypodense lesions seen on CT: [place] confirmed renal cyst  COPD oxygen dependent: No wheezing stable  History of gout:  Alcohol use: No signs of withdrawal.  Malnutrition of moderate degree Likely due to alcohol abuse in the setting of malignant esophageal stenosis.  Ensure 3 times daily. Nutritional consult.   DVT prophylaxis: lovenox Family Communication:none Disposition Plan/Barrier to D/C: unable to determine Code Status:     Code Status Orders  (From admission, onward)        Start     Ordered   07/08/17 0955  Full code  Continuous     07/08/17 0954    Code Status History    Date Active Date Inactive Code Status Order ID Comments User Context   This patient has a current code status but no historical code status.        IV  Access:    Peripheral IV   Procedures and diagnostic studies:   No results found.   Medical Consultants:    None.  Anti-Infectives:   None  Subjective:    [name] no further complaints, has no pain.  Objective:    Vitals:   07/15/17 1953 07/15/17 2127 07/16/17 0358 07/16/17 0853  BP: (!) 146/94  118/75   Pulse: 96  94   Resp: 14  16   Temp: 97.9 F (36.6 C)  97.7 F (36.5 C)   TempSrc: Oral  Oral   SpO2: 95% 93% 90% 92%  Weight:      Height:        Intake/Output Summary (Last 24 hours) at 07/16/2017 0915 Last data filed at 07/16/2017 0359 Gross per 24 hour  Intake 2835 ml  Output 1325 ml  Net 1510 ml   Filed Weights   07/07/17 1543 07/13/17 0922  Weight: 54.4 kg (120 lb) 54.4 kg (120 lb)    Exam: General exam: In no acute distress, cachectic appearing. Respiratory system: Good air movement and clear to auscultation. Cardiovascular system: S1 & S2 heard, RRR.  Gastrointestinal system: Abdomen is nondistended, soft and nontender.  Central nervous system: Alert and oriented. No focal neurological deficits. Extremities: No pedal edema. Skin: No rashes, lesions or ulcers Psychiatry: Judgement and insight appear normal. Mood & affect appropriate.    Data Reviewed:    Labs: Basic Metabolic Panel: Recent Labs  Lab 07/10/17 0349 07/11/17 0406 07/12/17 0342 07/13/17 0323 07/15/17 1922 07/16/17 0348  NA 138 134* 135 137  --  137  K 3.8  3.2* 3.5 3.5  --  4.2  CL 99* 93* 91* 96*  --  100*  CO2 31 32 32 31  --  24  GLUCOSE 99 107* 94 74  --  123*  BUN 12 6 6  5*  --  8  CREATININE 1.01 0.91 1.06 0.99 1.00 1.09  CALCIUM 8.7* 8.8* 8.9 8.8*  --  8.2*  MG 1.2* 1.6* 2.2  --   --   --    GFR Estimated Creatinine Clearance: 49.2 mL/min (by C-G formula based on SCr of 1.09 mg/dL). Liver Function Tests: Recent Labs  Lab 07/10/17 0349 07/12/17 0342  AST 24 32  ALT 16* 21  ALKPHOS 62 87  BILITOT 1.0 0.6  PROT 5.2* 6.6  ALBUMIN 2.9* 3.7    Recent Labs  Lab 07/11/17 0406 07/12/17 0342  LIPASE 49 39   No results for input(s): AMMONIA in the last 168 hours. Coagulation profile Recent Labs  Lab 07/11/17 0406  INR 1.06    CBC: Recent Labs  Lab 07/10/17 0349 07/15/17 1922 07/16/17 0348  WBC 8.5 23.4* 21.1*  NEUTROABS 6.5  --   --   HGB 13.8 15.7 13.7  HCT 41.9 46.2 41.7  MCV 89.3 89.4 89.5  PLT 190 246 203   Cardiac Enzymes: No results for input(s): CKTOTAL, CKMB, CKMBINDEX, TROPONINI in the last 168 hours. BNP (last 3 results) No results for input(s): PROBNP in the last 8760 hours. CBG: No results for input(s): GLUCAP in the last 168 hours. D-Dimer: No results for input(s): DDIMER in the last 72 hours. Hgb A1c: No results for input(s): HGBA1C in the last 72 hours. Lipid Profile: No results for input(s): CHOL, HDL, LDLCALC, TRIG, CHOLHDL, LDLDIRECT in the last 72 hours. Thyroid function studies: No results for input(s): TSH, T4TOTAL, T3FREE, THYROIDAB in the last 72 hours.  Invalid input(s): FREET3 Anemia work up: No results for input(s): VITAMINB12, FOLATE, FERRITIN, TIBC, IRON, RETICCTPCT in the last 72 hours. Sepsis Labs: Recent Labs  Lab 07/10/17 0349 07/15/17 1922 07/16/17 0348  WBC 8.5 23.4* 21.1*   Microbiology Recent Results (from the past 240 hour(s))  Culture, blood (routine x 2) Call MD if unable to obtain prior to antibiotics being given     Status: None   Collection Time: 07/08/17 10:41 AM  Result Value Ref Range Status   Specimen Description BLOOD LEFT ANTECUBITAL  Final   Special Requests   Final    BOTTLES DRAWN AEROBIC AND ANAEROBIC Blood Culture adequate volume   Culture   Final    NO GROWTH 5 DAYS Performed at [facility], [address], [place], [place] [address]    Report Status 07/13/2017 FINAL  Final  Culture, blood (routine x 2) Call MD if unable to obtain prior to antibiotics being given     Status: None   Collection Time: 07/08/17 11:20 AM  Result Value  Ref Range Status   Specimen Description BLOOD RIGHT FOREARM  Final   Special Requests   Final    BOTTLES DRAWN AEROBIC AND ANAEROBIC Blood Culture adequate volume   Culture   Final    NO GROWTH 5 DAYS Performed at [facility], [address] [place], [place] [address]    Report Status 07/13/2017 FINAL  Final     Medications:   . amoxicillin-clavulanate  500 mg Oral Q8H  . enoxaparin (LOVENOX) injection  40 mg Subcutaneous Q24H  . guaiFENesin  600 mg Oral BID  . ipratropium-albuterol  3  mL Nebulization BID  . mometasone-formoterol  2 puff Inhalation BID  . multivitamin with minerals  1 tablet Oral Daily  . pantoprazole (PROTONIX) IV  40 mg Intravenous Q12H   Continuous Infusions: . 0.9 % NaCl with KCl 20 mEq / L 100 mL/hr at 07/15/17 2001  . feeding supplement (OSMOLITE 1.5 CAL)    . lactated ringers       LOS: 8 days   Marinda Elk  [facility] Pager [phone]  *Please refer to [email or web address], password [email or web address] to get updated schedule on who will round on this patient, as hospitalists switch teams weekly. If 7PM-7AM, please contact night-coverage at [email or web address], password [email or web address] for any overnight needs.  07/16/2017, 9:15 AM

## 2017-07-16 NOTE — Progress Notes (Signed)
Offered to get pt. Up and out of bed to chair multiple times and he refused. Pt. got up to sit on BSC while bed was being zeroed then back to bed.

## 2017-07-16 NOTE — Progress Notes (Signed)
Brief Nutrition Note  Consult received for enteral/tube feeding initiation and management.  Patient was seen by RD yesterday w/ following TF recommendations.   TF recommendations: Initiate Osmolite 1.5 @ 20 ml/hr via J-tube and advance by 10 ml every 12 hours to goal of 60 ml/hr.   Patient will be at risk of refeeding syndrome. Once TF begins, please monitor magnesium, potassium, and phosphorus daily for at least 3 days, MD to replete as needed.  Noted last Mag/K labs indicate repletion  Will implement these recommendations  Admitting Dx: Acute kidney injury ([place]) [N17.9]  Labs:  Recent Labs  Lab 07/10/17 0349 07/11/17 0406 07/12/17 0342 07/13/17 0323 07/15/17 1922 07/16/17 0348  NA 138 134* 135 137  --  137  K 3.8 3.2* 3.5 3.5  --  4.2  CL 99* 93* 91* 96*  --  100*  CO2 31 32 32 31  --  24  BUN 12 6 6  5*  --  8  CREATININE 1.01 0.91 1.06 0.99 1.00 1.09  CALCIUM 8.7* 8.8* 8.9 8.8*  --  8.2*  MG 1.2* 1.6* 2.2  --   --   --   GLUCOSE 99 107* 94 74  --  123*    Burtis Junes RD, LDN, CNSC Clinical Nutrition Pager: [phone] 07/16/2017 7:55 AM

## 2017-07-17 LAB — GLUCOSE, CAPILLARY
GLUCOSE-CAPILLARY: 139 mg/dL — AB (ref 65–99)
GLUCOSE-CAPILLARY: 139 mg/dL — AB (ref 65–99)
GLUCOSE-CAPILLARY: 98 mg/dL (ref 65–99)
Glucose-Capillary: 152 mg/dL — ABNORMAL HIGH (ref 65–99)
Glucose-Capillary: 126 mg/dL — ABNORMAL HIGH (ref 65–99)

## 2017-07-17 LAB — CBC
HCT: 43.2 % (ref 39.0–52.0)
HEMOGLOBIN: 14.1 g/dL (ref 13.0–17.0)
MCH: 29 pg (ref 26.0–34.0)
MCHC: 32.6 g/dL (ref 30.0–36.0)
MCV: 88.9 fL (ref 78.0–100.0)
Platelets: 193 10*3/uL (ref 150–400)
RBC: 4.86 MIL/uL (ref 4.22–5.81)
RDW: 14.9 % (ref 11.5–15.5)
WBC: 11.9 10*3/uL — ABNORMAL HIGH (ref 4.0–10.5)

## 2017-07-17 LAB — BASIC METABOLIC PANEL
Anion gap: 10 (ref 5–15)
BUN: 10 mg/dL (ref 6–20)
CHLORIDE: 101 mmol/L (ref 101–111)
CO2: 27 mmol/L (ref 22–32)
CREATININE: 1.02 mg/dL (ref 0.61–1.24)
Calcium: 8.7 mg/dL — ABNORMAL LOW (ref 8.9–10.3)
GFR calc Af Amer: >60 mL/min (ref >60)
GFR calc non Af Amer: >60 mL/min (ref >60)
GLUCOSE: 113 mg/dL — AB (ref 65–99)
Potassium: 4 mmol/L (ref 3.5–5.1)
SODIUM: 138 mmol/L (ref 135–145)

## 2017-07-17 MED ORDER — OSMOLITE 1.5 CAL PO LIQD: 1000.0000 mL | ORAL | Status: DC

## 2017-07-17 NOTE — Progress Notes (Signed)
Nutrition Follow-up  DOCUMENTATION CODES:   Non-severe (moderate) malnutrition in context of chronic illness  INTERVENTION:   Patient at risk of refeeding syndrome. Once TF begins, please monitor magnesium, potassium, and phosphorus daily for at least 3 days, MD to replete as needed.  Restart Osmolite 1.5 @ 10 ml/hr, advance by 10 ml every 24 hours today towards goal rate of 60 ml/hr. At goal rate, this will provide 2160 kcal, 90g protein and 1097 ml H2O. Once nausea more controlled, may be able to advance quicker.  NUTRITION DIAGNOSIS:   Moderate Malnutrition related to chronic illness, nausea, vomiting(ETOH abuse) as evidenced by percent weight loss, energy intake < or equal to 75% for > or equal to 1 month, moderate fat depletion, moderate muscle depletion.  Ongoing.  GOAL:   Patient will meet greater than or equal to 90% of their needs  Not meeting.  MONITOR:   PO intake, Labs, Weight trends, TF tolerance, I & O's  REASON FOR ASSESSMENT:   Consult Assessment of nutrition requirement/status  ASSESSMENT:   72 y.o. male with medical history significant of COPD, CRF on 4 liters he thinks at home, htn comes in with over a week of nausea and vomiting with no abdominal pain or blood in vomit.  No fevers.  No diarrhea. No cough.  No chest pain.  He says he gets nauseated even after drinking liquids.  Pt referred for admission for report of wt loss and his n/v.  Events: 1/14: DG of esophagus shows esophageal stricture 1/16: EGD reveals malignant esophageal mass 1/17: biopsies confirm adenocarcinoma per GI note 1/18: J-tube placed 1/19: TF initiated, Osmolite 1.5 @ 20 ml/hr  Patient requested this morning that TF be turned off. TF was then advanced to 30 ml/hr afterwards, pt developed nausea. MD wanting TF to be turned off for 1-2 hours then restarted at 10 ml/hr. Will continue to advance slowly every 24 hours now.   Medications: Augmentin suspension every 8 hours, Liquid  MVI daily, IV Protonix every 12 hours, IV Zofran PRN, IV Phenergan PRN Labs reviewed: CBGs: 139-152  Diet Order:  Diet clear liquid Room service appropriate? Yes; Fluid consistency: Thin  EDUCATION NEEDS:   Education needs have been addressed  Skin:  Skin Assessment: Reviewed RN Assessment  Last BM:  1/17  Height:   Ht Readings from Last 1 Encounters:  07/13/17 5' 7.25" (1.708 m)    Weight:   Wt Readings from Last 1 Encounters:  07/16/17 131 lb 9.8 oz (59.7 kg)    Ideal Body Weight:  67.3 kg  BMI:  Body mass index is 20.46 kg/m.  Estimated Nutritional Needs:   Kcal:  1900-2100  Protein:  90-100g  Fluid:  2L/day  [name], MS, RD, LDN [place] [facility] Pager: [phone] After Hours Pager: [phone]

## 2017-07-17 NOTE — Progress Notes (Signed)
Nurse stated that the pt had his O2 out of his nose and his SATS were 65%. RT gave the Pt a breathing tx and placed him 4L [place]. [place] needs to be titrated.

## 2017-07-17 NOTE — Progress Notes (Addendum)
[facility] PROGRESS NOTE    Progress Note  George Stokes  PPI:[id number] DOB: 1945/12/06 DOA: 07/07/2017 PCP: Patient, No Pcp Per     Brief Narrative:   George Stokes is an 72 y.o. male past medical history significant for COPD on home oxygen comes for 1 week of nausea vomiting he denies any abdominal pain or hematemesis no diarrhea.  Biopsy showed appearing esophageal stenosis biopsy, with atypical cells suspicious for adenocarcinoma  Assessment/Plan:   Intractable nausea and vomiting due to malignant esophageal stenosis/adenocarcinoma of the esophagus: CT scan of the abdomen pelvis showed no acute findings. GI was consulted recommended an EGD performed on 07/13/2016. Consulted general surgery, and feeding jejunostomy tube placed on 07/15/2017. CEA 2.8.  Likely aspiration pneumonia: From intractable nausea and vomiting Continue Augmentin for 2 additional days. Acute kidney injury: Likely prerenal in etiology resolved with IV fluid hydration.  Hypokalemia/hypomagnesemia: Repleted orally now stable.  Bilateral renal hypodense lesions seen on CT: [place] confirmed renal cyst  COPD oxygen dependent: No wheezing stable  History of gout:  Alcohol use: No signs of withdrawal.  Malnutrition of moderate degree Likely due to alcohol abuse in the setting of malignant esophageal stenosis.  Ensure 3 times daily. Nutritional feeds which are causing him to be nauseated. We will go ahead and stop it for 1-2 hours and started at 10 and increased very slowly.   DVT prophylaxis: lovenox Family Communication:none Disposition Plan/Barrier to D/C: unable to determine Code Status:     Code Status Orders  (From admission, onward)        Start     Ordered   07/08/17 0955  Full code  Continuous     07/08/17 0954    Code Status History    Date Active Date Inactive Code Status Order ID Comments User Context   This patient has a current code status but no historical code status.        IV Access:    Peripheral IV   Procedures and diagnostic studies:   No results found.   Medical Consultants:    None.  Anti-Infectives:   None  Subjective:    George Stokes he relates he is significantly nauseated this morning.  Objective:    Vitals:   07/16/17 0853 07/16/17 1100 07/16/17 1430 07/16/17 2045  BP:   115/63 121/74  Pulse:   91 87  Resp:   18 18  Temp:   98.7 F (37.1 C) 98 F (36.7 C)  TempSrc:   Oral Oral  SpO2: 92%  93% 94%  Weight:  59.7 kg (131 lb 9.8 oz)    Height:        Intake/Output Summary (Last 24 hours) at 07/17/2017 0922 Last data filed at 07/16/2017 1757 Gross per 24 hour  Intake 503.33 ml  Output 350 ml  Net 153.33 ml   Filed Weights   07/07/17 1543 07/13/17 0922 07/16/17 1100  Weight: 54.4 kg (120 lb) 54.4 kg (120 lb) 59.7 kg (131 lb 9.8 oz)    Exam: General exam: In no acute distress, cachectic appearing. Respiratory system: Good air movement and clear to auscultation. Cardiovascular system: S1 & S2 heard, RRR.  Gastrointestinal system: Abdomen is nondistended, soft and nontender.  Central nervous system: Alert and oriented. No focal neurological deficits. Extremities: No pedal edema. Skin: No rashes, lesions or ulcers    Data Reviewed:    Labs: Basic Metabolic Panel: Recent Labs  Lab 07/11/17 0406 07/12/17 0630 07/13/17 1601 07/15/17 1922 07/16/17 0348 07/17/17 0932  NA 134* 135 137  --  137 138  K 3.2* 3.5 3.5  --  4.2 4.0  CL 93* 91* 96*  --  100* 101  CO2 32 32 31  --  24 27  GLUCOSE 107* 94 74  --  123* 113*  BUN 6 6 5*  --  8 10  CREATININE 0.91 1.06 0.99 1.00 1.09 1.02  CALCIUM 8.8* 8.9 8.8*  --  8.2* 8.7*  MG 1.6* 2.2  --   --   --   --    GFR Estimated Creatinine Clearance: 57.7 mL/min (by C-G formula based on SCr of 1.02 mg/dL). Liver Function Tests: Recent Labs  Lab 07/12/17 0342  AST 32  ALT 21  ALKPHOS 87  BILITOT 0.6  PROT 6.6  ALBUMIN 3.7   Recent Labs  Lab  07/11/17 0406 07/12/17 0342  LIPASE 49 39   No results for input(s): AMMONIA in the last 168 hours. Coagulation profile Recent Labs  Lab 07/11/17 0406  INR 1.06    CBC: Recent Labs  Lab 07/15/17 1922 07/16/17 0348 07/17/17 0253  WBC 23.4* 21.1* 11.9*  HGB 15.7 13.7 14.1  HCT 46.2 41.7 43.2  MCV 89.4 89.5 88.9  PLT 246 203 193   Cardiac Enzymes: No results for input(s): CKTOTAL, CKMB, CKMBINDEX, TROPONINI in the last 168 hours. BNP (last 3 results) No results for input(s): PROBNP in the last 8760 hours. CBG: Recent Labs  Lab 07/16/17 1140 07/16/17 1649 07/16/17 2151 07/17/17 0458 07/17/17 0743  GLUCAP 118* 102* 123* 152* 139*   D-Dimer: No results for input(s): DDIMER in the last 72 hours. Hgb A1c: No results for input(s): HGBA1C in the last 72 hours. Lipid Profile: No results for input(s): CHOL, HDL, LDLCALC, TRIG, CHOLHDL, LDLDIRECT in the last 72 hours. Thyroid function studies: No results for input(s): TSH, T4TOTAL, T3FREE, THYROIDAB in the last 72 hours.  Invalid input(s): FREET3 Anemia work up: No results for input(s): VITAMINB12, FOLATE, FERRITIN, TIBC, IRON, RETICCTPCT in the last 72 hours. Sepsis Labs: Recent Labs  Lab 07/15/17 1922 07/16/17 0348 07/17/17 0253  WBC 23.4* 21.1* 11.9*   Microbiology Recent Results (from the past 240 hour(s))  Culture, blood (routine x 2) Call MD if unable to obtain prior to antibiotics being given     Status: None   Collection Time: 07/08/17 10:41 AM  Result Value Ref Range Status   Specimen Description BLOOD LEFT ANTECUBITAL  Final   Special Requests   Final    BOTTLES DRAWN AEROBIC AND ANAEROBIC Blood Culture adequate volume   Culture   Final    NO GROWTH 5 DAYS Performed at [facility], [address], [place], [place] [address]    Report Status 07/13/2017 FINAL  Final  Culture, blood (routine x 2) Call MD if unable to obtain prior to antibiotics being given     Status: None   Collection Time:  07/08/17 11:20 AM  Result Value Ref Range Status   Specimen Description BLOOD RIGHT FOREARM  Final   Special Requests   Final    BOTTLES DRAWN AEROBIC AND ANAEROBIC Blood Culture adequate volume   Culture   Final    NO GROWTH 5 DAYS Performed at [facility], [address], [place], [place] [address]    Report Status 07/13/2017 FINAL  Final     Medications:   . amoxicillin-clavulanate  500 mg Oral Q8H  . enoxaparin (LOVENOX) injection  40 mg Subcutaneous Q24H  . guaiFENesin  30 mL Oral BID  . ipratropium-albuterol  3 mL Nebulization BID  . mometasone-formoterol  2 puff Inhalation BID  . multivitamin  15 mL Oral Daily  . pantoprazole (PROTONIX) IV  40 mg Intravenous Q12H   Continuous Infusions: . feeding supplement (OSMOLITE 1.5 CAL) 1,000 mL (07/17/17 0730)  . lactated ringers       LOS: 9 days   Marinda Elk  [facility] Pager [phone]  *Please refer to [email or web address], password [email or web address] to get updated schedule on who will round on this patient, as hospitalists switch teams weekly. If 7PM-7AM, please contact night-coverage at [email or web address], password [email or web address] for any overnight needs.  07/17/2017, 9:22 AM

## 2017-07-17 NOTE — Progress Notes (Signed)
Pt. BP 166/102, 162/95 HR 98-106. Pt. denies complaints of chest pain, dizziness, and no shortness of breath noted. Pt. Had an episode of emesis. MD notified and order to hold tube feedings received.

## 2017-07-18 ENCOUNTER — Encounter (HOSPITAL_COMMUNITY): Payer: Self-pay | Admitting: General Surgery

## 2017-07-18 ENCOUNTER — Inpatient Hospital Stay
Admit: 2017-07-18 | Discharge: 2017-07-18 | Disposition: A | Discharge status: Home / Self Care | Payer: Medicare Other | Attending: Radiation Oncology | Admitting: Radiation Oncology

## 2017-07-18 ENCOUNTER — Ambulatory Visit
Admission: RE | Admit: 2017-07-18 | Discharge: 2017-07-18 | Disposition: A | Discharge status: Home / Self Care | Payer: Medicare Other | Source: Ambulatory Visit | Attending: Radiation Oncology | Admitting: Radiation Oncology

## 2017-07-18 DIAGNOSIS — C155 Malignant neoplasm of lower third of esophagus: Secondary | ICD-10-CM | POA: Insufficient documentation

## 2017-07-18 DIAGNOSIS — Z510 Encounter for antineoplastic radiation therapy: Secondary | ICD-10-CM | POA: Insufficient documentation

## 2017-07-18 DIAGNOSIS — C159 Malignant neoplasm of esophagus, unspecified: Secondary | ICD-10-CM

## 2017-07-18 LAB — GLUCOSE, CAPILLARY
GLUCOSE-CAPILLARY: 97 mg/dL (ref 65–99)
Glucose-Capillary: 95 mg/dL (ref 65–99)
Glucose-Capillary: 115 mg/dL — ABNORMAL HIGH (ref 65–99)
Glucose-Capillary: 115 mg/dL — ABNORMAL HIGH (ref 65–99)

## 2017-07-18 MED ORDER — OSMOLITE 1.5 CAL PO LIQD
1000.0000 mL | ORAL | Status: DC
  Administered 2017-07-18: 1000 mL
  Filled 2017-07-18 (×3): qty 1000

## 2017-07-18 NOTE — Progress Notes (Signed)
[facility] PROGRESS NOTE    Progress Note  George Stokes  ZOX:[id number] DOB: [birth year] DOA: 07/07/2017 PCP: Patient, No Pcp Per     Brief Narrative:   George Stokes is an 72 y.o. male past medical history significant for COPD on home oxygen comes for 1 week of nausea vomiting he denies any abdominal pain or hematemesis no diarrhea.  Biopsy showed appearing esophageal stenosis biopsy, with atypical cells suspicious for adenocarcinoma  Assessment/Plan:   Intractable nausea and vomiting due to malignant esophageal stenosis/adenocarcinoma of the esophagus: CT scan of the abdomen pelvis showed no acute findings. GI was consulted recommended an EGD performed on 07/13/2016. Consulted general surgery, and feeding jejunostomy tube placed on 07/15/2017. CEA 2.8. He was having nausea with tube feedings, nutritionist was reconsulted, to change formula will start low and  increase slowly.  Likely aspiration pneumonia: From intractable nausea and vomiting Continue Augmentin for 2 additional days.  Acute kidney injury: Likely prerenal in etiology resolved with IV fluid hydration.  Hypokalemia/hypomagnesemia: Repleted orally now stable.  Bilateral renal hypodense lesions seen on CT: [place] confirmed renal cyst  COPD oxygen dependent: No wheezing stable, on oxygen.   History of gout:  Alcohol use: No signs of withdrawal.  Malnutrition of moderate degree Likely due to alcohol abuse in the setting of malignant esophageal stenosis.  Ensure 3 times daily. Nutritional feeds which are causing him to be nauseated. Nutritionist consulted recommended to restart the feedings and increase every 24 hours.   DVT prophylaxis: lovenox Family Communication:none Disposition Plan/Barrier to D/C: unable to determine Code Status:     Code Status Orders  (From admission, onward)        Start     Ordered   07/08/17 0955  Full code  Continuous     07/08/17 0954    Code Status History    Date Active Date Inactive Code Status Order ID Comments User Context   This patient has a current code status but no historical code status.        IV Access:    Peripheral IV   Procedures and diagnostic studies:   No results found.   Medical Consultants:    None.  Anti-Infectives:   None  Subjective:    George Stokes he relates he is significantly nauseated this morning.  Objective:    Vitals:   07/17/17 1531 07/17/17 1900 07/17/17 2108 07/17/17 2135  BP: (!) 166/102   (!) 182/102  Pulse: 98   97  Resp:    16  Temp:    98 F (36.7 C)  TempSrc:    Oral  SpO2: 94%  92% 97%  Weight:  59.7 kg (131 lb 9.8 oz)    Height:        Intake/Output Summary (Last 24 hours) at 07/18/2017 0907 Last data filed at 07/18/2017 0700 Gross per 24 hour  Intake 680 ml  Output 500 ml  Net 180 ml   Filed Weights   07/13/17 0922 07/16/17 1100 07/17/17 1900  Weight: 54.4 kg (120 lb) 59.7 kg (131 lb 9.8 oz) 59.7 kg (131 lb 9.8 oz)    Exam: General exam: In no acute distress, cachectic appearing. Respiratory system: Good air movement and clear to auscultation. Cardiovascular system: S1 & S2 heard, RRR.  Gastrointestinal system: Abdomen is nondistended, soft and nontender.  Central nervous system: Alert and oriented. No focal neurological deficits. Extremities: No pedal edema. Skin: No rashes, lesions or ulcers    Data Reviewed:    Labs: Basic  Metabolic Panel: Recent Labs  Lab 07/12/17 0342 07/13/17 0323 07/15/17 1922 07/16/17 0348 07/17/17 0253  NA 135 137  --  137 138  K 3.5 3.5  --  4.2 4.0  CL 91* 96*  --  100* 101  CO2 32 31  --  24 27  GLUCOSE 94 74  --  123* 113*  BUN 6 5*  --  8 10  CREATININE 1.06 0.99 1.00 1.09 1.02  CALCIUM 8.9 8.8*  --  8.2* 8.7*  MG 2.2  --   --   --   --    GFR Estimated Creatinine Clearance: 57.7 mL/min (by C-G formula based on SCr of 1.02 mg/dL). Liver Function Tests: Recent Labs  Lab 07/12/17 0342  AST 32  ALT 21    ALKPHOS 87  BILITOT 0.6  PROT 6.6  ALBUMIN 3.7   Recent Labs  Lab 07/12/17 0342  LIPASE 39   No results for input(s): AMMONIA in the last 168 hours. Coagulation profile No results for input(s): INR, PROTIME in the last 168 hours.  CBC: Recent Labs  Lab 07/15/17 1922 07/16/17 0348 07/17/17 0253  WBC 23.4* 21.1* 11.9*  HGB 15.7 13.7 14.1  HCT 46.2 41.7 43.2  MCV 89.4 89.5 88.9  PLT 246 203 193   Cardiac Enzymes: No results for input(s): CKTOTAL, CKMB, CKMBINDEX, TROPONINI in the last 168 hours. BNP (last 3 results) No results for input(s): PROBNP in the last 8760 hours. CBG: Recent Labs  Lab 07/17/17 0743 07/17/17 1201 07/17/17 1649 07/17/17 2132 07/18/17 0747  GLUCAP 139* 139* 126* 98 95   D-Dimer: No results for input(s): DDIMER in the last 72 hours. Hgb A1c: No results for input(s): HGBA1C in the last 72 hours. Lipid Profile: No results for input(s): CHOL, HDL, LDLCALC, TRIG, CHOLHDL, LDLDIRECT in the last 72 hours. Thyroid function studies: No results for input(s): TSH, T4TOTAL, T3FREE, THYROIDAB in the last 72 hours.  Invalid input(s): FREET3 Anemia work up: No results for input(s): VITAMINB12, FOLATE, FERRITIN, TIBC, IRON, RETICCTPCT in the last 72 hours. Sepsis Labs: Recent Labs  Lab 07/15/17 1922 07/16/17 0348 07/17/17 0253  WBC 23.4* 21.1* 11.9*   Microbiology Recent Results (from the past 240 hour(s))  Culture, blood (routine x 2) Call MD if unable to obtain prior to antibiotics being given     Status: None   Collection Time: 07/08/17 10:41 AM  Result Value Ref Range Status   Specimen Description BLOOD LEFT ANTECUBITAL  Final   Special Requests   Final    BOTTLES DRAWN AEROBIC AND ANAEROBIC Blood Culture adequate volume   Culture   Final    NO GROWTH 5 DAYS Performed at [facility], [address], [place], [place] [address]    Report Status 07/13/2017 FINAL  Final  Culture, blood (routine x 2) Call MD if unable to obtain prior  to antibiotics being given     Status: None   Collection Time: 07/08/17 11:20 AM  Result Value Ref Range Status   Specimen Description BLOOD RIGHT FOREARM  Final   Special Requests   Final    BOTTLES DRAWN AEROBIC AND ANAEROBIC Blood Culture adequate volume   Culture   Final    NO GROWTH 5 DAYS Performed at [facility], [address] [place], [place] [address]    Report Status 07/13/2017 FINAL  Final     Medications:   . amoxicillin-clavulanate  500 mg Oral Q8H  . enoxaparin (LOVENOX) injection  40  mg Subcutaneous Q24H  . guaiFENesin  30 mL Oral BID  . ipratropium-albuterol  3 mL Nebulization BID  . mometasone-formoterol  2 puff Inhalation BID  . multivitamin  15 mL Oral Daily  . pantoprazole (PROTONIX) IV  40 mg Intravenous Q12H   Continuous Infusions: . lactated ringers 100 mL/hr at 07/18/17 0457     LOS: 10 days   Marinda Elk  [facility] Pager [phone]  *Please refer to [email or web address], password [email or web address] to get updated schedule on who will round on this patient, as hospitalists switch teams weekly. If 7PM-7AM, please contact night-coverage at [email or web address], password [email or web address] for any overnight needs.  07/18/2017, 9:07 AM

## 2017-07-18 NOTE — Progress Notes (Signed)
PT Cancellation Note  Patient Details Name: George Stokes MRN: [id number] DOB: 27-Apr-1946   Cancelled Treatment:    Reason Eval/Treat Not Completed: Fatigue/lethargy limiting ability to participate; patient already walked around the unit earlier this afternoon.  Too fatigued to participate with PT.  Will attempt another day.   Reginia Naas 07/18/2017, 1:36 PM  Magda Kiel, [place] 07/18/2017

## 2017-07-18 NOTE — Progress Notes (Signed)
Patient ambulated one circuit of the unit, a distance of 500', with frequent rest stops.

## 2017-07-18 NOTE — Consult Note (Addendum)
Radiation Oncology         [phone] ________________________________  Name: Darreyl Cazes        MRN: [id number]  Date of Service: 07/18/17 DOB: [birth year]  FT:[id number], No Pcp Per  No ref. provider found     REFERRING PHYSICIAN: No ref. provider found   DIAGNOSIS: The primary encounter diagnosis was Acute kidney injury (HCC). Diagnoses of Creatinine elevation, N&V (nausea and vomiting), and Intractable nausea and vomiting were also pertinent to this visit.   HISTORY OF PRESENT ILLNESS: Nthony Triplett is a 72 y.o. male seen at the request of Dr. Truett Perna for a newly diagnosed esophageal cancer. The patient presented to the emergency room on 07/07/2017 with several weeks of nausea vomiting and regurgitating undigested food.  The patient had lost 20-30 pounds in the last 6 months, and had been drinking up to a 6 pack of beer per day for several months, though his last drink was 3 weeks prior to presenting because of his symptoms.  CT imaging of the chest and abdomen revealed mixed fluid density within the distal esophagus, no visible adenopathy, as well as concerns for community-acquired pneumonia.  Diagnostic imaging of the esophagus revealed significant stricturing.  He was seen by GI, and was taken to the endoscopy suite by Dr. Lavon Paganini on 07/13/2017 there, mass at the GE junction was identified, and biopsied.  The lesion was 5 mm in thickness and 2 cm in length.  Final pathology revealed an adenocarcinoma.  He has undergone a feeding jejunostomy tube on 07/15/2017.  His CEA level has also been checked and it was 2.8.  He has been having some trouble with tolerating tube feeds, and nutrition has been working on this.  He continues on oral antibiotics at this point for his pneumonia, and the hope is that he will discharge home in the near future.   PREVIOUS RADIATION THERAPY: No   PAST MEDICAL HISTORY:  Past Medical History:  Diagnosis Date  . COPD (chronic obstructive pulmonary disease) (HCC)    . Dyspnea   . Esophageal mass   . Gout   . Heart murmur    during teenage years   . Hypertension   . On home oxygen therapy    1-2 L/M   . Pneumonia        PAST SURGICAL HISTORY: Past Surgical History:  Procedure Laterality Date  . ESOPHAGOGASTRODUODENOSCOPY (EGD) WITH PROPOFOL N/A 07/13/2017   Procedure: ESOPHAGOGASTRODUODENOSCOPY (EGD) WITH PROPOFOL;  Surgeon: Napoleon Form, MD;  Location: [facility];  Service: Endoscopy;  Laterality: N/A;  . TONSILLECTOMY       FAMILY HISTORY:  Family History  Problem Relation Age of Onset  . Dementia Mother   . Cancer Father   . Gout Father   . Thyroid disease Sister   . Kidney disease Brother   . Gout Brother   . Diabetes Maternal Grandmother   . Stroke Maternal Grandfather      SOCIAL HISTORY:  reports that he quit smoking about 8 years ago. His smoking use included cigarettes. He has a 86.00 pack-year smoking history. he has never used smokeless tobacco. He reports that he drinks alcohol. He reports that he does not use drugs. The patient is single and a retired Medical illustrator. He graduated from [employer]. His brother accompanies him but lives in [place].   ALLERGIES: Patient has no known allergies.   MEDICATIONS:  Current Facility-Administered Medications  Medication Dose Route Frequency Provider Last Rate Last Dose  . albuterol (PROVENTIL) (2.5  MG/3ML) 0.083% nebulizer solution 2.5 mg  2.5 mg Nebulization Q4H PRN Hillary Bow, DO   2.5 mg at 07/17/17 1439  . amoxicillin-clavulanate (AUGMENTIN) 250-62.5 MG/5ML suspension 500 mg  500 mg Oral Q8H Marinda Elk, MD   500 mg at 07/18/17 0456  . enoxaparin (LOVENOX) injection 40 mg  40 mg Subcutaneous Q24H Claud Kelp, MD   40 mg at 07/17/17 1421  . feeding supplement (OSMOLITE 1.5 CAL) liquid 1,000 mL  1,000 mL Per Tube Q24H Marinda Elk, MD      . guaiFENesin (ROBITUSSIN) 100 MG/5ML solution 600 mg  30 mL Oral BID Schorr, Roma Kayser, NP   30 mL  at 07/18/17 0958  . HYDROcodone-acetaminophen (NORCO/VICODIN) 5-325 MG per tablet 1-2 tablet  1-2 tablet Oral Q4H PRN Claud Kelp, MD      . HYDROmorphone (DILAUDID) injection 1 mg  1 mg Intravenous Q2H PRN Claud Kelp, MD   1 mg at 07/18/17 0532  . indomethacin (INDOCIN) capsule 50 mg  50 mg Oral Q8H PRN Tarry Kos A, MD      . ipratropium-albuterol (DUONEB) 0.5-2.5 (3) MG/3ML nebulizer solution 3 mL  3 mL Nebulization BID Marinda Elk, MD   3 mL at 07/18/17 0757  . lactated ringers infusion   Intravenous Continuous Claud Kelp, MD 100 mL/hr at 07/18/17 0457    . methocarbamol (ROBAXIN) tablet 500 mg  500 mg Oral Q6H PRN Claud Kelp, MD      . mometasone-formoterol [facility]) 200-5 MCG/ACT inhaler 2 puff  2 puff Inhalation BID Albertine Grates, MD   2 puff at 07/18/17 0845  . multivitamin liquid 15 mL  15 mL Oral Daily Marinda Elk, MD   15 mL at 07/18/17 1015  . ondansetron (ZOFRAN-ODT) disintegrating tablet 4 mg  4 mg Oral Q6H PRN Claud Kelp, MD       Or  . ondansetron [facility]) injection 4 mg  4 mg Intravenous Q6H PRN Claud Kelp, MD   4 mg at 07/18/17 0456  . pantoprazole (PROTONIX) injection 40 mg  40 mg Intravenous Q12H Albertine Grates, MD   40 mg at 07/18/17 0958  . promethazine (PHENERGAN) injection 6.25 mg  6.25 mg Intravenous Q6H PRN Albertine Grates, MD   6.25 mg at 07/18/17 0001     REVIEW OF SYSTEMS: On review of systems, the patient reports that he is doing okay since his surgery but is still struggling with nausea. He denies any chest pain, shortness of breath, cough, fevers, chills, night sweats.. he denies any bowel or bladder disturbances, and denies abdominal pain. He denies any new musculoskeletal or joint aches or pains. A complete review of systems is also per HPI and other than documented there is obtained and is otherwise negative.     PHYSICAL EXAM:  Wt Readings from Last 3 Encounters:  07/17/17 131 lb 9.8 oz (59.7 kg)  06/29/17 126 lb (57.2 kg)    01/04/17 160 lb 12.8 oz (72.9 kg)   Temp Readings from Last 3 Encounters:  06/29/17 97.6 F (36.4 C) (Oral)  01/04/17 97.7 F (36.5 C) (Oral)   BP Readings from Last 3 Encounters:  07/17/17 (!) 182/102  06/29/17 138/80  01/04/17 (!) 188/82   Pulse Readings from Last 3 Encounters:  07/17/17 97  06/29/17 (!) 118  01/04/17 92   Pain Assessment Pain Score: 7 /10  In general this is a chronically ill appearing caucasian male in no acute distress. He is alert and oriented x4 and appropriate  throughout the examination. HEENT reveals that the patient is normocephalic, atraumatic. EOMs are intact. PERRLA. Skin is intact without any evidence of gross lesions. Cardiovascular exam reveals a regular rate and rhythm, no clicks rubs or murmurs are auscultated. Chest is clear to auscultation bilaterally. Lymphatic assessment is performed and does not reveal any adenopathy in the cervical, supraclavicular, axillary, or inguinal chains. Abdomen has active bowel sounds in all quadrants and is intact. The abdomen is soft, non tender, non distended. Lower extremities are negative for pretibial pitting edema, deep calf tenderness, cyanosis or clubbing.   ECOG = 2  0 - Asymptomatic (Fully active, able to carry on all predisease activities without restriction)  1 - Symptomatic but completely ambulatory (Restricted in physically strenuous activity but ambulatory and able to carry out work of a light or sedentary nature. For example, light housework, office work)  2 - Symptomatic, <50% in bed during the day (Ambulatory and capable of all self care but unable to carry out any work activities. Up and about more than 50% of waking hours)  3 - Symptomatic, >50% in bed, but not bedbound (Capable of only limited self-care, confined to bed or chair 50% or more of waking hours)  4 - Bedbound (Completely disabled. Cannot carry on any self-care. Totally confined to bed or chair)  5 - Death   Santiago Glad MM, Creech RH,  Tormey DC, et al. [phone]). "Toxicity and response criteria of the [facility]". Am. Evlyn Clines. Oncol. 5 (6): 649-55    LABORATORY DATA:  Lab Results  Component Value Date   WBC 11.9 (H) 07/17/2017   HGB 14.1 07/17/2017   HCT 43.2 07/17/2017   MCV 88.9 07/17/2017   PLT 193 07/17/2017   Lab Results  Component Value Date   NA 138 07/17/2017   K 4.0 07/17/2017   CL 101 07/17/2017   CO2 27 07/17/2017   Lab Results  Component Value Date   ALT 21 07/12/2017   AST 32 07/12/2017   ALKPHOS 87 07/12/2017   BILITOT 0.6 07/12/2017      RADIOGRAPHY: Ct Abdomen Pelvis Wo Contrast  Result Date: 07/08/2017 CLINICAL DATA:  72 year old male with nausea vomiting. EXAM: CT CHEST, ABDOMEN AND PELVIS WITHOUT CONTRAST TECHNIQUE: Multidetector CT imaging of the chest, abdomen and pelvis was performed following the standard protocol without IV contrast. COMPARISON:  Radiograph of the chest abdomen pelvis dated 07/07/2017 FINDINGS: Evaluation of this exam is limited in the absence of intravenous contrast. CT CHEST FINDINGS Cardiovascular: There is no cardiomegaly. Coronary vascular calcification primarily involving the LAD. Small anterior pericardial effusion measuring 8 mm in thickness. There is mild atherosclerotic calcification of the thoracic aorta. The thoracic aorta and central pulmonary arteries are grossly unremarkable on this noncontrast CT. Mediastinum/Nodes: No hilar or mediastinal adenopathy. The esophagus contains a mixed density fluid content with areas of higher attenuation, likely residual oral contrast. Findings likely represent reflux or esophageal dysmotility and delayed emptying. Blood product is less likely. Clinical correlation is recommended. Endoscopy may provide better evaluation of the esophagus if clinically indicated. Lungs/Pleura: There is emphysematous changes of the lungs. Patchy area of nodular airspace density primarily involving the right middle lobe and  anterior right upper lobe most consistent with pneumonia. Clinical correlation and follow-up to resolution recommended. There is mild bronchiectatic changes. No pneumothorax or pleural effusion. Endobronchial content in the region of the carina and proximal portion of the left mainstem bronchus likely mucous plugging. Musculoskeletal: There is degenerative changes of the spine. Old  healed left rib fractures. No acute osseous pathology. CT ABDOMEN PELVIS FINDINGS No intra-abdominal free air or free fluid. Hepatobiliary: The liver is unremarkable. No intrahepatic biliary ductal dilatation. There is probable sludge or small stones within the gallbladder. No pericholecystic fluid or evidence of acute cholecystitis by CT. Pancreas: Unremarkable. No pancreatic ductal dilatation or surrounding inflammatory changes. Spleen: Normal in size without focal abnormality. Adrenals/Urinary Tract: The adrenal glands are unremarkable. Multiple bilateral renal hypodense lesions are not characterized on this CT but appear to demonstrate fluid attenuation. Ultrasound may provide better characterisation. Punctate nonobstructing right renal calculi versus vascular calcification. There is no hydronephrosis or obstructing stone on either side. The visualized ureters and urinary bladder appear unremarkable. Stomach/Bowel: There is a small hiatal hernia. Oral contrast opacifies multiple loops of small bowel. There is no evidence of bowel obstruction or active inflammation. There is sigmoid diverticulosis without active inflammation. Normal appendix. Vascular/Lymphatic: Advanced aortoiliac atherosclerotic disease. The IVC is grossly unremarkable. No portal venous gas. There is no adenopathy. Reproductive: The prostate and seminal vesicles are grossly unremarkable. Other: None Musculoskeletal: Osteopenia with degenerative changes of the lower lumbar spine. No acute osseous pathology. IMPRESSION: 1. Patchy areas of nodular airspace density  primarily in the right middle lobe and right upper lobe most consistent with pneumonia. Clinical correlation and follow-up to resolution after treatment recommended. 2. Emphysema.  No pleural effusion or pneumothorax. 3. Mucous plugging in the proximal left mainstem bronchus. 4. Sigmoid diverticulosis. No bowel obstruction or active inflammation. Normal appendix. 5. Bilateral renal hypodense lesions, incompletely characterized. Ultrasound may provide better evaluation. No hydronephrosis or obstructing stone. 6. Aortic Atherosclerosis (ICD10-I70.0) and Emphysema (ICD10-J43.9). Electronically Signed   By: Elgie Collard M.D.   On: 07/08/2017 05:47   Ct Chest Wo Contrast  Result Date: 07/08/2017 CLINICAL DATA:  72 year old male with nausea vomiting. EXAM: CT CHEST, ABDOMEN AND PELVIS WITHOUT CONTRAST TECHNIQUE: Multidetector CT imaging of the chest, abdomen and pelvis was performed following the standard protocol without IV contrast. COMPARISON:  Radiograph of the chest abdomen pelvis dated 07/07/2017 FINDINGS: Evaluation of this exam is limited in the absence of intravenous contrast. CT CHEST FINDINGS Cardiovascular: There is no cardiomegaly. Coronary vascular calcification primarily involving the LAD. Small anterior pericardial effusion measuring 8 mm in thickness. There is mild atherosclerotic calcification of the thoracic aorta. The thoracic aorta and central pulmonary arteries are grossly unremarkable on this noncontrast CT. Mediastinum/Nodes: No hilar or mediastinal adenopathy. The esophagus contains a mixed density fluid content with areas of higher attenuation, likely residual oral contrast. Findings likely represent reflux or esophageal dysmotility and delayed emptying. Blood product is less likely. Clinical correlation is recommended. Endoscopy may provide better evaluation of the esophagus if clinically indicated. Lungs/Pleura: There is emphysematous changes of the lungs. Patchy area of nodular  airspace density primarily involving the right middle lobe and anterior right upper lobe most consistent with pneumonia. Clinical correlation and follow-up to resolution recommended. There is mild bronchiectatic changes. No pneumothorax or pleural effusion. Endobronchial content in the region of the carina and proximal portion of the left mainstem bronchus likely mucous plugging. Musculoskeletal: There is degenerative changes of the spine. Old healed left rib fractures. No acute osseous pathology. CT ABDOMEN PELVIS FINDINGS No intra-abdominal free air or free fluid. Hepatobiliary: The liver is unremarkable. No intrahepatic biliary ductal dilatation. There is probable sludge or small stones within the gallbladder. No pericholecystic fluid or evidence of acute cholecystitis by CT. Pancreas: Unremarkable. No pancreatic ductal dilatation or surrounding inflammatory changes. Spleen: Normal  in size without focal abnormality. Adrenals/Urinary Tract: The adrenal glands are unremarkable. Multiple bilateral renal hypodense lesions are not characterized on this CT but appear to demonstrate fluid attenuation. Ultrasound may provide better characterisation. Punctate nonobstructing right renal calculi versus vascular calcification. There is no hydronephrosis or obstructing stone on either side. The visualized ureters and urinary bladder appear unremarkable. Stomach/Bowel: There is a small hiatal hernia. Oral contrast opacifies multiple loops of small bowel. There is no evidence of bowel obstruction or active inflammation. There is sigmoid diverticulosis without active inflammation. Normal appendix. Vascular/Lymphatic: Advanced aortoiliac atherosclerotic disease. The IVC is grossly unremarkable. No portal venous gas. There is no adenopathy. Reproductive: The prostate and seminal vesicles are grossly unremarkable. Other: None Musculoskeletal: Osteopenia with degenerative changes of the lower lumbar spine. No acute osseous  pathology. IMPRESSION: 1. Patchy areas of nodular airspace density primarily in the right middle lobe and right upper lobe most consistent with pneumonia. Clinical correlation and follow-up to resolution after treatment recommended. 2. Emphysema.  No pleural effusion or pneumothorax. 3. Mucous plugging in the proximal left mainstem bronchus. 4. Sigmoid diverticulosis. No bowel obstruction or active inflammation. Normal appendix. 5. Bilateral renal hypodense lesions, incompletely characterized. Ultrasound may provide better evaluation. No hydronephrosis or obstructing stone. 6. Aortic Atherosclerosis (ICD10-I70.0) and Emphysema (ICD10-J43.9). Electronically Signed   By: Elgie Collard M.D.   On: 07/08/2017 05:47   Dg Esophagus  Result Date: 07/11/2017 CLINICAL DATA:  Persistent dysphagia, nausea/vomiting, unable to tolerate more than a tablespoon of food at a time, decreased appetite EXAM: ESOPHOGRAM/BARIUM SWALLOW TECHNIQUE: Single contrast examination was performed using  thin barium. FLUOROSCOPY TIME:  Fluoroscopy Time:  3.1 minutes Radiation Exposure Index (if provided by the fluoroscopic device): 1.9 mGy Number of Acquired Spot Images: 5 COMPARISON:  CT chest abdomen pelvis dated 07/08/2017 FINDINGS: Moderate esophageal dysmotility with fluid/debris in the mid/distal esophagus. Marked narrowing/stricture of the distal esophagus just above the GE junction. After significant delay, trace contrast passed into the proximal stomach. When correlating with the recent CT, there is no evidence of esophageal mass. However, a fat containing hernia at the esophageal hiatus is present, possibly leading to extrinsic compression. IMPRESSION: Severe stricture of the distal esophagus just above the GE junction. Secondary esophageal dysmotility. When correlating with recent CT, there is no evidence of esophageal mass. Possible extrinsic compression secondary to a fat containing hernia. Consider endoscopy for further  evaluation. Electronically Signed   By: Charline Bills M.D.   On: 07/11/2017 10:21   US Renal  Result Date: 07/09/2017 CLINICAL DATA:  Elevated creatinine EXAM: RENAL / URINARY TRACT ULTRASOUND COMPLETE COMPARISON:  CT 07/08/2017 FINDINGS: Right Kidney: Length: 10.6 cm. Echogenicity within normal limits. No mass or hydronephrosis visualized. Left Kidney: Length: 10.3 cm. Multiple cysts in the left kidney, the largest 4.5 cm. These appear benign. No hydronephrosis. Bladder: Appears normal for degree of bladder distention. IMPRESSION: Left renal cysts. No acute findings.  No hydronephrosis. Electronically Signed   By: Charlett Nose M.D.   On: 07/09/2017 15:56   Dg Abd Acute W/chest  Result Date: 07/08/2017 CLINICAL DATA:  Vomiting for 10 days.  Weight loss. EXAM: DG ABDOMEN ACUTE W/ 1V CHEST COMPARISON:  Chest radiograph 01/13/2016 FINDINGS: The lungs are hyperexpanded with multiple areas of increased lucency. No focal airspace consolidation or pulmonary edema. No pneumothorax or sizable pleural effusion. No free intraperitoneal air. No dilated loops of bowel are visible. The round densities overlying the left psoas muscle may be within the transverse colon. IMPRESSION: 1. COPD  without acute airspace disease. 2. No free intraperitoneal air or evidence of small-bowel obstruction. Electronically Signed   By: Deatra Robinson M.D.   On: 07/08/2017 00:07       IMPRESSION/PLAN: 1. Incompletely staged adenocarcinoma of the esophagus.  I met with the patient today to review the findings and workup thus far.  He understands that based on NCC and guidelines, we would anticipate a course of concurrent chemotherapy with radiation with the hopes that he might be a candidate for esophagectomy following neoadjuvant treatment.  We discussed the risks, benefits, short and long-term effects of treatment, we also discussed the logistics and delivery, and would anticipate a 5-1/2-6-week course of radiotherapy along with  concurrent chemo.  He is in agreement with this.  To complete his staging workup, we will order a PET scan once he is discharged from the hospital.  He is in agreement with this plan. 2. Aspiration pneumonia.  The patient will continue his enteral nutrition, and we will defer to the nutrition team about the safety of him taking an oral nutrition as well as liquids. 3. Hypertension. The patient is being followed by the inpatient service and we will follow along with this.   In a visit lasting 70 minutes, greater than 50% of the time was spent face to face discussing his case and in floor time, coordinating the patient's care.     Osker Mason, PAC

## 2017-07-18 NOTE — Progress Notes (Addendum)
Patient vomited between 50 - 100 mL yellow liquid emesis, this was approx three hours after his Jevity tube feeding was started at 10 mL/Hr

## 2017-07-18 NOTE — Progress Notes (Signed)
Nutrition Follow-up  DOCUMENTATION CODES:   Non-severe (moderate) malnutrition in context of chronic illness  INTERVENTION:   Patient at risk of refeeding syndrome. Once TF begins, please monitor magnesium, potassium, and phosphorus daily for at least 3 days, MD to replete as needed.  -Re-initiate Osmolite 1.5 @ 10 ml/hr, advance by 10 ml every 24 hours towards goal rate of 60 ml/hr. -Goal rate provides 2160 kcal, 90g protein and 1097 ml H2O. -Once IVF stopped, recommend free water of 200 ml QID (800 ml)  NUTRITION DIAGNOSIS:   Moderate Malnutrition related to chronic illness, nausea, vomiting(ETOH abuse) as evidenced by percent weight loss, energy intake < or equal to 75% for > or equal to 1 month, moderate fat depletion, moderate muscle depletion.  Ongoing.  GOAL:   Patient will meet greater than or equal to 90% of their needs  Not meeting.  MONITOR:   PO intake, Labs, Weight trends, TF tolerance, I & O's  REASON FOR ASSESSMENT:   Consult Enteral/tube feeding initiation and management  ASSESSMENT:   72 y.o. male with medical history significant of COPD, CRF on 4 liters he thinks at home, htn comes in with over a week of nausea and vomiting with no abdominal pain or blood in vomit.  No fevers.  No diarrhea. No cough.  No chest pain.  He says he gets nauseated even after drinking liquids.  Pt referred for admission for report of wt loss and his n/v.  Patient in room with brother at bedside. Pt reports continued nausea and vomiting. Describes emesis as "clear and frothy". States he has noticed bleeding around tube site, RN was notified in room. Tube feeding currently stopped. Per RN, MD would like Osmolite 1.5 reinitiated at 10 ml/hr over 24 hours. RD to place order. Goal rate remains the same, 60 ml/hr.  Weight is currently +11 lb since admission on 1/10.   Labs reviewed. Medications: Augmentin suspension every 8 hours, Liquid MVI daily, IV Zofran PRN, IV Phenergan  PRN  Diet Order:  Diet clear liquid Room service appropriate? Yes; Fluid consistency: Thin  EDUCATION NEEDS:   Education needs have been addressed  Skin:  Skin Assessment: Reviewed RN Assessment  Last BM:  1/17  Height:   Ht Readings from Last 1 Encounters:  07/13/17 5' 7.25" (1.708 m)    Weight:   Wt Readings from Last 1 Encounters:  07/17/17 131 lb 9.8 oz (59.7 kg)    Ideal Body Weight:  67.3 kg  BMI:  Body mass index is 20.46 kg/m.  Estimated Nutritional Needs:   Kcal:  1900-2100  Protein:  90-100g  Fluid:  2L/day  [name], MS, RD, LDN [place] [facility] Pager: [phone] After Hours Pager: [phone]

## 2017-07-18 NOTE — Anesthesia Postprocedure Evaluation (Signed)
Anesthesia Post Note  Patient: George Stokes  Procedure(s) Performed: FEEDING JEJUNOSTOMY TUBE PLACEMENT (N/A Abdomen)     Patient location during evaluation: PACU Anesthesia Type: General Level of consciousness: awake and alert Pain management: pain level controlled Vital Signs Assessment: post-procedure vital signs reviewed and stable Respiratory status: spontaneous breathing, nonlabored ventilation, respiratory function stable and patient connected to nasal cannula oxygen Cardiovascular status: blood pressure returned to baseline and stable Postop Assessment: no apparent nausea or vomiting Anesthetic complications: no    Last Vitals:  Vitals:   07/17/17 2108 07/17/17 2135  BP:  (!) 182/102  Pulse:  97  Resp:  16  Temp:  36.7 C  SpO2: 92% 97%    Last Pain:  Vitals:   07/18/17 0533  TempSrc:   PainSc: 7                  Danni Leabo

## 2017-07-19 ENCOUNTER — Inpatient Hospital Stay (HOSPITAL_COMMUNITY): Payer: Medicare Other

## 2017-07-19 ENCOUNTER — Encounter: Payer: Self-pay | Admitting: General Practice

## 2017-07-19 LAB — GLUCOSE, CAPILLARY
GLUCOSE-CAPILLARY: 104 mg/dL — AB (ref 65–99)
GLUCOSE-CAPILLARY: 69 mg/dL (ref 65–99)
Glucose-Capillary: 106 mg/dL — ABNORMAL HIGH (ref 65–99)
Glucose-Capillary: 75 mg/dL (ref 65–99)
Glucose-Capillary: 76 mg/dL (ref 65–99)
Glucose-Capillary: 92 mg/dL (ref 65–99)

## 2017-07-19 MED ORDER — HYDRALAZINE HCL 20 MG/ML IJ SOLN
10.0000 mg | Freq: Four times a day (QID) | INTRAMUSCULAR | Status: DC | PRN
  Administered 2017-07-19 – 2017-07-20 (×2): 10 mg via INTRAVENOUS
  Filled 2017-07-19 (×3): qty 1

## 2017-07-19 MED ORDER — LABETALOL HCL 5 MG/ML IV SOLN
5.0000 mg | INTRAVENOUS | Status: DC | PRN
  Filled 2017-07-19: qty 4

## 2017-07-19 MED ORDER — IOPAMIDOL (ISOVUE-300) INJECTION 61%
INTRAVENOUS | Status: AC
  Filled 2017-07-19: qty 50

## 2017-07-19 MED ORDER — DEXTROSE-NACL 5-0.45 % IV SOLN
INTRAVENOUS | Status: DC
  Administered 2017-07-19: 19:00:00 via INTRAVENOUS

## 2017-07-19 MED ORDER — SODIUM CHLORIDE 0.9 % IV SOLN
INTRAVENOUS | Status: DC
  Administered 2017-07-19 (×2): via INTRAVENOUS

## 2017-07-19 MED ORDER — HALOPERIDOL LACTATE 5 MG/ML IJ SOLN
2.0000 mg | Freq: Four times a day (QID) | INTRAMUSCULAR | Status: DC | PRN
  Administered 2017-07-19: 2 mg via INTRAVENOUS
  Filled 2017-07-19: qty 1

## 2017-07-19 NOTE — Progress Notes (Signed)
[facility] Progress Note  4 Days Post-Op  Subjective: CC-  Called to room by nurse due to J tube leaking. States that he was tolerating J tube feedings yesterday, they were held this morning because he got nauseated. Patient states that sometime this morning he got tangled in his lines and the J tube was pulled. It is still attached via suture placed during surgery, but now he is having drainage from around the tube. Initially the drainage was bloody, now it is serosanguinous. Patient denies increased abdominal pain. He does not appear acutely ill.  Objective: Vital signs in last 24 hours: Temp:  [97.8 F (36.6 C)-98 F (36.7 C)] 98 F (36.7 C) (01/22 1517) Pulse Rate:  [100-147] 118 (01/22 1517) Resp:  [18-20] 18 (01/22 1517) BP: (159-185)/(111-122) 185/122 (01/22 1517) SpO2:  [91 %-97 %] 93 % (01/22 1517) Last BM Date: 07/14/17  Intake/Output from previous day: 01/21 0701 - 01/22 0700 In: 3435 [I.V.:3435] Out: 675 [Urine:675] Intake/Output this shift: Total I/O In: -  Out: 100 [Urine:100]  PE: Gen:  Alert, NAD, pleasant HEENT: EOM's intact, pupils equal and round Card:  tachycardic Pulm:  effort normal Abd: Soft, mild distension, nontender, +BS, J-tube site with slow serosanguinous drainage Psych: A&Ox3  Skin: no rashes noted, warm and dry  Lab Results:  Recent Labs    07/17/17 0253  WBC 11.9*  HGB 14.1  HCT 43.2  PLT 193   BMET Recent Labs    07/17/17 0253  NA 138  K 4.0  CL 101  CO2 27  GLUCOSE 113*  BUN 10  CREATININE 1.02  CALCIUM 8.7*   PT/INR No results for input(s): LABPROT, INR in the last 72 hours. CMP     Component Value Date/Time   NA 138 07/17/2017 0253   NA 143 06/29/2017 1508   K 4.0 07/17/2017 0253   CL 101 07/17/2017 0253   CO2 27 07/17/2017 0253   GLUCOSE 113 (H) 07/17/2017 0253   BUN 10 07/17/2017 0253   BUN 28 (H) 06/29/2017 1508   CREATININE 1.02 07/17/2017 0253   CREATININE 1.07 05/01/2015 1508   CALCIUM  8.7 (L) 07/17/2017 0253   PROT 6.6 07/12/2017 0342   PROT 7.3 06/29/2017 1508   ALBUMIN 3.7 07/12/2017 0342   ALBUMIN 4.5 06/29/2017 1508   AST 32 07/12/2017 0342   ALT 21 07/12/2017 0342   ALKPHOS 87 07/12/2017 0342   BILITOT 0.6 07/12/2017 0342   BILITOT 0.6 06/29/2017 1508   GFRNONAA >60 07/17/2017 0253   GFRNONAA 71 05/01/2015 1508   GFRAA >60 07/17/2017 0253   GFRAA 83 05/01/2015 1508   Lipase     Component Value Date/Time   LIPASE 39 07/12/2017 0342       Studies/Results: Dg Chest Port 1 View  Result Date: 07/19/2017 CLINICAL DATA:  Shortness of breath. EXAM: PORTABLE CHEST 1 VIEW COMPARISON:  CT 07/08/2016.  Chest x-ray 01/13/2016. FINDINGS: Mediastinum hilar structures normal. Cardiomegaly with normal pulmonary vascularity. COPD. Right base infiltrate with small right pleural effusion. IMPRESSION: 1. Right base infiltrate consistent with pneumonia. Small right pleural effusion. Similar findings noted on prior CT of 07/08/2016. 2.  COPD. Electronically Signed   By: Marcello Moores  Register   On: 07/19/2017 13:51   Dg Abd Portable 1v  Result Date: 07/19/2017 CLINICAL DATA:  COPD, nausea vomiting EXAM: PORTABLE ABDOMEN - 1 VIEW COMPARISON:  None. FINDINGS: No dilated large or small bowel. Jejunostomy tube with tip in the RIGHT lower quadrant. Midline skin staples noted.  No evidence bowel obstruction. Oral contrast from prior CT within the colon IMPRESSION: 1. No evidence of bowel obstruction. 2. Jejunostomy tube in place. Electronically Signed   By: Suzy Bouchard M.D.   On: 07/19/2017 10:34    Anti-infectives: Anti-infectives (From admission, onward)   Start     Dose/Rate Route Frequency Ordered Stop   07/16/17 0000  ceFAZolin (ANCEF) IVPB 2g/100 mL premix     2 g 200 mL/hr over 30 Minutes Intravenous Every 8 hours 07/15/17 1833 07/16/17 0054   07/15/17 0945  amoxicillin-clavulanate (AUGMENTIN) 250-62.5 MG/5ML suspension 500 mg     500 mg Oral Every 8 hours 07/15/17 0938  07/19/17 0928   07/15/17 0600  ceFAZolin (ANCEF) IVPB 2g/100 mL premix     2 g 200 mL/hr over 30 Minutes Intravenous On call to O.R. 07/14/17 1809 07/15/17 1632   07/11/17 1600  ampicillin-sulbactam (UNASYN) 1.5 g in sodium chloride 0.9 % 50 mL IVPB  Status:  Discontinued     1.5 g 100 mL/hr over 30 Minutes Intravenous Every 6 hours 07/11/17 1329 07/15/17 0940   07/08/17 1000  cefTRIAXone (ROCEPHIN) 1 g in dextrose 5 % 50 mL IVPB  Status:  Discontinued     1 g 100 mL/hr over 30 Minutes Intravenous Every 24 hours 07/08/17 0954 07/11/17 1315   07/08/17 1000  azithromycin (ZITHROMAX) 500 mg in dextrose 5 % 250 mL IVPB  Status:  Discontinued     500 mg 250 mL/hr over 60 Minutes Intravenous Every 24 hours 07/08/17 0954 07/11/17 1315       Assessment/Plan COPD oxygen dependent Alcohol abuse Malnutrition  Malignant esophageal stenosis/adenocarcinoma of the esophagus S/p Feeding jejunostomy 1/18 Dr. Dalbert Batman - POD 4 - Will obtain an abdominal xray and inject water soluble contrast into J tube to check for accurate placement of J tube. Continue to hold tube feedings for now.  ID - none currently FEN - IVF, TF (on hold) VTE - lovenox Foley - none Follow up - Dr. Dalbert Batman   LOS: 11 days    Wellington Hampshire , [facility] 07/19/2017, 3:50 PM Pager: [phone] Consults: [phone] Mon-Fri 7:00 am-4:30 pm Sat-Sun 7:00 am-11:30 am

## 2017-07-19 NOTE — Progress Notes (Signed)
   07/19/17 1200  Clinical Encounter Type  Visited With Patient and family together  Visit Type Initial;Psychological support;Spiritual support  Referral From Nurse  Consult/Referral To Chaplain  Spiritual Encounters  Spiritual Needs Other (Comment) (Advance Directive )  Stress Factors  Patient Stress Factors Other (Comment) (Advance Directive )  Family Stress Factors Other (Comment) (Advance Directive )  Advance Directives (For Healthcare)  Does Patient Have a Medical Advance Directive? No  Would patient like information on creating a medical advance directive? No - Patient declined   I visited with the patient to discuss an Advance Directive. The patient stated that he wants his next of kin, brother and sister, to make his healthcare decisions if he is unable to. Considering they are protected by law he decided not to complete the document at this time.   Please, contact Spiritual Care for further assistance.   Chaplain Shanon Ace M.Div., [facility]

## 2017-07-19 NOTE — Progress Notes (Signed)
[facility] PROGRESS NOTE    Progress Note  George Stokes  NWG:[id number] DOB: 1946-03-08 DOA: 07/07/2017 PCP: Patient, No Pcp Per     Brief Narrative:   George Stokes is an 72 y.o. male past medical history significant for COPD on home oxygen comes for 1 week of nausea vomiting he denies any abdominal pain or hematemesis no diarrhea.  Biopsy showed appearing esophageal stenosis biopsy, with atypical cells suspicious for adenocarcinoma  Assessment/Plan:   Intractable nausea and vomiting due to malignant esophageal stenosis/adenocarcinoma of the esophagus: CT scan of the abdomen pelvis showed no acute findings. GI was consulted recommended an EGD performed on 07/13/2016. Consulted general surgery, and feeding jejunostomy tube placed on 07/15/2017. CEA 2.8. He Started having nausea & vomiting this morning we will place him n.p.o. we will stop his tube feedings,  will get a KUB placed on n.p.o. Start Normal saline hydration.  Likely aspiration pneumonia: From intractable nausea and vomiting Previous treatment tomorrow.  Acute kidney injury: Likely prerenal in etiology resolved with IV fluid hydration.  Hypokalemia/hypomagnesemia: Repleted orally now stable.  Bilateral renal hypodense lesions seen on CT: [place] confirmed renal cyst  COPD oxygen dependent: No wheezing stable, on oxygen.  History of gout:  Alcohol use: No signs of withdrawal.  Malnutrition of moderate degree Likely due to alcohol abuse in the setting of malignant esophageal stenosis.  Ensure 3 times daily. Nutritional feeds which are causing him to be nauseated. Nutritionist consulted recommended to restart the feedings and increase every 24 hours.  Acute confusional state: Likely due to Phenergan which was used for his extreme nausea and vomiting. DC Phenergan and start him on Zofran.  DVT prophylaxis: lovenox Family Communication:none Disposition Plan/Barrier to D/C: unable to determine Code Status:       Code Status Orders  (From admission, onward)        Start     Ordered   07/08/17 0955  Full code  Continuous     07/08/17 0954    Code Status History    Date Active Date Inactive Code Status Order ID Comments User Context   This patient has a current code status but no historical code status.        IV Access:    Peripheral IV   Procedures and diagnostic studies:   No results found.   Medical Consultants:    None.  Anti-Infectives:   None  Subjective:    George Stokes he was throwing up this morning.   Objective:    Vitals:   07/17/17 2135 07/18/17 1442 07/18/17 2010 07/18/17 2106  BP: (!) 182/102 (!) 175/110 (!) 159/111   Pulse: 97 98 100   Resp: 16 18 20    Temp:  97.9 F (36.6 C) 97.8 F (36.6 C)   TempSrc: Oral Oral Oral   SpO2:  94% 97% 94%  Weight:      Height:        Intake/Output Summary (Last 24 hours) at 07/19/2017 0924 Last data filed at 07/19/2017 0600 Gross per 24 hour  Intake 3435 ml  Output 675 ml  Net 2760 ml   Filed Weights   07/13/17 0922 07/16/17 1100 07/17/17 1900  Weight: 54.4 kg (120 lb) 59.7 kg (131 lb 9.8 oz) 59.7 kg (131 lb 9.8 oz)    Exam: General exam: In no acute distress, cachectic appearing. Respiratory system: Good air movement and clear to auscultation. Cardiovascular system: S1 & S2 heard, RRR.  Gastrointestinal system: Abdomen is nondistended, soft and nontender.  Central nervous system: Alert and oriented. No focal neurological deficits. Extremities: No pedal edema. Skin: No rashes, lesions or ulcers    Data Reviewed:    Labs: Basic Metabolic Panel: Recent Labs  Lab 07/13/17 0323 07/15/17 1922 07/16/17 0348 07/17/17 0253  NA 137  --  137 138  K 3.5  --  4.2 4.0  CL 96*  --  100* 101  CO2 31  --  24 27  GLUCOSE 74  --  123* 113*  BUN 5*  --  8 10  CREATININE 0.99 1.00 1.09 1.02  CALCIUM 8.8*  --  8.2* 8.7*   GFR Estimated Creatinine Clearance: 57.7 mL/min (by C-G formula based  on SCr of 1.02 mg/dL). Liver Function Tests: No results for input(s): AST, ALT, ALKPHOS, BILITOT, PROT, ALBUMIN in the last 168 hours. No results for input(s): LIPASE, AMYLASE in the last 168 hours. No results for input(s): AMMONIA in the last 168 hours. Coagulation profile No results for input(s): INR, PROTIME in the last 168 hours.  CBC: Recent Labs  Lab 07/15/17 1922 07/16/17 0348 07/17/17 0253  WBC 23.4* 21.1* 11.9*  HGB 15.7 13.7 14.1  HCT 46.2 41.7 43.2  MCV 89.4 89.5 88.9  PLT 246 203 193   Cardiac Enzymes: No results for input(s): CKTOTAL, CKMB, CKMBINDEX, TROPONINI in the last 168 hours. BNP (last 3 results) No results for input(s): PROBNP in the last 8760 hours. CBG: Recent Labs  Lab 07/18/17 0747 07/18/17 1143 07/18/17 1726 07/18/17 2001 07/19/17 0020  GLUCAP 95 97 115* 115* 92   D-Dimer: No results for input(s): DDIMER in the last 72 hours. Hgb A1c: No results for input(s): HGBA1C in the last 72 hours. Lipid Profile: No results for input(s): CHOL, HDL, LDLCALC, TRIG, CHOLHDL, LDLDIRECT in the last 72 hours. Thyroid function studies: No results for input(s): TSH, T4TOTAL, T3FREE, THYROIDAB in the last 72 hours.  Invalid input(s): FREET3 Anemia work up: No results for input(s): VITAMINB12, FOLATE, FERRITIN, TIBC, IRON, RETICCTPCT in the last 72 hours. Sepsis Labs: Recent Labs  Lab 07/15/17 1922 07/16/17 0348 07/17/17 0253  WBC 23.4* 21.1* 11.9*   Microbiology No results found for this or any previous visit (from the past 240 hour(s)).   Medications:   . amoxicillin-clavulanate  500 mg Oral Q8H  . enoxaparin (LOVENOX) injection  40 mg Subcutaneous Q24H  . feeding supplement (OSMOLITE 1.5 CAL)  1,000 mL Per Tube Q24H  . guaiFENesin  30 mL Oral BID  . ipratropium-albuterol  3 mL Nebulization BID  . mometasone-formoterol  2 puff Inhalation BID  . multivitamin  15 mL Oral Daily  . pantoprazole (PROTONIX) IV  40 mg Intravenous Q12H   Continuous  Infusions: . lactated ringers 100 mL/hr at 07/19/17 0823     LOS: 11 days   Marinda Elk  [facility] Pager [phone]  *Please refer to [email or web address], password [email or web address] to get updated schedule on who will round on this patient, as hospitalists switch teams weekly. If 7PM-7AM, please contact night-coverage at [email or web address], password [email or web address] for any overnight needs.  07/19/2017, 9:24 AM

## 2017-07-19 NOTE — Care Management Important Message (Signed)
Important Message  Patient Details  Name: George Stokes MRN: [id number] Date of Birth: Feb 28, 1946   Medicare Important Message Given:  Yes    Kerin Salen 07/19/2017, 11:31 AMImportant Message  Patient Details  Name: George Stokes MRN: [id number] Date of Birth: 09/27/1945   Medicare Important Message Given:  Yes    Kerin Salen 07/19/2017, 11:31 AM

## 2017-07-19 NOTE — Progress Notes (Signed)
Nutrition Follow-up  DOCUMENTATION CODES:   Non-severe (moderate) malnutrition in context of chronic illness  INTERVENTION:   Patient at risk of refeeding syndrome. Once TF begins, please monitor magnesium, potassium, and phosphorus daily for at least 3 days, MD to replete as needed.  -Recommend resume Osmolite 1.5 @ 10 ml/hr when medically appropriate. Advance by 10 ml every 24 hours to goal rate of 60 ml/hr. -Once IVF stopped, recommend free water of 200 ml QID (800 ml)  NUTRITION DIAGNOSIS:   Moderate Malnutrition related to chronic illness, nausea, vomiting(ETOH abuse) as evidenced by percent weight loss, energy intake < or equal to 75% for > or equal to 1 month, moderate fat depletion, moderate muscle depletion.  Ongoing.  GOAL:   Patient will meet greater than or equal to 90% of their needs  Not meeting.  MONITOR:   PO intake, Labs, Weight trends, TF tolerance, I & O's  REASON FOR ASSESSMENT:   Consult Enteral/tube feeding initiation and management  ASSESSMENT:   72 y.o. male with medical history significant of COPD, CRF on 4 liters he thinks at home, htn comes in with over a week of nausea and vomiting with no abdominal pain or blood in vomit.  No fevers.  No diarrhea. No cough.  No chest pain.  He says he gets nauseated even after drinking liquids.  Pt referred for admission for report of wt loss and his n/v.  Events: 1/13: Pt reports poor intakes for 10 days PTA. Was having N/V. 1/14: DG of esophagus shows esophageal stricture 1/16: EGD reveals malignant esophageal mass 1/17: biopsies confirm adenocarcinoma per GI note 1/18: J-tube placed 1/19: TF initiated, Osmolite 1.5 @ 20 ml/hr 1/20: TF stopped after episodes of N/V, Osm 1.5 restarted at 30 ml/hr, then stopped again.  Patient with TF stopped d/t N/V. Per patient and chart review, pt's emesis was a yellow color. Pt consumed some coffee and states his emesis was brown afterwards. No tan or beige colors  reported that would indicate tube feeding formula. Per RN notes, pt received Osmolite 1.5 @ 10 ml/hr x 3 hours (total 30 ml). Not significant amount to indicate this is causing N/V. Pt reports he was having episodes even when TF was turned off.   DG of abdomen reveals no bowel obstruction and J-tube in place.  Labs reviewed. Medications: Liquid MVI daily, IV Protonix daily, Lactated Ringers infusion, IV Zofran PRN  Diet Order:  Diet NPO time specified Except for: Ice Chips, Sips with Meds  EDUCATION NEEDS:   Education needs have been addressed  Skin:  Skin Assessment: Reviewed RN Assessment  Last BM:  1/17  Height:   Ht Readings from Last 1 Encounters:  07/13/17 5' 7.25" (1.708 m)    Weight:   Wt Readings from Last 1 Encounters:  07/17/17 131 lb 9.8 oz (59.7 kg)    Ideal Body Weight:  67.3 kg  BMI:  Body mass index is 20.46 kg/m.  Estimated Nutritional Needs:   Kcal:  1900-2100  Protein:  90-100g  Fluid:  2L/day  [name], MS, RD, LDN [place] [facility] Pager: [phone] After Hours Pager: [phone]

## 2017-07-19 NOTE — Progress Notes (Signed)
Dr Philippa Chester ,O notified of BS [phone]

## 2017-07-19 NOTE — Progress Notes (Signed)
[place] CSW Progress Note  Referral received from Shona Simpson - patient needs help w transportation to appointments.  CSW spoke w patient in hospital.  States that "me and some of my friends can handle it" at this point. CSW reviewed available resources including [employer], [place] and [facility].  Patient wants to talk about these resources when he is out of the hospital.   CSW offered to call brother, patient did not have correct contact information for brother.  Michela Pitcher he would ask brother to call CSW when he is back in the hospital room.    Edwyna Shell, LCSW Clinical Social Worker Phone:  [phone]

## 2017-07-19 NOTE — Progress Notes (Signed)
I was called by pt he said " something has gotten pulled on ,I'm bleeding. I found blood oozing from his feeding tube. Part blood then serous fluid. I called Dr Karolee Stamps he came to the room immediately.a dry dressing of 4 split gauze  And 2 ABD pads applied and  Secures with gauze tape.KUB was ordered and performed.Chest eray    1212 Dressing  over feeding tube saturated with serosanguinous fluid soaking the dressings. Area cleaned , redressed with 6 spit gauze and 6 ABD pads . Dr Olevia Bowens was called. He suggested that I call the surgeon.   Brooke PA came to the room ,access the feeding tube and processed  orders.  XRAY with contrast was done this afternoon. Dressing was saturated  Again thru all previous  . Cleaned and  Redressed with 6 spit gauze and 6 ABD pads

## 2017-07-19 NOTE — Progress Notes (Signed)
BP 185/122 . 10 MG of hydrazine given IV

## 2017-07-19 NOTE — Progress Notes (Signed)
  Dressing over feeding tube saturates with serosanguinous fluid thru 8 split gauze and 5 ABD pads. Area cleand . 4 packs of spit gauze and 6 ABD pads applied over site. Brooke PA  Has been in to access

## 2017-07-19 NOTE — Progress Notes (Signed)
Pt refused 0400 VS and 0400 CBG.

## 2017-07-19 NOTE — Progress Notes (Signed)
Physical Therapy Treatment Patient Details Name: George Stokes MRN: [id number] DOB: [birth year] Today's Date: 07/19/2017    History of Present Illness 72 y.o. male with medical history significant of COPD, CRF on 4 liters he thinks at home, htn comes in with over a week of nausea and vomiting.  EGD performed 07/13/17 found malignant appearing esophageal stenosis - biopsy sent.   Dx of PNA    PT Comments    Pt in bed on 4 lts with nasal cannula in mouth.  Brother states "yeah that's how her wears it".  Removed O2 for trial RA.  Assisted pt to EOB.  VC's for safety as pt is impulsive.  Quick to stand.  VC's to have pt sit EOB for a second gown to be applied and monitor RA.  Performed sit to stand noted HR increased to 147 and RR to 28.  RA decreased to 85%.  Assisted back to sitting EOB and reapplied O2.  HR remained in 130's to high 140"s.  Notified RN via pt's room phone that pt was tachy and will hold off on amb.  Assisted back to bed and reapplied nasal O2    Follow Up Recommendations  No PT follow up     Equipment Recommendations       Recommendations for Other Services       Precautions / Restrictions Precautions Precautions: Fall    Mobility  Bed Mobility Overal bed mobility: Needs Assistance Bed Mobility: Supine to Sit     Supine to sit: Supervision     General bed mobility comments: assist with blankets and safety with O2 line and IV line  Transfers Overall transfer level: Needs assistance Equipment used: None Transfers: Sit to/from Stand Sit to Stand: Supervision         General transfer comment: assist for safety pt impulsive   Ambulation/Gait             General Gait Details: unable to attept due to HR increased to 147 with standing.  Returned to bed and reported to RN via pt's phone.     Stairs            Wheelchair Mobility    Modified Rankin (Stroke Patients Only)       Balance                                             Cognition Arousal/Alertness: Awake/alert Behavior During Therapy: WFL for tasks assessed/performed Overall Cognitive Status: Within Functional Limits for tasks assessed                                 General Comments: impulsive     VC's for safety      Exercises      General Comments        Pertinent Vitals/Pain Pain Assessment: No/denies pain    Home Living                      Prior Function            PT Goals (current goals can now be found in the care plan section)      Frequency    Min 3X/week      PT Plan Current plan remains appropriate    Co-evaluation  AM-PAC PT "6 Clicks" Daily Activity  Outcome Measure  Difficulty turning over in bed (including adjusting bedclothes, sheets and blankets)?: A Little Difficulty moving from lying on back to sitting on the side of the bed? : A Little Difficulty sitting down on and standing up from a chair with arms (e.g., wheelchair, bedside commode, etc,.)?: None Help needed moving to and from a bed to chair (including a wheelchair)?: None Help needed walking in hospital room?: None Help needed climbing 3-5 steps with a railing? : A Little 6 Click Score: 21    End of Session Equipment Utilized During Treatment: Gait belt Activity Tolerance: Treatment limited secondary to medical complications (Comment)(tackycardia) Patient left: in bed;with call bell/phone within reach Nurse Communication: Other (comment)(HR) PT Visit Diagnosis: Difficulty in walking, not elsewhere classified (R26.2)     Time: 7829-5621 PT Time Calculation (min) (ACUTE ONLY): 12 min  Charges:  $Therapeutic Activity: 8-22 mins                    G Codes:       {Tyannah Sane  PTA [facility]  Acute  Rehab Pager      [phone]

## 2017-07-20 ENCOUNTER — Encounter (HOSPITAL_COMMUNITY): Admission: EM | Disposition: A | Discharge status: Skilled Nursing Facility | Payer: Self-pay | Source: Home / Self Care

## 2017-07-20 ENCOUNTER — Encounter (HOSPITAL_COMMUNITY): Payer: Self-pay

## 2017-07-20 ENCOUNTER — Inpatient Hospital Stay (HOSPITAL_COMMUNITY): Payer: Medicare Other | Admitting: Certified Registered Nurse Anesthetist

## 2017-07-20 ENCOUNTER — Inpatient Hospital Stay (HOSPITAL_COMMUNITY): Payer: Medicare Other

## 2017-07-20 ENCOUNTER — Inpatient Hospital Stay (HOSPITAL_COMMUNITY): Payer: Medicare Other | Admitting: Anesthesiology

## 2017-07-20 DIAGNOSIS — Z432 Encounter for attention to ileostomy: Secondary | ICD-10-CM | POA: Diagnosis not present

## 2017-07-20 DIAGNOSIS — A419 Sepsis, unspecified organism: Secondary | ICD-10-CM

## 2017-07-20 DIAGNOSIS — J690 Pneumonitis due to inhalation of food and vomit: Secondary | ICD-10-CM

## 2017-07-20 DIAGNOSIS — J9601 Acute respiratory failure with hypoxia: Secondary | ICD-10-CM

## 2017-07-20 DIAGNOSIS — K9413 Enterostomy malfunction: Secondary | ICD-10-CM

## 2017-07-20 DIAGNOSIS — I10 Essential (primary) hypertension: Secondary | ICD-10-CM

## 2017-07-20 DIAGNOSIS — E876 Hypokalemia: Secondary | ICD-10-CM

## 2017-07-20 DIAGNOSIS — N179 Acute kidney failure, unspecified: Secondary | ICD-10-CM

## 2017-07-20 DIAGNOSIS — J449 Chronic obstructive pulmonary disease, unspecified: Secondary | ICD-10-CM | POA: Diagnosis not present

## 2017-07-20 DIAGNOSIS — Z9889 Other specified postprocedural states: Secondary | ICD-10-CM

## 2017-07-20 DIAGNOSIS — C155 Malignant neoplasm of lower third of esophagus: Principal | ICD-10-CM

## 2017-07-20 DIAGNOSIS — J9600 Acute respiratory failure, unspecified whether with hypoxia or hypercapnia: Secondary | ICD-10-CM

## 2017-07-20 DIAGNOSIS — R6520 Severe sepsis without septic shock: Secondary | ICD-10-CM

## 2017-07-20 DIAGNOSIS — M1990 Unspecified osteoarthritis, unspecified site: Secondary | ICD-10-CM | POA: Diagnosis not present

## 2017-07-20 HISTORY — PX: LAPAROTOMY: SHX154

## 2017-07-20 LAB — BASIC METABOLIC PANEL
Anion gap: 12 (ref 5–15)
BUN: 19 mg/dL (ref 6–20)
CALCIUM: 9 mg/dL (ref 8.9–10.3)
CO2: 29 mmol/L (ref 22–32)
Chloride: 99 mmol/L — ABNORMAL LOW (ref 101–111)
Creatinine, Ser: 1.28 mg/dL — ABNORMAL HIGH (ref 0.61–1.24)
GFR calc Af Amer: >60 mL/min (ref >60)
GFR, EST NON AFRICAN AMERICAN: 55 mL/min — AB (ref >60)
GLUCOSE: 86 mg/dL (ref 65–99)
POTASSIUM: 3.5 mmol/L (ref 3.5–5.1)
Sodium: 140 mmol/L (ref 135–145)

## 2017-07-20 LAB — BLOOD GAS, ARTERIAL
Acid-Base Excess: 5 mmol/L — ABNORMAL HIGH (ref 0.0–2.0)
BICARBONATE: 29.5 mmol/L — AB (ref 20.0–28.0)
Drawn by: 308601
FIO2: 70
LHR: 16 {breaths}/min
MECHVT: 530 mL
O2 Saturation: 99.5 %
PATIENT TEMPERATURE: 37
PCO2 ART: 44.4 mmHg (ref 32.0–48.0)
PEEP: 5 cmH2O
pH, Arterial: 7.437 (ref 7.350–7.450)
pO2, Arterial: 238 mmHg — ABNORMAL HIGH (ref 83.0–108.0)

## 2017-07-20 LAB — CBC WITH DIFFERENTIAL/PLATELET
Basophils Absolute: 0 10*3/uL (ref 0.0–0.1)
Basophils Relative: 0 %
Eosinophils Absolute: 0 10*3/uL (ref 0.0–0.7)
Eosinophils Relative: 0 %
HEMATOCRIT: 40.7 % (ref 39.0–52.0)
HEMOGLOBIN: 13.4 g/dL (ref 13.0–17.0)
LYMPHS ABS: 0.3 10*3/uL — AB (ref 0.7–4.0)
LYMPHS PCT: 2 %
MCH: 29.5 pg (ref 26.0–34.0)
MCHC: 32.9 g/dL (ref 30.0–36.0)
MCV: 89.6 fL (ref 78.0–100.0)
MONOS PCT: 1 %
Monocytes Absolute: 0.2 10*3/uL (ref 0.1–1.0)
NEUTROS ABS: 21.1 10*3/uL — AB (ref 1.7–7.7)
NEUTROS PCT: 97 %
Platelets: 175 10*3/uL (ref 150–400)
RBC: 4.54 MIL/uL (ref 4.22–5.81)
RDW: 15.5 % (ref 11.5–15.5)
WBC: 21.6 10*3/uL — ABNORMAL HIGH (ref 4.0–10.5)

## 2017-07-20 LAB — COMPREHENSIVE METABOLIC PANEL
ALBUMIN: 2.5 g/dL — AB (ref 3.5–5.0)
ALT: 15 U/L — ABNORMAL LOW (ref 17–63)
AST: 27 U/L (ref 15–41)
Alkaline Phosphatase: 60 U/L (ref 38–126)
Anion gap: 11 (ref 5–15)
BILIRUBIN TOTAL: 0.5 mg/dL (ref 0.3–1.2)
BUN: 19 mg/dL (ref 6–20)
CO2: 30 mmol/L (ref 22–32)
Calcium: 8.5 mg/dL — ABNORMAL LOW (ref 8.9–10.3)
Chloride: 98 mmol/L — ABNORMAL LOW (ref 101–111)
Creatinine, Ser: 1.3 mg/dL — ABNORMAL HIGH (ref 0.61–1.24)
GFR calc Af Amer: >60 mL/min (ref >60)
GFR calc non Af Amer: 54 mL/min — ABNORMAL LOW (ref >60)
GLUCOSE: 92 mg/dL (ref 65–99)
POTASSIUM: 3.3 mmol/L — AB (ref 3.5–5.1)
Sodium: 139 mmol/L (ref 135–145)
TOTAL PROTEIN: 5.3 g/dL — AB (ref 6.5–8.1)

## 2017-07-20 LAB — GLUCOSE, CAPILLARY
GLUCOSE-CAPILLARY: 90 mg/dL (ref 65–99)
GLUCOSE-CAPILLARY: 81 mg/dL (ref 65–99)
GLUCOSE-CAPILLARY: 93 mg/dL (ref 65–99)
Glucose-Capillary: 80 mg/dL (ref 65–99)
Glucose-Capillary: 92 mg/dL (ref 65–99)
Glucose-Capillary: 93 mg/dL (ref 65–99)

## 2017-07-20 LAB — CBC
HEMATOCRIT: 45.4 % (ref 39.0–52.0)
Hemoglobin: 15.1 g/dL (ref 13.0–17.0)
MCH: 29.5 pg (ref 26.0–34.0)
MCHC: 33.3 g/dL (ref 30.0–36.0)
MCV: 88.8 fL (ref 78.0–100.0)
PLATELETS: 180 10*3/uL (ref 150–400)
RBC: 5.11 MIL/uL (ref 4.22–5.81)
RDW: 15.3 % (ref 11.5–15.5)
WBC: 24 10*3/uL — ABNORMAL HIGH (ref 4.0–10.5)

## 2017-07-20 LAB — PROCALCITONIN: PROCALCITONIN: 0.43 ng/mL

## 2017-07-20 LAB — MRSA PCR SCREENING: MRSA by PCR: NEGATIVE

## 2017-07-20 LAB — PHOSPHORUS: Phosphorus: 3.2 mg/dL (ref 2.5–4.6)

## 2017-07-20 LAB — MAGNESIUM: Magnesium: 1.3 mg/dL — ABNORMAL LOW (ref 1.7–2.4)

## 2017-07-20 LAB — LACTIC ACID, PLASMA: LACTIC ACID, VENOUS: 2.3 mmol/L — AB (ref 0.5–1.9)

## 2017-07-20 SURGERY — CANCELLED PROCEDURE
Anesthesia: General

## 2017-07-20 SURGERY — LAPAROTOMY, EXPLORATORY
Anesthesia: General

## 2017-07-20 SURGERY — LAPAROTOMY, EXPLORATORY
Anesthesia: General | Site: Abdomen

## 2017-07-20 MED ORDER — IOPAMIDOL (ISOVUE-300) INJECTION 61%: 15.0000 mL | Freq: Once | INTRAVENOUS | Status: AC | PRN

## 2017-07-20 MED ORDER — DEXAMETHASONE SODIUM PHOSPHATE 10 MG/ML IJ SOLN
INTRAMUSCULAR | Status: AC
  Filled 2017-07-20: qty 1

## 2017-07-20 MED ORDER — LEVALBUTEROL HCL 0.63 MG/3ML IN NEBU
INHALATION_SOLUTION | RESPIRATORY_TRACT | Status: AC
  Filled 2017-07-20: qty 3

## 2017-07-20 MED ORDER — CHLORHEXIDINE GLUCONATE CLOTH 2 % EX PADS: 6.0000 | MEDICATED_PAD | Freq: Once | CUTANEOUS | Status: DC

## 2017-07-20 MED ORDER — SODIUM CHLORIDE 0.9 % IV BOLUS (SEPSIS)
1000.0000 mL | Freq: Once | INTRAVENOUS | Status: AC
  Administered 2017-07-20: 1000 mL via INTRAVENOUS

## 2017-07-20 MED ORDER — SUCCINYLCHOLINE CHLORIDE 200 MG/10ML IV SOSY
PREFILLED_SYRINGE | INTRAVENOUS | Status: AC
  Filled 2017-07-20: qty 10

## 2017-07-20 MED ORDER — MIDAZOLAM HCL 2 MG/2ML IJ SOLN
INTRAMUSCULAR | Status: AC
  Filled 2017-07-20: qty 2

## 2017-07-20 MED ORDER — PROPOFOL 10 MG/ML IV BOLUS
INTRAVENOUS | Status: DC | PRN
  Administered 2017-07-20: 60 mg via INTRAVENOUS

## 2017-07-20 MED ORDER — SODIUM CHLORIDE 0.9 % IV SOLN
1.0000 g | INTRAVENOUS | Status: DC
  Administered 2017-07-20 – 2017-07-28 (×9): 1 g via INTRAVENOUS
  Filled 2017-07-20 (×10): qty 1

## 2017-07-20 MED ORDER — LIDOCAINE 2% (20 MG/ML) 5 ML SYRINGE
INTRAMUSCULAR | Status: AC
  Filled 2017-07-20: qty 5

## 2017-07-20 MED ORDER — 0.9 % SODIUM CHLORIDE (POUR BTL) OPTIME
TOPICAL | Status: DC | PRN
  Administered 2017-07-20: 2000 mL

## 2017-07-20 MED ORDER — LACTATED RINGERS IV SOLN
INTRAVENOUS | Status: DC | PRN
  Administered 2017-07-20: 16:00:00 via INTRAVENOUS

## 2017-07-20 MED ORDER — FENTANYL CITRATE (PF) 100 MCG/2ML IJ SOLN: 50.0000 ug | Freq: Once | INTRAMUSCULAR | Status: DC

## 2017-07-20 MED ORDER — POTASSIUM CHLORIDE 10 MEQ/100ML IV SOLN
10.0000 meq | INTRAVENOUS | Status: AC
  Administered 2017-07-20 – 2017-07-21 (×6): 10 meq via INTRAVENOUS
  Filled 2017-07-20 (×6): qty 100

## 2017-07-20 MED ORDER — SODIUM CHLORIDE 0.9 % IV SOLN
25.0000 ug/h | INTRAVENOUS | Status: DC
  Administered 2017-07-20: 25 ug/h via INTRAVENOUS
  Filled 2017-07-20: qty 50

## 2017-07-20 MED ORDER — BUDESONIDE 0.5 MG/2ML IN SUSP
0.5000 mg | Freq: Two times a day (BID) | RESPIRATORY_TRACT | Status: DC
  Administered 2017-07-20 – 2017-07-26 (×12): 0.5 mg via RESPIRATORY_TRACT
  Filled 2017-07-20 (×12): qty 2

## 2017-07-20 MED ORDER — FENTANYL CITRATE (PF) 100 MCG/2ML IJ SOLN
INTRAMUSCULAR | Status: AC
  Filled 2017-07-20: qty 2

## 2017-07-20 MED ORDER — BACITRACIN-NEOMYCIN-POLYMYXIN 400-5-5000 EX OINT
TOPICAL_OINTMENT | Freq: Once | CUTANEOUS | Status: DC
  Filled 2017-07-20: qty 1

## 2017-07-20 MED ORDER — LORAZEPAM 2 MG/ML IJ SOLN
1.0000 mg | INTRAMUSCULAR | Status: DC | PRN
  Administered 2017-07-22: 1 mg via INTRAVENOUS
  Filled 2017-07-20: qty 1

## 2017-07-20 MED ORDER — METHYLPREDNISOLONE SODIUM SUCC 125 MG IJ SOLR
60.0000 mg | Freq: Once | INTRAMUSCULAR | Status: AC
  Administered 2017-07-20: 60 mg via INTRAVENOUS
  Filled 2017-07-20: qty 2

## 2017-07-20 MED ORDER — IOPAMIDOL (ISOVUE-300) INJECTION 61%
INTRAVENOUS | Status: AC
  Administered 2017-07-20: 100 mL via INTRAVENOUS
  Filled 2017-07-20: qty 100

## 2017-07-20 MED ORDER — LEVALBUTEROL HCL 0.63 MG/3ML IN NEBU
0.6300 mg | INHALATION_SOLUTION | Freq: Three times a day (TID) | RESPIRATORY_TRACT | Status: DC | PRN
  Administered 2017-07-20: 0.63 mg via RESPIRATORY_TRACT

## 2017-07-20 MED ORDER — ONDANSETRON HCL 4 MG/2ML IJ SOLN
INTRAMUSCULAR | Status: DC | PRN
  Administered 2017-07-20: 4 mg via INTRAVENOUS

## 2017-07-20 MED ORDER — MIDAZOLAM HCL 5 MG/5ML IJ SOLN
INTRAMUSCULAR | Status: DC | PRN
  Administered 2017-07-20: 1 mg via INTRAVENOUS

## 2017-07-20 MED ORDER — PHENYLEPHRINE HCL 10 MG/ML IJ SOLN
INTRAMUSCULAR | Status: AC
  Filled 2017-07-20: qty 2

## 2017-07-20 MED ORDER — LEVALBUTEROL HCL 0.63 MG/3ML IN NEBU
0.6300 mg | INHALATION_SOLUTION | Freq: Three times a day (TID) | RESPIRATORY_TRACT | Status: DC
  Filled 2017-07-20: qty 3

## 2017-07-20 MED ORDER — MIDAZOLAM HCL 2 MG/2ML IJ SOLN: 1.0000 mg | INTRAMUSCULAR | Status: DC | PRN

## 2017-07-20 MED ORDER — HEPARIN SODIUM (PORCINE) 5000 UNIT/ML IJ SOLN
5000.0000 [IU] | Freq: Three times a day (TID) | INTRAMUSCULAR | Status: DC
  Administered 2017-07-20 – 2017-08-02 (×37): 5000 [IU] via SUBCUTANEOUS
  Filled 2017-07-20 (×36): qty 1

## 2017-07-20 MED ORDER — SODIUM CHLORIDE 0.9 % IV SOLN
250.0000 mL | INTRAVENOUS | Status: DC | PRN
  Administered 2017-07-27 – 2017-08-01 (×4): 250 mL via INTRAVENOUS

## 2017-07-20 MED ORDER — FUROSEMIDE 10 MG/ML IJ SOLN
20.0000 mg | Freq: Once | INTRAMUSCULAR | Status: AC
  Administered 2017-07-20: 20 mg via INTRAVENOUS
  Filled 2017-07-20: qty 2

## 2017-07-20 MED ORDER — DOCUSATE SODIUM 50 MG/5ML PO LIQD: 100.0000 mg | Freq: Two times a day (BID) | ORAL | Status: DC | PRN

## 2017-07-20 MED ORDER — LEVALBUTEROL HCL 0.63 MG/3ML IN NEBU: 0.6300 mg | INHALATION_SOLUTION | Freq: Three times a day (TID) | RESPIRATORY_TRACT | Status: DC | PRN

## 2017-07-20 MED ORDER — ONDANSETRON HCL 4 MG/2ML IJ SOLN
INTRAMUSCULAR | Status: AC
  Filled 2017-07-20: qty 2

## 2017-07-20 MED ORDER — FENTANYL CITRATE (PF) 100 MCG/2ML IJ SOLN
INTRAMUSCULAR | Status: DC | PRN
  Administered 2017-07-20: 50 ug via INTRAVENOUS

## 2017-07-20 MED ORDER — POTASSIUM CHLORIDE IN NACL 20-0.9 MEQ/L-% IV SOLN
INTRAVENOUS | Status: DC
  Filled 2017-07-20: qty 1000

## 2017-07-20 MED ORDER — PHENYLEPHRINE HCL 10 MG/ML IJ SOLN
INTRAVENOUS | Status: DC | PRN
  Administered 2017-07-20: 50 ug/min via INTRAVENOUS

## 2017-07-20 MED ORDER — ROCURONIUM BROMIDE 50 MG/5ML IV SOSY
PREFILLED_SYRINGE | INTRAVENOUS | Status: AC
  Filled 2017-07-20: qty 5

## 2017-07-20 MED ORDER — IPRATROPIUM BROMIDE 0.02 % IN SOLN: 0.5000 mg | Freq: Four times a day (QID) | RESPIRATORY_TRACT | Status: DC | PRN

## 2017-07-20 MED ORDER — ARFORMOTEROL TARTRATE 15 MCG/2ML IN NEBU
15.0000 ug | INHALATION_SOLUTION | Freq: Two times a day (BID) | RESPIRATORY_TRACT | Status: DC
  Administered 2017-07-20 – 2017-07-26 (×12): 15 ug via RESPIRATORY_TRACT
  Filled 2017-07-20 (×12): qty 2

## 2017-07-20 MED ORDER — MAGNESIUM SULFATE 2 GM/50ML IV SOLN
2.0000 g | Freq: Once | INTRAVENOUS | Status: AC
  Administered 2017-07-20: 2 g via INTRAVENOUS
  Filled 2017-07-20: qty 50

## 2017-07-20 MED ORDER — ORAL CARE MOUTH RINSE
15.0000 mL | Freq: Four times a day (QID) | OROMUCOSAL | Status: DC
  Administered 2017-07-21 (×2): 15 mL via OROMUCOSAL

## 2017-07-20 MED ORDER — IPRATROPIUM-ALBUTEROL 0.5-2.5 (3) MG/3ML IN SOLN
3.0000 mL | Freq: Four times a day (QID) | RESPIRATORY_TRACT | Status: DC
  Administered 2017-07-20 – 2017-07-21 (×3): 3 mL via RESPIRATORY_TRACT
  Filled 2017-07-20 (×3): qty 3

## 2017-07-20 MED ORDER — CHLORHEXIDINE GLUCONATE 0.12% ORAL RINSE (MEDLINE KIT)
15.0000 mL | Freq: Two times a day (BID) | OROMUCOSAL | Status: DC
  Administered 2017-07-20 – 2017-07-21 (×2): 15 mL via OROMUCOSAL

## 2017-07-20 MED ORDER — DEXAMETHASONE SODIUM PHOSPHATE 10 MG/ML IJ SOLN
INTRAMUSCULAR | Status: DC | PRN
  Administered 2017-07-20: 10 mg via INTRAVENOUS

## 2017-07-20 MED ORDER — IPRATROPIUM BROMIDE 0.02 % IN SOLN
0.5000 mg | Freq: Three times a day (TID) | RESPIRATORY_TRACT | Status: DC
  Filled 2017-07-20: qty 2.5

## 2017-07-20 MED ORDER — LORAZEPAM 2 MG/ML IJ SOLN: 1.0000 mg | INTRAMUSCULAR | Status: DC | PRN

## 2017-07-20 MED ORDER — LIDOCAINE 2% (20 MG/ML) 5 ML SYRINGE
INTRAMUSCULAR | Status: DC | PRN
  Administered 2017-07-20: 70 mg via INTRAVENOUS

## 2017-07-20 MED ORDER — PROPOFOL 10 MG/ML IV BOLUS
INTRAVENOUS | Status: AC
  Filled 2017-07-20: qty 60

## 2017-07-20 MED ORDER — FOLIC ACID 5 MG/ML IJ SOLN
1.0000 mg | Freq: Every day | INTRAMUSCULAR | Status: DC
  Administered 2017-07-20 – 2017-07-31 (×11): 1 mg via INTRAVENOUS
  Filled 2017-07-20 (×13): qty 0.2

## 2017-07-20 MED ORDER — PHENYLEPHRINE 40 MCG/ML (10ML) SYRINGE FOR IV PUSH (FOR BLOOD PRESSURE SUPPORT)
PREFILLED_SYRINGE | INTRAVENOUS | Status: DC | PRN
  Administered 2017-07-20 (×2): 80 ug via INTRAVENOUS
  Administered 2017-07-20: 40 ug via INTRAVENOUS

## 2017-07-20 MED ORDER — ROCURONIUM BROMIDE 10 MG/ML (PF) SYRINGE
PREFILLED_SYRINGE | INTRAVENOUS | Status: DC | PRN
  Administered 2017-07-20: 50 mg via INTRAVENOUS
  Administered 2017-07-20: 30 mg via INTRAVENOUS

## 2017-07-20 MED ORDER — PROPOFOL 10 MG/ML IV BOLUS
INTRAVENOUS | Status: AC
  Filled 2017-07-20: qty 20

## 2017-07-20 MED ORDER — SUCCINYLCHOLINE CHLORIDE 200 MG/10ML IV SOSY
PREFILLED_SYRINGE | INTRAVENOUS | Status: DC | PRN
  Administered 2017-07-20: 140 mg via INTRAVENOUS

## 2017-07-20 MED ORDER — ACETAMINOPHEN 325 MG PO TABS: 650.0000 mg | ORAL_TABLET | ORAL | Status: DC | PRN

## 2017-07-20 MED ORDER — LACTATED RINGERS IV SOLN
INTRAVENOUS | Status: DC | PRN
  Administered 2017-07-20: 17:00:00 via INTRAVENOUS

## 2017-07-20 MED ORDER — IOPAMIDOL (ISOVUE-300) INJECTION 61%
100.0000 mL | Freq: Once | INTRAVENOUS | Status: AC | PRN
  Administered 2017-07-20: 100 mL via INTRAVENOUS

## 2017-07-20 MED ORDER — METOPROLOL TARTRATE 5 MG/5ML IV SOLN: 5.0000 mg | Freq: Three times a day (TID) | INTRAVENOUS | Status: DC | PRN

## 2017-07-20 MED ORDER — FENTANYL BOLUS VIA INFUSION
25.0000 ug | INTRAVENOUS | Status: DC | PRN
  Filled 2017-07-20: qty 25

## 2017-07-20 MED ORDER — IOPAMIDOL (ISOVUE-300) INJECTION 61%
INTRAVENOUS | Status: AC
  Filled 2017-07-20: qty 30

## 2017-07-20 MED ORDER — THIAMINE HCL 100 MG/ML IJ SOLN
100.0000 mg | Freq: Every day | INTRAMUSCULAR | Status: DC
  Administered 2017-07-20 – 2017-08-02 (×14): 100 mg via INTRAVENOUS
  Filled 2017-07-20 (×15): qty 2

## 2017-07-20 MED ORDER — FAMOTIDINE IN NACL 20-0.9 MG/50ML-% IV SOLN: 20.0000 mg | Freq: Two times a day (BID) | INTRAVENOUS | Status: DC

## 2017-07-20 MED ORDER — PHENYLEPHRINE 40 MCG/ML (10ML) SYRINGE FOR IV PUSH (FOR BLOOD PRESSURE SUPPORT)
PREFILLED_SYRINGE | INTRAVENOUS | Status: AC
  Filled 2017-07-20: qty 10

## 2017-07-20 SURGICAL SUPPLY — 51 items
APPLICATOR COTTON TIP 6IN STRL (MISCELLANEOUS) ×8 IMPLANT
BLADE EXTENDED COATED 6.5IN (ELECTRODE) IMPLANT
BLADE HEX COATED 2.75 (ELECTRODE) ×4 IMPLANT
BLADE SURG SZ10 CARB STEEL (BLADE) ×4 IMPLANT
CLIP VESOCCLUDE LG 6/CT (CLIP) IMPLANT
COVER MAYO STAND STRL (DRAPES) ×4 IMPLANT
COVER SURGICAL LIGHT HANDLE (MISCELLANEOUS) ×4 IMPLANT
DRAPE LAPAROSCOPIC ABDOMINAL (DRAPES) ×4 IMPLANT
DRAPE SHEET LG 3/4 BI-LAMINATE (DRAPES) IMPLANT
DRAPE WARM FLUID 44X44 (DRAPE) ×4 IMPLANT
DRSG OPSITE POSTOP 4X8 (GAUZE/BANDAGES/DRESSINGS) ×3 IMPLANT
ELECT REM PT RETURN 15FT ADLT (MISCELLANEOUS) ×4 IMPLANT
GAUZE SPONGE 4X4 12PLY STRL (GAUZE/BANDAGES/DRESSINGS) ×4 IMPLANT
GLOVE BIOGEL PI IND STRL 7.0 (GLOVE) ×2 IMPLANT
GLOVE BIOGEL PI IND STRL 7.5 (GLOVE) ×2 IMPLANT
GLOVE BIOGEL PI INDICATOR 7.0 (GLOVE) ×2
GLOVE BIOGEL PI INDICATOR 7.5 (GLOVE) ×2
GLOVE ECLIPSE 7.5 STRL STRAW (GLOVE) ×8 IMPLANT
GOWN STRL REUS W/TWL LRG LVL3 (GOWN DISPOSABLE) ×4 IMPLANT
GOWN STRL REUS W/TWL XL LVL3 (GOWN DISPOSABLE) ×8 IMPLANT
HANDLE SUCTION POOLE (INSTRUMENTS) ×2 IMPLANT
KIT BASIN OR (CUSTOM PROCEDURE TRAY) ×4 IMPLANT
LEGGING LITHOTOMY PAIR STRL (DRAPES) IMPLANT
NS IRRIG 1000ML POUR BTL (IV SOLUTION) ×8 IMPLANT
PACK GENERAL/GYN (CUSTOM PROCEDURE TRAY) ×4 IMPLANT
SEALER TISSUE X1 CVD JAW (INSTRUMENTS) IMPLANT
SHEARS HARMONIC ACE PLUS 36CM (ENDOMECHANICALS) IMPLANT
SPONGE LAP 18X18 X RAY DECT (DISPOSABLE) IMPLANT
STAPLER VISISTAT 35W (STAPLE) ×4 IMPLANT
SUCTION POOLE HANDLE (INSTRUMENTS) ×4
SUT NOV 1 T60/GS (SUTURE) IMPLANT
SUT NOVA 1 T20/GS 25DT (SUTURE) IMPLANT
SUT NOVA NAB DX-16 0-1 5-0 T12 (SUTURE) IMPLANT
SUT NOVA T20/GS 25 (SUTURE) IMPLANT
SUT PDS AB 0 CT1 36 (SUTURE) ×3 IMPLANT
SUT PDS AB 1 CTX 36 (SUTURE) IMPLANT
SUT PDS AB 1 TP1 96 (SUTURE) IMPLANT
SUT SILK 2 0 (SUTURE) ×4
SUT SILK 2 0 SH CR/8 (SUTURE) ×4 IMPLANT
SUT SILK 2 0SH CR/8 30 (SUTURE) IMPLANT
SUT SILK 2-0 18XBRD TIE 12 (SUTURE) ×2 IMPLANT
SUT SILK 2-0 30XBRD TIE 12 (SUTURE) IMPLANT
SUT SILK 3 0 (SUTURE) ×8
SUT SILK 3 0 SH CR/8 (SUTURE) ×7 IMPLANT
SUT SILK 3-0 18XBRD TIE 12 (SUTURE) ×4 IMPLANT
SUT VIC AB 3-0 54XBRD REEL (SUTURE) IMPLANT
SUT VIC AB 3-0 BRD 54 (SUTURE)
TAPE CLOTH SURG 4X10 WHT LF (GAUZE/BANDAGES/DRESSINGS) ×3 IMPLANT
TOWEL OR 17X26 10 PK STRL BLUE (TOWEL DISPOSABLE) ×8 IMPLANT
TRAY FOLEY W/METER SILVER 16FR (SET/KITS/TRAYS/PACK) ×4 IMPLANT
YANKAUER SUCT BULB TIP NO VENT (SUCTIONS) ×4 IMPLANT

## 2017-07-20 SURGICAL SUPPLY — 15 items

## 2017-07-20 NOTE — Transfer of Care (Signed)
Immediate Anesthesia Transfer of Care Note  Patient: George Stokes  Procedure(s) Performed: EXPLORATORY LAPAROTOMY; REVISION OF JEJUNOSTOMY (N/A Abdomen)  Patient Location: ICU  Anesthesia Type:General  Level of Consciousness: Patient remains intubated per anesthesia plan  Airway & Oxygen Therapy: Patient remains intubated per anesthesia plan  Post-op Assessment: Report given to RN and Post -op Vital signs reviewed and stable  Post vital signs: Reviewed and stable  Last Vitals:  Vitals:   07/20/17 1304 07/20/17 1838  BP: (!) 144/96   Pulse: (!) 125 96  Resp: 20 18  Temp: 36.6 C   SpO2: 90% 99%    Last Pain:  Vitals:   07/20/17 1633  TempSrc:   PainSc: 0-No pain      Patients Stated Pain Goal: 0 (05/69/79 4801)  Complications: No apparent anesthesia complications

## 2017-07-20 NOTE — Progress Notes (Addendum)
PROGRESS NOTE    George Stokes  ZOX:[id number] DOB: 02-22-1946 DOA: 07/07/2017 PCP: Patient, No Pcp Per   Brief Narrative: George Stokes is an 72 y.o. male past medical history significant for COPD on home oxygen comes for 1 week of nausea vomiting.He denied any abdominal pain or hematemesis or  Diarrhea on presentation.    Patient underwent EGD by GI .Found to have malignant  esophageal stenosis.  Marland KitchenBiopsy showed atypical cells suspicious for adenocarcinoma.  Radiation oncology has been consulted. EGD performed on 07/13/2016. Consulted general surgery, and feeding jejunostomy tube placed on 07/15/2017. CT abdomen/pelvis done on 07/20/17 showed High-grade proximal small bowel obstruction with transition at the jejunostomy tube.Dilated esophagus above the GE junction .Opacity at the right base concerning for recurrent aspiration.   Assessment & Plan:   Principal Problem:   Adenocarcinoma of esophagus (HCC) Active Problems:   Essential hypertension   COPD with hypoxia (HCC)   Chronic respiratory failure with hypoxia (HCC)   Intractable nausea and vomiting   CAP (community acquired pneumonia)   Lobar pneumonia (HCC)   Hypomagnesemia   Hypokalemia   Protein-calorie malnutrition, severe (HCC)   Esophageal dysphagia   Esophageal mass   Esophageal stricture   Esophageal cancer (HCC)   Malnutrition of moderate degree  Adenocarcinoma of the GE junction: Presented with intractable nausea and vomiting secondary to malignant esophageal stenosis. GI was consulted and  EGD performed on 07/13/2016. Consulted general surgery, and feeding jejunostomy tube placed on 07/15/2017. CEA 2.8.  Again started having nausea & vomiting since  07/19/16 so placed him  N.p.o. Stopped tube feeding.  CT abdomen/pelvis done on 07/20/17 showed High-grade proximal small bowel obstruction with transition at the jejunostomy tube.Dilated esophagus above the GE junction .Opacity at the right base concerning for recurrent  aspiration.  Surgery planning for exploratory laparotomy. Plan was to repeat EGD for additional biopsies due to path report not definitive for adenocarcinoma.  But procedure was not done  because patient was tachycardic. Patient has maintained appearing mass at the [facility] which is clinically and adenocarcinoma.  Pathology reading was not definitive.  Pathology report documented as small, mildly atypical mucosal fragments suspicious for adenocarcinoma. Oncology and radiation oncology following.  Plan is to start on concurrent radiation chemotherapy.he might be a candidate for esophagectomy following neoadjuvant treatment  Aspiration pneumonia: Chest x-ray today showed right lower lobe opacity.  Currently on Invanz. Also had leukocytosis today.  Remains afebrile  Acute kidney injury: Continue gentle IV fluids  COPD: Currently on exacerbation.  Had been wheezing this morning.  Saturating below 90.  Sinus tachycardic. Started on bronchodilators.  Also started on IV steroids. He is on home oxygen.  Malnutrition of moderate degree: Nutrition following.  DVT prophylaxis: SCD Code Status: Full Family Communication: Family member present on the bedside Disposition Plan: Unknown at this time   Consultants: General surgery, radiation oncology, oncology, GI  Procedures: EGD, jejunostomy tube placement  Antimicrobials: Invanz since 07/20/17  Subjective: Patient seen and examined the bedside this morning.  He was hypoxic and tachycardic.  He just returned from endoscopy suite.  Could not undergoing endoscopy due to tachycardia.  Found to be in mild to moderate respiratory distress with wheezing.  Objective: Vitals:   07/20/17 0830 07/20/17 1004 07/20/17 1141 07/20/17 1304  BP:  (!) 143/89  (!) 144/96  Pulse:  (!) 124 (!) 122 (!) 125  Resp:  20 20 20   Temp:  97.6 F (36.4 C)  97.9 F (36.6 C)  TempSrc:  Oral  Oral  SpO2: 93% 94% 90% 90%  Weight:  62.1 kg (137 lb)    Height:  5' 7.25" (1.708  m)      Intake/Output Summary (Last 24 hours) at 07/20/2017 1549 Last data filed at 07/20/2017 0610 Gross per 24 hour  Intake 597.5 ml  Output 200 ml  Net 397.5 ml   Filed Weights   07/17/17 1900 07/20/17 0616 07/20/17 1004  Weight: 59.7 kg (131 lb 9.8 oz) 62.1 kg (137 lb) 62.1 kg (137 lb)    Examination:  General exam: Cachectic, chronically ill, in moderate distress Respiratory system: Bilateral decreased air entry, bilateral expiratory wheezes  cardiovascular system: Sinus tachycardia, S1 & S2 heard, RRR. No JVD, murmurs, rubs, gallops or clicks. No pedal edema. Gastrointestinal system: Abdomen is distended, soft and nontender. No organomegaly or masses felt. J tube,sutures, upper midline incision Central nervous system: Alert and oriented. No focal neurological deficits. Extremities: No edema, no clubbing ,no cyanosis, distal peripheral pulses palpable. Skin: No rashes, lesions or ulcers,no icterus ,no pallor Psychiatry: Judgement and insight appear normal. Mood & affect appropriate.     Data Reviewed: I have personally reviewed following labs and imaging studies  CBC: Recent Labs  Lab 07/15/17 1922 07/16/17 0348 07/17/17 0253 07/20/17 0749  WBC 23.4* 21.1* 11.9* 24.0*  HGB 15.7 13.7 14.1 15.1  HCT 46.2 41.7 43.2 45.4  MCV 89.4 89.5 88.9 88.8  PLT 246 203 193 180   Basic Metabolic Panel: Recent Labs  Lab 07/15/17 1922 07/16/17 0348 07/17/17 0253 07/20/17 0848  NA  --  137 138 140  K  --  4.2 4.0 3.5  CL  --  100* 101 99*  CO2  --  24 27 29   GLUCOSE  --  123* 113* 86  BUN  --  8 10 19   CREATININE 1.00 1.09 1.02 1.28*  CALCIUM  --  8.2* 8.7* 9.0   GFR: Estimated Creatinine Clearance: 47.8 mL/min (A) (by C-G formula based on SCr of 1.28 mg/dL (H)). Liver Function Tests: No results for input(s): AST, ALT, ALKPHOS, BILITOT, PROT, ALBUMIN in the last 168 hours. No results for input(s): LIPASE, AMYLASE in the last 168 hours. No results for input(s): AMMONIA  in the last 168 hours. Coagulation Profile: No results for input(s): INR, PROTIME in the last 168 hours. Cardiac Enzymes: No results for input(s): CKTOTAL, CKMB, CKMBINDEX, TROPONINI in the last 168 hours. BNP (last 3 results) No results for input(s): PROBNP in the last 8760 hours. HbA1C: No results for input(s): HGBA1C in the last 72 hours. CBG: Recent Labs  Lab 07/19/17 2002 07/20/17 0003 07/20/17 0416 07/20/17 0730 07/20/17 1204  GLUCAP 75 76 90 81 80   Lipid Profile: No results for input(s): CHOL, HDL, LDLCALC, TRIG, CHOLHDL, LDLDIRECT in the last 72 hours. Thyroid Function Tests: No results for input(s): TSH, T4TOTAL, FREET4, T3FREE, THYROIDAB in the last 72 hours. Anemia Panel: No results for input(s): VITAMINB12, FOLATE, FERRITIN, TIBC, IRON, RETICCTPCT in the last 72 hours. Sepsis Labs: No results for input(s): PROCALCITON, LATICACIDVEN in the last 168 hours.  No results found for this or any previous visit (from the past 240 hour(s)).       Radiology Studies: Dg Chest 1 View  Result Date: 07/20/2017 CLINICAL DATA:  Shortness of breath, weakness. History of esophageal malignancy, COPD on home oxygen, former smoker. EXAM: CHEST 1 VIEW COMPARISON:  Chest x-ray of July 19, 2017 FINDINGS: There is persistent infiltrate at the right lung base. There no large pleural effusion.  The left lung is clear. The heart and pulmonary vascularity are normal. There is mild tortuosity of the ascending thoracic aorta. The bony thorax is unremarkable. IMPRESSION: COPD. Right basilar pneumonia little changed from yesterday's study. Electronically Signed   By: David  [place] M.D.   On: 07/20/2017 15:12   Ct Abdomen Pelvis W Contrast  Result Date: 07/20/2017 CLINICAL DATA:  Recently diagnosed esophageal cancer. Abdominal distention. Recent jejunostomy (5 days postop) tube with fluid leak concerning for ascites EXAM: CT ABDOMEN AND PELVIS WITH CONTRAST TECHNIQUE: Multidetector CT imaging of  the abdomen and pelvis was performed using the standard protocol following bolus administration of intravenous contrast. CONTRAST:  100 cc Isovue-300 intravenous COMPARISON:  07/08/2017 FINDINGS: Lower chest: Airspace opacity in the right lower lobe. Improved airspace disease in the right middle lobe. Suspect aspiration given the chronically distended esophagus above the GE junction mass. Small bilateral pleural effusion. Hepatobiliary: Geographic low-density in segment 5 and 8 of the liver without visible underlying lesions seen. Negative gallbladder. Pancreas: Unremarkable. Spleen: Unremarkable. Adrenals/Urinary Tract: Negative adrenals. No hydronephrosis or stone. Moderate scarring involving the upper pole left renal cortex. Bilateral simple appearing renal cysts. Unremarkable bladder. Stomach/Bowel: Recent jejunostomy in unremarkable position. The bowel is kinked at the level of the enterotomy and there is marked proximal small bowel and stomach distension. Small volume pneumoperitoneum which is expected after recent surgery. Distended lower esophagus above a GE junction mass. Colonic diverticulosis. Vascular/Lymphatic: Atherosclerosis with advanced atheromatous wall thickening of the aorta. No definite adenopathy. PET-CT would be more sensitive in this setting. Reproductive:No pathologic findings. Other: No ascites or pneumoperitoneum. Musculoskeletal: No acute abnormalities. A call has been placed to the ordering provider. IMPRESSION: 1. High-grade proximal small bowel obstruction with transition at the jejunostomy tube. There is a degree of closed loop physiology given the GE junction mass and severe stenosis. 2. Dilated esophagus above the GE junction mass. There is shifting opacity at the right base concerning for recurrent aspiration. 3. Small pleural effusions. 4. Perfusion anomaly in the ventral right lobe liver without visible underlying cause. Attention on future staging scans. Electronically Signed    By: Marnee Spring M.D.   On: 07/20/2017 15:07   Dg Abdomen Peg Tube Location  Result Date: 07/19/2017 CLINICAL DATA:  Jejunostomy tube leaking. EXAM: ABDOMEN - 1 VIEW COMPARISON:  07/19/2016. FINDINGS: KUB following nonionic contrast administration into enterostomy tube obtained. Enterostomy tube tip noted within a right-sided abdominal small-bowel loop. No definite leakage noted. Contrast from prior study noted in the colon. IMPRESSION: Enterostomy tube tip noted in a small-bowel loop in the right abdomen. Electronically Signed   By: Maisie Fus  Register   On: 07/19/2017 16:41   Dg Chest Port 1 View  Result Date: 07/19/2017 CLINICAL DATA:  Shortness of breath. EXAM: PORTABLE CHEST 1 VIEW COMPARISON:  CT 07/08/2016.  Chest x-ray 01/13/2016. FINDINGS: Mediastinum hilar structures normal. Cardiomegaly with normal pulmonary vascularity. COPD. Right base infiltrate with small right pleural effusion. IMPRESSION: 1. Right base infiltrate consistent with pneumonia. Small right pleural effusion. Similar findings noted on prior CT of 07/08/2016. 2.  COPD. Electronically Signed   By: Maisie Fus  Register   On: 07/19/2017 13:51   Dg Abd Portable 1v  Result Date: 07/19/2017 CLINICAL DATA:  COPD, nausea vomiting EXAM: PORTABLE ABDOMEN - 1 VIEW COMPARISON:  None. FINDINGS: No dilated large or small bowel. Jejunostomy tube with tip in the RIGHT lower quadrant. Midline skin staples noted. No evidence bowel obstruction. Oral contrast from prior CT within the colon IMPRESSION: 1.  No evidence of bowel obstruction. 2. Jejunostomy tube in place. Electronically Signed   By: Genevive Bi M.D.   On: 07/19/2017 10:34        Scheduled Meds: . Chlorhexidine Gluconate Cloth  6 each Topical Once   And  . Chlorhexidine Gluconate Cloth  6 each Topical Once  . enoxaparin (LOVENOX) injection  40 mg Subcutaneous Q24H  . feeding supplement (OSMOLITE 1.5 CAL)  1,000 mL Per Tube Q24H  . guaiFENesin  30 mL Oral BID  . iopamidol       . ipratropium  0.5 mg Nebulization TID  . levalbuterol      . levalbuterol  0.63 mg Nebulization TID  . mometasone-formoterol  2 puff Inhalation BID  . multivitamin  15 mL Oral Daily  . neomycin-bacitracin-polymyxin   Topical Once  . pantoprazole (PROTONIX) IV  40 mg Intravenous Q12H   Continuous Infusions: . dextrose 5 % and 0.45% NaCl 75 mL/hr at 07/19/17 1902  . ertapenem Stopped (07/20/17 1404)  . lactated ringers Stopped (07/19/17 0823)     LOS: 12 days    Time spent: 25 minutes    Shadrach Bartunek Salli Quarry, MD [facility] Pager [phone]  If 7PM-7AM, please contact night-coverage [email or web address] Password [facility] 07/20/2017, 3:49 PM

## 2017-07-20 NOTE — Progress Notes (Signed)
Attempted to fit patient into our schedule today for an EGD. Dr. Roanna Banning, anesthesiologist, concerned about patient's tachycardia. Dr. Fuller Plan notified and both doctors would like a hospitalist to evaluate him before we proceed. That said, the patient will be rescheduled for tomorrow at 915.

## 2017-07-20 NOTE — Progress Notes (Signed)
Patient ID: George Stokes, male   DOB: 09/14/1945, 72 y.o.   MRN: [id number] 5 Days Post-Op   Subjective: Patient without specific complaints.  He denies abdominal pain.  Generally weak which is not a change.  He continues to have serous drainage moderate amounts around the jejunostomy tube.  Objective: Vital signs in last 24 hours: Temp:  [97.5 F (36.4 C)-98.4 F (36.9 C)] 98.4 F (36.9 C) (01/23 0447) Pulse Rate:  [102-147] 102 (01/23 0447) Resp:  [18-20] 20 (01/23 0447) BP: (141-185)/(92-122) 145/103 (01/23 0447) SpO2:  [90 %-97 %] 93 % (01/23 0830) Weight:  [62.1 kg (137 lb)] 62.1 kg (137 lb) (01/23 0616) Last BM Date: 07/19/17  Intake/Output from previous day: 01/22 0701 - 01/23 0700 In: 597.5 [I.V.:597.5] Out: 300 [Urine:300] Intake/Output this shift: No intake/output data recorded.  General appearance: alert, cooperative and Thin and chronically ill-appearing Caucasian male GI: Mild distention with probable fluid wave.  Soft and nontender. Incision/Wound: Upper midline incision without erythema or drainage.  J-tube in place with suture intact.  There is moderate serous to slight blood-tinged drainage around the tube.  Lab Results:  Recent Labs    07/20/17 0749  WBC 24.0*  HGB 15.1  HCT 45.4  PLT 180   BMET No results for input(s): NA, K, CL, CO2, GLUCOSE, BUN, CREATININE, CALCIUM in the last 72 hours.   Studies/Results: Dg Abdomen Peg Tube Location  Result Date: 07/19/2017 CLINICAL DATA:  Jejunostomy tube leaking. EXAM: ABDOMEN - 1 VIEW COMPARISON:  07/19/2016. FINDINGS: KUB following nonionic contrast administration into enterostomy tube obtained. Enterostomy tube tip noted within a right-sided abdominal small-bowel loop. No definite leakage noted. Contrast from prior study noted in the colon. IMPRESSION: Enterostomy tube tip noted in a small-bowel loop in the right abdomen. Electronically Signed   By: Maisie Fus  Register   On: 07/19/2017 16:41   Dg Chest Port 1  View  Result Date: 07/19/2017 CLINICAL DATA:  Shortness of breath. EXAM: PORTABLE CHEST 1 VIEW COMPARISON:  CT 07/08/2016.  Chest x-ray 01/13/2016. FINDINGS: Mediastinum hilar structures normal. Cardiomegaly with normal pulmonary vascularity. COPD. Right base infiltrate with small right pleural effusion. IMPRESSION: 1. Right base infiltrate consistent with pneumonia. Small right pleural effusion. Similar findings noted on prior CT of 07/08/2016. 2.  COPD. Electronically Signed   By: Maisie Fus  Register   On: 07/19/2017 13:51   Dg Abd Portable 1v  Result Date: 07/19/2017 CLINICAL DATA:  COPD, nausea vomiting EXAM: PORTABLE ABDOMEN - 1 VIEW COMPARISON:  None. FINDINGS: No dilated large or small bowel. Jejunostomy tube with tip in the RIGHT lower quadrant. Midline skin staples noted. No evidence bowel obstruction. Oral contrast from prior CT within the colon IMPRESSION: 1. No evidence of bowel obstruction. 2. Jejunostomy tube in place. Electronically Signed   By: Genevive Bi M.D.   On: 07/19/2017 10:34    Anti-infectives: Anti-infectives (From admission, onward)   Start     Dose/Rate Route Frequency Ordered Stop   07/16/17 0000  ceFAZolin (ANCEF) IVPB 2g/100 mL premix     2 g 200 mL/hr over 30 Minutes Intravenous Every 8 hours 07/15/17 1833 07/16/17 0054   07/15/17 0945  amoxicillin-clavulanate (AUGMENTIN) 250-62.5 MG/5ML suspension 500 mg     500 mg Oral Every 8 hours 07/15/17 0938 07/19/17 0928   07/15/17 0600  ceFAZolin (ANCEF) IVPB 2g/100 mL premix     2 g 200 mL/hr over 30 Minutes Intravenous On call to O.R. 07/14/17 1809 07/15/17 1632   07/11/17 1600  ampicillin-sulbactam (UNASYN)  1.5 g in sodium chloride 0.9 % 50 mL IVPB  Status:  Discontinued     1.5 g 100 mL/hr over 30 Minutes Intravenous Every 6 hours 07/11/17 1329 07/15/17 0940   07/08/17 1000  cefTRIAXone (ROCEPHIN) 1 g in dextrose 5 % 50 mL IVPB  Status:  Discontinued     1 g 100 mL/hr over 30 Minutes Intravenous Every 24 hours  07/08/17 0954 07/11/17 1315   07/08/17 1000  azithromycin (ZITHROMAX) 500 mg in dextrose 5 % 250 mL IVPB  Status:  Discontinued     500 mg 250 mL/hr over 60 Minutes Intravenous Every 24 hours 07/08/17 0954 07/11/17 1315      Assessment/Plan: s/p Procedure(s): FEEDING JEJUNOSTOMY TUBE PLACEMENT Serous drainage around the J-tube.  This appears to be ascites.  Tube study shows it is within the small bowel and no leak.  No ascites was noted at the time Dr. Derrell Lolling explored the abdomen.  Not sure why this developed or if it is related directly to the J-tube.  He does have some leukocytosis but no tenderness.  I am going to go ahead and get a CT of the abdomen to assess the ascites and any possible cause particularly in light of his leukocytosis.  I think it would be fine to go ahead and use the J-tube.  Continue dressing changes around the tube as needed.   LOS: 12 days    Mariella Saa 07/20/2017

## 2017-07-20 NOTE — Progress Notes (Signed)
    Progress Note   Subjective   Asked to repeat EGD for additional biopsies due to path report not definitive for adenocarcinoma. No feedings overnight due to J-tube problems. Pt has no GI complaints today. Feels weak.    Objective  Vital signs in last 24 hours: Temp:  [97.5 F (36.4 C)-98.4 F (36.9 C)] 98.4 F (36.9 C) (01/23 0447) Pulse Rate:  [102-147] 102 (01/23 0447) Resp:  [18-20] 20 (01/23 0447) BP: (141-185)/(92-122) 145/103 (01/23 0447) SpO2:  [90 %-97 %] 93 % (01/23 0830) Weight:  [137 lb (62.1 kg)] 137 lb (62.1 kg) (01/23 0616) Last BM Date: 07/19/17  General: Alert, well-developed, thin, chronically ill appearing, in NAD Heart:  Regular rate and rhythm; no murmurs Chest: Clear to ascultation bilaterally Abdomen:  Soft, nontender and nondistended. Healing midline incision, J-tube in left abdomen. Normal bowel sounds, without guarding, and without rebound.   Extremities:  Without edema. Neurologic:  Alert and  oriented x4; grossly normal neurologically. Psych:  Alert and cooperative. Normal mood and affect.  Intake/Output from previous day: 01/22 0701 - 01/23 0700 In: 597.5 [I.V.:597.5] Out: 300 [Urine:300] Intake/Output this shift: No intake/output data recorded.  Lab Results: Recent Labs    07/20/17 0749  WBC 24.0*  HGB 15.1  HCT 45.4  PLT 180    Studies/Results: Dg Abdomen Peg Tube Location  Result Date: 07/19/2017 CLINICAL DATA:  Jejunostomy tube leaking. EXAM: ABDOMEN - 1 VIEW COMPARISON:  07/19/2016. FINDINGS: KUB following nonionic contrast administration into enterostomy tube obtained. Enterostomy tube tip noted within a right-sided abdominal small-bowel loop. No definite leakage noted. Contrast from prior study noted in the colon. IMPRESSION: Enterostomy tube tip noted in a small-bowel loop in the right abdomen. Electronically Signed   By: Marcello Moores  Register   On: 07/19/2017 16:41   Dg Chest Port 1 View  Result Date: 07/19/2017 CLINICAL DATA:   Shortness of breath. EXAM: PORTABLE CHEST 1 VIEW COMPARISON:  CT 07/08/2016.  Chest x-ray 01/13/2016. FINDINGS: Mediastinum hilar structures normal. Cardiomegaly with normal pulmonary vascularity. COPD. Right base infiltrate with small right pleural effusion. IMPRESSION: 1. Right base infiltrate consistent with pneumonia. Small right pleural effusion. Similar findings noted on prior CT of 07/08/2016. 2.  COPD. Electronically Signed   By: Marcello Moores  Register   On: 07/19/2017 13:51   Dg Abd Portable 1v  Result Date: 07/19/2017 CLINICAL DATA:  COPD, nausea vomiting EXAM: PORTABLE ABDOMEN - 1 VIEW COMPARISON:  None. FINDINGS: No dilated large or small bowel. Jejunostomy tube with tip in the RIGHT lower quadrant. Midline skin staples noted. No evidence bowel obstruction. Oral contrast from prior CT within the colon IMPRESSION: 1. No evidence of bowel obstruction. 2. Jejunostomy tube in place. Electronically Signed   By: Suzy Bouchard M.D.   On: 07/19/2017 10:34     Assessment & Plan   1. Malignant appearing mass at [facility] which is clinically an EGJ adenocarcinoma however pathology reading was not definitive: SMALL, MARKEDLY ATYPICAL MUCOSAL FRAGMENT SUSPICIOUS FOR ADENOCARCINOMA.  Asked by Dr. Benay Spice and Radiation Oncology to repeat EGD for additional biopsies. Discussed with patient and his brother at the bedside. The patient understand and agrees to proceed with EGD today. The risks (including bleeding, perforation, infection, missed lesions, medication reactions and possible hospitalization or surgery if complications occur), benefits, and alternatives to endoscopy with possible biopsy and possible dilation were discussed with the patient and they consent to proceed.    LOS: 12 days   Jalal Rauch T. Fuller Plan MD 07/20/2017, 9:36 AM

## 2017-07-20 NOTE — Anesthesia Procedure Notes (Signed)
Procedure Name: Intubation Date/Time: 07/20/2017 4:51 PM Performed by: Victoriano Lain, CRNA Pre-anesthesia Checklist: Patient identified, Emergency Drugs available, Suction available, Patient being monitored and Timeout performed Patient Re-evaluated:Patient Re-evaluated prior to induction Oxygen Delivery Method: Circle system utilized Preoxygenation: Pre-oxygenation with 100% oxygen Induction Type: Rapid sequence and Cricoid Pressure applied Laryngoscope Size: Glidescope and 3 Grade View: Grade I Tube type: Subglottic suction tube Tube size: 7.5 mm Number of attempts: 1 Airway Equipment and Method: Video-laryngoscopy and Stylet Placement Confirmation: ETT inserted through vocal cords under direct vision,  CO2 detector and breath sounds checked- equal and bilateral Secured at: 21 cm Tube secured with: Tape Dental Injury: Teeth and Oropharynx as per pre-operative assessment

## 2017-07-20 NOTE — Interval H&P Note (Signed)
History and Physical Interval Note:  07/20/2017 10:14 AM  George Stokes  has presented today for surgery, with the diagnosis of esophageal cancer  The various methods of treatment have been discussed with the patient and family. After consideration of risks, benefits and other options for treatment, the patient has consented to  Procedure(s): ESOPHAGOGASTRODUODENOSCOPY (EGD) WITH PROPOFOL (N/A) as a surgical intervention .  The patient's history has been reviewed, patient examined, no change in status, stable for surgery.  I have reviewed the patient's chart and labs.  Questions were answered to the patient's satisfaction.     Pricilla Riffle. Fuller Plan

## 2017-07-20 NOTE — Addendum Note (Signed)
Addendum  created 07/20/17 1854 by Victoriano Lain, CRNA   Intraprocedure Meds edited

## 2017-07-20 NOTE — Anesthesia Preprocedure Evaluation (Addendum)
Anesthesia Evaluation  Patient identified by MRN, date of birth, ID band Patient awake    Reviewed: Allergy & Precautions, NPO status , Patient's Chart, lab work & pertinent test results  Airway Mallampati: II  TM Distance: >3 FB Neck ROM: Full    Dental no notable dental hx.    Pulmonary COPD,  COPD inhaler and oxygen dependent, former smoker,    breath sounds clear to auscultation + decreased breath sounds      Cardiovascular hypertension, Pt. on medications Normal cardiovascular exam Rhythm:Regular Rate:Normal  ECG: ST   Neuro/Psych negative neurological ROS  negative psych ROS   GI/Hepatic negative GI ROS, (+)     substance abuse  alcohol use, esophageal cancer   Endo/Other  negative endocrine ROS  Renal/GU Renal disease  negative genitourinary   Musculoskeletal negative musculoskeletal ROS (+) Arthritis , Osteoarthritis,  Gout   Abdominal   Peds negative pediatric ROS (+)  Hematology negative hematology ROS (+)   Anesthesia Other Findings   Reproductive/Obstetrics negative OB ROS                            Anesthesia Physical  Anesthesia Plan  ASA: IV  Anesthesia Plan: General   Post-op Pain Management:    Induction: Intravenous, Rapid sequence and Cricoid pressure planned  PONV Risk Score and Plan: 1 and Treatment may vary due to age or medical condition and Ondansetron  Airway Management Planned: Oral ETT and Video Laryngoscope Planned  Additional Equipment:   Intra-op Plan:   Post-operative Plan: Possible Post-op intubation/ventilation  Informed Consent: I have reviewed the patients History and Physical, chart, labs and discussed the procedure including the risks, benefits and alternatives for the proposed anesthesia with the patient or authorized representative who has indicated his/her understanding and acceptance.     Plan Discussed with: CRNA and  Surgeon  Anesthesia Plan Comments: (  )     Anesthesia Quick Evaluation

## 2017-07-20 NOTE — Progress Notes (Signed)
Patient ID: George Stokes, male   DOB: 06-Dec-1945, 72 y.o.   MRN: [id number] CT scan has unfortunately shown high-grade obstruction at his jejunostomy tube site with markedly dilated proximal small bowel and stomach.  I discussed this finding with the patient and his brother and sister.  He will need laparotomy for exploration of the area and correct the obstruction.  I discussed these findings in the nature of the need for surgery with the patient and the family.  He is very fatigued and mildly short of breath at rest.  I discussed that he may end up on a ventilator postoperatively.  All their questions were answered and they agree to proceed.

## 2017-07-20 NOTE — Progress Notes (Signed)
Pt lethargic but oriented x 4.  CHG baths x 6 cloths done for OR.  Pt able to sign consent forms for surgery.  Technician on floor to transport pt to OR

## 2017-07-20 NOTE — H&P (View-Only) (Signed)
    Progress Note   Subjective   Asked to repeat EGD for additional biopsies due to path report not definitive for adenocarcinoma. No feedings overnight due to J-tube problems. Pt has no GI complaints today. Feels weak.    Objective  Vital signs in last 24 hours: Temp:  [97.5 F (36.4 C)-98.4 F (36.9 C)] 98.4 F (36.9 C) (01/23 0447) Pulse Rate:  [102-147] 102 (01/23 0447) Resp:  [18-20] 20 (01/23 0447) BP: (141-185)/(92-122) 145/103 (01/23 0447) SpO2:  [90 %-97 %] 93 % (01/23 0830) Weight:  [137 lb (62.1 kg)] 137 lb (62.1 kg) (01/23 0616) Last BM Date: 07/19/17  General: Alert, well-developed, thin, chronically ill appearing, in NAD Heart:  Regular rate and rhythm; no murmurs Chest: Clear to ascultation bilaterally Abdomen:  Soft, nontender and nondistended. Healing midline incision, J-tube in left abdomen. Normal bowel sounds, without guarding, and without rebound.   Extremities:  Without edema. Neurologic:  Alert and  oriented x4; grossly normal neurologically. Psych:  Alert and cooperative. Normal mood and affect.  Intake/Output from previous day: 01/22 0701 - 01/23 0700 In: 597.5 [I.V.:597.5] Out: 300 [Urine:300] Intake/Output this shift: No intake/output data recorded.  Lab Results: Recent Labs    07/20/17 0749  WBC 24.0*  HGB 15.1  HCT 45.4  PLT 180    Studies/Results: Dg Abdomen Peg Tube Location  Result Date: 07/19/2017 CLINICAL DATA:  Jejunostomy tube leaking. EXAM: ABDOMEN - 1 VIEW COMPARISON:  07/19/2016. FINDINGS: KUB following nonionic contrast administration into enterostomy tube obtained. Enterostomy tube tip noted within a right-sided abdominal small-bowel loop. No definite leakage noted. Contrast from prior study noted in the colon. IMPRESSION: Enterostomy tube tip noted in a small-bowel loop in the right abdomen. Electronically Signed   By: Marcello Moores  Register   On: 07/19/2017 16:41   Dg Chest Port 1 View  Result Date: 07/19/2017 CLINICAL DATA:   Shortness of breath. EXAM: PORTABLE CHEST 1 VIEW COMPARISON:  CT 07/08/2016.  Chest x-ray 01/13/2016. FINDINGS: Mediastinum hilar structures normal. Cardiomegaly with normal pulmonary vascularity. COPD. Right base infiltrate with small right pleural effusion. IMPRESSION: 1. Right base infiltrate consistent with pneumonia. Small right pleural effusion. Similar findings noted on prior CT of 07/08/2016. 2.  COPD. Electronically Signed   By: Marcello Moores  Register   On: 07/19/2017 13:51   Dg Abd Portable 1v  Result Date: 07/19/2017 CLINICAL DATA:  COPD, nausea vomiting EXAM: PORTABLE ABDOMEN - 1 VIEW COMPARISON:  None. FINDINGS: No dilated large or small bowel. Jejunostomy tube with tip in the RIGHT lower quadrant. Midline skin staples noted. No evidence bowel obstruction. Oral contrast from prior CT within the colon IMPRESSION: 1. No evidence of bowel obstruction. 2. Jejunostomy tube in place. Electronically Signed   By: Suzy Bouchard M.D.   On: 07/19/2017 10:34     Assessment & Plan   1. Malignant appearing mass at [facility] which is clinically an EGJ adenocarcinoma however pathology reading was not definitive: SMALL, MARKEDLY ATYPICAL MUCOSAL FRAGMENT SUSPICIOUS FOR ADENOCARCINOMA.  Asked by Dr. Benay Spice and Radiation Oncology to repeat EGD for additional biopsies. Discussed with patient and his brother at the bedside. The patient understand and agrees to proceed with EGD today. The risks (including bleeding, perforation, infection, missed lesions, medication reactions and possible hospitalization or surgery if complications occur), benefits, and alternatives to endoscopy with possible biopsy and possible dilation were discussed with the patient and they consent to proceed.    LOS: 12 days   Zong Mcquarrie T. Fuller Plan MD 07/20/2017, 9:36 AM

## 2017-07-20 NOTE — Op Note (Signed)
Preoperative Diagnosis: Small bowel obstruction at jejunostomy tube  Postoprative Diagnosis: Same  Procedure: Procedure(s): EXPLORATORY LAPAROTOMY; [place]   Surgeon: Excell Seltzer T   Assistants: None  Anesthesia:  General endotracheal anesthesia  Indications: Patient is a 72 year old male with obstructing esophageal cancer who underwent open placement of a feeding jejunostomy tube on January 18.  He has developed ascites leaking around his jejunostomy tube and abdominal distention and tachycardia.  Contrast injection of his J-tube shows it within the small bowel and no leak.  However CT scan obtained this afternoon shows kinking and obstruction of the jejunum at the site of the J-tube with massively dilated proximal jejunum and stomach.  I have recommended emergency laparotomy with examination and likely revision of his jejunostomy due to obstruction.  This was discussed with the patient and his brother and sister including the nature of surgery and indications and potential complications.  We discussed the likely will be ventilator dependent postoperatively.  They understand and agree to proceed.    Procedure Detail: Patient was brought to the operating room, placed in the supine position on the operating table, and general endotracheal anesthesia induced.  He received broad-spectrum preoperative IV antibiotics.  The abdomen was widely sterilely prepped and draped.  Patient timeout was performed and correct procedure verified.  Initially anesthesia placed an orogastric tube.  Due to narrow nasal cavities and nasogastric tube was unable to be placed.  On placement of the orogastric tube almost 4 L of bilious fluid was obtained with significant decompression of the patient's abdomen.  The previous staples were removed.  The incision was opened bluntly and fascial sutures all removed.  The jejunostomy site was easily examined.  There was kinking of the bowel just proximal to the  entry of the jejunostomy which appeared to be secondary to the angle of the bowel as it was pexed to the anterior abdominal wall.  I took the jejunostomy down from the anterior abdominal wall removing the previous pexy sutures.  The entrance of the tube itself was unremarkable with no leakage.  There was no narrowing secondary to the tube.  It appeared this obstruction could be completely be relieved by re pexing the bowel at a different angle.  The bowel was brought back up to the anterior abdominal wall at an angle that allowed a very smooth transition from the proximal jejunum into the tube site and distal jejunum.  There was plenty of room around the tube without obstruction.  The bowel was pexed up to the anterior abdominal wall with 5 or 6 interrupted 3-0 silk sutures, couple proximal to the tube insertion site and several distal.  This allowed a very smooth curve from the proximal small bowel.  The tube insertion site was approximately 15 cm from the ligament of Treitz.  The tube irrigated smoothly.  No leak.  The stomach was noted to be somewhat inflamed and thinned from the severe distention but certainly viable with no perforation or necrosis.  The remainder of the small bowel appeared normal.  The midline fascia was closed with running 0 PDS begun at either end and tied centrally.  Subcutaneous tissue was irrigated and skin closed with staples.  Sponge needle and instrument counts were correct.    Findings: As above  Estimated Blood Loss:  Minimal         Drains: None  Blood Given: none          Specimens: None        Complications:  *  No complications entered in OR log *         Disposition: ICU - extubated and stable.         Condition: stable

## 2017-07-20 NOTE — Anesthesia Postprocedure Evaluation (Signed)
Anesthesia Post Note  Patient: George Stokes  Procedure(Stokes) Performed: EXPLORATORY LAPAROTOMY; REVISION OF JEJUNOSTOMY (N/A Abdomen)     Patient location during evaluation: SICU Anesthesia Type: General Level of consciousness: sedated Pain management: pain level controlled Vital Signs Assessment: post-procedure vital signs reviewed and stable Respiratory status: patient remains intubated per anesthesia plan Cardiovascular status: stable Postop Assessment: no apparent nausea or vomiting Anesthetic complications: no    Last Vitals:  Vitals:   07/20/17 1304 07/20/17 1838  BP: (!) 144/96 (!) 183/99  Pulse: (!) 125 96  Resp: 20 18  Temp: 36.6 C   SpO2: 90% 99%    Last Pain:  Vitals:   07/20/17 1633  TempSrc:   PainSc: 0-No pain                 George Stokes

## 2017-07-20 NOTE — Consult Note (Signed)
PULMONARY / CRITICAL CARE MEDICINE   Name: George Stokes MRN: [id number] DOB: 1945-09-11    ADMISSION DATE:  07/07/2017 CONSULTATION DATE:  07/20/2016  REFERRING MD:  Dr. Johna Sheriff  CHIEF COMPLAINT:  Post op vent dependence  HISTORY OF PRESENT ILLNESS:   72 year old male with PMH of HTN, COPD, Chronic hypoxia on home O2, and Etoh abuse, admitted to [facility] on 1/11 with complaints of nausea and vomiting since 12/23 failing outpatient treatment. At that time, also found to have AKI and CAP. Workup that admission included EGD, which demonstrated malignant esophageal stenosis. Biopsy revealed adenocarcinoma. J tube placed 1/18. Despite this, he continued to have nausea and vomiting, with worsening abdominal distension and found to have ascites leaking from around the J-tube. CT on 1/23 demonstrated high grade proximal small bowel obstruction. He was taken to the OR this evening for Ex-lap and revision of jejunostomy. Post operatively he remained on the vent and was transferred to the ICU.   PAST MEDICAL HISTORY :  He  has a past medical history of COPD (chronic obstructive pulmonary disease) (HCC), Dyspnea, esophageal ca (dx'd 07/13/17), Esophageal mass, Gout, Heart murmur, Hypertension, On home oxygen therapy, and Pneumonia.  PAST SURGICAL HISTORY: He  has a past surgical history that includes Esophagogastroduodenoscopy (egd) with propofol (N/A, 07/13/2017); Tonsillectomy; and laparotomy (N/A, 07/15/2017).  No Known Allergies  No current facility-administered medications on file prior to encounter.    Current Outpatient Medications on File Prior to Encounter  Medication Sig  . albuterol (PROAIR HFA) 108 (90 Base) MCG/ACT inhaler 2 puffs every 4 hours as needed only  if your can't catch your breath  . amLODipine (NORVASC) 10 MG tablet Take 1 tablet (10 mg total) by mouth daily.  . clotrimazole-betamethasone (LOTRISONE) cream Apply 1 application topically 2 (two) times daily.  . indomethacin (INDOCIN)  50 MG capsule Take 1 capsule (50 mg total) by mouth every 8 (eight) hours as needed. (Patient taking differently: Take 50 mg by mouth every 8 (eight) hours as needed for mild pain. )  . ondansetron (ZOFRAN) 4 MG tablet Take 1 tablet (4 mg total) by mouth every 8 (eight) hours as needed for nausea or vomiting.  . SYMBICORT 160-4.5 MCG/ACT inhaler Inhale 2 puffs into the lungs 2 (two) times daily.  Marland Kitchen HYDROcodone-acetaminophen (NORCO/VICODIN) 5-325 MG tablet Take 1 tablet by mouth every 6 (six) hours as needed. (Patient not taking: Reported on 06/29/2017)  . Tiotropium Bromide Monohydrate (SPIRIVA RESPIMAT) 2.5 MCG/ACT AERS Inhale 2 puffs into the lungs daily. (Patient not taking: Reported on 06/29/2017)  . zolpidem (AMBIEN) 5 MG tablet Take 1 tablet daily at bedtime as needed (Patient not taking: Reported on 12/31/2016)   FAMILY HISTORY:  His indicated that his mother is alive. He indicated that his father is deceased. He indicated that his sister is alive. He indicated that his brother is alive. He indicated that his maternal grandmother is deceased. He indicated that his maternal grandfather is deceased.  SOCIAL HISTORY: He  reports that he quit smoking about 8 years ago. His smoking use included cigarettes. He has a 86.00 pack-year smoking history. he has never used smokeless tobacco. He reports that he drinks alcohol. He reports that he does not use drugs.  REVIEW OF SYSTEMS:   Review of Systems  Unable to perform ROS: Critical illness   SUBJECTIVE:  Intubated and sedated  VITAL SIGNS: BP (!) 144/96 (BP Location: Right Arm)   Pulse 96   Temp 97.9 F (36.6 C) (Oral)  Resp 18   Ht 5' 7.25" (1.708 m)   Wt 62.1 kg (137 lb)   SpO2 99%   BMI 21.30 kg/m   VENTILATOR SETTINGS: Vent Mode: PRVC FiO2 (%):  [100 %] 100 % Set Rate:  [16 bmp] 16 bmp Vt Set:  [530 mL] 530 mL PEEP:  [5 cmH20] 5 cmH20 Plateau Pressure:  [12 cmH20] 12 cmH20  INTAKE / OUTPUT: I/O last 3 completed shifts: In:  1722.5 [I.V.:1722.5] Out: 775 [Urine:775]  PHYSICAL EXAMINATION: General: thin adult male, intubated and sedated, critically ill  Neuro: eyes closed but awakens to voice, nods/shakes head to answer questions, moving all extremities HEENT: PERRL, OP clear, MM moist, Orally intubated Cardiovascular: RRR no m/r/g Lungs: CTA b/l Abdomen: Soft, Nontender, large vertical surgical dressing covering ex-lap incision and J-tube. Dressing was not removed for my exam. No bowel sounds   Musculoskeletal: no LE edema GU: foley catheter in place  Skin: no rashes   LABS:  BMET Recent Labs  Lab 07/16/17 0348 07/17/17 0253 07/20/17 0848  NA 137 138 140  K 4.2 4.0 3.5  CL 100* 101 99*  CO2 24 27 29   BUN 8 10 19   CREATININE 1.09 1.02 1.28*  GLUCOSE 123* 113* 86   Electrolytes Recent Labs  Lab 07/16/17 0348 07/17/17 0253 07/20/17 0848  CALCIUM 8.2* 8.7* 9.0   CBC Recent Labs  Lab 07/16/17 0348 07/17/17 0253 07/20/17 0749  WBC 21.1* 11.9* 24.0*  HGB 13.7 14.1 15.1  HCT 41.7 43.2 45.4  PLT 203 193 180   Coag's No results for input(s): APTT, INR in the last 168 hours.  Sepsis Markers No results for input(s): LATICACIDVEN, PROCALCITON, O2SATVEN in the last 168 hours.  ABG No results for input(s): PHART, PCO2ART, PO2ART in the last 168 hours.  Liver Enzymes No results for input(s): AST, ALT, ALKPHOS, BILITOT, ALBUMIN in the last 168 hours.  Cardiac Enzymes No results for input(s): TROPONINI, PROBNP in the last 168 hours.  Glucose Recent Labs  Lab 07/19/17 2002 07/20/17 0003 07/20/17 0416 07/20/17 0730 07/20/17 1204 07/20/17 1600  GLUCAP 75 76 90 81 80 92   Imaging Dg Chest 1 View  Result Date: 07/20/2017 CLINICAL DATA:  Shortness of breath, weakness. History of esophageal malignancy, COPD on home oxygen, former smoker. EXAM: CHEST 1 VIEW COMPARISON:  Chest x-ray of July 19, 2017 FINDINGS: There is persistent infiltrate at the right lung base. There no large  pleural effusion. The left lung is clear. The heart and pulmonary vascularity are normal. There is mild tortuosity of the ascending thoracic aorta. The bony thorax is unremarkable. IMPRESSION: COPD. Right basilar pneumonia little changed from yesterday's study. Electronically Signed   By: David  [place] M.D.   On: 07/20/2017 15:12   Ct Abdomen Pelvis W Contrast  Result Date: 07/20/2017 CLINICAL DATA:  Recently diagnosed esophageal cancer. Abdominal distention. Recent jejunostomy (5 days postop) tube with fluid leak concerning for ascites EXAM: CT ABDOMEN AND PELVIS WITH CONTRAST TECHNIQUE: Multidetector CT imaging of the abdomen and pelvis was performed using the standard protocol following bolus administration of intravenous contrast. CONTRAST:  100 cc Isovue-300 intravenous COMPARISON:  07/08/2017 FINDINGS: Lower chest: Airspace opacity in the right lower lobe. Improved airspace disease in the right middle lobe. Suspect aspiration given the chronically distended esophagus above the GE junction mass. Small bilateral pleural effusion. Hepatobiliary: Geographic low-density in segment 5 and 8 of the liver without visible underlying lesions seen. Negative gallbladder. Pancreas: Unremarkable. Spleen: Unremarkable. Adrenals/Urinary Tract: Negative adrenals. No hydronephrosis  or stone. Moderate scarring involving the upper pole left renal cortex. Bilateral simple appearing renal cysts. Unremarkable bladder. Stomach/Bowel: Recent jejunostomy in unremarkable position. The bowel is kinked at the level of the enterotomy and there is marked proximal small bowel and stomach distension. Small volume pneumoperitoneum which is expected after recent surgery. Distended lower esophagus above a GE junction mass. Colonic diverticulosis. Vascular/Lymphatic: Atherosclerosis with advanced atheromatous wall thickening of the aorta. No definite adenopathy. PET-CT would be more sensitive in this setting. Reproductive:No pathologic  findings. Other: No ascites or pneumoperitoneum. Musculoskeletal: No acute abnormalities. A call has been placed to the ordering provider. IMPRESSION: 1. High-grade proximal small bowel obstruction with transition at the jejunostomy tube. There is a degree of closed loop physiology given the GE junction mass and severe stenosis. 2. Dilated esophagus above the GE junction mass. There is shifting opacity at the right base concerning for recurrent aspiration. 3. Small pleural effusions. 4. Perfusion anomaly in the ventral right lobe liver without visible underlying cause. Attention on future staging scans. Electronically Signed   By: Marnee Spring M.D.   On: 07/20/2017 15:07   STUDIES:  CT chest/abdomen 1/11 > Patchy areas of nodular airspace density primarily in the right middle lobe and right upper lobe. Bilateral renal hypodense lesions, incompletely characterized. Ultrasound may provide better evaluation. No hydronephrosis or obstructing stone. 1/12 US renal > Left renal cysts. 1/16 EGD >  Malignant appearing esophageal stenosis,  1/23 CT abd > High-grade proximal small bowel obstruction with transition at the jejunostomy tube. There is a degree of closed loop physiology given the GE junction mass and severe stenosis. Dilated esophagus above the GE junction mass. There is shifting opacity at the right base concerning for recurrent aspiration. Small pleural effusions.Perfusion anomaly in the ventral right lobe liver without visible underlying cause. Attention on future staging scans.  CULTURES: 1/23 blood >>  ANTIBIOTICS: Azithro 1/11 >1/14 CTX 1/11 >1/14 Unasyn 1/14 >1/18 Cefazolin 1/18>1/19 Augmentin 1/18 > 1/22 Ertapenem 1/23 >>>  SIGNIFICANT EVENTS: 1/11 admit for nausea and vomiting 1/16 EGD demonstrated malignant esophageal stricture.  1/18 J tube placed 1/23 bowel obstruction, to OR for ex-lap  LINES/TUBES: J tube 1/18 > 1/23 >>> ETT 1/23 >>> Foley catheter 1/23>> OG tube  1/23>> PIV's  DISCUSSION: 71yoM with HTN, COPD, Chronic hypoxia on home O2, and Etoh abuse, admitted to [facility] on 1/11 with N/V, AKI, and CAP, and found to have esophageal adenocarcinoma. J tube placed 1/18, complicated by continued N/V, worsening abdominal distension, and ascites leaking from around the J-tube. CT on 1/23 demonstrated high grade proximal small bowel obstruction. He was taken to the OR this evening for Ex-lap and revision of jejunostomy. Post operatively he remained on the vent and was transferred to the ICU.   ASSESSMENT / PLAN:  PULMONARY 1. Acute-on-Chronic Hypoxic Respiratory failure; COPD; Aspiration pneumonia: - obtained ABG; made vent changes based on this ABG, decreasing RR from 16 to 14, and decreasing FIO2 from 100% to 60% - repeat ABG in AM - CXR on my review shows ETT in good position; continues to have a RLL infiltrate.  - He has been on multiple antibiotics for this pneumonia, initially treating with Ceftriaxone/Azithro as a CAP; however given the recurrent vomiting and the location of the infiltrate, it is more likely an aspiration pneumonia. He was later treated with Unasyn and Augmentin without improvement. Will obtain sputum culture, procalcitonin, and lactate. Blood cultures are pending. Continuing Ertapenem for now.  - is on an ICS/LABA at home;  will start pulmicort and brovana nebs BID and duonebs q6hrs. No wheezing on my exam; do not feel he is in exacerbation at this time.   CARDIOVASCULAR 1. Hx HTN: - currently normotensive; will hold home norvasc for now  RENAL 1. AKI; Hypokalemia: - creatinine up to 2.08 this admission, from baseline of normal >> improved with IVF's to 1.02 on 1/20, now upt o1.28 this AM. Repeat labs now. - continue foley catheter, strict I/O's, avoid nephrotoxic agents - mild hypokalemia this AM; repeat labs now. Obtain UA.   GASTROINTESTINAL 1. SBO; J-tube: - s/p ex-lap with J-tube revision tonight - OG tube still to suction -  appreciate surgery rec's.  HEMATOLOGIC No active issues   INFECTIOUS 1. Severe sepsis: - leukocytosis (WBC 24k preop) and lactic acidosis (2.4 now) unclear if due to the SBO itself or if any bowel ischemia present (none seen intraoperatively) or if there is any intraabdominal infection, in addition to the aspiration pneumonia - give 1L NS bolus now and continue NS gtt; trend lactate and procalcitonin. Blood cultures pending. Sputum culture ordered. - continue Ertapenem; MRSA nares negative so not adding vanc.   ENDOCRINE No active issues; Accuchecks q4hrs. NPO  NEUROLOGIC 1. Hx Etoh abuse:  - reportedly stopped drinking 1 month ago, per EMR. However since patient is currently intubated and cannot give history, and because no family is here to confirm or deny this, will start monitoring CIWA. - start IV Thiamine and Folate.    FAMILY  - Updates: no family present at ICU bedside - Inter-disciplinary family meet or Palliative Care meeting due by: 07/27/17  60 minutes critical care time  Milana Obey, MD  Pulmonary and Critical Care Medicine [facility] Pager: [phone]  07/20/2017, 6:50 PM

## 2017-07-21 ENCOUNTER — Encounter (HOSPITAL_COMMUNITY): Payer: Self-pay | Admitting: Certified Registered Nurse Anesthetist

## 2017-07-21 ENCOUNTER — Inpatient Hospital Stay (HOSPITAL_COMMUNITY): Payer: Medicare Other

## 2017-07-21 ENCOUNTER — Encounter (HOSPITAL_COMMUNITY): Admission: EM | Disposition: A | Discharge status: Skilled Nursing Facility | Payer: Self-pay | Source: Home / Self Care

## 2017-07-21 ENCOUNTER — Encounter (HOSPITAL_COMMUNITY): Payer: Self-pay | Admitting: General Surgery

## 2017-07-21 DIAGNOSIS — K56609 Unspecified intestinal obstruction, unspecified as to partial versus complete obstruction: Secondary | ICD-10-CM

## 2017-07-21 DIAGNOSIS — J95821 Acute postprocedural respiratory failure: Secondary | ICD-10-CM

## 2017-07-21 LAB — CBC WITH DIFFERENTIAL/PLATELET
BASOS ABS: 0 10*3/uL (ref 0.0–0.1)
Basophils Relative: 0 %
EOS ABS: 0 10*3/uL (ref 0.0–0.7)
EOS PCT: 0 %
HCT: 36.7 % — ABNORMAL LOW (ref 39.0–52.0)
Hemoglobin: 12.2 g/dL — ABNORMAL LOW (ref 13.0–17.0)
Lymphocytes Relative: 2 %
Lymphs Abs: 0.4 10*3/uL — ABNORMAL LOW (ref 0.7–4.0)
MCH: 29.6 pg (ref 26.0–34.0)
MCHC: 33.2 g/dL (ref 30.0–36.0)
MCV: 89.1 fL (ref 78.0–100.0)
Monocytes Absolute: 0.4 10*3/uL (ref 0.1–1.0)
Monocytes Relative: 2 %
Neutro Abs: 23 10*3/uL — ABNORMAL HIGH (ref 1.7–7.7)
Neutrophils Relative %: 96 %
PLATELETS: 162 10*3/uL (ref 150–400)
RBC: 4.12 MIL/uL — AB (ref 4.22–5.81)
RDW: 15.9 % — ABNORMAL HIGH (ref 11.5–15.5)
WBC: 23.8 10*3/uL — AB (ref 4.0–10.5)

## 2017-07-21 LAB — PROCALCITONIN: Procalcitonin: 0.36 ng/mL

## 2017-07-21 LAB — BASIC METABOLIC PANEL
Anion gap: 12 (ref 5–15)
BUN: 21 mg/dL — AB (ref 6–20)
CO2: 26 mmol/L (ref 22–32)
CREATININE: 1.22 mg/dL (ref 0.61–1.24)
Calcium: 8.2 mg/dL — ABNORMAL LOW (ref 8.9–10.3)
Chloride: 101 mmol/L (ref 101–111)
GFR calc non Af Amer: 59 mL/min — ABNORMAL LOW (ref >60)
Glucose, Bld: 94 mg/dL (ref 65–99)
POTASSIUM: 4.3 mmol/L (ref 3.5–5.1)
Sodium: 139 mmol/L (ref 135–145)

## 2017-07-21 LAB — GLUCOSE, CAPILLARY
GLUCOSE-CAPILLARY: 88 mg/dL (ref 65–99)
Glucose-Capillary: 76 mg/dL (ref 65–99)

## 2017-07-21 LAB — LACTIC ACID, PLASMA: LACTIC ACID, VENOUS: 2 mmol/L — AB (ref 0.5–1.9)

## 2017-07-21 SURGERY — CANCELLED PROCEDURE

## 2017-07-21 MED ORDER — HYDRALAZINE HCL 20 MG/ML IJ SOLN: 10.0000 mg | Freq: Four times a day (QID) | INTRAMUSCULAR | Status: DC | PRN

## 2017-07-21 MED ORDER — HALOPERIDOL LACTATE 5 MG/ML IJ SOLN: 2.0000 mg | Freq: Four times a day (QID) | INTRAMUSCULAR | Status: DC | PRN

## 2017-07-21 SURGICAL SUPPLY — 15 items

## 2017-07-21 NOTE — Progress Notes (Signed)
Nutrition Follow-up  DOCUMENTATION CODES:   Non-severe (moderate) malnutrition in context of chronic illness  INTERVENTION:  - Once TF able to be re-started, recommend Osmolite 1.5 @ 10 mL/hr and advance by 10 mL every 24 hours to reach goal rate of Osmolite 1.5 @ 60 mL/hr. At goal rate, this regimen will provide 2160 kcal (103% upper end of estimated kcal need), 90 grams of protein, and 1097 mL free water.    Patient at risk of refeeding syndrome. Once TF begins, please monitor magnesium, potassium, and phosphorus daily for at least 3 days, MD to replete as needed.   NUTRITION DIAGNOSIS:   Moderate Malnutrition related to chronic illness, nausea, vomiting(ETOH abuse) as evidenced by percent weight loss, energy intake < or equal to 75% for > or equal to 1 month, moderate fat depletion, moderate muscle depletion. -ongoing  GOAL:   Patient will meet greater than or equal to 90% of their needs -unmet  MONITOR:   Diet advancement, TF tolerance, Weight trends, Labs, Skin, I & O's  ASSESSMENT:   72 y.o. male with medical history significant of COPD, CRF on 4 liters he thinks at home, htn comes in with over a week of nausea and vomiting with no abdominal pain or blood in vomit.  No fevers.  No diarrhea. No cough.  No chest pain.  He says he gets nauseated even after drinking liquids.  Pt referred for admission for report of wt loss and his n/v.  Events: 1/13: Pt reports poor intakes for 10 days PTA. Was having N/V. 1/14: Esophageal stricture noted 1/16: EGD reveals malignant esophageal mass 1/17: biopsies confirm adenocarcinoma per GI note 1/18: J-tube placed 1/19: TF initiated, Osmolite 1.5 @ 20 ml/hr 1/20: TF stopped after episodes of N/V, Osmolite 1.5 restarted at 30 ml/hr, then stopped again. 1/23: ex lap and revision of J-tube d/t CT showing kink in small bowel causing SBO 1/24: extubated in AM   Pt remains NPO following surgery and was extubated at ~9:30 AM. Weight +7 lbs/3 kg  since 1/20 which is likely fluid related. Pt denies abdominal pain or nausea this AM. Per Surgery PA note this AM, plan to discuss with Surgeon when able to restart TF via J-tube. RD will monitor for this.   Medications reviewed; 1 mg IV folic acid/day, 20 mg IV Lasix x1 dose yesterday, 2 g IV Mg sulfate x1 run yesterday, 60 mg Solu-medrol x1 yesterday, 15 mL oral liquid multivitamin/day, 40 mg IV Protonix BID, 10 mEq IV KCl x6 runs yesterday, 100 mg IV thiamine/day.  Labs reviewed; CBGs: 88 and 76 mg/dL this AM, Ca: 8.2 mg/dL.  IVF: LR @ 100 mL/hr.     Diet Order:  Diet NPO time specified  EDUCATION NEEDS:   Education needs have been addressed  Skin:  Skin Assessment: Skin Integrity Issues: Skin Integrity Issues:: Incisions Incisions: abdominal (1/23)  Last BM:  1/22  Height:   Ht Readings from Last 1 Encounters:  07/20/17 5' 7.25" (1.708 m)    Weight:   Wt Readings from Last 1 Encounters:  07/21/17 138 lb 3.7 oz (62.7 kg)    Ideal Body Weight:  67.3 kg  BMI:  Body mass index is 21.49 kg/m.  Estimated Nutritional Needs:   Kcal:  1900-2100  Protein:  90-100g  Fluid:  2L/day      Jarome Matin, MS, RD, LDN, CNSC Inpatient Clinical Dietitian Pager # [phone] After hours/weekend pager # [phone]

## 2017-07-21 NOTE — Anesthesia Preprocedure Evaluation (Deleted)
Anesthesia Evaluation  Patient identified by MRN, date of birth, ID band Patient awake    Reviewed: Allergy & Precautions, NPO status , Patient's Chart, lab work & pertinent test results  Airway Mallampati: Intubated       Dental no notable dental hx.    Pulmonary COPD,  COPD inhaler and oxygen dependent, former smoker,    breath sounds clear to auscultation + decreased breath sounds      Cardiovascular hypertension, Pt. on medications Normal cardiovascular exam Rhythm:Regular Rate:Normal  ECG: ST   Neuro/Psych negative neurological ROS  negative psych ROS   GI/Hepatic (+)     substance abuse  alcohol use, esophageal cancer   Endo/Other  negative endocrine ROS  Renal/GU Renal disease  negative genitourinary   Musculoskeletal  (+) Arthritis , Osteoarthritis,  Gout   Abdominal   Peds negative pediatric ROS (+)  Hematology negative hematology ROS (+)   Anesthesia Other Findings   Reproductive/Obstetrics negative OB ROS                             Anesthesia Physical  Anesthesia Plan  ASA: IV  Anesthesia Plan: General   Post-op Pain Management:    Induction: Inhalational  PONV Risk Score and Plan: Treatment may vary due to age or medical condition  Airway Management Planned: Oral ETT  Additional Equipment: None  Intra-op Plan:   Post-operative Plan: Post-operative intubation/ventilation  Informed Consent: I have reviewed the patients History and Physical, chart, labs and discussed the procedure including the risks, benefits and alternatives for the proposed anesthesia with the patient or authorized representative who has indicated his/her understanding and acceptance.     Plan Discussed with: CRNA  Anesthesia Plan Comments: (  )        Anesthesia Quick Evaluation

## 2017-07-21 NOTE — Progress Notes (Signed)
[facility] Progress Note  Chief Complaint:    Esophageal mass   Subjective: Feels okay.   Objective:  Vital signs in last 24 hours: Temp:  [97.4 F (36.3 C)-98.2 F (36.8 C)] 97.6 F (36.4 C) (01/24 1132) Pulse Rate:  [88-101] 95 (01/24 0330) Resp:  [12-18] 12 (01/24 1200) BP: (81-183)/(48-99) 97/54 (01/24 1200) SpO2:  [93 %-100 %] 94 % (01/24 1200) FiO2 (%):  [40 %-100 %] 40 % (01/24 0747) Weight:  [138 lb 3.7 oz (62.7 kg)] 138 lb 3.7 oz (62.7 kg) (01/24 0319) Last BM Date: 07/19/17 General:   Alert, well-developed, white male in NAD EENT:  Normal hearing, non icteric sclera, conjunctive pink.  Heart:  Regular rate and rhythm; no murmurs. No lower extremity edema Pulm: Normal respiratory effort, lungs CTA bilaterally.  Abdomen:  Soft,  Nontender, active bs. .    Neurologic:  Alert and  oriented x4;  grossly normal neurologically. Psych:  Pleasant, cooperative.  Normal mood and affect.   Intake/Output from previous day: 01/23 0701 - 01/24 0700 In: 5184.1 [I.V.:3217.4; IV Piggyback:1966.7] Out: 6150 [Urine:1925; Drains:400; Blood:25] Intake/Output this shift: Total I/O In: 607.5 [I.V.:607.5] Out: 250 [Urine:250]  Lab Results: Recent Labs    07/20/17 0749 07/20/17 1956 07/21/17 0318  WBC 24.0* 21.6* 23.8*  HGB 15.1 13.4 12.2*  HCT 45.4 40.7 36.7*  PLT 180 175 162   BMET Recent Labs    07/20/17 0848 07/20/17 1956 07/21/17 0318  NA 140 139 139  K 3.5 3.3* 4.3  CL 99* 98* 101  CO2 29 30 26   GLUCOSE 86 92 94  BUN 19 19 21*  CREATININE 1.28* 1.30* 1.22  CALCIUM 9.0 8.5* 8.2*   LFT Recent Labs    07/20/17 1956  PROT 5.3*  ALBUMIN 2.5*  AST 27  ALT 15*  ALKPHOS 60  BILITOT 0.5   ASSESSMENT / PLAN:   72 yo male with GE junction mass. EGD several days ago, path not definitve for adenocarcinoma. Unable to proceed with repeat EGD with biopsies yesterday due tachycardia in 120s and in the 140s the day prior. Subsequently abd/pelvic  CT scan yesterday showed kinking and obstruction of the jejunum at J-tube site with massive dilation of prox jejunum and stomach and patient was taken to OR last night for exp lap and revision of jejunostomy. He was just extubated this morning.  -Will need to hold off on EGD for a few days ex-lap with revision of jejunostomy last night. This was explained to the patient. Will check in on him in a few days to re-evaluate for EGD.     Principal Problem:   Adenocarcinoma of esophagus ([place]) Active Problems:   Essential hypertension   COPD with hypoxia (HCC)   Chronic respiratory failure with hypoxia (HCC)   Intractable nausea and vomiting   CAP (community acquired pneumonia)   Lobar pneumonia ([place])   Hypomagnesemia   Hypokalemia   Protein-calorie malnutrition, severe (HCC)   Esophageal dysphagia   Esophageal mass   Esophageal stricture   Esophageal cancer (HCC)   Malnutrition of moderate degree   H/O exploratory laparotomy   Acute respiratory failure with hypoxia ([place])    LOS: 13 days   Tye Savoy ,NP 07/21/2017, 2:05 PM Pager number [phone]    Attending physician's note   I have taken an interval history, reviewed the chart and examined the patient. I agree with the Advanced Practitioner's note, impression and recommendations. Reconsider EGD in a few days when he has  recovered from ex-lap, jejunostomy revision and when his COPD and tachycardia are better controlled.   Lucio Edward, MD [facility] [phone] Mon-Fri 8a-5p [phone] after 5p, weekends, holidays

## 2017-07-21 NOTE — Procedures (Signed)
Extubation Procedure Note  Patient Details:   Name: George Stokes DOB: Nov 06, 1945 MRN: [id number]   Airway Documentation:     Evaluation  O2 sats: stable throughout Complications: No apparent complications Patient did tolerate procedure well. Bilateral Breath Sounds: Diminished, Rhonchi   Yes  Tamera Reason 07/21/2017, 9:27 AM

## 2017-07-21 NOTE — Progress Notes (Signed)
PULMONARY / CRITICAL CARE MEDICINE   Name: George Stokes MRN: [id number] DOB: 08-Aug-1945    ADMISSION DATE:  07/07/2017 CONSULTATION DATE:  07/20/2016  REFERRING MD:  Dr. Johna Sheriff  CHIEF COMPLAINT:  Post op vent dependence  HISTORY OF PRESENT ILLNESS:   72 year old male with PMH of HTN, COPD, Chronic hypoxia on home O2, and Etoh abuse, admitted to [facility] on 1/11 with complaints of nausea and vomiting since 12/23 failing outpatient treatment. At that time, also found to have AKI and CAP. Workup that admission included EGD, which demonstrated malignant esophageal stenosis. Biopsy revealed adenocarcinoma. J tube placed 1/18. Despite this, he continued to have nausea and vomiting, with worsening abdominal distension and found to have ascites leaking from around the J-tube. CT on 1/23 demonstrated high grade proximal small bowel obstruction. He was taken to the OR this evening for Ex-lap and revision of jejunostomy. Post operatively he remained on the vent and was transferred to the ICU.    SUBJECTIVE:  RN reports no acute events.  ~ 250 ml fluid from NGT overnight.  Pt has been on wean since 0800.  Minimal sedation.   VITAL SIGNS: BP (!) 111/59   Pulse 95   Temp 97.7 F (36.5 C) (Axillary)   Resp 15   Ht 5' 7.25" (1.708 m)   Wt 138 lb 3.7 oz (62.7 kg)   SpO2 95%   BMI 21.49 kg/m   VENTILATOR SETTINGS: Vent Mode: PSV FiO2 (%):  [40 %-100 %] 40 % Set Rate:  [14 bmp-16 bmp] 14 bmp Vt Set:  [530 mL] 530 mL PEEP:  [5 cmH20] 5 cmH20 Pressure Support:  [5 cmH20] 5 cmH20 Plateau Pressure:  [10 cmH20-12 cmH20] 10 cmH20  INTAKE / OUTPUT: I/O last 3 completed shifts: In: 5781.6 [I.V.:3814.9; IV Piggyback:1966.7] Out: 4098 [JXBJY:7829; Drains:400; Other:3800; Blood:25]  PHYSICAL EXAMINATION: General: thin adult male in NAD lying in bed on vent HEENT: MM pink/moist, ETT Neuro: Awake/alert, follows commands, writes messages / appropriate  CV: s1s2 rrr, no m/r/g PULM: even/non-labored, lungs  bilaterally clea  GI: midline abdominal incision dressing c/d/i, red rubber catheter on L / capped   Extremities: warm/dry, no edema  Skin: no rashes or lesions  LABS:  BMET Recent Labs  Lab 07/20/17 0848 07/20/17 1956 07/21/17 0318  NA 140 139 139  K 3.5 3.3* 4.3  CL 99* 98* 101  CO2 29 30 26   BUN 19 19 21*  CREATININE 1.28* 1.30* 1.22  GLUCOSE 86 92 94   Electrolytes Recent Labs  Lab 07/20/17 0848 07/20/17 1956 07/21/17 0318  CALCIUM 9.0 8.5* 8.2*  MG  --  1.3*  --   PHOS  --  3.2  --    CBC Recent Labs  Lab 07/20/17 0749 07/20/17 1956 07/21/17 0318  WBC 24.0* 21.6* 23.8*  HGB 15.1 13.4 12.2*  HCT 45.4 40.7 36.7*  PLT 180 175 162   Coag's No results for input(s): APTT, INR in the last 168 hours.  Sepsis Markers Recent Labs  Lab 07/20/17 1956 07/21/17 0318  LATICACIDVEN 2.3* 2.0*  PROCALCITON 0.43 0.36    ABG Recent Labs  Lab 07/20/17 1938  PHART 7.437  PCO2ART 44.4  PO2ART 238*    Liver Enzymes Recent Labs  Lab 07/20/17 1956  AST 27  ALT 15*  ALKPHOS 60  BILITOT 0.5  ALBUMIN 2.5*    Cardiac Enzymes No results for input(s): TROPONINI, PROBNP in the last 168 hours.  Glucose Recent Labs  Lab 07/20/17 1204 07/20/17 1600  07/20/17 1932 07/20/17 2315 07/21/17 0310 07/21/17 0751  GLUCAP 80 92 93 93 88 76   Imaging Dg Chest 1 View  Result Date: 07/20/2017 CLINICAL DATA:  Shortness of breath, weakness. History of esophageal malignancy, COPD on home oxygen, former smoker. EXAM: CHEST 1 VIEW COMPARISON:  Chest x-ray of July 19, 2017 FINDINGS: There is persistent infiltrate at the right lung base. There no large pleural effusion. The left lung is clear. The heart and pulmonary vascularity are normal. There is mild tortuosity of the ascending thoracic aorta. The bony thorax is unremarkable. IMPRESSION: COPD. Right basilar pneumonia little changed from yesterday's study. Electronically Signed   By: David  [place] M.D.   On: 07/20/2017  15:12   Ct Abdomen Pelvis W Contrast  Result Date: 07/20/2017 CLINICAL DATA:  Recently diagnosed esophageal cancer. Abdominal distention. Recent jejunostomy (5 days postop) tube with fluid leak concerning for ascites EXAM: CT ABDOMEN AND PELVIS WITH CONTRAST TECHNIQUE: Multidetector CT imaging of the abdomen and pelvis was performed using the standard protocol following bolus administration of intravenous contrast. CONTRAST:  100 cc Isovue-300 intravenous COMPARISON:  07/08/2017 FINDINGS: Lower chest: Airspace opacity in the right lower lobe. Improved airspace disease in the right middle lobe. Suspect aspiration given the chronically distended esophagus above the GE junction mass. Small bilateral pleural effusion. Hepatobiliary: Geographic low-density in segment 5 and 8 of the liver without visible underlying lesions seen. Negative gallbladder. Pancreas: Unremarkable. Spleen: Unremarkable. Adrenals/Urinary Tract: Negative adrenals. No hydronephrosis or stone. Moderate scarring involving the upper pole left renal cortex. Bilateral simple appearing renal cysts. Unremarkable bladder. Stomach/Bowel: Recent jejunostomy in unremarkable position. The bowel is kinked at the level of the enterotomy and there is marked proximal small bowel and stomach distension. Small volume pneumoperitoneum which is expected after recent surgery. Distended lower esophagus above a GE junction mass. Colonic diverticulosis. Vascular/Lymphatic: Atherosclerosis with advanced atheromatous wall thickening of the aorta. No definite adenopathy. PET-CT would be more sensitive in this setting. Reproductive:No pathologic findings. Other: No ascites or pneumoperitoneum. Musculoskeletal: No acute abnormalities. A call has been placed to the ordering provider. IMPRESSION: 1. High-grade proximal small bowel obstruction with transition at the jejunostomy tube. There is a degree of closed loop physiology given the GE junction mass and severe stenosis. 2.  Dilated esophagus above the GE junction mass. There is shifting opacity at the right base concerning for recurrent aspiration. 3. Small pleural effusions. 4. Perfusion anomaly in the ventral right lobe liver without visible underlying cause. Attention on future staging scans. Electronically Signed   By: Marnee Spring M.D.   On: 07/20/2017 15:07   Portable Chest Xray  Result Date: 07/21/2017 CLINICAL DATA:  Endotracheal tube positioning EXAM: PORTABLE CHEST 1 VIEW COMPARISON:  Yesterday FINDINGS: Endotracheal tube tip between the clavicular heads and carina. An orogastric tube reaches the stomach. Lung opacity at the right more than left base. Small pleural effusions by abdominal CT yesterday. Large lung volumes compatible with COPD. IMPRESSION: 1. Stable positioning of endotracheal and orogastric tubes. 2. Small pleural effusions and right more than left lower lobe opacity. Clinical circumstances primarily concerning for aspiration pneumonitis/pneumonia. Electronically Signed   By: Marnee Spring M.D.   On: 07/21/2017 07:07   Portable Chest X-ray  Result Date: 07/20/2017 CLINICAL DATA:  72 year old male with a history of endotracheal tube placement EXAM: PORTABLE CHEST 1 VIEW COMPARISON:  07/20/2017 abdominal CT 07/20/2017 FINDINGS: Cardiomediastinal silhouette unchanged in size and contour. Interval placement of endotracheal tube, terminating suitably above the carina approximately 4 cm.  Interval placement of gastric tube, terminating out of the field of view. Similar appearance of bibasilar opacities, more pronounced on the right. No pneumothorax IMPRESSION: Interval placement of endotracheal tube, terminating suitably above the carina. Interval placement of gastric tube, terminating out of the field of view. Similar appearance of right greater than left airspace disease Electronically Signed   By: Gilmer Mor D.O.   On: 07/20/2017 20:01   STUDIES:  CT chest/abdomen 1/11 > Patchy areas of nodular  airspace density primarily in the right middle lobe and right upper lobe. Bilateral renal hypodense lesions, incompletely characterized. Ultrasound may provide better evaluation. No hydronephrosis or obstructing stone. 1/12 US renal > Left renal cysts. 1/16 EGD >  Malignant appearing esophageal stenosis,  1/23 CT abd > High-grade proximal small bowel obstruction with transition at the jejunostomy tube. There is a degree of closed loop physiology given the GE junction mass and severe stenosis. Dilated esophagus above the GE junction mass. There is shifting opacity at the right base concerning for recurrent aspiration. Small pleural effusions.Perfusion anomaly in the ventral right lobe liver without visible underlying cause. Attention on future staging scans.  CULTURES: 1/23 blood >> 1/23 tracheal aspirate >>   ANTIBIOTICS: Azithro 1/11 > 1/14 CTX 1/11 > 1/14 Unasyn 1/14 > 1/18 Cefazolin 1/18 >1/19 Augmentin 1/18 > 1/22 Ertapenem 1/23 >>  SIGNIFICANT EVENTS: 1/11 admit for nausea and vomiting 1/16 EGD demonstrated malignant esophageal stricture.  1/18 J tube placed 1/23 bowel obstruction, to OR for ex-lap  LINES/TUBES: J tube 1/18 > 1/23 >> ETT 1/23 >> Foley catheter 1/23 >> OG tube 1/23 >>  DISCUSSION: 71yoM with HTN, COPD, Chronic hypoxia on home O2, and Etoh abuse, admitted to [facility] on 1/11 with N/V, AKI, and CAP, and found to have esophageal adenocarcinoma. J tube placed 1/18, complicated by continued N/V, worsening abdominal distension, and ascites leaking from around the J-tube. CT on 1/23 demonstrated high grade proximal small bowel obstruction. He was taken to the OR this evening for Ex-lap and revision of jejunostomy. Post operatively he remained on the vent and was transferred to the ICU.  Extubated 1/24 am.    ASSESSMENT / PLAN:  PULMONARY A: Acute-on-Chronic Hypoxic Respiratory failure COPD Aspiration pneumonia P: SBT with plan for extubation Pulmonary hygiene - IS,  mobilize  Continue brovana + pulmicort Duneb Q6  O2 as needed to support sats  Follow sputum, blood cultures   CARDIOVASCULAR A: Hx HTN P: Hold home norvasc PRN lopressor for HR PRN hydralazine for BP  RENAL A: AKI Hypokalemia P: LR @100ml /hr Trend BMP / urinary output Replace electrolytes as indicated Avoid nephrotoxic agents, ensure adequate renal perfusion  GASTROINTESTINAL A: SBO Nausea / Vomiting  J-tube S/P Ex-Lap with J-Tube Revision  P: NPO  OK with CCS for extubation  Discontinue OGT > ok per CCS PPI PRN zofran  HEMATOLOGIC A: Anemia P: Trend CBC Heparin sq + SCD's for DVT prophylaxis   INFECTIOUS A: Severe sepsis P: Continue Ertapenem  Monitor fever curve / WBC trend Trend PCT  ENDOCRINE A: At Risk for Hyper / Hypoglycemia P: SSI  May need dextrose in IVF  NEUROLOGIC A: Hx Etoh Abuse P: Monitor for ETOH withdrawal Thiamine / folate  CIWA protocol   FAMILY  - Updates: No family at bedside - Inter-disciplinary family meet or Palliative Care meeting due by: 07/27/17   Have asked [facility] to assist with medial management for CCS.  PCCM will sign off.   Canary Brim, NP-C [facility] Pgr: [phone] or if no answer [phone] 07/21/2017, 9:15 AM

## 2017-07-21 NOTE — Progress Notes (Signed)
[facility] Progress Note  1 Day Post-Op  Subjective: CC: no complaints  Patient with some mild abdominal pain with coughing. No nausea. Was just extubated, denies feeling SOB. No flatus. Would like to know when he'll be able to have some ice chips.   Objective: Vital signs in last 24 hours: Temp:  [97.4 F (36.3 C)-98.2 F (36.8 C)] 97.7 F (36.5 C) (01/24 0740) Pulse Rate:  [88-125] 95 (01/24 0330) Resp:  [14-20] 15 (01/24 0730) BP: (81-183)/(48-99) 111/59 (01/24 0700) SpO2:  [90 %-100 %] 95 % (01/24 0747) FiO2 (%):  [40 %-100 %] 40 % (01/24 0747) Weight:  [62.1 kg (137 lb)-62.7 kg (138 lb 3.7 oz)] 62.7 kg (138 lb 3.7 oz) (01/24 0319) Last BM Date: 07/19/17  Intake/Output from previous day: 01/23 0701 - 01/24 0700 In: 5184.1 [I.V.:3217.4; IV Piggyback:1966.7] Out: 6150 [UMPNT:6144; Drains:400; Blood:25] Intake/Output this shift: No intake/output data recorded.  PE: Gen:  Alert, NAD, pleasant Card:  Regular rate and rhythm, pedal pulses 2+ BL Pulm:  Normal effort, sats in the low 90s Abd: Soft, non-tender, non-distended, bowel sounds hypoactive, no HSM, incision C/D/I Skin: warm and dry, no rashes  Psych: A&Ox3   Lab Results:  Recent Labs    07/20/17 1956 07/21/17 0318  WBC 21.6* 23.8*  HGB 13.4 12.2*  HCT 40.7 36.7*  PLT 175 162   BMET Recent Labs    07/20/17 1956 07/21/17 0318  NA 139 139  K 3.3* 4.3  CL 98* 101  CO2 30 26  GLUCOSE 92 94  BUN 19 21*  CREATININE 1.30* 1.22  CALCIUM 8.5* 8.2*   PT/INR No results for input(s): LABPROT, INR in the last 72 hours. CMP     Component Value Date/Time   NA 139 07/21/2017 0318   NA 143 06/29/2017 1508   K 4.3 07/21/2017 0318   CL 101 07/21/2017 0318   CO2 26 07/21/2017 0318   GLUCOSE 94 07/21/2017 0318   BUN 21 (H) 07/21/2017 0318   BUN 28 (H) 06/29/2017 1508   CREATININE 1.22 07/21/2017 0318   CREATININE 1.07 05/01/2015 1508   CALCIUM 8.2 (L) 07/21/2017 0318   PROT 5.3 (L) 07/20/2017  1956   PROT 7.3 06/29/2017 1508   ALBUMIN 2.5 (L) 07/20/2017 1956   ALBUMIN 4.5 06/29/2017 1508   AST 27 07/20/2017 1956   ALT 15 (L) 07/20/2017 1956   ALKPHOS 60 07/20/2017 1956   BILITOT 0.5 07/20/2017 1956   BILITOT 0.6 06/29/2017 1508   GFRNONAA 59 (L) 07/21/2017 0318   GFRNONAA 71 05/01/2015 1508   GFRAA >60 07/21/2017 0318   GFRAA 83 05/01/2015 1508   Lipase     Component Value Date/Time   LIPASE 39 07/12/2017 0342       Studies/Results: Dg Chest 1 View  Result Date: 07/20/2017 CLINICAL DATA:  Shortness of breath, weakness. History of esophageal malignancy, COPD on home oxygen, former smoker. EXAM: CHEST 1 VIEW COMPARISON:  Chest x-ray of July 19, 2017 FINDINGS: There is persistent infiltrate at the right lung base. There no large pleural effusion. The left lung is clear. The heart and pulmonary vascularity are normal. There is mild tortuosity of the ascending thoracic aorta. The bony thorax is unremarkable. IMPRESSION: COPD. Right basilar pneumonia little changed from yesterday's study. Electronically Signed   By: David  Martinique M.D.   On: 07/20/2017 15:12   Ct Abdomen Pelvis W Contrast  Result Date: 07/20/2017 CLINICAL DATA:  Recently diagnosed esophageal cancer. Abdominal distention. Recent jejunostomy (5 days  postop) tube with fluid leak concerning for ascites EXAM: CT ABDOMEN AND PELVIS WITH CONTRAST TECHNIQUE: Multidetector CT imaging of the abdomen and pelvis was performed using the standard protocol following bolus administration of intravenous contrast. CONTRAST:  100 cc Isovue-300 intravenous COMPARISON:  07/08/2017 FINDINGS: Lower chest: Airspace opacity in the right lower lobe. Improved airspace disease in the right middle lobe. Suspect aspiration given the chronically distended esophagus above the GE junction mass. Small bilateral pleural effusion. Hepatobiliary: Geographic low-density in segment 5 and 8 of the liver without visible underlying lesions seen. Negative  gallbladder. Pancreas: Unremarkable. Spleen: Unremarkable. Adrenals/Urinary Tract: Negative adrenals. No hydronephrosis or stone. Moderate scarring involving the upper pole left renal cortex. Bilateral simple appearing renal cysts. Unremarkable bladder. Stomach/Bowel: Recent jejunostomy in unremarkable position. The bowel is kinked at the level of the enterotomy and there is marked proximal small bowel and stomach distension. Small volume pneumoperitoneum which is expected after recent surgery. Distended lower esophagus above a GE junction mass. Colonic diverticulosis. Vascular/Lymphatic: Atherosclerosis with advanced atheromatous wall thickening of the aorta. No definite adenopathy. PET-CT would be more sensitive in this setting. Reproductive:No pathologic findings. Other: No ascites or pneumoperitoneum. Musculoskeletal: No acute abnormalities. A call has been placed to the ordering provider. IMPRESSION: 1. High-grade proximal small bowel obstruction with transition at the jejunostomy tube. There is a degree of closed loop physiology given the GE junction mass and severe stenosis. 2. Dilated esophagus above the GE junction mass. There is shifting opacity at the right base concerning for recurrent aspiration. 3. Small pleural effusions. 4. Perfusion anomaly in the ventral right lobe liver without visible underlying cause. Attention on future staging scans. Electronically Signed   By: Monte Fantasia M.D.   On: 07/20/2017 15:07   Dg Abdomen Peg Tube Location  Result Date: 07/19/2017 CLINICAL DATA:  Jejunostomy tube leaking. EXAM: ABDOMEN - 1 VIEW COMPARISON:  07/19/2016. FINDINGS: KUB following nonionic contrast administration into enterostomy tube obtained. Enterostomy tube tip noted within a right-sided abdominal small-bowel loop. No definite leakage noted. Contrast from prior study noted in the colon. IMPRESSION: Enterostomy tube tip noted in a small-bowel loop in the right abdomen. Electronically Signed   By:  Marcello Moores  Register   On: 07/19/2017 16:41   Portable Chest Xray  Result Date: 07/21/2017 CLINICAL DATA:  Endotracheal tube positioning EXAM: PORTABLE CHEST 1 VIEW COMPARISON:  Yesterday FINDINGS: Endotracheal tube tip between the clavicular heads and carina. An orogastric tube reaches the stomach. Lung opacity at the right more than left base. Small pleural effusions by abdominal CT yesterday. Large lung volumes compatible with COPD. IMPRESSION: 1. Stable positioning of endotracheal and orogastric tubes. 2. Small pleural effusions and right more than left lower lobe opacity. Clinical circumstances primarily concerning for aspiration pneumonitis/pneumonia. Electronically Signed   By: Monte Fantasia M.D.   On: 07/21/2017 07:07   Portable Chest X-ray  Result Date: 07/20/2017 CLINICAL DATA:  72 year old male with a history of endotracheal tube placement EXAM: PORTABLE CHEST 1 VIEW COMPARISON:  07/20/2017 abdominal CT 07/20/2017 FINDINGS: Cardiomediastinal silhouette unchanged in size and contour. Interval placement of endotracheal tube, terminating suitably above the carina approximately 4 cm. Interval placement of gastric tube, terminating out of the field of view. Similar appearance of bibasilar opacities, more pronounced on the right. No pneumothorax IMPRESSION: Interval placement of endotracheal tube, terminating suitably above the carina. Interval placement of gastric tube, terminating out of the field of view. Similar appearance of right greater than left airspace disease Electronically Signed   By: York Cerise  Earleen Newport D.O.   On: 07/20/2017 20:01   Dg Chest Port 1 View  Result Date: 07/19/2017 CLINICAL DATA:  Shortness of breath. EXAM: PORTABLE CHEST 1 VIEW COMPARISON:  CT 07/08/2016.  Chest x-ray 01/13/2016. FINDINGS: Mediastinum hilar structures normal. Cardiomegaly with normal pulmonary vascularity. COPD. Right base infiltrate with small right pleural effusion. IMPRESSION: 1. Right base infiltrate  consistent with pneumonia. Small right pleural effusion. Similar findings noted on prior CT of 07/08/2016. 2.  COPD. Electronically Signed   By: Marcello Moores  Register   On: 07/19/2017 13:51   Dg Abd Portable 1v  Result Date: 07/19/2017 CLINICAL DATA:  COPD, nausea vomiting EXAM: PORTABLE ABDOMEN - 1 VIEW COMPARISON:  None. FINDINGS: No dilated large or small bowel. Jejunostomy tube with tip in the RIGHT lower quadrant. Midline skin staples noted. No evidence bowel obstruction. Oral contrast from prior CT within the colon IMPRESSION: 1. No evidence of bowel obstruction. 2. Jejunostomy tube in place. Electronically Signed   By: Suzy Bouchard M.D.   On: 07/19/2017 10:34    Anti-infectives: Anti-infectives (From admission, onward)   Start     Dose/Rate Route Frequency Ordered Stop   07/20/17 1400  ertapenem (INVANZ) 1 g in sodium chloride 0.9 % 50 mL IVPB     1 g 100 mL/hr over 30 Minutes Intravenous Every 24 hours 07/20/17 1209     07/16/17 0000  ceFAZolin (ANCEF) IVPB 2g/100 mL premix     2 g 200 mL/hr over 30 Minutes Intravenous Every 8 hours 07/15/17 1833 07/16/17 0054   07/15/17 0945  amoxicillin-clavulanate (AUGMENTIN) 250-62.5 MG/5ML suspension 500 mg     500 mg Oral Every 8 hours 07/15/17 0938 07/19/17 0928   07/15/17 0600  ceFAZolin (ANCEF) IVPB 2g/100 mL premix     2 g 200 mL/hr over 30 Minutes Intravenous On call to O.R. 07/14/17 1809 07/15/17 1632   07/11/17 1600  ampicillin-sulbactam (UNASYN) 1.5 g in sodium chloride 0.9 % 50 mL IVPB  Status:  Discontinued     1.5 g 100 mL/hr over 30 Minutes Intravenous Every 6 hours 07/11/17 1329 07/15/17 0940   07/08/17 1000  cefTRIAXone (ROCEPHIN) 1 g in dextrose 5 % 50 mL IVPB  Status:  Discontinued     1 g 100 mL/hr over 30 Minutes Intravenous Every 24 hours 07/08/17 0954 07/11/17 1315   07/08/17 1000  azithromycin (ZITHROMAX) 500 mg in dextrose 5 % 250 mL IVPB  Status:  Discontinued     500 mg 250 mL/hr over 60 Minutes Intravenous Every 24  hours 07/08/17 0954 07/11/17 1315       Assessment/Plan COPD oxygen dependent Alcohol abuse Malnutrition  Malignant esophageal stenosis/adenocarcinoma of the esophagus S/p Feeding jejunostomy1/18 Dr. Dalbert Batman 1/23 CT showed kink in small bowel at the level of jejunostomy causing SBO S/p ex-lap with revision of jejunostomy 1/23 Dr. Excell Seltzer  - POD#1 - just extubated, doing well  - will discuss with MD when we can restart TF and ice chips  - mobilize as tolerated   ID - ertapenem 1/23>> FEN - NPO, IVF VTE - SCDs, SQ heparin  Foley - d/c today  Follow up - Dr. Dalbert Batman    LOS: 13 days    Brigid Re , [facility] Surgery 07/21/2017, 9:21 AM Pager: [phone] Consults: [phone] Mon-Fri 7:00 am-4:30 pm Sat-Sun 7:00 am-11:30 am

## 2017-07-21 NOTE — Progress Notes (Signed)
PT Cancellation Note  Patient Details Name: George Stokes MRN: [id number] DOB: [birth year]   Cancelled Treatment:     Emergency EXP LAP yesterday for J Tube revision then EGD this morning.  Will check back another day.   Rica Koyanagi  PTA [facility]  Acute  Rehab Pager      [phone]

## 2017-07-21 NOTE — Care Management Note (Signed)
Case Management Note  Patient Details  Name: George Stokes MRN: [id number] Date of Birth: [birth year]  Subjective/Objective:                  Postop resp failure on vent overnight then extubated  Action/Plan: Date: July 21, 2017 Velva Harman, BSN, [place], [place] Chart and notes review for patient progress and needs. Will follow for case management and discharge needs. No cm or discharge needs present at time of this review. Next review date: [id number]  Expected Discharge Date:  (unknown)               Expected Discharge Plan:  Home/Self Care  In-House Referral:     Discharge planning Services  CM Consult  Post Acute Care Choice:    Choice offered to:     DME Arranged:    DME Agency:     HH Arranged:    HH Agency:     Status of Service:  In process, will continue to follow  If discussed at Long Length of Stay Meetings, dates discussed:    Additional Comments:  Leeroy Cha, RN 07/21/2017, 10:20 AM

## 2017-07-22 ENCOUNTER — Inpatient Hospital Stay (HOSPITAL_COMMUNITY): Payer: Medicare Other

## 2017-07-22 DIAGNOSIS — E43 Unspecified severe protein-calorie malnutrition: Secondary | ICD-10-CM

## 2017-07-22 DIAGNOSIS — J9611 Chronic respiratory failure with hypoxia: Secondary | ICD-10-CM

## 2017-07-22 LAB — BLOOD GAS, ARTERIAL
ACID-BASE EXCESS: 3.7 mmol/L — AB (ref 0.0–2.0)
BICARBONATE: 32.9 mmol/L — AB (ref 20.0–28.0)
Drawn by: 270211
FIO2: 1
O2 Saturation: 89.2 %
PATIENT TEMPERATURE: 37
PCO2 ART: 76.3 mmHg — AB (ref 32.0–48.0)
pH, Arterial: 7.257 — ABNORMAL LOW (ref 7.350–7.450)
pO2, Arterial: 70.1 mmHg — ABNORMAL LOW (ref 83.0–108.0)

## 2017-07-22 LAB — GLUCOSE, CAPILLARY
Glucose-Capillary: 61 mg/dL — ABNORMAL LOW (ref 65–99)
Glucose-Capillary: 86 mg/dL (ref 65–99)
Glucose-Capillary: 80 mg/dL (ref 65–99)

## 2017-07-22 LAB — CBC
HEMATOCRIT: 39.2 % (ref 39.0–52.0)
HEMOGLOBIN: 12.8 g/dL — AB (ref 13.0–17.0)
MCH: 29.4 pg (ref 26.0–34.0)
MCHC: 32.7 g/dL (ref 30.0–36.0)
MCV: 90.1 fL (ref 78.0–100.0)
Platelets: 217 10*3/uL (ref 150–400)
RBC: 4.35 MIL/uL (ref 4.22–5.81)
RDW: 16.2 % — ABNORMAL HIGH (ref 11.5–15.5)
WBC: 23.8 10*3/uL — ABNORMAL HIGH (ref 4.0–10.5)

## 2017-07-22 LAB — BASIC METABOLIC PANEL
ANION GAP: 11 (ref 5–15)
BUN: 19 mg/dL (ref 6–20)
CO2: 30 mmol/L (ref 22–32)
Calcium: 8.6 mg/dL — ABNORMAL LOW (ref 8.9–10.3)
Chloride: 100 mmol/L — ABNORMAL LOW (ref 101–111)
Creatinine, Ser: 0.97 mg/dL (ref 0.61–1.24)
GFR calc Af Amer: >60 mL/min (ref >60)
GLUCOSE: 65 mg/dL (ref 65–99)
POTASSIUM: 3.9 mmol/L (ref 3.5–5.1)
Sodium: 141 mmol/L (ref 135–145)

## 2017-07-22 LAB — PROCALCITONIN: Procalcitonin: 0.22 ng/mL

## 2017-07-22 LAB — LACTIC ACID, PLASMA: Lactic Acid, Venous: 1 mmol/L (ref 0.5–1.9)

## 2017-07-22 MED ORDER — KCL IN DEXTROSE-NACL 20-5-0.45 MEQ/L-%-% IV SOLN
INTRAVENOUS | Status: DC
  Administered 2017-07-22 – 2017-07-24 (×3): via INTRAVENOUS
  Filled 2017-07-22 (×5): qty 1000

## 2017-07-22 MED ORDER — IPRATROPIUM-ALBUTEROL 0.5-2.5 (3) MG/3ML IN SOLN
3.0000 mL | RESPIRATORY_TRACT | Status: DC | PRN
  Administered 2017-07-23 – 2017-07-26 (×3): 3 mL via RESPIRATORY_TRACT
  Filled 2017-07-22 (×3): qty 3

## 2017-07-22 MED ORDER — DEXTROSE 50 % IV SOLN
INTRAVENOUS | Status: AC
  Administered 2017-07-22: 25 mL
  Filled 2017-07-22: qty 50

## 2017-07-22 MED ORDER — LORAZEPAM 2 MG/ML IJ SOLN: 1.0000 mg | INTRAMUSCULAR | Status: DC | PRN

## 2017-07-22 NOTE — Progress Notes (Signed)
Pt requesting to come off BIPAP at shift change.  Pt placed on 8 LPM [place].  Pt currently tolerating well at this time, pt alert and oriented.  RT to monitor and assess as needed.

## 2017-07-22 NOTE — Progress Notes (Signed)
late entry CBG at 0755 3, pt confused, skin warm and dry.  25 cc (12.5 Gm) Dextrose given, repeat CBG 86 MD notified.  IV fluids changed to D5 1/2 with 20 KCL at 75 cc/Hr

## 2017-07-22 NOTE — Progress Notes (Addendum)
PROGRESS NOTE    George Stokes  ZOX:[id number] DOB: 1945/10/14 DOA: 07/07/2017 PCP: Patient, No Pcp Per   Brief Narrative: George Stokes is an 72 y.o. male past medical history significant for COPD on home oxygen comes for 1 week of nausea vomiting.He denied any abdominal pain or hematemesis or  Diarrhea on presentation.    Patient underwent EGD by GI .Found to have malignant  esophageal stenosis.  Marland KitchenBiopsy showed atypical cells suspicious for adenocarcinoma.  Radiation oncology has been consulted. EGD performed on 07/13/2016. Consulted general surgery, and feeding jejunostomy tube placed on 07/15/2017. CT abdomen/pelvis done on 07/20/17 showed High-grade proximal small bowel obstruction with transition at the jejunostomy tube.Dilated esophagus above the GE junction .Opacity at the right base concerning for recurrent aspiration.  Patient underwent urgent exploratory laparotomy with revision of jejunostomy on 1/23 by Dr. Johna Sheriff.  After the surgery patient could not be liberated from ventilation so had to be admitted to ICU. Patient was extubated on 07/21/17 and was handed over to the hospitalist team     Assessment & Plan:   Principal Problem:   Adenocarcinoma of esophagus (HCC) Active Problems:   Essential hypertension   COPD with hypoxia (HCC)   Chronic respiratory failure with hypoxia (HCC)   Intractable nausea and vomiting   CAP (community acquired pneumonia)   Lobar pneumonia (HCC)   Hypomagnesemia   Hypokalemia   Protein-calorie malnutrition, severe (HCC)   Esophageal dysphagia   Esophageal mass   Esophageal stricture   Esophageal cancer (HCC)   Malnutrition of moderate degree   H/O exploratory laparotomy   Acute respiratory failure with hypoxia (HCC)  Acute respiratory failure with hypoxia and hypercarbia : Extubated on 07/21/17.  ABG done this morning showed respiratory acidosis.  Patient was very lethargic on BiPAP this morning. Found that he was also given Ativan for  agitation. There was concern for reintubation this morning.  PCCM following Respiratory failure secondary to underlying COPD and pneumonia.  Chest x-ray done this morning showed increased consultation on the right and possible right pleural effusion. We will continue to monitor his respiratory status.  Continue BiPAP for now. I did budesonide and Brovana by PCCM.  Continue bronchodilators.  Adenocarcinoma of the GE junction: Presented with intractable nausea and vomiting secondary to malignant esophageal stenosis. GI was consulted and  EGD performed on 07/13/2016. Consulted general surgery, and feeding jejunostomy tube placed on 07/15/2017. Again started having nausea & vomiting since  07/19/16 so placed him  N.p.o. Stopped tube feeding. CT abdomen/pelvis done on 07/20/17 showed High-grade proximal small bowel obstruction with transition at the jejunostomy tube.Dilated esophagus above the GE junction .  Underwent exploratory laparotomy with revision of jejunostomy. Plan was to repeat EGD for additional biopsies due to path report not definitive for adenocarcinoma.  But procedure was not done  because patient was tachycardic. Patient has maintained appearing mass at the [facility] which is clinically and adenocarcinoma.  Pathology reading was not definitive.  Pathology report documented as small, mildly atypical mucosal fragments suspicious for adenocarcinoma. Oncology and radiation oncology following.  Plan is to start on concurrent radiation chemotherapy.He might be a candidate for esophagectomy following neoadjuvant treatment. Patient will likely undergo EGD after stabilization of his respiratory status. Currently patient is n.p.o.  Plan is  to start on clear liquid diet after her respiratory status stabilizes.  Sepsis/Aspiration pneumonia: Chest x-ray today showed increased right lower lobe opacity. Currently on Invanz. Also has leukocytosis .  Remains afebrile.  Cultures negative so far.  Acute  kidney  injury: Continue gentle IV fluids  HTN: On as needed Lopressor and hydralazine.  Amlodipine on hold  Malnutrition of moderate degree: Nutrition following.  History of alcohol abuse: Monitor for alcohol withdrawal.  Started on thiamine and folic acid.  Continue serial protocol.  Anemia: Currently H and H  is stable.  We will continue to monitor  DVT prophylaxis: SCD Code Status: Full Family Communication: Family member present on the bedside Disposition Plan: Unknown at this time   Consultants: General surgery, radiation oncology, oncology, GI,PCCM  Procedures: EGD, jejunostomy tube placement  Antimicrobials: Invanz since 07/20/17  Subjective: Patient seen and examined at the bedside.  He was in severe respiratory distress this morning.  On BiPAP.  Patient was more lethargic.  Unable to communicate  Objective: Vitals:   07/22/17 0924 07/22/17 1007 07/22/17 1209 07/22/17 1328  BP:  (!) 144/80 125/72   Pulse:    (!) 101  Resp: (!) 29 17 20 18   Temp:   97.6 F (36.4 C)   TempSrc:   Axillary   SpO2: 91% 93% 93% 92%  Weight:      Height:        Intake/Output Summary (Last 24 hours) at 07/22/2017 1518 Last data filed at 07/22/2017 1407 Gross per 24 hour  Intake 3129.63 ml  Output 325 ml  Net 2804.63 ml   Filed Weights   07/20/17 1004 07/21/17 0319 07/22/17 0500  Weight: 62.1 kg (137 lb) 62.7 kg (138 lb 3.7 oz) 63.5 kg (139 lb 15.9 oz)    Examination:  General exam: Cachectic, chronically ill Respiratory system: Severe respiratory distress, bilateral decreased air entry, bilateral expiratory wheezes  cardiovascular system: Sinus tachycardia, S1 & S2 heard, RRR. No JVD, murmurs, rubs, gallops or clicks. No pedal edema. Gastrointestinal system: Abdomen is distended, soft and nontender. No organomegaly or masses felt. J tube,sutures, upper midline incision Central nervous system:Not  Alert and oriented. No focal neurological deficits. Extremities: No edema, no clubbing ,no  cyanosis, distal peripheral pulses palpable. Skin: No rashes, lesions or ulcers,no icterus ,no pallor     Data Reviewed: I have personally reviewed following labs and imaging studies  CBC: Recent Labs  Lab 07/17/17 0253 07/20/17 0749 07/20/17 1956 07/21/17 0318 07/22/17 0316  WBC 11.9* 24.0* 21.6* 23.8* 23.8*  NEUTROABS  --   --  21.1* 23.0*  --   HGB 14.1 15.1 13.4 12.2* 12.8*  HCT 43.2 45.4 40.7 36.7* 39.2  MCV 88.9 88.8 89.6 89.1 90.1  PLT 193 180 175 162 217   Basic Metabolic Panel: Recent Labs  Lab 07/17/17 0253 07/20/17 0848 07/20/17 1956 07/21/17 0318 07/22/17 0316  NA 138 140 139 139 141  K 4.0 3.5 3.3* 4.3 3.9  CL 101 99* 98* 101 100*  CO2 27 29 30 26 30   GLUCOSE 113* 86 92 94 65  BUN 10 19 19  21* 19  CREATININE 1.02 1.28* 1.30* 1.22 0.97  CALCIUM 8.7* 9.0 8.5* 8.2* 8.6*  MG  --   --  1.3*  --   --   PHOS  --   --  3.2  --   --    GFR: Estimated Creatinine Clearance: 64.6 mL/min (by C-G formula based on SCr of 0.97 mg/dL). Liver Function Tests: Recent Labs  Lab 07/20/17 1956  AST 27  ALT 15*  ALKPHOS 60  BILITOT 0.5  PROT 5.3*  ALBUMIN 2.5*   No results for input(s): LIPASE, AMYLASE in the last 168 hours. No results for input(s): AMMONIA  in the last 168 hours. Coagulation Profile: No results for input(s): INR, PROTIME in the last 168 hours. Cardiac Enzymes: No results for input(s): CKTOTAL, CKMB, CKMBINDEX, TROPONINI in the last 168 hours. BNP (last 3 results) No results for input(s): PROBNP in the last 8760 hours. HbA1C: No results for input(s): HGBA1C in the last 72 hours. CBG: Recent Labs  Lab 07/21/17 0310 07/21/17 0751 07/22/17 0749 07/22/17 0826 07/22/17 1131  GLUCAP 88 76 61* 86 80   Lipid Profile: No results for input(s): CHOL, HDL, LDLCALC, TRIG, CHOLHDL, LDLDIRECT in the last 72 hours. Thyroid Function Tests: No results for input(s): TSH, T4TOTAL, FREET4, T3FREE, THYROIDAB in the last 72 hours. Anemia Panel: No results  for input(s): VITAMINB12, FOLATE, FERRITIN, TIBC, IRON, RETICCTPCT in the last 72 hours. Sepsis Labs: Recent Labs  Lab 07/20/17 1956 07/21/17 0318 07/22/17 0316  PROCALCITON 0.43 0.36 0.22  LATICACIDVEN 2.3* 2.0*  --     Recent Results (from the past 240 hour(s))  Culture, blood (routine x 2)     Status: None (Preliminary result)   Collection Time: 07/20/17  3:58 PM  Result Value Ref Range Status   Specimen Description BLOOD RIGHT ANTECUBITAL  Final   Special Requests   Final    BOTTLES DRAWN AEROBIC ONLY Blood Culture adequate volume   Culture   Final    NO GROWTH 2 DAYS Performed at [facility], [address] [place], [place] [address]    Report Status PENDING  Incomplete  Culture, blood (routine x 2)     Status: None (Preliminary result)   Collection Time: 07/20/17  3:58 PM  Result Value Ref Range Status   Specimen Description BLOOD RIGHT HAND  Final   Special Requests   Final    BOTTLES DRAWN AEROBIC ONLY Blood Culture adequate volume   Culture   Final    NO GROWTH 2 DAYS Performed at [facility], [address], [place], [place] [address]    Report Status PENDING  Incomplete  MRSA PCR Screening     Status: None   Collection Time: 07/20/17  6:40 PM  Result Value Ref Range Status   MRSA by PCR NEGATIVE NEGATIVE Final    Comment:        The GeneXpert MRSA Assay (FDA approved for NASAL specimens only), is one component of a comprehensive MRSA colonization surveillance program. It is not intended to diagnose MRSA infection nor to guide or monitor treatment for MRSA infections.   Culture, respiratory (NON-Expectorated)     Status: None (Preliminary result)   Collection Time: 07/20/17  7:51 PM  Result Value Ref Range Status   Specimen Description TRACHEAL ASPIRATE  Final   Special Requests NONE  Final   Gram Stain   Final    ABUNDANT WBC PRESENT, PREDOMINANTLY PMN FEW YEAST Performed at [facility], [address] [place], [place]  [address]    Culture MODERATE YEAST  Final   Report Status PENDING  Incomplete         Radiology Studies: Dg Chest Port 1 View  Result Date: 07/22/2017 CLINICAL DATA:  Status postextubation.  Airspace consolidation. EXAM: PORTABLE CHEST 1 VIEW COMPARISON:  July 21, 2017 FINDINGS: Endotracheal tube and nasogastric tube have been removed. No pneumothorax. There is airspace consolidation throughout the right mid lower lung zones, increased from 1 day prior. There is a small right pleural effusion. The left lung is somewhat hyperexpanded. There is no edema or consolidation on the left.  Heart size and pulmonary vascular normal. No adenopathy appreciable. Old healed fractures noted on the left. IMPRESSION: No pneumothorax. Increase in airspace consolidation in the right mid and lower lung zones with small right pleural effusion. Question progression of pneumonia versus aspiration. Both entities may be present concurrently. Left lung hyperexpanded but clear. Stable cardiac silhouette. Electronically Signed   By: Bretta Bang III M.D.   On: 07/22/2017 07:32   Portable Chest Xray  Result Date: 07/21/2017 CLINICAL DATA:  Endotracheal tube positioning EXAM: PORTABLE CHEST 1 VIEW COMPARISON:  Yesterday FINDINGS: Endotracheal tube tip between the clavicular heads and carina. An orogastric tube reaches the stomach. Lung opacity at the right more than left base. Small pleural effusions by abdominal CT yesterday. Large lung volumes compatible with COPD. IMPRESSION: 1. Stable positioning of endotracheal and orogastric tubes. 2. Small pleural effusions and right more than left lower lobe opacity. Clinical circumstances primarily concerning for aspiration pneumonitis/pneumonia. Electronically Signed   By: Marnee Spring M.D.   On: 07/21/2017 07:07   Portable Chest X-ray  Result Date: 07/20/2017 CLINICAL DATA:  72 year old male with a history of endotracheal tube placement EXAM: PORTABLE CHEST 1 VIEW  COMPARISON:  07/20/2017 abdominal CT 07/20/2017 FINDINGS: Cardiomediastinal silhouette unchanged in size and contour. Interval placement of endotracheal tube, terminating suitably above the carina approximately 4 cm. Interval placement of gastric tube, terminating out of the field of view. Similar appearance of bibasilar opacities, more pronounced on the right. No pneumothorax IMPRESSION: Interval placement of endotracheal tube, terminating suitably above the carina. Interval placement of gastric tube, terminating out of the field of view. Similar appearance of right greater than left airspace disease Electronically Signed   By: Gilmer Mor D.O.   On: 07/20/2017 20:01        Scheduled Meds: . arformoterol  15 mcg Nebulization BID  . budesonide (PULMICORT) nebulizer solution  0.5 mg Nebulization BID  . folic acid  1 mg Intravenous Daily  . heparin  5,000 Units Subcutaneous Q8H  . multivitamin  15 mL Oral Daily  . neomycin-bacitracin-polymyxin   Topical Once  . pantoprazole (PROTONIX) IV  40 mg Intravenous Q12H  . thiamine injection  100 mg Intravenous Daily   Continuous Infusions: . sodium chloride    . dextrose 5 % and 0.45 % NaCl with KCl 20 mEq/L 75 mL/hr at 07/22/17 0829  . ertapenem 1 g (07/22/17 1407)     LOS: 14 days    Time spent: 25 minutes    Skilynn Durney Salli Quarry, MD [facility] Pager [phone]  If 7PM-7AM, please contact night-coverage [email or web address] Password [facility] 07/22/2017, 3:18 PM

## 2017-07-22 NOTE — Progress Notes (Signed)
Nutrition Follow-up  DOCUMENTATION CODES:   Non-severe (moderate) malnutrition in context of chronic illness  INTERVENTION:  - Once TF able to be re-started, recommend Osmolite 1.5 @ 10 mL/hr and advance by 10 mL every 24 hours to reach goal rate of Osmolite 1.5 @ 60 mL/hr.  At goal rate, this TF regimen will provide 2160 kcal (103% upper end of estimated kcal need), 90 grams of protein (100% estimated protein needs), and 1097 mL free water.   Patient at risk of refeeding syndrome. Once TF begins, please monitor magnesium, potassium, and phosphorus daily for at least 3 days. MD to replete as needed.  NUTRITION DIAGNOSIS:   Moderate Malnutrition related to chronic illness, nausea, vomiting(ETOH abuse) as evidenced by percent weight loss, energy intake < or equal to 75% for > or equal to 1 month, moderate fat depletion, moderate muscle depletion.  Ongoing  GOAL:   Patient will meet greater than or equal to 90% of their needs  Not met at this time  MONITOR:   TF tolerance, Diet advancement, I & O's  REASON FOR ASSESSMENT:   Consult Enteral/tube feeding initiation and management  ASSESSMENT:   72 y.o. male with medical history significant of COPD, CRF on 4 liters he thinks at home, htn comes in with over a week of nausea and vomiting with no abdominal pain or blood in vomit.  No fevers.  No diarrhea. No cough.  No chest pain.  He says he gets nauseated even after drinking liquids.  Pt referred for admission for report of wt loss and his n/v.  Events: 1/13: Pt reports poor intakes for 10 days PTA. Was having N/V. 1/14: Esophageal stricture noted 1/16: EGD reveals malignant esophageal mass 1/17: biopsies confirm adenocarcinoma per GI note 1/18: J-tube placed 1/19: TF initiated, Osmolite 1.5 @ 20 ml/hr 1/20: TF stopped after episodes of N/V, Osmolite 1.5 restarted at 30 ml/hr, then stopped again. 1/23: ex lap and revision of J-tube d/t CT showing kink in small bowel causing  SBO 1/24: extubated in AM  Pt remains NPO and on Bi-PAP after extubation on 07/21/17. Pt resting at time fo RD visit with no family in the room. Per Surgery PA note, plan to restart trickle TF and ice chips/sips when pt more stable; concern for possible reintubation. Spoke with RN who states that pt is "doing better" and possibility of restarting TF will be reevaluated tomorrow. RD will continue to monitor and leave TF recommendations.  Pt is +9.1 kg since admission. Suspect weight gain is fluid-related.  I/O's: +13.5 L  Medications reviewed and include: 1 IV mg folic acid, liquid MVI, 40 mg IV Protonix BID, 100 mg IV thiamine  Drips: 5% dextrose and 0.45% NaCl with KCl 20 mEq/L  Labs reviewed: calcium 8.6 (L) CBG's: 61 this AM  Diet Order:  Diet NPO time specified  EDUCATION NEEDS:   Not appropriate for education at this time  Skin:  Skin Assessment: Skin Integrity Issues: Skin Integrity Issues:: Incisions Incisions: abdominal (1/23)  Last BM:  07/19/17  Height:   Ht Readings from Last 1 Encounters:  07/20/17 5' 7.25" (1.708 m)    Weight:   Wt Readings from Last 1 Encounters:  07/22/17 139 lb 15.9 oz (63.5 kg)    Ideal Body Weight:  67.3 kg  BMI:  Body mass index is 21.76 kg/m.  Estimated Nutritional Needs:   Kcal:  1900-2100  Protein:  90-100g  Fluid:  2L/day   Gaynell Face, MS, RD, LDN Pager: [phone]  Weekend/After Hours: [phone]

## 2017-07-22 NOTE — Progress Notes (Signed)
Per family request, holding CPT for patient to rest.

## 2017-07-22 NOTE — Progress Notes (Signed)
PULMONARY / CRITICAL CARE MEDICINE   Name: George Stokes MRN: [id number] DOB: 04-Apr-1946    ADMISSION DATE:  07/07/2017 CONSULTATION DATE:  07/20/2016  REFERRING MD:  Dr. Johna Sheriff  CHIEF COMPLAINT:  Post op vent dependence  HISTORY OF PRESENT ILLNESS:   72 year old male with PMH of HTN, COPD, Chronic hypoxia on home O2, and Etoh abuse, admitted to [facility] on 1/11 with complaints of nausea and vomiting since 12/23 failing outpatient treatment. At that time, also found to have AKI and CAP. Workup that admission included EGD, which demonstrated malignant esophageal stenosis. Biopsy revealed adenocarcinoma. J tube placed 1/18. Despite this, he continued to have nausea and vomiting, with worsening abdominal distension and found to have ascites leaking from around the J-tube. CT on 1/23 demonstrated high grade proximal small bowel obstruction. He was taken to the OR this evening for Ex-lap and revision of jejunostomy. Post operatively he remained on the vent and was transferred to the ICU.    SUBJECTIVE:  Called to bedside to assess for lethargy, desaturations.  Pt's O2 escalated overnight to NRB / HFNC, given ativan for agitation.  Received 1mg  ativan overnight.   VITAL SIGNS: BP (!) 151/51   Pulse (!) 112   Temp 98 F (36.7 C) (Oral)   Resp 18   Ht 5' 7.25" (1.708 m)   Wt 139 lb 15.9 oz (63.5 kg)   SpO2 (!) 81% Comment: Np Verlean Allport in rm  BMI 21.76 kg/m   VENTILATOR SETTINGS: FiO2 (%):  [100 %] 100 %  INTAKE / OUTPUT: I/O last 3 completed shifts: In: 5207.5 [I.V.:2425; NG/GT:765.8; IV Piggyback:2016.7] Out: 1900 [Urine:1500; Drains:400]  PHYSICAL EXAMINATION: General: thin adult male lying in bed HEENT: MM pink/dry, no jvd, temporal wasting  Neuro: lethargic, arouses to stimulation / appropriate, MAE  CV: s1s2 rrr, no m/r/g PULM: even/non-labored, clear on L, diminished R lower  GI: soft, midline abdominal dressing c/d/i, RLQ red rubber catheter with serosanguinous drainage  around J tube site  Extremities: warm/dry, no edema  Skin: no rashes or lesions   LABS:  BMET Recent Labs  Lab 07/20/17 1956 07/21/17 0318 07/22/17 0316  NA 139 139 141  K 3.3* 4.3 3.9  CL 98* 101 100*  CO2 30 26 30   BUN 19 21* 19  CREATININE 1.30* 1.22 0.97  GLUCOSE 92 94 65   Electrolytes Recent Labs  Lab 07/20/17 1956 07/21/17 0318 07/22/17 0316  CALCIUM 8.5* 8.2* 8.6*  MG 1.3*  --   --   PHOS 3.2  --   --    CBC Recent Labs  Lab 07/20/17 1956 07/21/17 0318 07/22/17 0316  WBC 21.6* 23.8* 23.8*  HGB 13.4 12.2* 12.8*  HCT 40.7 36.7* 39.2  PLT 175 162 217   Coag's No results for input(s): APTT, INR in the last 168 hours.  Sepsis Markers Recent Labs  Lab 07/20/17 1956 07/21/17 0318 07/22/17 0316  LATICACIDVEN 2.3* 2.0*  --   PROCALCITON 0.43 0.36 0.22    ABG Recent Labs  Lab 07/20/17 1938 07/22/17 0819  PHART 7.437 7.257*  PCO2ART 44.4 76.3*  PO2ART 238* 70.1*    Liver Enzymes Recent Labs  Lab 07/20/17 1956  AST 27  ALT 15*  ALKPHOS 60  BILITOT 0.5  ALBUMIN 2.5*    Cardiac Enzymes No results for input(s): TROPONINI, PROBNP in the last 168 hours.  Glucose Recent Labs  Lab 07/20/17 1600 07/20/17 1932 07/20/17 2315 07/21/17 0310 07/21/17 0751 07/22/17 0749  GLUCAP 92 93 93 88  76 61*   Imaging Dg Chest Port 1 View  Result Date: 07/22/2017 CLINICAL DATA:  Status postextubation.  Airspace consolidation. EXAM: PORTABLE CHEST 1 VIEW COMPARISON:  July 21, 2017 FINDINGS: Endotracheal tube and nasogastric tube have been removed. No pneumothorax. There is airspace consolidation throughout the right mid lower lung zones, increased from 1 day prior. There is a small right pleural effusion. The left lung is somewhat hyperexpanded. There is no edema or consolidation on the left. Heart size and pulmonary vascular normal. No adenopathy appreciable. Old healed fractures noted on the left. IMPRESSION: No pneumothorax. Increase in airspace  consolidation in the right mid and lower lung zones with small right pleural effusion. Question progression of pneumonia versus aspiration. Both entities may be present concurrently. Left lung hyperexpanded but clear. Stable cardiac silhouette. Electronically Signed   By: Bretta Bang III M.D.   On: 07/22/2017 07:32   STUDIES:  CT chest/abdomen 1/11 > Patchy areas of nodular airspace density primarily in the right middle lobe and right upper lobe. Bilateral renal hypodense lesions, incompletely characterized. Ultrasound may provide better evaluation. No hydronephrosis or obstructing stone. 1/12 US renal > Left renal cysts. 1/16 EGD >  Malignant appearing esophageal stenosis,  1/23 CT abd > High-grade proximal small bowel obstruction with transition at the jejunostomy tube. There is a degree of closed loop physiology given the GE junction mass and severe stenosis. Dilated esophagus above the GE junction mass. There is shifting opacity at the right base concerning for recurrent aspiration. Small pleural effusions.Perfusion anomaly in the ventral right lobe liver without visible underlying cause. Attention on future staging scans.  CULTURES: 1/23 blood >> 1/23 tracheal aspirate >>   ANTIBIOTICS: Azithro 1/11 > 1/14 CTX 1/11 > 1/14 Unasyn 1/14 > 1/18 Cefazolin 1/18 >1/19 Augmentin 1/18 > 1/22 Ertapenem 1/23 >>  SIGNIFICANT EVENTS: 1/11  admit for nausea and vomiting 1/16  EGD demonstrated malignant esophageal stricture.  1/18  J tube placed 1/23  bowel obstruction, to OR for ex-lap  LINES/TUBES: J tube 1/18 > 1/23 >> ETT 1/23 >> Foley catheter 1/23 >> OG tube 1/23 >>  DISCUSSION: 71yoM with HTN, COPD, Chronic hypoxia on home O2, and Etoh abuse, admitted to [facility] on 1/11 with N/V, AKI, and CAP, and found to have esophageal adenocarcinoma. J tube placed 1/18, complicated by continued N/V, worsening abdominal distension, and ascites leaking from around the J-tube. CT on 1/23 demonstrated  high grade proximal small bowel obstruction. He was taken to the OR this evening for Ex-lap and revision of jejunostomy. Post operatively he remained on the vent and was transferred to the ICU.  Extubated 1/24 am.    ASSESSMENT / PLAN:  PULMONARY A: Acute-on-Chronic Hypoxic Respiratory failure COPD Aspiration pneumonia P: Pulmonary hygiene - IS, chest percussion via bed, upright positioning  Brovana + pulmicort  Duoneb Q6 O2 to support sats 88-92% Follow cultures Trial BiPAP now  CARDIOVASCULAR A: Hx HTN P: Hold home norvasc  PRN lopressor for HR PRN hydralazine for BP   RENAL A: AKI Hypokalemia P: D51/2NS with 20KCL at 75 Trend BMP / urinary output Replace electrolytes as indicated Avoid nephrotoxic agents, ensure adequate renal perfusion  GASTROINTESTINAL A: SBO Nausea / Vomiting  J-tube S/P Ex-Lap with J-Tube Revision  P: NPO Diet, post-op care per [facility] PPI  HEMATOLOGIC A: Anemia P: Trend CBC Heparin + SCD's for DVT prophylaxis  INFECTIOUS A: Severe sepsis P: Continue ertapenem  Monitor fever curve / WBC trend  Trend PCT  Monitor abd drainage  ENDOCRINE A: At Risk for Hyper / Hypoglycemia P: SSI  Dextrose in IVF as above  NEUROLOGIC A: Hx Etoh Abuse P: Monitor for ETOH withdrawal  Thiamine / folate  CIWA protocol    FAMILY  - Updates: Sister updated at bedside.  She would be surrogate HCPOA > he is not married / no children.   - Inter-disciplinary family meet or Palliative Care meeting due by: 07/27/17    Canary Brim, NP-C [facility] Pgr: [phone] or if no answer [phone] 07/22/2017, 9:02 AM

## 2017-07-22 NOTE — Progress Notes (Signed)
[facility] Progress Note  2 Days Post-Op  Subjective: CC: oxygen desaturation Patient with NRB mask on this morning and labored breathing. Seems confused. Got ativan once overnight around 0400 for agitation and restlessness, NRB mask placed later in the morning. Patient unable   Objective: Vital signs in last 24 hours: Temp:  [97.4 F (36.3 C)-98.6 F (37 C)] 98 F (36.7 C) (01/25 0256) Resp:  [12-21] 21 (01/25 0400) BP: (93-176)/(54-89) 154/79 (01/25 0400) SpO2:  [87 %-98 %] 98 % (01/25 0400) Weight:  [63.5 kg (139 lb 15.9 oz)] 63.5 kg (139 lb 15.9 oz) (01/25 0500) Last BM Date: 07/19/17  Intake/Output from previous day: 01/24 0701 - 01/25 0700 In: 2023.4 [I.V.:1207.5; NG/GT:765.8; IV Piggyback:50] Out: 400 [Urine:400] Intake/Output this shift: No intake/output data recorded.  PE: Gen:  Confused, appears exhausted  Card:  tachycardic, pedal pulses 2+ BL Pulm:  Labored breathing, NRB mask with sats in the 90s, rhonchi bilaterally Abd: Soft, non-tender, non-distended, bowel sounds hypoactive, no HSM, incision C/D/I Skin: warm and dry, no rashes   Lab Results:  Recent Labs    07/21/17 0318 07/22/17 0316  WBC 23.8* 23.8*  HGB 12.2* 12.8*  HCT 36.7* 39.2  PLT 162 217   BMET Recent Labs    07/21/17 0318 07/22/17 0316  NA 139 141  K 4.3 3.9  CL 101 100*  CO2 26 30  GLUCOSE 94 65  BUN 21* 19  CREATININE 1.22 0.97  CALCIUM 8.2* 8.6*   PT/INR No results for input(s): LABPROT, INR in the last 72 hours. CMP     Component Value Date/Time   NA 141 07/22/2017 0316   NA 143 06/29/2017 1508   K 3.9 07/22/2017 0316   CL 100 (L) 07/22/2017 0316   CO2 30 07/22/2017 0316   GLUCOSE 65 07/22/2017 0316   BUN 19 07/22/2017 0316   BUN 28 (H) 06/29/2017 1508   CREATININE 0.97 07/22/2017 0316   CREATININE 1.07 05/01/2015 1508   CALCIUM 8.6 (L) 07/22/2017 0316   PROT 5.3 (L) 07/20/2017 1956   PROT 7.3 06/29/2017 1508   ALBUMIN 2.5 (L) 07/20/2017 1956    ALBUMIN 4.5 06/29/2017 1508   AST 27 07/20/2017 1956   ALT 15 (L) 07/20/2017 1956   ALKPHOS 60 07/20/2017 1956   BILITOT 0.5 07/20/2017 1956   BILITOT 0.6 06/29/2017 1508   GFRNONAA >60 07/22/2017 0316   GFRNONAA 71 05/01/2015 1508   GFRAA >60 07/22/2017 0316   GFRAA 83 05/01/2015 1508   Lipase     Component Value Date/Time   LIPASE 39 07/12/2017 0342       Studies/Results: Dg Chest 1 View  Result Date: 07/20/2017 CLINICAL DATA:  Shortness of breath, weakness. History of esophageal malignancy, COPD on home oxygen, former smoker. EXAM: CHEST 1 VIEW COMPARISON:  Chest x-ray of July 19, 2017 FINDINGS: There is persistent infiltrate at the right lung base. There no large pleural effusion. The left lung is clear. The heart and pulmonary vascularity are normal. There is mild tortuosity of the ascending thoracic aorta. The bony thorax is unremarkable. IMPRESSION: COPD. Right basilar pneumonia little changed from yesterday's study. Electronically Signed   By: David  Martinique M.D.   On: 07/20/2017 15:12   Ct Abdomen Pelvis W Contrast  Result Date: 07/20/2017 CLINICAL DATA:  Recently diagnosed esophageal cancer. Abdominal distention. Recent jejunostomy (5 days postop) tube with fluid leak concerning for ascites EXAM: CT ABDOMEN AND PELVIS WITH CONTRAST TECHNIQUE: Multidetector CT imaging of the abdomen and pelvis  was performed using the standard protocol following bolus administration of intravenous contrast. CONTRAST:  100 cc Isovue-300 intravenous COMPARISON:  07/08/2017 FINDINGS: Lower chest: Airspace opacity in the right lower lobe. Improved airspace disease in the right middle lobe. Suspect aspiration given the chronically distended esophagus above the GE junction mass. Small bilateral pleural effusion. Hepatobiliary: Geographic low-density in segment 5 and 8 of the liver without visible underlying lesions seen. Negative gallbladder. Pancreas: Unremarkable. Spleen: Unremarkable.  Adrenals/Urinary Tract: Negative adrenals. No hydronephrosis or stone. Moderate scarring involving the upper pole left renal cortex. Bilateral simple appearing renal cysts. Unremarkable bladder. Stomach/Bowel: Recent jejunostomy in unremarkable position. The bowel is kinked at the level of the enterotomy and there is marked proximal small bowel and stomach distension. Small volume pneumoperitoneum which is expected after recent surgery. Distended lower esophagus above a GE junction mass. Colonic diverticulosis. Vascular/Lymphatic: Atherosclerosis with advanced atheromatous wall thickening of the aorta. No definite adenopathy. PET-CT would be more sensitive in this setting. Reproductive:No pathologic findings. Other: No ascites or pneumoperitoneum. Musculoskeletal: No acute abnormalities. A call has been placed to the ordering provider. IMPRESSION: 1. High-grade proximal small bowel obstruction with transition at the jejunostomy tube. There is a degree of closed loop physiology given the GE junction mass and severe stenosis. 2. Dilated esophagus above the GE junction mass. There is shifting opacity at the right base concerning for recurrent aspiration. 3. Small pleural effusions. 4. Perfusion anomaly in the ventral right lobe liver without visible underlying cause. Attention on future staging scans. Electronically Signed   By: Monte Fantasia M.D.   On: 07/20/2017 15:07   Dg Chest Port 1 View  Result Date: 07/22/2017 CLINICAL DATA:  Status postextubation.  Airspace consolidation. EXAM: PORTABLE CHEST 1 VIEW COMPARISON:  July 21, 2017 FINDINGS: Endotracheal tube and nasogastric tube have been removed. No pneumothorax. There is airspace consolidation throughout the right mid lower lung zones, increased from 1 day prior. There is a small right pleural effusion. The left lung is somewhat hyperexpanded. There is no edema or consolidation on the left. Heart size and pulmonary vascular normal. No adenopathy  appreciable. Old healed fractures noted on the left. IMPRESSION: No pneumothorax. Increase in airspace consolidation in the right mid and lower lung zones with small right pleural effusion. Question progression of pneumonia versus aspiration. Both entities may be present concurrently. Left lung hyperexpanded but clear. Stable cardiac silhouette. Electronically Signed   By: Lowella Grip III M.D.   On: 07/22/2017 07:32   Portable Chest Xray  Result Date: 07/21/2017 CLINICAL DATA:  Endotracheal tube positioning EXAM: PORTABLE CHEST 1 VIEW COMPARISON:  Yesterday FINDINGS: Endotracheal tube tip between the clavicular heads and carina. An orogastric tube reaches the stomach. Lung opacity at the right more than left base. Small pleural effusions by abdominal CT yesterday. Large lung volumes compatible with COPD. IMPRESSION: 1. Stable positioning of endotracheal and orogastric tubes. 2. Small pleural effusions and right more than left lower lobe opacity. Clinical circumstances primarily concerning for aspiration pneumonitis/pneumonia. Electronically Signed   By: Monte Fantasia M.D.   On: 07/21/2017 07:07   Portable Chest X-ray  Result Date: 07/20/2017 CLINICAL DATA:  72 year old male with a history of endotracheal tube placement EXAM: PORTABLE CHEST 1 VIEW COMPARISON:  07/20/2017 abdominal CT 07/20/2017 FINDINGS: Cardiomediastinal silhouette unchanged in size and contour. Interval placement of endotracheal tube, terminating suitably above the carina approximately 4 cm. Interval placement of gastric tube, terminating out of the field of view. Similar appearance of bibasilar opacities, more pronounced on  the right. No pneumothorax IMPRESSION: Interval placement of endotracheal tube, terminating suitably above the carina. Interval placement of gastric tube, terminating out of the field of view. Similar appearance of right greater than left airspace disease Electronically Signed   By: Corrie Mckusick D.O.   On:  07/20/2017 20:01    Anti-infectives: Anti-infectives (From admission, onward)   Start     Dose/Rate Route Frequency Ordered Stop   07/20/17 1400  ertapenem (INVANZ) 1 g in sodium chloride 0.9 % 50 mL IVPB     1 g 100 mL/hr over 30 Minutes Intravenous Every 24 hours 07/20/17 1209     07/16/17 0000  ceFAZolin (ANCEF) IVPB 2g/100 mL premix     2 g 200 mL/hr over 30 Minutes Intravenous Every 8 hours 07/15/17 1833 07/16/17 0054   07/15/17 0945  amoxicillin-clavulanate (AUGMENTIN) 250-62.5 MG/5ML suspension 500 mg     500 mg Oral Every 8 hours 07/15/17 0938 07/19/17 0928   07/15/17 0600  ceFAZolin (ANCEF) IVPB 2g/100 mL premix     2 g 200 mL/hr over 30 Minutes Intravenous On call to O.R. 07/14/17 1809 07/15/17 1632   07/11/17 1600  ampicillin-sulbactam (UNASYN) 1.5 g in sodium chloride 0.9 % 50 mL IVPB  Status:  Discontinued     1.5 g 100 mL/hr over 30 Minutes Intravenous Every 6 hours 07/11/17 1329 07/15/17 0940   07/08/17 1000  cefTRIAXone (ROCEPHIN) 1 g in dextrose 5 % 50 mL IVPB  Status:  Discontinued     1 g 100 mL/hr over 30 Minutes Intravenous Every 24 hours 07/08/17 0954 07/11/17 1315   07/08/17 1000  azithromycin (ZITHROMAX) 500 mg in dextrose 5 % 250 mL IVPB  Status:  Discontinued     500 mg 250 mL/hr over 60 Minutes Intravenous Every 24 hours 07/08/17 0954 07/11/17 1315       Assessment/Plan COPD oxygen dependent Alcohol abuse Malnutrition  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 Dalbert Batman 1/23 CT showed kink in small bowel at the level of jejunostomy causing SBO S/p ex-lap with revision of jejunostomy 1/23 Dr. Excell Seltzer  -POD#2 - will restart trickle TF and ice chips/sips when patient more stable  Patient extubated yesterday and was seen this AM on NRB mask and confused. Have discussed with pulmonology critical care, who are at the bedside. Patient hypercarbic but improving some with suctioning and deep breathing.   CXR this AM with increased consolidation on the right and possible right pleural effusion. Concerned we may be headed for reintubation  ID -ertapenem 1/23>> FEN -NPO, IVF VTE -SCDs, SQ heparin  Foley -discontinued 1/24 Follow up -Dr. Dalbert Batman    LOS: 14 days    Brigid Re , [facility] 07/22/2017, 7:59 AM Pager: [phone] Consults: [phone] Mon-Fri 7:00 am-4:30 pm Sat-Sun 7:00 am-11:30 am

## 2017-07-22 NOTE — Progress Notes (Signed)
PT Cancellation Note  Patient Details Name: Remy Voiles MRN: [id number] DOB: 05-08-1946   Cancelled Treatment:     unable to participate due to confusion and labored breathing.  Will check back another day as schedule permits.   Rica Koyanagi  PTA [facility]  Acute  Rehab Pager      [phone]

## 2017-07-23 ENCOUNTER — Inpatient Hospital Stay (HOSPITAL_COMMUNITY): Payer: Medicare Other

## 2017-07-23 ENCOUNTER — Inpatient Hospital Stay (HOSPITAL_COMMUNITY): Payer: Medicare Other | Admitting: Registered Nurse

## 2017-07-23 DIAGNOSIS — T17908A Unspecified foreign body in respiratory tract, part unspecified causing other injury, initial encounter: Secondary | ICD-10-CM

## 2017-07-23 DIAGNOSIS — J9811 Atelectasis: Secondary | ICD-10-CM

## 2017-07-23 LAB — CBC
HCT: 42.5 % (ref 39.0–52.0)
Hemoglobin: 14.1 g/dL (ref 13.0–17.0)
MCH: 30.3 pg (ref 26.0–34.0)
MCHC: 33.2 g/dL (ref 30.0–36.0)
MCV: 91.2 fL (ref 78.0–100.0)
PLATELETS: 226 10*3/uL (ref 150–400)
RBC: 4.66 MIL/uL (ref 4.22–5.81)
RDW: 16.2 % — ABNORMAL HIGH (ref 11.5–15.5)
WBC: 20.1 10*3/uL — ABNORMAL HIGH (ref 4.0–10.5)

## 2017-07-23 LAB — BLOOD GAS, ARTERIAL
ACID-BASE EXCESS: 4 mmol/L — AB (ref 0.0–2.0)
ACID-BASE EXCESS: 4.6 mmol/L — AB (ref 0.0–2.0)
Bicarbonate: 35.6 mmol/L — ABNORMAL HIGH (ref 20.0–28.0)
Bicarbonate: 30.2 mmol/L — ABNORMAL HIGH (ref 20.0–28.0)
DELIVERY SYSTEMS: POSITIVE
DRAWN BY: 308601
Drawn by: 308601
EXPIRATORY PAP: 10
FIO2: 100
FIO2: 100
INSPIRATORY PAP: 15
MECHVT: 530 mL
Mode: POSITIVE
O2 SAT: 89.1 %
O2 Saturation: 83.3 %
PATIENT TEMPERATURE: 37
PCO2 ART: 51.1 mmHg — AB (ref 32.0–48.0)
PEEP: 5 cmH2O
PH ART: 7.206 — AB (ref 7.350–7.450)
PH ART: 7.389 (ref 7.350–7.450)
PO2 ART: 61.6 mmHg — AB (ref 83.0–108.0)
Patient temperature: 98.3
RATE: 18 resp/min
pCO2 arterial: 93.4 mmHg (ref 32.0–48.0)
pO2, Arterial: 58.3 mmHg — ABNORMAL LOW (ref 83.0–108.0)

## 2017-07-23 LAB — CULTURE, RESPIRATORY W GRAM STAIN

## 2017-07-23 LAB — BASIC METABOLIC PANEL
ANION GAP: 9 (ref 5–15)
BUN: 13 mg/dL (ref 6–20)
CHLORIDE: 103 mmol/L (ref 101–111)
CO2: 30 mmol/L (ref 22–32)
Calcium: 8.5 mg/dL — ABNORMAL LOW (ref 8.9–10.3)
Creatinine, Ser: 0.77 mg/dL (ref 0.61–1.24)
GFR calc non Af Amer: >60 mL/min (ref >60)
Glucose, Bld: 91 mg/dL (ref 65–99)
POTASSIUM: 4 mmol/L (ref 3.5–5.1)
SODIUM: 142 mmol/L (ref 135–145)

## 2017-07-23 LAB — PHOSPHORUS: PHOSPHORUS: 1.9 mg/dL — AB (ref 2.5–4.6)

## 2017-07-23 LAB — CULTURE, RESPIRATORY

## 2017-07-23 LAB — MAGNESIUM: MAGNESIUM: 1.4 mg/dL — AB (ref 1.7–2.4)

## 2017-07-23 MED ORDER — PHENYLEPHRINE HCL-NACL 10-0.9 MG/250ML-% IV SOLN
INTRAVENOUS | Status: AC
  Filled 2017-07-23: qty 250

## 2017-07-23 MED ORDER — CHLORHEXIDINE GLUCONATE 0.12% ORAL RINSE (MEDLINE KIT)
15.0000 mL | Freq: Two times a day (BID) | OROMUCOSAL | Status: DC
  Administered 2017-07-23 – 2017-07-31 (×14): 15 mL via OROMUCOSAL

## 2017-07-23 MED ORDER — MIDAZOLAM HCL 2 MG/2ML IJ SOLN
1.0000 mg | INTRAMUSCULAR | Status: DC | PRN
  Administered 2017-07-23: 1 mg via INTRAVENOUS
  Filled 2017-07-23 (×5): qty 2

## 2017-07-23 MED ORDER — FENTANYL CITRATE (PF) 100 MCG/2ML IJ SOLN
50.0000 ug | INTRAMUSCULAR | Status: DC | PRN
  Administered 2017-07-23 (×2): 50 ug via INTRAVENOUS
  Filled 2017-07-23 (×3): qty 2

## 2017-07-23 MED ORDER — SUCCINYLCHOLINE CHLORIDE 20 MG/ML IJ SOLN
INTRAMUSCULAR | Status: DC | PRN
  Administered 2017-07-23: 80 mg via INTRAVENOUS

## 2017-07-23 MED ORDER — MIDAZOLAM HCL 2 MG/2ML IJ SOLN
1.0000 mg | INTRAMUSCULAR | Status: DC | PRN
  Administered 2017-07-23 – 2017-07-24 (×9): 1 mg via INTRAVENOUS
  Filled 2017-07-23 (×5): qty 2

## 2017-07-23 MED ORDER — PHENYLEPHRINE HCL-NACL 10-0.9 MG/250ML-% IV SOLN
0.0000 ug/min | INTRAVENOUS | Status: DC
  Administered 2017-07-23: 20 ug/min via INTRAVENOUS
  Administered 2017-07-23: 25 ug/min via INTRAVENOUS
  Administered 2017-07-23: 40 ug/min via INTRAVENOUS
  Administered 2017-07-24: 25 ug/min via INTRAVENOUS
  Filled 2017-07-23 (×4): qty 250

## 2017-07-23 MED ORDER — ORAL CARE MOUTH RINSE
15.0000 mL | Freq: Four times a day (QID) | OROMUCOSAL | Status: DC
  Administered 2017-07-23 – 2017-07-30 (×26): 15 mL via OROMUCOSAL

## 2017-07-23 MED ORDER — PHENYLEPHRINE HCL 10 MG/ML IJ SOLN
INTRAVENOUS | Status: DC | PRN
  Administered 2017-07-23: 50 ug/min via INTRAVENOUS

## 2017-07-23 MED ORDER — POTASSIUM PHOSPHATES 15 MMOLE/5ML IV SOLN
20.0000 mmol | Freq: Once | INTRAVENOUS | Status: AC
  Administered 2017-07-23: 20 mmol via INTRAVENOUS
  Filled 2017-07-23: qty 6.67

## 2017-07-23 MED ORDER — PROPOFOL 1000 MG/100ML IV EMUL
INTRAVENOUS | Status: AC
  Filled 2017-07-23: qty 100

## 2017-07-23 MED ORDER — PROPOFOL 10 MG/ML IV BOLUS
INTRAVENOUS | Status: DC | PRN
  Administered 2017-07-23: 100 mg via INTRAVENOUS

## 2017-07-23 MED ORDER — SODIUM CHLORIDE 0.9 % IV BOLUS (SEPSIS)
1000.0000 mL | Freq: Once | INTRAVENOUS | Status: AC
  Administered 2017-07-23: 1000 mL via INTRAVENOUS

## 2017-07-23 MED ORDER — ACETYLCYSTEINE 20 % IN SOLN
RESPIRATORY_TRACT | Status: AC
  Filled 2017-07-23: qty 4

## 2017-07-23 MED ORDER — MAGNESIUM SULFATE 4 GM/100ML IV SOLN
4.0000 g | Freq: Once | INTRAVENOUS | Status: AC
  Administered 2017-07-23: 4 g via INTRAVENOUS
  Filled 2017-07-23: qty 100

## 2017-07-23 MED ORDER — FENTANYL CITRATE (PF) 100 MCG/2ML IJ SOLN
50.0000 ug | INTRAMUSCULAR | Status: DC | PRN
  Administered 2017-07-23 – 2017-07-24 (×6): 50 ug via INTRAVENOUS
  Filled 2017-07-23 (×5): qty 2

## 2017-07-23 MED ORDER — OSMOLITE 1.5 CAL PO LIQD
1000.0000 mL | ORAL | Status: DC
  Administered 2017-07-23: 1000 mL
  Filled 2017-07-23: qty 1000

## 2017-07-23 MED ORDER — ACETYLCYSTEINE 20 % IN SOLN
4.0000 mL | Freq: Three times a day (TID) | RESPIRATORY_TRACT | Status: AC
  Administered 2017-07-23 (×2): 4 mL via RESPIRATORY_TRACT
  Filled 2017-07-23 (×3): qty 4

## 2017-07-23 MED ORDER — ACETYLCYSTEINE 20 % IN SOLN: 4.0000 mL | Freq: Once | RESPIRATORY_TRACT | Status: DC

## 2017-07-23 MED ORDER — SODIUM CHLORIDE 0.9 % IV SOLN
INTRAVENOUS | Status: DC | PRN
  Administered 2017-07-23: 03:00:00 via INTRAVENOUS

## 2017-07-23 NOTE — Progress Notes (Signed)
[facility]Link Physician-Brief Progress Note Patient Name: George Stokes DOB: 1946-02-07 MRN: [id number]   Date of Service  07/23/2017  HPI/Events of Note  Patient with progressive change in mental status.  Had been on BiPAP but was on [place] earlier.  ABG shows Hypercarbic and hypoxic resp failure., 7.21/93/61/36.  Sats on BiPAP 85%  eICU Interventions  Plan: Anesthesia to intubate PRN sedation/pain meds in place Vent orders F/U on PCXR/ABG     Intervention Category Major Interventions: Respiratory failure - evaluation and management  DETERDING,ELIZABETH 07/23/2017, 2:48 AM

## 2017-07-23 NOTE — Progress Notes (Signed)
Patient ID: George Stokes, male   DOB: 01/26/1946, 72 y.o.   MRN: [id number] 3 Days Post-Op   Subjective: Patient was reintubated overnight for respiratory distress.  He is however very alert on the ventilator.  Orogastric tube was inserted with 2 L return.  He denies any abdominal pain.  Asking for something to drink.  Objective: Vital signs in last 24 hours: Temp:  [97.6 F (36.4 C)-98.3 F (36.8 C)] 98.3 F (36.8 C) (01/25 2359) Pulse Rate:  [90-112] 90 (01/26 0300) Resp:  [17-29] 20 (01/26 0700) BP: (85-197)/(54-102) 109/71 (01/26 0700) SpO2:  [81 %-100 %] 95 % (01/26 0734) FiO2 (%):  [60 %-100 %] 100 % (01/26 0734) Weight:  [64.5 kg (142 lb 3.2 oz)] 64.5 kg (142 lb 3.2 oz) (01/26 0500) Last BM Date: 07/19/17  Intake/Output from previous day: 01/25 0701 - 01/26 0700 In: 2613.8 [I.V.:2563.8; IV Piggyback:50] Out: 2325 [Urine:325; Emesis/NG output:2000] Intake/Output this shift: No intake/output data recorded.  General appearance: alert, cooperative and Very responsive and communicative on vent GI: Nondistended.  No tenderness.  J-tube dressing intact from yesterday without excessive drainage.  Midline dressing clean and dry.  Lab Results:  Recent Labs    07/22/17 0316 07/23/17 0546  WBC 23.8* 20.1*  HGB 12.8* 14.1  HCT 39.2 42.5  PLT 217 226   BMET Recent Labs    07/22/17 0316 07/23/17 0546  NA 141 142  K 3.9 4.0  CL 100* 103  CO2 30 30  GLUCOSE 65 91  BUN 19 13  CREATININE 0.97 0.77  CALCIUM 8.6* 8.5*     Studies/Results: Dg Chest Port 1 View  Result Date: 07/23/2017 CLINICAL DATA:  Post intubation and enteric tube placement EXAM: PORTABLE CHEST 1 VIEW COMPARISON:  07/22/2017 FINDINGS: Endotracheal tube placed with tip measuring 3 cm above the carina. Enteric tube tip is coiled in the left upper quadrant consistent with location in the upper stomach. Volume loss and consolidation in the right lung with hyperinflation of the left lung. Probable right pleural  effusion. Right lung changes are progressing since the previous study. No pneumothorax. IMPRESSION: Appliances appear in satisfactory position. Progression of consolidation, effusion, and volume loss in the right lung since previous study. Compensatory hyperinflation of the left lung. Electronically Signed   By: Burman Nieves M.D.   On: 07/23/2017 03:51   Dg Chest Port 1 View  Result Date: 07/22/2017 CLINICAL DATA:  Status postextubation.  Airspace consolidation. EXAM: PORTABLE CHEST 1 VIEW COMPARISON:  July 21, 2017 FINDINGS: Endotracheal tube and nasogastric tube have been removed. No pneumothorax. There is airspace consolidation throughout the right mid lower lung zones, increased from 1 day prior. There is a small right pleural effusion. The left lung is somewhat hyperexpanded. There is no edema or consolidation on the left. Heart size and pulmonary vascular normal. No adenopathy appreciable. Old healed fractures noted on the left. IMPRESSION: No pneumothorax. Increase in airspace consolidation in the right mid and lower lung zones with small right pleural effusion. Question progression of pneumonia versus aspiration. Both entities may be present concurrently. Left lung hyperexpanded but clear. Stable cardiac silhouette. Electronically Signed   By: Bretta Bang III M.D.   On: 07/22/2017 07:32    Anti-infectives: Anti-infectives (From admission, onward)   Start     Dose/Rate Route Frequency Ordered Stop   07/20/17 1400  ertapenem (INVANZ) 1 g in sodium chloride 0.9 % 50 mL IVPB     1 g 100 mL/hr over 30 Minutes Intravenous Every 24  hours 07/20/17 1209     07/16/17 0000  ceFAZolin (ANCEF) IVPB 2g/100 mL premix     2 g 200 mL/hr over 30 Minutes Intravenous Every 8 hours 07/15/17 1833 07/16/17 0054   07/15/17 0945  amoxicillin-clavulanate (AUGMENTIN) 250-62.5 MG/5ML suspension 500 mg     500 mg Oral Every 8 hours 07/15/17 0938 07/19/17 0928   07/15/17 0600  ceFAZolin (ANCEF) IVPB 2g/100  mL premix     2 g 200 mL/hr over 30 Minutes Intravenous On call to O.R. 07/14/17 1809 07/15/17 1632   07/11/17 1600  ampicillin-sulbactam (UNASYN) 1.5 g in sodium chloride 0.9 % 50 mL IVPB  Status:  Discontinued     1.5 g 100 mL/hr over 30 Minutes Intravenous Every 6 hours 07/11/17 1329 07/15/17 0940   07/08/17 1000  cefTRIAXone (ROCEPHIN) 1 g in dextrose 5 % 50 mL IVPB  Status:  Discontinued     1 g 100 mL/hr over 30 Minutes Intravenous Every 24 hours 07/08/17 0954 07/11/17 1315   07/08/17 1000  azithromycin (ZITHROMAX) 500 mg in dextrose 5 % 250 mL IVPB  Status:  Discontinued     500 mg 250 mL/hr over 60 Minutes Intravenous Every 24 hours 07/08/17 0954 07/11/17 1315      Assessment/Plan: s/p Procedure(s): EXPLORATORY LAPAROTOMY; REVISION OF JEJUNOSTOMY Esophageal cancer. Respiratory failure with atelectasis and possible pneumonia. Ventilator and ID issues being followed by medicine. His abdomen is entirely benign and he  denies pain.  Probable gastric ileus.  I strongly doubt that he would have another obstruction at his jejunostomy tube.  I will start trickle jejunostomy feeds.  Continue gastric drainage.    LOS: 15 days    Mariella Saa 07/23/2017

## 2017-07-23 NOTE — Anesthesia Procedure Notes (Signed)
Procedure Name: Intubation Date/Time: 07/23/2017 3:00 AM Performed by: Lissa Morales, CRNA Pre-anesthesia Checklist: Patient identified, Emergency Drugs available, Suction available, Patient being monitored and Timeout performed Patient Re-evaluated:Patient Re-evaluated prior to induction Oxygen Delivery Method: Ambu bag Preoxygenation: Pre-oxygenation with 100% oxygen Induction Type: Rapid sequence and IV induction Ventilation: Mask ventilation without difficulty Laryngoscope Size: Glidescope and 4 Grade View: Grade II Tube type: Oral Tube size: 7.5 mm Number of attempts: 1 Airway Equipment and Method: Video-laryngoscopy and Stylet Placement Confirmation: ETT inserted through vocal cords under direct vision,  positive ETCO2,  CO2 detector and breath sounds checked- equal and bilateral Secured at: 24 cm Tube secured with: Tape Dental Injury: Teeth and Oropharynx as per pre-operative assessment  Comments: Called to ICU to intubate patient.   DYspnic on BIPAP, agitated and pulling at mask. . HR 110, O2 sats 83-84%, BP 136/80.IV to be access before intubation.HOB up ~80 degrees. Amboo 100% to preoxygenated.  RSI with propofol 100mg  and succinycholine 80. Atraumatic intubation with glidescope  With good visualizaton, ETT 7.5 taper tube placed between cords..ETT taped 24 cm . Minimal breath sounds in right lower base, very distant breath sounds on left.O2 sat 94%, BP down after medications with good response  After bolus on phenyephrine.   BP122/80,  HR 95 O2 sat 95-96%. Placed on  ventalator FIO2  100% PEEP5, rate18 ,TV530.Dr Deterding on TV monitor before and during intubation. DR. Valma Cava called . Awaiting  Xray. OG placed and over 1200cc's gastric contents suctioned.

## 2017-07-23 NOTE — Procedures (Signed)
Bronchoscopy Procedure Note Kairen Hallinan [id number] 1946/03/19  Procedure: Bronchoscopy Indications: Obtain specimens for culture and/or other diagnostic studies and Remove secretions  Procedure Details Consent: Risks of procedure as well as the alternatives and risks of each were explained to the (patient/caregiver).  Consent for procedure obtained. Time Out: Verified patient identification, verified procedure, site/side was marked, verified correct patient position, special equipment/implants available, medications/allergies/relevent history reviewed, required imaging and test results available.  Performed  In preparation for procedure, patient was given 100% FiO2 and bronchoscope lubricated. Sedation: versed 1 fent 50  Airway entered and the following bronchi were examined: Bronchi.   Procedures performed: Thick creamy secretions from Rt airways lavaged & suctioned out .BAL performed Bronchoscope removed.  , Patient placed back on 100% FiO2 at conclusion of procedure.    Evaluation Hemodynamic Status: Transient hypotension treated with pressors and fluid; O2 sats: stable throughout Patient's Current Condition: stable Specimens:  Sent purulent fluid Complications: No apparent complications Patient did tolerate procedure well.   Leanna Sato [place] 07/23/2017

## 2017-07-23 NOTE — Progress Notes (Signed)
At 0218 bed alarm went off, immediately in to check on patient, patient sitting up on side of bed at the foot of the bed, had urinated on floor and bed.  Pt stood and pivoted to chair under own power.  Peri-care, gown changed, and bed linens changed, which took approx. 2-3 minutes.  Prior to assisting back to bed, pt pulled O2 Sat probe off, immediately placed back on, with sats 92-93%.  When attempting to assist back to bed, pt became very lethargic, very close to unresponsive, pt picked up and placed back in bed, noticed white thick vomitus on chin, O2 sat showing 72%, HFNC currently on and immediately increased flow rate, RT called to room, after a minute of HFNC, and Sat only coming up to low 80s, placed on Bipap @ 100%, after 20-25 minutes, pt responsive but still lethargic, with Sats 85%, [facility] NP notified of situation, ABG and CXR ordered, called [facility] to see if pt was still on their services, talked with [facility], [facility] advised no longer on list.  [facility] NP at bedside and in-person talked about situation, when discussing situation, Dr. Deterding camera'd in the room, and advised that he still is on their list and to call anesthesia to get patient intubated.  Janet Evans, CRNA, came up to intubate patient.  She requested Diprivan 50mg IVP (5ml)  and Succinylcholine 80mg (4ml) IVP for intubation.  Succinylcholine retrieved from the pyxis RSI kit on unit.  Diprivan retreived from pyxis override of a Diprivan 1g/100ml bottle.  Ready for intubation, in room, Janet Evans (CRNA),  ,RN (Charge), Ronda Davis (Primary RN), Mindy Hopper,RN and Jessica (RT).  Pt medicated with Diprivan 50mg and Succinylcholine 80mg per direction of Janet Evans,CRNA.  Pt was intubated with no difficulty, however patient's BP dropped after intubation, CRNA requested Neo "stick", did not have Neo "stick" on unit, I was able to get Neo infusion from Pyxis.  Janet Evans gave some Neo from the bag to the patient.  BP responded, no  further issues.  Dr. Deterding had camera on during the whole procedure and is aware of situation.  Rest of Diprivan (95ml) wasted in sharps box, along with rest of Succinylcholine (6ml),  ,RN and Ronda Davis,RN witnessed waste.   C ,RN,BSN,CCRN [place] ICU/SD Care Coordinator / Rapid Response Nurse 

## 2017-07-23 NOTE — Progress Notes (Signed)
PULMONARY / CRITICAL CARE MEDICINE   Name: George Stokes MRN: [id number] DOB: 12-Apr-1946    ADMISSION DATE:  07/07/2017 CONSULTATION DATE:  07/20/2016  REFERRING MD:  Dr. Johna Sheriff  CHIEF COMPLAINT:  Post op vent dependence  HISTORY OF PRESENT ILLNESS:   72 year old male with PMH of HTN, COPD, Chronic hypoxia on home O2, and Etoh abuse, admitted to [facility] on 1/11 with complaints of nausea and vomiting since 12/23 failing outpatient treatment. At that time, also found to have AKI and CAP. Workup that admission included EGD, which demonstrated malignant esophageal stenosis. Biopsy revealed adenocarcinoma. J tube placed 1/18. Despite this, he continued to have nausea and vomiting, with worsening abdominal distension and found to have ascites leaking from around the J-tube. CT on 1/23 demonstrated high grade proximal small bowel obstruction. He was taken to the OR this evening for Ex-lap and revision of jejunostomy. Post operatively he remained on the vent and was transferred to the ICU.   CULTURES: 1/23 blood >>ng 1/23 tracheal aspirate >>   ANTIBIOTICS: Azithro 1/11 > 1/14 CTX 1/11 > 1/14 Unasyn 1/14 > 1/18 Cefazolin 1/18 >1/19 Augmentin 1/18 > 1/22 Ertapenem 1/23 >>  SIGNIFICANT EVENTS: 1/11  admit for nausea and vomiting 1/16  EGD demonstrated malignant esophageal stricture.  1/18  J tube placed 1/23  bowel obstruction, to OR for ex-lap, j'ostomy revised 1/26 am reintubated  LINES/TUBES: J tube 1/18 > 1/23 >> ETT 1/23 >>1/24, 1/26 >> Foley catheter 1/23 >> OG tube 1/23 >>  SUBJECTIVE:   Events this am noted Intubated by CRNA Afebrile Now awake, hypotensive  VITAL SIGNS: BP 109/71   Pulse 90   Temp 98.1 F (36.7 C) (Oral)   Resp 20   Ht 5' 7.25" (1.708 m)   Wt 142 lb 3.2 oz (64.5 kg)   SpO2 95%   BMI 22.11 kg/m   VENTILATOR SETTINGS: Vent Mode: PRVC FiO2 (%):  [60 %-100 %] 100 % Set Rate:  [18 bmp] 18 bmp Vt Set:  [530 mL] 530 mL PEEP:  [5 cmH20-10 cmH20] 10  cmH20 Plateau Pressure:  [18 cmH20-20 cmH20] 20 cmH20  INTAKE / OUTPUT: I/O last 3 completed shifts: In: 2613.8 [I.V.:2563.8; IV Piggyback:50] Out: 2475 [Urine:475; Emesis/NG output:2000]  PHYSICAL EXAMINATION: General: thin adult male  Oral ETT, gesturing HEENT: MM pink/dry, no jvd, temporal wasting  Neuro:alert, interactive MAE  CV: s1s2 rrr, no m/r/g PULM: even/non-labored, decreased on L, absent R GI: soft, midline abdominal dressing c/d/i, RLQ red rubber catheter with serosanguinous drainage around J tube site  Extremities: warm/dry, no edema  Skin: no rashes or lesions   LABS:  BMET Recent Labs  Lab 07/21/17 0318 07/22/17 0316 07/23/17 0546  NA 139 141 142  K 4.3 3.9 4.0  CL 101 100* 103  CO2 26 30 30   BUN 21* 19 13  CREATININE 1.22 0.97 0.77  GLUCOSE 94 65 91   Electrolytes Recent Labs  Lab 07/20/17 1956 07/21/17 0318 07/22/17 0316 07/23/17 0546  CALCIUM 8.5* 8.2* 8.6* 8.5*  MG 1.3*  --   --  1.4*  PHOS 3.2  --   --  1.9*   CBC Recent Labs  Lab 07/21/17 0318 07/22/17 0316 07/23/17 0546  WBC 23.8* 23.8* 20.1*  HGB 12.2* 12.8* 14.1  HCT 36.7* 39.2 42.5  PLT 162 217 226   Coag's No results for input(s): APTT, INR in the last 168 hours.  Sepsis Markers Recent Labs  Lab 07/20/17 1956 07/21/17 0318 07/22/17 0316 07/22/17 1538  LATICACIDVEN 2.3* 2.0*  --  1.0  PROCALCITON 0.43 0.36 0.22  --     ABG Recent Labs  Lab 07/22/17 0819 07/23/17 0215 07/23/17 0345  PHART 7.257* 7.206* 7.389  PCO2ART 76.3* 93.4* 51.1*  PO2ART 70.1* 61.6* 58.3*    Liver Enzymes Recent Labs  Lab 07/20/17 1956  AST 27  ALT 15*  ALKPHOS 60  BILITOT 0.5  ALBUMIN 2.5*    Cardiac Enzymes No results for input(s): TROPONINI, PROBNP in the last 168 hours.  Glucose Recent Labs  Lab 07/20/17 2315 07/21/17 0310 07/21/17 0751 07/22/17 0749 07/22/17 0826 07/22/17 1131  GLUCAP 93 88 76 61* 86 80   Imaging Dg Chest Port 1 View  Result Date:  07/23/2017 CLINICAL DATA:  Post intubation and enteric tube placement EXAM: PORTABLE CHEST 1 VIEW COMPARISON:  07/22/2017 FINDINGS: Endotracheal tube placed with tip measuring 3 cm above the carina. Enteric tube tip is coiled in the left upper quadrant consistent with location in the upper stomach. Volume loss and consolidation in the right lung with hyperinflation of the left lung. Probable right pleural effusion. Right lung changes are progressing since the previous study. No pneumothorax. IMPRESSION: Appliances appear in satisfactory position. Progression of consolidation, effusion, and volume loss in the right lung since previous study. Compensatory hyperinflation of the left lung. Electronically Signed   By: Burman Nieves M.D.   On: 07/23/2017 03:51   Reviewed by me STUDIES:  CT chest/abdomen 1/11 > Patchy areas of nodular airspace density primarily in the right middle lobe and right upper lobe. Bilateral renal hypodense lesions, incompletely characterized. Ultrasound may provide better evaluation. No hydronephrosis or obstructing stone. 1/12 US renal > Left renal cysts. 1/16 EGD >  Malignant appearing esophageal stenosis,  1/23 CT abd > High-grade proximal small bowel obstruction with transition at the jejunostomy tube. There is a degree of closed loop physiology given the GE junction mass and severe stenosis. Dilated esophagus above the GE junction mass. There is shifting opacity at the right base concerning for recurrent aspiration. Small pleural effusions.Perfusion anomaly in the ventral right lobe liver without visible underlying cause. Attention on future staging scans.    DISCUSSION: 71yoM with HTN, COPD, Chronic hypoxia on home O2, and Etoh abuse, admitted to [facility] on 1/11 with N/V, AKI, and CAP, and found to have esophageal adenocarcinoma. J tube placed 1/18, complicated by continued N/V, worsening abdominal distension, and ascites leaking from around the J-tube. CT on 1/23 demonstrated  high grade proximal small bowel obstruction. Revised  jejunostomy. Extubated 1/24 am but developed  Rt atelectasis & reintubated 1/26  ASSESSMENT / PLAN:  PULMONARY A: Acute-on-Chronic Hypoxic Respiratory failure COPD Aspiration pneumonia Rt atelectasis P: Bronchoscopy today Pulmonary hygiene - IS, chest percussion via bed, upright positioning  Brovana + pulmicort  Duoneb Q6 Drop FIo2 & keep PEEP 10 for 24h  CARDIOVASCULAR A: Hx HTN Hypotensive post intubation P: NS bolus Hold home norvasc  PRN lopressor for HR PRN hydralazine for BP   RENAL A: AKI Hypokalemia Hypomag/ hypophos P: D51/2NS with 20KCL at 75 Trend BMP / urinary output Replace electrolytes as indicated Avoid nephrotoxic agents  GASTROINTESTINAL A: SBO J-tube S/P Ex-Lap with J-Tube Revision  Protein calorie malnutrition P: NPO PPI Ct OGT to LIS - high drainage Start J tube feeds at some point  HEMATOLOGIC A: Anemia P: Trend CBC Heparin + SCD's for DVT prophylaxis  INFECTIOUS A: Severe sepsis P: Continue ertapenem  Monitor fever curve / WBC trend  Trend PCT  Monitor  abd drainage   ENDOCRINE A: At Risk for Hyper / Hypoglycemia P: CBgs q 4h    NEUROLOGIC A: Hx Etoh Abuse P: Monitor for ETOH withdrawal , but too sensitive to ativan  Thiamine / folate  CIWA protocol    FAMILY  - Updates: Sister updated .  She would be surrogate HCPOA > he is not married / no children.   - Inter-disciplinary family meet or Palliative Care meeting due by: 07/27/17  The patient is critically ill with multiple organ systems failure and requires high complexity decision making for assessment and support, frequent evaluation and titration of therapies, application of advanced monitoring technologies and extensive interpretation of multiple databases. Critical Care Time devoted to patient care services described in this note independent of APP/resident  time is 35 minutes.   Cyril Mourning MD.  Tonny Bollman. [facility] Pager [phone] If no response call [phone]    07/23/2017, 8:32 AM

## 2017-07-23 NOTE — Transfer of Care (Signed)
Immediate Anesthesia Transfer of Care Note  Patient: George Stokes  Procedure(s) Performed: AN AD [place]  Patient Location: ICU  Anesthesia Type:General  Level of Consciousness: Patient remains intubated per anesthesia plan  Airway & Oxygen Therapy: Patient remains intubated per anesthesia plan  Post-op Assessment: Report given to RN  Post vital signs: stable  Last Vitals:  Vitals:   07/23/17 0000 07/23/17 0300  BP: (!) 131/96 137/80  Pulse:  90  Resp: (!) 24   Temp:    SpO2: 93% 97%    Last Pain:  Vitals:   07/22/17 2359  TempSrc: Oral  PainSc:       Patients Stated Pain Goal: 0 (75/05/18 3358)  Complications: No apparent anesthesia complications

## 2017-07-24 ENCOUNTER — Other Ambulatory Visit: Payer: Self-pay

## 2017-07-24 ENCOUNTER — Inpatient Hospital Stay (HOSPITAL_COMMUNITY): Payer: Medicare Other

## 2017-07-24 LAB — CBC
HEMATOCRIT: 36.7 % — AB (ref 39.0–52.0)
HEMOGLOBIN: 12.3 g/dL — AB (ref 13.0–17.0)
MCH: 30.1 pg (ref 26.0–34.0)
MCHC: 33.5 g/dL (ref 30.0–36.0)
MCV: 90 fL (ref 78.0–100.0)
Platelets: 266 10*3/uL (ref 150–400)
RBC: 4.08 MIL/uL — ABNORMAL LOW (ref 4.22–5.81)
RDW: 16.2 % — AB (ref 11.5–15.5)
WBC: 19.3 10*3/uL — ABNORMAL HIGH (ref 4.0–10.5)

## 2017-07-24 LAB — BASIC METABOLIC PANEL
ANION GAP: 6 (ref 5–15)
Anion gap: 5 (ref 5–15)
BUN: 11 mg/dL (ref 6–20)
BUN: 10 mg/dL (ref 6–20)
CALCIUM: 8.2 mg/dL — AB (ref 8.9–10.3)
CHLORIDE: 106 mmol/L (ref 101–111)
CHLORIDE: 107 mmol/L (ref 101–111)
CO2: 29 mmol/L (ref 22–32)
CO2: 29 mmol/L (ref 22–32)
CREATININE: 0.79 mg/dL (ref 0.61–1.24)
Calcium: 8 mg/dL — ABNORMAL LOW (ref 8.9–10.3)
Creatinine, Ser: 0.69 mg/dL (ref 0.61–1.24)
GFR calc Af Amer: >60 mL/min (ref >60)
GFR calc Af Amer: >60 mL/min (ref >60)
GFR calc non Af Amer: >60 mL/min (ref >60)
GFR calc non Af Amer: >60 mL/min (ref >60)
GLUCOSE: 130 mg/dL — AB (ref 65–99)
GLUCOSE: 132 mg/dL — AB (ref 65–99)
POTASSIUM: 3.8 mmol/L (ref 3.5–5.1)
Potassium: 3.9 mmol/L (ref 3.5–5.1)
Sodium: 141 mmol/L (ref 135–145)
Sodium: 141 mmol/L (ref 135–145)

## 2017-07-24 LAB — PHOSPHORUS: Phosphorus: 1.7 mg/dL — ABNORMAL LOW (ref 2.5–4.6)

## 2017-07-24 LAB — MAGNESIUM
Magnesium: 1.9 mg/dL (ref 1.7–2.4)
Magnesium: 1.9 mg/dL (ref 1.7–2.4)

## 2017-07-24 MED ORDER — OSMOLITE 1.5 CAL PO LIQD: 1000.0000 mL | ORAL | Status: DC

## 2017-07-24 MED ORDER — INSULIN ASPART 100 UNIT/ML IV SOLN: 10.0000 [IU] | Freq: Once | INTRAVENOUS | Status: DC

## 2017-07-24 MED ORDER — MAGNESIUM SULFATE 2 GM/50ML IV SOLN
2.0000 g | Freq: Once | INTRAVENOUS | Status: AC
  Administered 2017-07-24: 2 g via INTRAVENOUS
  Filled 2017-07-24: qty 50

## 2017-07-24 MED ORDER — SODIUM GLYCEROPHOSPHATE 1 MMOLE/ML IV SOLN
20.0000 mmol | Freq: Once | INTRAVENOUS | Status: AC
  Administered 2017-07-24: 20 mmol via INTRAVENOUS
  Filled 2017-07-24: qty 20

## 2017-07-24 MED ORDER — DEXTROSE 50 % IV SOLN: 1.0000 | Freq: Once | INTRAVENOUS | Status: DC

## 2017-07-24 MED ORDER — OSMOLITE 1.5 CAL PO LIQD
1000.0000 mL | ORAL | Status: DC
  Administered 2017-07-24: 1000 mL
  Filled 2017-07-24 (×3): qty 1000

## 2017-07-24 NOTE — Progress Notes (Signed)
eLink Physician-Brief Progress Note Patient Name: George Stokes DOB: Sep 27, 1945 MRN: [id number]   Date of Service  07/24/2017  HPI/Events of Note  Hypomag and hypophos  eICU Interventions  Mag and phos replaced     Intervention Category Intermediate Interventions: Electrolyte abnormality - evaluation and management  DETERDING,ELIZABETH 07/24/2017, 5:32 AM

## 2017-07-24 NOTE — Progress Notes (Signed)
Performed EKG for rhythm changes and it confirmed pt is in afib at this time. Notified nurse at [facility] and waiting any further orders

## 2017-07-24 NOTE — Progress Notes (Signed)
eLink Physician-Brief Progress Note Patient Name: George Stokes DOB: [birth year] MRN: [id number]   Date of Service  07/24/2017  HPI/Events of Note  Episode of AFIB wth controlled ventricular response with HR in 80's. Now appears to be back in NSR by beside monitor. BP = 104/69.   eICU Interventions  Will order: 1. BMP and Mg++ level STAT.     Intervention Category Major Interventions: Arrhythmia - evaluation and management  Sommer,Steven Cornelia Copa 07/24/2017, 10:39 PM

## 2017-07-24 NOTE — Progress Notes (Signed)
PULMONARY / CRITICAL CARE MEDICINE   Name: George Stokes MRN: [id number] DOB: Jun 18, 1946    ADMISSION DATE:  07/07/2017 CONSULTATION DATE:  07/20/2016  REFERRING MD:  Dr. Johna Sheriff  CHIEF COMPLAINT:  Post op vent dependence  HISTORY OF PRESENT ILLNESS:   72 year old male with PMH of HTN, COPD, Chronic hypoxia on home O2, and Etoh abuse, admitted to [facility] on 1/11 with complaints of nausea and vomiting since 12/23 failing outpatient treatment. At that time, also found to have AKI and CAP. Workup that admission included EGD, which demonstrated malignant esophageal stenosis. Biopsy revealed adenocarcinoma. J tube placed 1/18. Despite this, he continued to have nausea and vomiting, with worsening abdominal distension and found to have ascites leaking from around the J-tube. CT on 1/23 demonstrated high grade proximal small bowel obstruction. He was taken to the OR this evening for Ex-lap and revision of jejunostomy. Post operatively he remained on the vent and was transferred to the ICU.   CULTURES: 1/23 blood >>ng 1/23 tracheal aspirate >> candida 1/26 BAL >>  ANTIBIOTICS: Azithro 1/11 > 1/14 CTX 1/11 > 1/14 Unasyn 1/14 > 1/18 Cefazolin 1/18 >1/19 Augmentin 1/18 > 1/22 Ertapenem 1/23 >>  SIGNIFICANT EVENTS: 1/11  admit for nausea and vomiting 1/16  EGD demonstrated malignant esophageal stricture.  1/18  J tube placed 1/23  bowel obstruction, to OR for ex-lap, j'ostomy revised 1/26 am reintubated , bronchoscopy for left atelectasis  LINES/TUBES: J tube 1/18 > 1/23 >> ETT 1/23 >>1/24, 1/26 >> Foley catheter 1/23 >> OG tube 1/23 >>  SUBJECTIVE:   Remains intubated  - on low dose neo gtt Low gr febrile Awake , off sedation  VITAL SIGNS: BP 118/69   Pulse 90   Temp 99.5 F (37.5 C)   Resp 18   Ht 5' 7.25" (1.708 m)   Wt 143 lb 11.8 oz (65.2 kg)   SpO2 99%   BMI 22.35 kg/m   VENTILATOR SETTINGS: Vent Mode: PSV;CPAP FiO2 (%):  [40 %-100 %] 40 % Set Rate:  [18 bmp] 18  bmp Vt Set:  [530 mL] 530 mL PEEP:  [5 cmH20-10 cmH20] 5 cmH20 Pressure Support:  [8 cmH20] 8 cmH20 Plateau Pressure:  [16 cmH20-19 cmH20] 19 cmH20  INTAKE / OUTPUT: I/O last 3 completed shifts: In: 6141.1 [I.V.:3143.1; NG/GT:210; IV Piggyback:2788] Out: 4480 [Urine:930; Emesis/NG output:3550]  PHYSICAL EXAMINATION: General: thin adult male  Oral ETT, gesturing & able to write HEENT: MM pink/dry, no jvd, temporal wasting , OGT Neuro:alert, interactive MAE  CV: s1s2 rrr, no m/r/g PULM: even/non-labored, decreased on L, absent R GI: soft, midline abdominal dressing c/d/i, RLQ red rubber catheter with serosanguinous drainage around J tube site  Extremities: warm/dry, no edema  Skin: no rashes or lesions   LABS:  BMET Recent Labs  Lab 07/22/17 0316 07/23/17 0546 07/24/17 0321  NA 141 142 141  K 3.9 4.0 3.9  CL 100* 103 106  CO2 30 30 29   BUN 19 13 11   CREATININE 0.97 0.77 0.79  GLUCOSE 65 91 130*   Electrolytes Recent Labs  Lab 07/20/17 1956  07/22/17 0316 07/23/17 0546 07/24/17 0321  CALCIUM 8.5*   < > 8.6* 8.5* 8.2*  MG 1.3*  --   --  1.4* 1.9  PHOS 3.2  --   --  1.9* 1.7*   < > = values in this interval not displayed.   CBC Recent Labs  Lab 07/22/17 0316 07/23/17 0546 07/24/17 0321  WBC 23.8* 20.1* 19.3*  HGB  12.8* 14.1 12.3*  HCT 39.2 42.5 36.7*  PLT 217 226 266   Coag's No results for input(s): APTT, INR in the last 168 hours.  Sepsis Markers Recent Labs  Lab 07/20/17 1956 07/21/17 0318 07/22/17 0316 07/22/17 1538  LATICACIDVEN 2.3* 2.0*  --  1.0  PROCALCITON 0.43 0.36 0.22  --     ABG Recent Labs  Lab 07/22/17 0819 07/23/17 0215 07/23/17 0345  PHART 7.257* 7.206* 7.389  PCO2ART 76.3* 93.4* 51.1*  PO2ART 70.1* 61.6* 58.3*    Liver Enzymes Recent Labs  Lab 07/20/17 1956  AST 27  ALT 15*  ALKPHOS 60  BILITOT 0.5  ALBUMIN 2.5*    Cardiac Enzymes No results for input(s): TROPONINI, PROBNP in the last 168  hours.  Glucose Recent Labs  Lab 07/20/17 2315 07/21/17 0310 07/21/17 0751 07/22/17 0749 07/22/17 0826 07/22/17 1131  GLUCAP 93 88 76 61* 86 80   Imaging Dg Chest Port 1 View  Result Date: 07/24/2017 CLINICAL DATA:  Follow-up ventilator support. EXAM: PORTABLE CHEST 1 VIEW COMPARISON:  07/23/2017 FINDINGS: Endotracheal tube tip is 4 cm above the carina. Nasogastric tube enters the abdomen. Background emphysema. Left lung is otherwise clear. Persistent pneumonia and volume loss in the right lower lung. I think there is slight radiographic improvement. IMPRESSION: Slight improvement in lower right lung pneumonia. Electronically Signed   By: Paulina Fusi M.D.   On: 07/24/2017 06:53   Reviewed by me STUDIES:  CT chest/abdomen 1/11 > Patchy areas of nodular airspace density primarily in the right middle lobe and right upper lobe. Bilateral renal hypodense lesions, incompletely characterized. Ultrasound may provide better evaluation. No hydronephrosis or obstructing stone. 1/12 US renal > Left renal cysts. 1/16 EGD >  Malignant appearing esophageal stenosis,  1/23 CT abd > High-grade proximal small bowel obstruction with transition at the jejunostomy tube. There is a degree of closed loop physiology given the GE junction mass and severe stenosis. Dilated esophagus above the GE junction mass. There is shifting opacity at the right base concerning for recurrent aspiration. Small pleural effusions.Perfusion anomaly in the ventral right lobe liver without visible underlying cause. Attention on future staging scans.    DISCUSSION: 71yoM with HTN, COPD, Chronic hypoxia on home O2, and Etoh abuse, admitted to [facility] on 1/11 with N/V, AKI, and CAP, and found to have esophageal adenocarcinoma. J tube placed 1/18, complicated by continued N/V, worsening abdominal distension, and ascites leaking from around the J-tube. CT on 1/23 demonstrated high grade proximal small bowel obstruction. Revised  jejunostomy.  Extubated 1/24 am but developed  Rt atelectasis & reintubated 1/26  ASSESSMENT / PLAN:  PULMONARY A: Acute-on-Chronic Hypoxic Respiratory failure COPD Aspiration pneumonia Rt atelectasis - much improved on CXR 1/27 p-bronch P: Pulmonary hygiene - IS, chest percussion via bed, upright positioning , mucomyst nebs Brovana + pulmicort  Duoneb Q6 Drop  PEEP 5 SBTs ok but Will defer extubation x 24h to enable tracheal toilet  CARDIOVASCULAR A: Hx HTN Hypotensive post intubation -  P: Taper off neo gtt Hold home norvasc  PRN lopressor for HR PRN hydralazine for BP   RENAL A: AKI Hypokalemia Hypomag/ hypophos P: D51/2NS with 20KCL at 75 Trend BMP / urinary output Replace electrolytes as indicated - phos Avoid nephrotoxic agents  GASTROINTESTINAL A: SBO J-tube S/P Ex-Lap with J-Tube Revision  Protein calorie malnutrition P: NPO PPI Ct OGT to LIS - high drainage , Surgery ordering UGI series Tolerating trickle TFs  HEMATOLOGIC A: Anemia P: Trend CBC Heparin +  SCD's for DVT prophylaxis  INFECTIOUS A: Severe sepsis P: Continue ertapenem x 7ds total -Low PCT reassuring Monitor fever curve / WBC trend    ENDOCRINE A: At Risk for Hypoglycemia P: CBgs q 4h   NEUROLOGIC A: Hx Etoh Abuse P: Monitor for ETOH withdrawal , but too sensitive to ativan  Thiamine / folate  CIWA protocol    FAMILY  - Updates: Sister updated .  She would be surrogate HCPOA > he is not married / no children. She left for back home & brother should arrive today  - Inter-disciplinary family meet or Palliative Care meeting due by: 07/27/17  The patient is critically ill with multiple organ systems failure and requires high complexity decision making for assessment and support, frequent evaluation and titration of therapies, application of advanced monitoring technologies and extensive interpretation of multiple databases. Critical Care Time devoted to patient care services  described in this note independent of APP/resident  time is 35 minutes.   Cyril Mourning MD. Tonny Bollman. [place] [facility] Pager [phone] If no response call [phone]    07/24/2017, 8:31 AM

## 2017-07-24 NOTE — Progress Notes (Signed)
Patient ID: George Stokes, male   DOB: Mar 22, 1946, 72 y.o.   MRN: [id number] 4 Days Post-Op   Subjective: Alert on ventilator.  Denies abdominal pain.  Seems to be tolerating tube feeding at trickle rate.  Orogastric tube has had a large amount of drainage however.  Patient wants endotracheal tube out and extubation being considered.  Objective: Vital signs in last 24 hours: Temp:  [98.1 F (36.7 C)-100.4 F (38 C)] 99.5 F (37.5 C) (01/27 0700) Resp:  [14-26] 18 (01/27 0700) BP: (58-146)/(38-96) 118/69 (01/27 0700) SpO2:  [96 %-100 %] 99 % (01/27 0719) FiO2 (%):  [40 %-100 %] 40 % (01/27 0719) Weight:  [65.2 kg (143 lb 11.8 oz)] 65.2 kg (143 lb 11.8 oz) (01/27 0447) Last BM Date: 07/19/17  Intake/Output from previous day: 01/26 0701 - 01/27 0700 In: 5741.1 [I.V.:2743.1; NG/GT:210; IV Piggyback:2788] Out: 2430 [Urine:880; Emesis/NG output:1550] Intake/Output this shift: No intake/output data recorded.  General appearance: alert, cooperative, no distress and Chronically ill-appearing GI: Soft and nontender without significant distention Incision/Wound: Midline incision clean and dry.  J-tube site dry although has apparently had a small amount of drainage around the tube  Lab Results:  Recent Labs    07/23/17 0546 07/24/17 0321  WBC 20.1* 19.3*  HGB 14.1 12.3*  HCT 42.5 36.7*  PLT 226 266   BMET Recent Labs    07/23/17 0546 07/24/17 0321  NA 142 141  K 4.0 3.9  CL 103 106  CO2 30 29  GLUCOSE 91 130*  BUN 13 11  CREATININE 0.77 0.79  CALCIUM 8.5* 8.2*     Studies/Results: Dg Chest Port 1 View  Result Date: 07/24/2017 CLINICAL DATA:  Follow-up ventilator support. EXAM: PORTABLE CHEST 1 VIEW COMPARISON:  07/23/2017 FINDINGS: Endotracheal tube tip is 4 cm above the carina. Nasogastric tube enters the abdomen. Background emphysema. Left lung is otherwise clear. Persistent pneumonia and volume loss in the right lower lung. I think there is slight radiographic  improvement. IMPRESSION: Slight improvement in lower right lung pneumonia. Electronically Signed   By: Paulina Fusi M.D.   On: 07/24/2017 06:53   Dg Chest Port 1 View  Result Date: 07/23/2017 CLINICAL DATA:  Post intubation and enteric tube placement EXAM: PORTABLE CHEST 1 VIEW COMPARISON:  07/22/2017 FINDINGS: Endotracheal tube placed with tip measuring 3 cm above the carina. Enteric tube tip is coiled in the left upper quadrant consistent with location in the upper stomach. Volume loss and consolidation in the right lung with hyperinflation of the left lung. Probable right pleural effusion. Right lung changes are progressing since the previous study. No pneumothorax. IMPRESSION: Appliances appear in satisfactory position. Progression of consolidation, effusion, and volume loss in the right lung since previous study. Compensatory hyperinflation of the left lung. Electronically Signed   By: Burman Nieves M.D.   On: 07/23/2017 03:51    Anti-infectives: Anti-infectives (From admission, onward)   Start     Dose/Rate Route Frequency Ordered Stop   07/20/17 1400  ertapenem (INVANZ) 1 g in sodium chloride 0.9 % 50 mL IVPB     1 g 100 mL/hr over 30 Minutes Intravenous Every 24 hours 07/20/17 1209     07/16/17 0000  ceFAZolin (ANCEF) IVPB 2g/100 mL premix     2 g 200 mL/hr over 30 Minutes Intravenous Every 8 hours 07/15/17 1833 07/16/17 0054   07/15/17 0945  amoxicillin-clavulanate (AUGMENTIN) 250-62.5 MG/5ML suspension 500 mg     500 mg Oral Every 8 hours 07/15/17 0938 07/19/17 6962  07/15/17 0600  ceFAZolin (ANCEF) IVPB 2g/100 mL premix     2 g 200 mL/hr over 30 Minutes Intravenous On call to O.R. 07/14/17 1809 07/15/17 1632   07/11/17 1600  ampicillin-sulbactam (UNASYN) 1.5 g in sodium chloride 0.9 % 50 mL IVPB  Status:  Discontinued     1.5 g 100 mL/hr over 30 Minutes Intravenous Every 6 hours 07/11/17 1329 07/15/17 0940   07/08/17 1000  cefTRIAXone (ROCEPHIN) 1 g in dextrose 5 % 50 mL IVPB   Status:  Discontinued     1 g 100 mL/hr over 30 Minutes Intravenous Every 24 hours 07/08/17 0954 07/11/17 1315   07/08/17 1000  azithromycin (ZITHROMAX) 500 mg in dextrose 5 % 250 mL IVPB  Status:  Discontinued     500 mg 250 mL/hr over 60 Minutes Intravenous Every 24 hours 07/08/17 0954 07/11/17 1315      Assessment/Plan: s/p Procedure(s): EXPLORATORY LAPAROTOMY; REVISION OF JEJUNOSTOMY Esophageal cancer Pneumonia and ventilator dependent respiratory failure-per CCM, extubation being considered. He has very high orogastric output which is concerning.  He will need to keep the gastric tube and I discussed trying to replace with a nasogastric tube with nursing.  I would be very surprised if he has another obstruction at his J-tube site but prior to removing his gastric tube I would plan a limited upper GI series to make sure his stomach is emptying and small bowel is emptying past the J-tube. Increase J-tube feedings to 20 cc/h.    LOS: 16 days    Mariella Saa 07/24/2017

## 2017-07-25 ENCOUNTER — Inpatient Hospital Stay (HOSPITAL_COMMUNITY): Payer: Medicare Other

## 2017-07-25 ENCOUNTER — Ambulatory Visit: Payer: Medicare Other | Admitting: Internal Medicine

## 2017-07-25 DIAGNOSIS — R9431 Abnormal electrocardiogram [ECG] [EKG]: Secondary | ICD-10-CM

## 2017-07-25 LAB — BASIC METABOLIC PANEL
Anion gap: 5 (ref 5–15)
BUN: 11 mg/dL (ref 6–20)
CHLORIDE: 108 mmol/L (ref 101–111)
CO2: 28 mmol/L (ref 22–32)
Calcium: 7.9 mg/dL — ABNORMAL LOW (ref 8.9–10.3)
Creatinine, Ser: 0.67 mg/dL (ref 0.61–1.24)
GFR calc Af Amer: >60 mL/min (ref >60)
GFR calc non Af Amer: >60 mL/min (ref >60)
Glucose, Bld: 113 mg/dL — ABNORMAL HIGH (ref 65–99)
POTASSIUM: 3.9 mmol/L (ref 3.5–5.1)
Sodium: 141 mmol/L (ref 135–145)

## 2017-07-25 LAB — CULTURE, BLOOD (ROUTINE X 2)
Culture: NO GROWTH
Culture: NO GROWTH
SPECIAL REQUESTS: ADEQUATE
SPECIAL REQUESTS: ADEQUATE

## 2017-07-25 LAB — CBC
HCT: 38 % — ABNORMAL LOW (ref 39.0–52.0)
Hemoglobin: 12.5 g/dL — ABNORMAL LOW (ref 13.0–17.0)
MCH: 29.3 pg (ref 26.0–34.0)
MCHC: 32.9 g/dL (ref 30.0–36.0)
MCV: 89.2 fL (ref 78.0–100.0)
Platelets: 254 10*3/uL (ref 150–400)
RBC: 4.26 MIL/uL (ref 4.22–5.81)
RDW: 16.4 % — ABNORMAL HIGH (ref 11.5–15.5)
WBC: 15.5 10*3/uL — ABNORMAL HIGH (ref 4.0–10.5)

## 2017-07-25 LAB — CULTURE, BAL-QUANTITATIVE

## 2017-07-25 LAB — TROPONIN I: Troponin I: <0.03 ng/mL (ref <0.03)

## 2017-07-25 LAB — MAGNESIUM: Magnesium: 1.8 mg/dL (ref 1.7–2.4)

## 2017-07-25 LAB — CULTURE, BAL-QUANTITATIVE W GRAM STAIN

## 2017-07-25 LAB — PHOSPHORUS: Phosphorus: 1.9 mg/dL — ABNORMAL LOW (ref 2.5–4.6)

## 2017-07-25 MED ORDER — SODIUM CHLORIDE 0.9% FLUSH: 10.0000 mL | INTRAVENOUS | Status: DC | PRN

## 2017-07-25 MED ORDER — FREE WATER
50.0000 mL | Status: DC
  Administered 2017-07-25 – 2017-08-02 (×47): 50 mL

## 2017-07-25 MED ORDER — CHLORHEXIDINE GLUCONATE CLOTH 2 % EX PADS
6.0000 | MEDICATED_PAD | Freq: Every day | CUTANEOUS | Status: DC
  Administered 2017-07-26: 6 via TOPICAL

## 2017-07-25 MED ORDER — OSMOLITE 1.5 CAL PO LIQD
1000.0000 mL | ORAL | Status: DC
  Administered 2017-07-26 – 2017-07-27 (×2): 1000 mL
  Filled 2017-07-25 (×2): qty 1000

## 2017-07-25 MED ORDER — METOPROLOL TARTRATE 25 MG/10 ML ORAL SUSPENSION
25.0000 mg | Freq: Two times a day (BID) | ORAL | Status: DC
  Administered 2017-07-25 – 2017-08-02 (×15): 25 mg
  Filled 2017-07-25 (×19): qty 10

## 2017-07-25 MED ORDER — MAGNESIUM SULFATE 2 GM/50ML IV SOLN
2.0000 g | Freq: Once | INTRAVENOUS | Status: AC
  Administered 2017-07-25: 2 g via INTRAVENOUS
  Filled 2017-07-25: qty 50

## 2017-07-25 MED ORDER — DEXTROSE 5 % IV SOLN
30.0000 mmol | Freq: Once | INTRAVENOUS | Status: AC
  Administered 2017-07-25: 30 mmol via INTRAVENOUS
  Filled 2017-07-25: qty 10

## 2017-07-25 NOTE — Progress Notes (Signed)
Afternoon rounds:  In and out of atrial fibrillation.  Also 12-lead ECG suggests ST changes in the anterior and lateral leads.  He denies chest pain, also denies shortness of breath.  I suspect some of this is artifactual however certainly demand ischemia would be a concern.  Plan Have scheduled beta-blockade via tube Cycle troponins Echo pending I have asked cardiology to evaluate specifically for atrial fibrillation but also concerned about possible cardiac ischemia. We will hold off on thoracentesis for now, likely plan on this being elective tomorrow assuming no interventions from a cardiology standpoint  Erick Colace ACNP-BC [place] Pager # [phone] OR # [phone] if no answer

## 2017-07-25 NOTE — Progress Notes (Signed)
Day shift progress report  Patient extubated this morning. Orogastric tube removed. Nasogastric tube placed in the right nares. Patient has had a total 1700 output from NGT this shift.  Pt. has also been in and out of Afib rhythm. Asymptomatic. Possible artifact? ECG done. MD notified. Cardiology has been consulted. Repeat ECG showed NSR.  Thoracentesis procedure has been delayed till tomorrow.

## 2017-07-25 NOTE — Progress Notes (Signed)
eLink Physician-Brief Progress Note Patient Name: Ewin Rehberg DOB: 17-Aug-1945 MRN: [id number]   Date of Service  07/25/2017  HPI/Events of Note  Midline catheter placed by PICC team. No order for Midline catheter.   eICU Interventions  Will order Midline catheter placed.      Intervention Category Major Interventions: Other:  Lysle Dingwall 07/25/2017, 10:25 PM

## 2017-07-25 NOTE — Progress Notes (Signed)
PULMONARY / CRITICAL CARE MEDICINE   Name: George Stokes MRN: [id number] DOB: August 09, 1945    ADMISSION DATE:  07/07/2017 CONSULTATION DATE:  07/20/2016  REFERRING MD:  Dr. Johna Sheriff  CHIEF COMPLAINT:  Post op vent dependence  HISTORY OF PRESENT ILLNESS:   72 year old male with PMH of HTN, COPD, Chronic hypoxia on home O2, and Etoh abuse, admitted to [facility] on 1/11 with complaints of nausea and vomiting since 12/23 failing outpatient treatment. At that time, also found to have AKI and CAP. Workup that admission included EGD, which demonstrated malignant esophageal stenosis. Biopsy revealed adenocarcinoma. J tube placed 1/18. Despite this, he continued to have nausea and vomiting, with worsening abdominal distension and found to have ascites leaking from around the J-tube. CT on 1/23 demonstrated high grade proximal small bowel obstruction. He was taken to the OR this evening for Ex-lap and revision of jejunostomy. Post operatively he remained on the vent and was transferred to the ICU.   CULTURES: 1/23 blood >>ng 1/23 tracheal aspirate >> candida 1/26 BAL >>  ANTIBIOTICS: Azithro 1/11 > 1/14 CTX 1/11 > 1/14 Unasyn 1/14 > 1/18 Cefazolin 1/18 >1/19 Augmentin 1/18 > 1/22 Ertapenem 1/23 >>  SIGNIFICANT EVENTS: 1/11  admit for nausea and vomiting 1/16  EGD demonstrated malignant esophageal stricture.  1/18  J tube placed 1/23  bowel obstruction, to OR for ex-lap, j'ostomy revised 1/26 am reintubated , bronchoscopy for left atelectasis  LINES/TUBES: J tube 1/18 > 1/23 >> ETT 1/23 >>1/24, 1/26 >>1/28 Foley catheter 1/23 >> OG tube 1/23 >>1/28 ngt 1/28>>>  SUBJECTIVE:   Awake, interactive, looks excellent on spontaneous breathing trial  VITAL SIGNS: BP (!) 88/56   Pulse 90   Temp 99.7 F (37.6 C)   Resp 18   Ht 5' 7.25" (1.708 m)   Wt 144 lb 6.4 oz (65.5 kg)   SpO2 100%   BMI 22.45 kg/m    VENTILATOR SETTINGS: Vent Mode: PSV;CPAP FiO2 (%):  [40 %] 40 % Set Rate:  [18 bmp]  18 bmp Vt Set:  [530 mL] 530 mL PEEP:  [5 cmH20] 5 cmH20 Pressure Support:  [5 cmH20] 5 cmH20 Plateau Pressure:  [14 cmH20-18 cmH20] 14 cmH20  INTAKE / OUTPUT: I/O last 3 completed shifts: In: 4327.6 [I.V.:3318.8; Other:40; NG/GT:598.8; IV Piggyback:370] Out: 3210 [Urine:1210; Emesis/NG output:2000]  PHYSICAL EXAMINATION: General: 72 year old white male currently resting comfortably on ventilator no acute distress on pressure support ventilation HEENT: Normocephalic atraumatic orally intubated Pulmonary: Equal chest rise no accessory use, decreased right base Cardiac: Regular rate and rhythm no murmur rub or gallop Abdomen: Soft nontender, J-tube site dry, midline incision clean and dry GU: Clear yellow Extremities: Trace dependent edema warm, dry, brisk cap refill Neuro: Awake oriented following commands  LABS:  BMET Recent Labs  Lab 07/24/17 0321 07/24/17 2259 07/25/17 0333  NA 141 141 141  K 3.9 3.8 3.9  CL 106 107 108  CO2 29 29 28   BUN 11 10 11   CREATININE 0.79 0.69 0.67  GLUCOSE 130* 132* 113*   Electrolytes Recent Labs  Lab 07/23/17 0546 07/24/17 0321 07/24/17 2259 07/25/17 0333  CALCIUM 8.5* 8.2* 8.0* 7.9*  MG 1.4* 1.9 1.9 1.8  PHOS 1.9* 1.7*  --  1.9*   CBC Recent Labs  Lab 07/23/17 0546 07/24/17 0321 07/25/17 0333  WBC 20.1* 19.3* 15.5*  HGB 14.1 12.3* 12.5*  HCT 42.5 36.7* 38.0*  PLT 226 266 254   Coag's No results for input(s): APTT, INR in the last 168  hours.  Sepsis Markers Recent Labs  Lab 07/20/17 1956 07/21/17 0318 07/22/17 0316 07/22/17 1538  LATICACIDVEN 2.3* 2.0*  --  1.0  PROCALCITON 0.43 0.36 0.22  --     ABG Recent Labs  Lab 07/22/17 0819 07/23/17 0215 07/23/17 0345  PHART 7.257* 7.206* 7.389  PCO2ART 76.3* 93.4* 51.1*  PO2ART 70.1* 61.6* 58.3*    Liver Enzymes Recent Labs  Lab 07/20/17 1956  AST 27  ALT 15*  ALKPHOS 60  BILITOT 0.5  ALBUMIN 2.5*    Cardiac Enzymes No results for input(s):  TROPONINI, PROBNP in the last 168 hours.  Glucose Recent Labs  Lab 07/20/17 2315 07/21/17 0310 07/21/17 0751 07/22/17 0749 07/22/17 0826 07/22/17 1131  GLUCAP 93 88 76 61* 86 80   Imaging Dg Abd 1 View  Result Date: 07/25/2017 CLINICAL DATA:  Status post NG tube placement today. EXAM: ABDOMEN - 1 VIEW COMPARISON:  CT abdomen and pelvis 07/20/2017. Single-view of the chest 07/25/2017. FINDINGS: The patient has 2 NG tubes in place. Tip and side-port of 1 of the tubes are in the stomach. Tip of the second tube is at the gastroesophageal junction and should be advanced 7-8 cm. Endotracheal tube is unchanged and in good position. Lungs are emphysematous. Right basilar airspace disease and effusion are unchanged. Heart size is normal. No pneumothorax. IMPRESSION: Two NG tubes are identified. One is in good position. Tip of the second is at gastroesophageal junction and should be advanced 7-8 cm. Endotracheal tube in good position. No change in a right effusion and airspace disease. Emphysema. Electronically Signed   By: Drusilla Kanner M.D.   On: 07/25/2017 10:20   Dg Chest Port 1 View  Result Date: 07/25/2017 CLINICAL DATA:  Respiratory failure EXAM: PORTABLE CHEST 1 VIEW COMPARISON:  Portable chest x-ray of July 24, 2017 FINDINGS: There is increased density in the right mid and lower lung as compared to yesterday's study. The left lung is clear. The heart and pulmonary vascularity are normal. The endotracheal tube tip lies approximately 3.8 cm above the carina. The esophagogastric tube tip in proximal port project below the GE junction. IMPRESSION: Increased density on the right consistent with worsening atelectasis or pneumonia and likely a posterior layering pleural effusion. Underlying COPD. The support tubes are in reasonable position. Electronically Signed   By: David  [place] M.D.   On: 07/25/2017 06:55   Reviewed by me STUDIES:  CT chest/abdomen 1/11 > Patchy areas of nodular airspace  density primarily in the right middle lobe and right upper lobe. Bilateral renal hypodense lesions, incompletely characterized. Ultrasound may provide better evaluation. No hydronephrosis or obstructing stone. 1/12 US renal > Left renal cysts. 1/16 EGD >  Malignant appearing esophageal stenosis,  1/23 CT abd > High-grade proximal small bowel obstruction with transition at the jejunostomy tube. There is a degree of closed loop physiology given the GE junction mass and severe stenosis. Dilated esophagus above the GE junction mass. There is shifting opacity at the right base concerning for recurrent aspiration. Small pleural effusions.Perfusion anomaly in the ventral right lobe liver without visible underlying cause. Attention on future staging scans.    DISCUSSION: 71yoM with HTN, COPD, Chronic hypoxia on home O2, and Etoh abuse, admitted to [facility] on 1/11 with N/V, AKI, and CAP, and found to have esophageal adenocarcinoma. J tube placed 1/18, complicated by continued N/V, worsening abdominal distension, and ascites leaking from around the J-tube. CT on 1/23 demonstrated high grade proximal small bowel obstruction. Revised  jejunostomy. Extubated  1/24 am but developed  Rt atelectasis & reintubated 1/26  As of 1/28 he is again passed spontaneous breathing trial and was successfully extubated.  His x-ray does look a little worse today on exam, this is likely largely contributed to by what appears to be an evolving right effusion.  We will continue aggressive pulmonary hygiene measures, continue antibiotics to complete a 7-day course, and finally check bedside ultrasound.  If he has significant pleural effusion we will proceed with diagnostic and therapeutic thoracentesis  ASSESSMENT / PLAN: Resolved issues: Drug-related hypotension Acute kidney injury  Current problem list:  Esophageal cancer (adenocarcinoma) with malignant esophageal stenosis Plan  N.p.o. J-tube placed Has been seen by oncology,  eventually will need chemo and radiation therapy   Small bowel obstruction now status post exploratory laparotomy with J-tube revision last surgery on 1/23 Plan Continue n.p.o. Orogastric tube changed to nasogastric tube Continue tube feeds Further recommendations per surgery  Acute-on-Chronic Hypoxic Respiratory failure is setting of Aspiration pneumonia And Rt atelectasis  History of chronic obstructive pulmonary disease S/p  Bronch on 1/27 w/ improved aeration.  Evolving Right effusion -Portable chest x-ray personally reviewed:Support tubes in satisfactory positionThis demonstrates, but slight worsening of right basilar airspace disease particularly appears to be evolving layering pleural effusion -Passed spontaneous breathing trial ready for extubation Plan Extubate to nasal cannula Keep n.p.o., place NG tube to drainage as discussed with surgical team Continue scheduled bronchodilators Continue pulmonary hygiene including incentive spirometry, chest percussion and add flutter valve. No day #6 of 7 ertapenem Will evaluate right chest via ultrasound, if he has significant fluid given recent diagnosis of pneumonia will need diagnostic/therapeutic right thoracentesis  Hx HTN Plan Continue telemetry monitoring  New PAF Plan Repeat 12 lead Echo Rate control w/PRN bb for now given h/o ileus/SBO I'm reluctant to use CCB Will watch closely, may need systemic AC but would need to collaborate w/ surg team if we start this.   Fluid and electrolyte imbalance: Hypophosphatemia Plan Replace, recheck  Anemia No evidence of bleeding Plan Trend CBC Continue DVT prophylaxis   Hx Etoh Abuse, no evidence of withdrawal Plan Continue thiamine and folate  FAMILY  - Updates: Sister updated .  She would be surrogate HCPOA > he is not married / no children. She left for back home & brother should arrive today  - Inter-disciplinary family meet or Palliative Care meeting due by:  07/27/17  My critical care time 35 minutes  Simonne Martinet ACNP-BC [facility] Pulmonary/Critical Care Pager # [phone] OR # [phone] if no answer  07/25/2017, 11:19 AM

## 2017-07-25 NOTE — Addendum Note (Signed)
Addendum  created 07/25/17 0701 by Lollie Sails, CRNA   Charge Capture section accepted

## 2017-07-25 NOTE — Progress Notes (Signed)
5 Days Post-Op   Subjective/Chief Complaint: Alert communicating on vent, no abd pain, had bm tol tube feeds trickle, has 1200 out og tube    Objective: Vital signs in last 24 hours: Temp:  [98.4 F (36.9 C)-99.7 F (37.6 C)] 99.7 F (37.6 C) (01/28 0600) Resp:  [14-26] 18 (01/28 0600) BP: (59-155)/(33-88) 88/56 (01/28 0600) SpO2:  [90 %-100 %] 100 % (01/28 0802) FiO2 (%):  [40 %] 40 % (01/28 0818) Weight:  [65.5 kg (144 lb 6.4 oz)] 65.5 kg (144 lb 6.4 oz) (01/28 0500) Last BM Date: 07/19/17  Intake/Output from previous day: 01/27 0701 - 01/28 0700 In: 2664 [I.V.:1825.1; NG/GT:478.8; IV Piggyback:320] Out: 1960 [Urine:760; Emesis/NG output:1200] Intake/Output this shift: No intake/output data recorded.  General appearance: alert, cooperative, no distress and chronically ill-appearing GI: Soft and nontender without significant distention Midline incision clean and dry.  J-tube site dry    Lab Results:  Recent Labs    07/24/17 0321 07/25/17 0333  WBC 19.3* 15.5*  HGB 12.3* 12.5*  HCT 36.7* 38.0*  PLT 266 254   BMET Recent Labs    07/24/17 2259 07/25/17 0333  NA 141 141  K 3.8 3.9  CL 107 108  CO2 29 28  GLUCOSE 132* 113*  BUN 10 11  CREATININE 0.69 0.67  CALCIUM 8.0* 7.9*   PT/INR No results for input(s): LABPROT, INR in the last 72 hours. ABG Recent Labs    07/23/17 0215 07/23/17 0345  PHART 7.206* 7.389  HCO3 35.6* 30.2*    Studies/Results: Dg Chest Port 1 View  Result Date: 07/25/2017 CLINICAL DATA:  Respiratory failure EXAM: PORTABLE CHEST 1 VIEW COMPARISON:  Portable chest x-ray of July 24, 2017 FINDINGS: There is increased density in the right mid and lower lung as compared to yesterday's study. The left lung is clear. The heart and pulmonary vascularity are normal. The endotracheal tube tip lies approximately 3.8 cm above the carina. The esophagogastric tube tip in proximal port project below the GE junction. IMPRESSION: Increased density  on the right consistent with worsening atelectasis or pneumonia and likely a posterior layering pleural effusion. Underlying COPD. The support tubes are in reasonable position. Electronically Signed   By: David  [place] M.D.   On: 07/25/2017 06:55   Dg Chest Port 1 View  Result Date: 07/24/2017 CLINICAL DATA:  Follow-up ventilator support. EXAM: PORTABLE CHEST 1 VIEW COMPARISON:  07/23/2017 FINDINGS: Endotracheal tube tip is 4 cm above the carina. Nasogastric tube enters the abdomen. Background emphysema. Left lung is otherwise clear. Persistent pneumonia and volume loss in the right lower lung. I think there is slight radiographic improvement. IMPRESSION: Slight improvement in lower right lung pneumonia. Electronically Signed   By: Nelson Chimes M.D.   On: 07/24/2017 06:53    Anti-infectives: Anti-infectives (From admission, onward)   Start     Dose/Rate Route Frequency Ordered Stop   07/20/17 1400  ertapenem (INVANZ) 1 g in sodium chloride 0.9 % 50 mL IVPB     1 g 100 mL/hr over 30 Minutes Intravenous Every 24 hours 07/20/17 1209     07/16/17 0000  ceFAZolin (ANCEF) IVPB 2g/100 mL premix     2 g 200 mL/hr over 30 Minutes Intravenous Every 8 hours 07/15/17 1833 07/16/17 0054   07/15/17 0945  amoxicillin-clavulanate (AUGMENTIN) 250-62.5 MG/5ML suspension 500 mg     500 mg Oral Every 8 hours 07/15/17 0938 07/19/17 0928   07/15/17 0600  ceFAZolin (ANCEF) IVPB 2g/100 mL premix  2 g 200 mL/hr over 30 Minutes Intravenous On call to O.R. 07/14/17 1809 07/15/17 1632   07/11/17 1600  ampicillin-sulbactam (UNASYN) 1.5 g in sodium chloride 0.9 % 50 mL IVPB  Status:  Discontinued     1.5 g 100 mL/hr over 30 Minutes Intravenous Every 6 hours 07/11/17 1329 07/15/17 0940   07/08/17 1000  cefTRIAXone (ROCEPHIN) 1 g in dextrose 5 % 50 mL IVPB  Status:  Discontinued     1 g 100 mL/hr over 30 Minutes Intravenous Every 24 hours 07/08/17 0954 07/11/17 1315   07/08/17 1000  azithromycin (ZITHROMAX) 500 mg in  dextrose 5 % 250 mL IVPB  Status:  Discontinued     500 mg 250 mL/hr over 60 Minutes Intravenous Every 24 hours 07/08/17 0954 07/11/17 1315      Assessment/Plan: POD 10/4 j tube placement, redo j tube- Ingram/Hoxworth COPD oxygen dependent Alcohol abuse Malnutrition  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 Dalbert Batman 1/23 CT showed kink in small bowel at the level of jejunostomy causing SBO S/p ex-lap with revision of jejunostomy 1/23 Dr. Excell Seltzer  -continue tube feeds, can advance as tolerated -change og tube to ng tube, will get contrast study to ensure patency after extubation -hopefully extubated today ID -ertapenem 1/23>> FEN -NPO, IVF VTE -SCDs, SQ heparin  Follow up -Dr. Acquanetta Sit 07/25/2017

## 2017-07-25 NOTE — Addendum Note (Signed)
Addendum  created 07/25/17 0658 by Lollie Sails, CRNA   Intraprocedure Staff edited

## 2017-07-25 NOTE — Progress Notes (Signed)
Nutrition Follow-up  DOCUMENTATION CODES:   Non-severe (moderate) malnutrition in context of chronic illness  INTERVENTION:  - Will advance TF rate: Osmolite 1.5 @ 30 mL/hr and advance by 10 mL every 24 hours to reach goal rate Osmolite 1.5 @ 60 mL/hr. At goal rate, this regimen will provide 2160 kcal, 90 grams of protein, and 1097 mL free water. - Will order 50 mL free water per J-tube every 4 hours (300 mL/day).  Monitor magnesium, potassium, and phosphorus daily for at least 3 days, MD to replete as needed, as pt is at risk for refeeding syndrome given inadequate TF since admission, malnutrition, current hypophosphatemia.    NUTRITION DIAGNOSIS:   Moderate Malnutrition related to chronic illness, nausea, vomiting(ETOH abuse) as evidenced by percent weight loss, energy intake < or equal to 75% for > or equal to 1 month, moderate fat depletion, moderate muscle depletion. -ongoing  GOAL:   Patient will meet greater than or equal to 90% of their needs -unmet with current TF rate  MONITOR:   TF tolerance, Weight trends, Labs  ASSESSMENT:   72 y.o. male with medical history significant of COPD, CRF on 4 liters he thinks at home, htn comes in with over a week of nausea and vomiting with no abdominal pain or blood in vomit.  No fevers.  No diarrhea. No cough.  No chest pain.  He says he gets nauseated even after drinking liquids.  Pt referred for admission for report of wt loss and his n/v.  Significant Events: 1/13: Pt reports poor intakes for 10 days PTA. Was having N/V. 1/14:Esophageal stricturenoted 1/16: EGD reveals malignant esophageal mass 1/17: biopsies confirm adenocarcinoma per GI note 1/18: J-tube placed 1/19: TF initiated, Osmolite 1.5 @ 20 ml/hr 1/20: TF stopped after episodes of N/V, Osmolite1.5 restarted at 30 ml/hr, then stopped again. 1/23: ex lap and revision of J-tube d/t CT showing kink in small bowel causing SBO 1/24: extubated in AM 1/26: re-intubation d/t  low O2 sats on HFNC and BiPAP 1/28: extubated around 11:00 AM  Weight trending up; likely fluid related given inadequate TF/nutrition since admission. Weight currently +13 lbs/5.8 kg since 07/16/17. Used weight from 07/16/17 (59.7 kg) in re-estimating nutrition needs. Pt with J-tube and is currently receiving Osmolite 1.5 @ 20 mL/hr which is providing 720 kcal, 30 grams of protein, and 366 mL free water. Pt able to nod/shake head. He denies abdominal pain or nausea. Brother, who is at bedside, is asking about ability to advance TF rate.   Spoke with Laurey Arrow, PCCM NP, this AM. He reports NGT to LIS (~300cc in canister at time of RD visit earlier this AM) and hopeful for extubation today. Plan to leave NGT in place in order to continue LIS. Pt extubated a few minutes ago. Dr. Lear Ng note from this AM states a total of ~1.2L out from NGT. His note states "continue tube feeds, can advance as tolerated."  Medications reviewed; 1 mg IV folic acid/day, 15 mL liquid multivitamin/day, 100 mg IV thiamine/day.  Labs reviewed; Ca: 7.9 mg/dL, Phos: 1.9 mg/dL.   IVF: D5-1/2 NS-20 mEq KCl @ 10 mL/hr (41 kcal).      Diet Order:  Diet NPO time specified  EDUCATION NEEDS:   Not appropriate for education at this time  Skin:  Skin Assessment: Skin Integrity Issues: Skin Integrity Issues:: Incisions Incisions: abdominal (1/23)  Last BM:  1/22  Height:   Ht Readings from Last 1 Encounters:  07/20/17 5' 7.25" (1.708 m)  Weight:   Wt Readings from Last 1 Encounters:  07/25/17 144 lb 6.4 oz (65.5 kg)    Ideal Body Weight:  67.3 kg  BMI:  Body mass index is 22.45 kg/m.  Estimated Nutritional Needs:   Kcal:  1970-2150 (33-36 kcal/kg)  Protein:  83-100 grams (1.4-1.7 grams/kg)  Fluid:  2L/day      Jarome Matin, MS, RD, LDN, CNSC Inpatient Clinical Dietitian Pager # [phone] After hours/weekend pager # [phone]

## 2017-07-25 NOTE — Procedures (Signed)
Extubation Procedure Note  Patient Details:   Name: George Stokes DOB: 1945/07/25 MRN: [id number]   Airway Documentation:     Evaluation  O2 QQPY:19  complications: none patient tolerated procedure well.  Rhonchi Pt able to speak  Per CCM order pt extubated and placed on nasal cannula  Tolerated well, no complications.  07/25/2017, 11:02 AM

## 2017-07-25 NOTE — Progress Notes (Signed)
PT Cancellation Note  Patient Details Name: George Stokes MRN: [id number] DOB: 1946/04/22   Cancelled Treatment:     pt medically unable to participate.  Have asked LPT to review Epic as pt has been a cancel past 4 attempts.     Rica Koyanagi  PTA [facility]  Acute  Rehab Pager      [phone]

## 2017-07-25 NOTE — Consult Note (Signed)
Cardiology Consultation:   Patient ID: George Stokes; [id number]; [birth year]   Admit date: 07/07/2017 Date of Consult: 07/25/2017  Primary Care Provider: Patient, No Pcp Per Primary Cardiologist: New to Dr.Javi Bollman  Chief Complaint: nausea/vomiting  Patient Profile:   George Stokes is a 72 y.o. male with a hx of COPD with chronic respiratory failure, HTN, gout, longstanding tobacco use, alcohol use who is being seen today for the evaluation of abnormal EKG at the request of Anders Simmonds NP.  History of Present Illness:   George Stokes has no known cardiac history aside from heart murmur in teenage years. He was admitted 07/07/2017 with several weeks of nausea/vomiting, was found to initially have AKI and CAP. His admission included EGD which demonstrated malignant esophageal stenosis with pathology suspicious for adenocarcinoma. J tube was placed but he developed ascites leaking from tube and high grade proximal SBO on 07/20/17. He was taken back to the OR that day with exlap and revision of J tube. Subsequent post-op course notable for respiratory failure requiring re-intubation (1/26), RLL aspiration PNA, sepsis, AKI, malnutrition, anemia. He was extubated today. Ever since 1/27 there has been concern for intermittently irregular heart rhythm on telemetry. This was initially suspected to be atrial fib however at times it appears there is organized P wave activity and the rate does not change. The patient denies any acute/recent chest pain or dyspnea but continues to have an evolving right pleural effusion for which thoracentesis is being contemplated.    Past Medical History:  Diagnosis Date  . COPD (chronic obstructive pulmonary disease) (HCC)   . Dyspnea   . esophageal ca dx'd 07/13/17   mass at GE junction  . Esophageal mass   . Gout   . Heart murmur    during teenage years   . Hypertension   . On home oxygen therapy    1-2 L/M   . Pneumonia     Past Surgical History:  Procedure  Laterality Date  . ESOPHAGOGASTRODUODENOSCOPY (EGD) WITH PROPOFOL N/A 07/13/2017   Procedure: ESOPHAGOGASTRODUODENOSCOPY (EGD) WITH PROPOFOL;  Surgeon: Napoleon Form, MD;  Location: [facility];  Service: Endoscopy;  Laterality: N/A;  . LAPAROTOMY N/A 07/15/2017   Procedure: FEEDING JEJUNOSTOMY TUBE PLACEMENT;  Surgeon: Claud Kelp, MD;  Location: [facility];  Service: General;  Laterality: N/A;  . LAPAROTOMY N/A 07/20/2017   Procedure: EXPLORATORY LAPAROTOMY; REVISION OF JEJUNOSTOMY;  Surgeon: Glenna Fellows, MD;  Location: [facility];  Service: General;  Laterality: N/A;  . TONSILLECTOMY       Inpatient Medications: Scheduled Meds: . arformoterol  15 mcg Nebulization BID  . budesonide (PULMICORT) nebulizer solution  0.5 mg Nebulization BID  . chlorhexidine gluconate (MEDLINE KIT)  15 mL Mouth Rinse BID  . [START ON 07/26/2017] feeding supplement (OSMOLITE 1.5 CAL)  1,000 mL Per Tube Q24H  . folic acid  1 mg Intravenous Daily  . free water  50 mL Per Tube Q4H  . heparin  5,000 Units Subcutaneous Q8H  . mouth rinse  15 mL Mouth Rinse QID  . metoprolol tartrate  25 mg Per Tube BID  . multivitamin  15 mL Oral Daily  . neomycin-bacitracin-polymyxin   Topical Once  . pantoprazole (PROTONIX) IV  40 mg Intravenous Q12H  . thiamine injection  100 mg Intravenous Daily   Continuous Infusions: . sodium chloride    . ertapenem Stopped (07/24/17 1444)  . potassium PHOSPHATE IVPB (mmol) 30 mmol (07/25/17 1208)   PRN Meds: sodium chloride, acetaminophen, fentaNYL (SUBLIMAZE) injection, hydrALAZINE,  ipratropium-albuterol, metoprolol tartrate, ondansetron **OR** ondansetron (ZOFRAN) IV  Home Meds: Prior to Admission medications   Medication Sig Start Date End Date Taking? Authorizing Provider  albuterol (PROAIR HFA) 108 (90 Base) MCG/ACT inhaler 2 puffs every 4 hours as needed only  if your can't catch your breath 05/03/16  Yes Nyoka Cowden, MD  amLODipine (NORVASC) 10 MG tablet Take 1  tablet (10 mg total) by mouth daily. 01/04/17  Yes Sagardia, Eilleen Kempf, MD  clotrimazole-betamethasone (LOTRISONE) cream Apply 1 application topically 2 (two) times daily. 01/04/17  Yes Sagardia, Eilleen Kempf, MD  indomethacin (INDOCIN) 50 MG capsule Take 1 capsule (50 mg total) by mouth every 8 (eight) hours as needed. Patient taking differently: Take 50 mg by mouth every 8 (eight) hours as needed for mild pain.  01/04/17  Yes Sagardia, Eilleen Kempf, MD  ondansetron (ZOFRAN) 4 MG tablet Take 1 tablet (4 mg total) by mouth every 8 (eight) hours as needed for nausea or vomiting. 06/29/17  Yes Sagardia, Eilleen Kempf, MD  SYMBICORT 160-4.5 MCG/ACT inhaler Inhale 2 puffs into the lungs 2 (two) times daily. 01/24/17  Yes Nyoka Cowden, MD  HYDROcodone-acetaminophen (NORCO/VICODIN) 5-325 MG tablet Take 1 tablet by mouth every 6 (six) hours as needed. Patient not taking: Reported on 06/29/2017 05/01/15   Le, Thao P, DO  Tiotropium Bromide Monohydrate (SPIRIVA RESPIMAT) 2.5 MCG/ACT AERS Inhale 2 puffs into the lungs daily. Patient not taking: Reported on 06/29/2017 12/31/16   Nyoka Cowden, MD  zolpidem (AMBIEN) 5 MG tablet Take 1 tablet daily at bedtime as needed Patient not taking: Reported on 12/31/2016 05/01/15   Hamilton Capri P, DO    Allergies:   No Known Allergies  Social History:   Social History   Socioeconomic History  . Marital status: Single    Spouse name: Not on file  . Number of children: Not on file  . Years of education: Not on file  . Highest education level: Not on file  Social Needs  . Financial resource strain: Not on file  . Food insecurity - worry: Not on file  . Food insecurity - inability: Not on file  . Transportation needs - medical: Not on file  . Transportation needs - non-medical: Not on file  Occupational History  . Occupation: retired  Tobacco Use  . Smoking status: Former Smoker    Packs/day: 2.00    Years: 43.00    Pack years: 86.00    Types: Cigarettes    Last attempt  to quit: 05/03/2009    Years since quitting: 8.2  . Smokeless tobacco: Never Used  Substance and Sexual Activity  . Alcohol use: Yes    Alcohol/week: 0.0 oz    Comment: vodka daily;1-2 drinks in last 4 weeks   . Drug use: No  . Sexual activity: No  Other Topics Concern  . Not on file  Social History Narrative  . Not on file    Family History:   The patient's family history includes Cancer in his father; Dementia in his mother; Diabetes in his maternal grandmother; Gout in his brother and father; Kidney disease in his brother; Stroke in his maternal grandfather; Thyroid disease in his sister.  ROS:  Please see the history of present illness.  All other ROS reviewed and negative.     Physical Exam/Data:   Vitals:   07/25/17 0443 07/25/17 0500 07/25/17 0600 07/25/17 0802  BP:  110/72 (!) 88/56   Pulse:      Resp:  18 18   Temp:  99.3 F (37.4 C) 99.7 F (37.6 C)   TempSrc:      SpO2: 97% 99% 100% 100%  Weight:  144 lb 6.4 oz (65.5 kg)    Height:        Intake/Output Summary (Last 24 hours) at 07/25/2017 1354 Last data filed at 07/25/2017 0600 Gross per 24 hour  Intake 1935 ml  Output 1035 ml  Net 900 ml   Filed Weights   07/23/17 0500 07/24/17 0447 07/25/17 0500  Weight: 142 lb 3.2 oz (64.5 kg) 143 lb 11.8 oz (65.2 kg) 144 lb 6.4 oz (65.5 kg)   Body mass index is 22.45 kg/m.  General: Chronically ill appearing WM in no acute distress. Head: Normocephalic, atraumatic, sclera non-icteric, no xanthomas, nares are without discharge.  Neck: Negative for carotid bruits. JVD not elevated. Lungs: Decreased BS R base. No wheezes or rhonchi. Breathing is unlabored. Heart: RRR occasional ectopy with S1 S2. No murmurs, rubs, or gallops appreciated. Abdomen: Soft, non-tender, non-distended with normoactive bowel sounds. No hepatomegaly. No rebound/guarding. No obvious abdominal masses. Msk:  Strength and tone appear normal for age. Extremities: No clubbing or cyanosis. No edema.   Distal pedal pulses are 2+ and equal bilaterally. Neuro: Alert and oriented X 3. No facial asymmetry. No focal deficit. Moves all extremities spontaneously. Psych:  Responds to questions appropriately with a normal affect.  EKG: Multiple EKGs personally reviewed. There is irregularity in several of them either caused by artifact or possibly ectopy, but with subtle p wave activity present. Most recent EKG while holding especially still confirms NSR with sinus arrhythmia without acute ST-T changes. The EKGs with questionable anterior/lateral ST changes which we were asked to review earlier were uninterpretable due to baseline wander/artifact.  Relevant CV Studies: 2d echo pending.  Laboratory Data:  Chemistry Recent Labs  Lab 07/24/17 0321 07/24/17 2259 07/25/17 0333  NA 141 141 141  K 3.9 3.8 3.9  CL 106 107 108  CO2 29 29 28   GLUCOSE 130* 132* 113*  BUN 11 10 11   CREATININE 0.79 0.69 0.67  CALCIUM 8.2* 8.0* 7.9*  GFRNONAA >60 >60 >60  GFRAA >60 >60 >60  ANIONGAP 6 5 5     Recent Labs  Lab 07/20/17 1956  PROT 5.3*  ALBUMIN 2.5*  AST 27  ALT 15*  ALKPHOS 60  BILITOT 0.5   Hematology Recent Labs  Lab 07/23/17 0546 07/24/17 0321 07/25/17 0333  WBC 20.1* 19.3* 15.5*  RBC 4.66 4.08* 4.26  HGB 14.1 12.3* 12.5*  HCT 42.5 36.7* 38.0*  MCV 91.2 90.0 89.2  MCH 30.3 30.1 29.3  MCHC 33.2 33.5 32.9  RDW 16.2* 16.2* 16.4*  PLT 226 266 254   Cardiac EnzymesNo results for input(s): TROPONINI in the last 168 hours. No results for input(s): TROPIPOC in the last 168 hours.  BNPNo results for input(s): BNP, PROBNP in the last 168 hours.  DDimer No results for input(s): DDIMER in the last 168 hours.  Radiology/Studies:  Dg Abd 1 View  Result Date: 07/25/2017 CLINICAL DATA:  Status post NG tube placement today. EXAM: ABDOMEN - 1 VIEW COMPARISON:  CT abdomen and pelvis 07/20/2017. Single-view of the chest 07/25/2017. FINDINGS: The patient has 2 NG tubes in place. Tip and  side-port of 1 of the tubes are in the stomach. Tip of the second tube is at the gastroesophageal junction and should be advanced 7-8 cm. Endotracheal tube is unchanged and in good position. Lungs are emphysematous. Right basilar  airspace disease and effusion are unchanged. Heart size is normal. No pneumothorax. IMPRESSION: Two NG tubes are identified. One is in good position. Tip of the second is at gastroesophageal junction and should be advanced 7-8 cm. Endotracheal tube in good position. No change in a right effusion and airspace disease. Emphysema. Electronically Signed   By: Drusilla Kanner M.D.   On: 07/25/2017 10:20   Dg Chest Port 1 View  Result Date: 07/25/2017 CLINICAL DATA:  Respiratory failure EXAM: PORTABLE CHEST 1 VIEW COMPARISON:  Portable chest x-ray of July 24, 2017 FINDINGS: There is increased density in the right mid and lower lung as compared to yesterday's study. The left lung is clear. The heart and pulmonary vascularity are normal. The endotracheal tube tip lies approximately 3.8 cm above the carina. The esophagogastric tube tip in proximal port project below the GE junction. IMPRESSION: Increased density on the right consistent with worsening atelectasis or pneumonia and likely a posterior layering pleural effusion. Underlying COPD. The support tubes are in reasonable position. Electronically Signed   By: David  [place] M.D.   On: 07/25/2017 06:55   Dg Chest Port 1 View  Result Date: 07/24/2017 CLINICAL DATA:  Follow-up ventilator support. EXAM: PORTABLE CHEST 1 VIEW COMPARISON:  07/23/2017 FINDINGS: Endotracheal tube tip is 4 cm above the carina. Nasogastric tube enters the abdomen. Background emphysema. Left lung is otherwise clear. Persistent pneumonia and volume loss in the right lower lung. I think there is slight radiographic improvement. IMPRESSION: Slight improvement in lower right lung pneumonia. Electronically Signed   By: Paulina Fusi M.D.   On: 07/24/2017 06:53   Dg  Chest Port 1 View  Result Date: 07/23/2017 CLINICAL DATA:  Post intubation and enteric tube placement EXAM: PORTABLE CHEST 1 VIEW COMPARISON:  07/22/2017 FINDINGS: Endotracheal tube placed with tip measuring 3 cm above the carina. Enteric tube tip is coiled in the left upper quadrant consistent with location in the upper stomach. Volume loss and consolidation in the right lung with hyperinflation of the left lung. Probable right pleural effusion. Right lung changes are progressing since the previous study. No pneumothorax. IMPRESSION: Appliances appear in satisfactory position. Progression of consolidation, effusion, and volume loss in the right lung since previous study. Compensatory hyperinflation of the left lung. Electronically Signed   By: Burman Nieves M.D.   On: 07/23/2017 03:51   Dg Chest Port 1 View  Result Date: 07/22/2017 CLINICAL DATA:  Status postextubation.  Airspace consolidation. EXAM: PORTABLE CHEST 1 VIEW COMPARISON:  July 21, 2017 FINDINGS: Endotracheal tube and nasogastric tube have been removed. No pneumothorax. There is airspace consolidation throughout the right mid lower lung zones, increased from 1 day prior. There is a small right pleural effusion. The left lung is somewhat hyperexpanded. There is no edema or consolidation on the left. Heart size and pulmonary vascular normal. No adenopathy appreciable. Old healed fractures noted on the left. IMPRESSION: No pneumothorax. Increase in airspace consolidation in the right mid and lower lung zones with small right pleural effusion. Question progression of pneumonia versus aspiration. Both entities may be present concurrently. Left lung hyperexpanded but clear. Stable cardiac silhouette. Electronically Signed   By: Bretta Bang III M.D.   On: 07/22/2017 07:32    Assessment and Plan:   1. Irregular rhythm/abnormal EKG - telemetry is difficult to discern at times due to sinus arrhythmia and artifact, but there seems to be  regular P wave activity particularly prominent in lead V1. This is easier seen by using the  multiplier function on telemetry to stretch the height of each strip. Furthermore supporting this is that his rate has not changed much. No specific therapy is necessary at present time, but would continue to observe on telemetry as presence of PACs can infer a possible future risk of atrial fib. (Given his multiple comorbidities, h/o alcohol, and acute illness, we would not be surprised if this patient did develop clear cut atrial fibrillation in the future.) Regarding ST-T changes, repeat tracing without artifact does not show any concerning changes therefore would continue to monitor clinically.  2. 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  3. Tobacco/alcohol abuse - pt had been counseled regarding cessation earlier this admission.  For questions or updates, please contact [facility] Please consult [email or web address] for contact info under Cardiology/STEMI.    Signed, Laurann Montana, PA-C  07/25/2017 1:54 PM  Attending Note:   The patient was seen and examined.  Agree with assessment and plan as noted above.  Changes made to the above note as needed.  Patient seen and independently examined with Ronie Spies, PA .   We discussed all aspects of the encounter. I agree with the assessment and plan as stated above.  1.   Abn ECG .   ? Atrial fib The ECG / tele strips have lots of ectopy. Where it is intrepretable, it appears to be NSR and PACs I do not see convincing evidence of AFib. In addition, he is a very poor candidate for anticoagulation. Echo has been ordered.  This may help [place] with the dx ( eval of the MV inflow )    I have spent a total of 40 minutes with patient reviewing hospital  notes , telemetry, EKGs, labs and examining patient as well as establishing an assessment and plan that was discussed with the patient. > 50% of  time was spent in direct patient care.  No active cardiac issues. Will sign off call for questions   Alvia Grove., MD, [facility] 07/25/2017, 2:56 PM [address]  [address] Office [phone] Pager [phone]

## 2017-07-26 ENCOUNTER — Inpatient Hospital Stay (HOSPITAL_COMMUNITY): Payer: Medicare Other

## 2017-07-26 DIAGNOSIS — J90 Pleural effusion, not elsewhere classified: Secondary | ICD-10-CM

## 2017-07-26 LAB — COMPREHENSIVE METABOLIC PANEL
ALBUMIN: 1.8 g/dL — AB (ref 3.5–5.0)
ALT: 15 U/L — AB (ref 17–63)
AST: 16 U/L (ref 15–41)
Alkaline Phosphatase: 77 U/L (ref 38–126)
Anion gap: 7 (ref 5–15)
BUN: 10 mg/dL (ref 6–20)
CO2: 28 mmol/L (ref 22–32)
CREATININE: 0.61 mg/dL (ref 0.61–1.24)
Calcium: 8.3 mg/dL — ABNORMAL LOW (ref 8.9–10.3)
Chloride: 103 mmol/L (ref 101–111)
GFR calc Af Amer: >60 mL/min (ref >60)
GFR calc non Af Amer: >60 mL/min (ref >60)
GLUCOSE: 87 mg/dL (ref 65–99)
POTASSIUM: 4.2 mmol/L (ref 3.5–5.1)
Sodium: 138 mmol/L (ref 135–145)
Total Bilirubin: 0.8 mg/dL (ref 0.3–1.2)
Total Protein: 4.7 g/dL — ABNORMAL LOW (ref 6.5–8.1)

## 2017-07-26 LAB — CBC
HEMATOCRIT: 39.3 % (ref 39.0–52.0)
Hemoglobin: 12.8 g/dL — ABNORMAL LOW (ref 13.0–17.0)
MCH: 29.3 pg (ref 26.0–34.0)
MCHC: 32.6 g/dL (ref 30.0–36.0)
MCV: 89.9 fL (ref 78.0–100.0)
Platelets: 305 10*3/uL (ref 150–400)
RBC: 4.37 MIL/uL (ref 4.22–5.81)
RDW: 16.3 % — AB (ref 11.5–15.5)
WBC: 17.4 10*3/uL — AB (ref 4.0–10.5)

## 2017-07-26 LAB — CHOLESTEROL, TOTAL: Cholesterol: 115 mg/dL (ref 0–200)

## 2017-07-26 LAB — BODY FLUID CELL COUNT WITH DIFFERENTIAL
LYMPHS FL: 2 %
Monocyte-Macrophage-Serous Fluid: 18 % — ABNORMAL LOW (ref 50–90)
Neutrophil Count, Fluid: 80 % — ABNORMAL HIGH (ref 0–25)
Total Nucleated Cell Count, Fluid: 3313 cu mm — ABNORMAL HIGH (ref 0–1000)

## 2017-07-26 LAB — TROPONIN I

## 2017-07-26 LAB — PROTEIN, TOTAL: TOTAL PROTEIN: 5 g/dL — AB (ref 6.5–8.1)

## 2017-07-26 LAB — PROTEIN, PLEURAL OR PERITONEAL FLUID: Total protein, fluid: <3 g/dL

## 2017-07-26 LAB — LACTATE DEHYDROGENASE, PLEURAL OR PERITONEAL FLUID: LD FL: 83 U/L — AB (ref 3–23)

## 2017-07-26 LAB — PHOSPHORUS: Phosphorus: 2.9 mg/dL (ref 2.5–4.6)

## 2017-07-26 LAB — LACTATE DEHYDROGENASE: LDH: 112 U/L (ref 98–192)

## 2017-07-26 LAB — MAGNESIUM: Magnesium: 1.6 mg/dL — ABNORMAL LOW (ref 1.7–2.4)

## 2017-07-26 MED ORDER — INDOMETHACIN 25 MG/5ML PO SUSP: 50.0000 mg | Freq: Once | ORAL | Status: DC

## 2017-07-26 MED ORDER — MAGNESIUM SULFATE 4 GM/100ML IV SOLN
4.0000 g | Freq: Once | INTRAVENOUS | Status: AC
  Administered 2017-07-26: 4 g via INTRAVENOUS
  Filled 2017-07-26: qty 100

## 2017-07-26 MED ORDER — IOPAMIDOL (ISOVUE-300) INJECTION 61%
INTRAVENOUS | Status: AC
  Filled 2017-07-26: qty 50

## 2017-07-26 MED ORDER — CHLORHEXIDINE GLUCONATE CLOTH 2 % EX PADS
6.0000 | MEDICATED_PAD | Freq: Every day | CUTANEOUS | Status: DC
  Administered 2017-07-27 – 2017-08-01 (×7): 6 via TOPICAL

## 2017-07-26 MED ORDER — IPRATROPIUM-ALBUTEROL 0.5-2.5 (3) MG/3ML IN SOLN
3.0000 mL | Freq: Four times a day (QID) | RESPIRATORY_TRACT | Status: DC
  Administered 2017-07-26 – 2017-07-29 (×15): 3 mL via RESPIRATORY_TRACT
  Filled 2017-07-26 (×15): qty 3

## 2017-07-26 MED ORDER — INDOMETHACIN 25 MG SUPPOSITORY
25.0000 mg | Freq: Once | RECTAL | Status: DC
  Filled 2017-07-26: qty 1

## 2017-07-26 MED ORDER — INDOMETHACIN 50 MG RE SUPP
25.0000 mg | Freq: Once | RECTAL | Status: AC
  Administered 2017-07-26: 25 mg via RECTAL
  Filled 2017-07-26: qty 1

## 2017-07-26 MED ORDER — LIDOCAINE HCL 2 % EX GEL
CUTANEOUS | Status: AC
  Filled 2017-07-26: qty 30

## 2017-07-26 MED ORDER — FLUCONAZOLE 40 MG/ML PO SUSR
400.0000 mg | Freq: Once | ORAL | Status: DC
  Filled 2017-07-26: qty 10

## 2017-07-26 MED ORDER — BUTAMBEN-TETRACAINE-BENZOCAINE 2-2-14 % EX AERO
INHALATION_SPRAY | CUTANEOUS | Status: AC
  Filled 2017-07-26: qty 20

## 2017-07-26 MED ORDER — FLUCONAZOLE 40 MG/ML PO SUSR: 200.0000 mg | Freq: Every day | ORAL | Status: DC

## 2017-07-26 NOTE — Progress Notes (Signed)
PT Cancellation Note  Patient Details Name: George Stokes MRN: [id number] DOB: Apr 27, 1946   Cancelled Treatment:    Reason Eval/Treat Not Completed: Fatigue/lethargy limiting ability to participate Pt away for procedures earlier and now reports fatigue.  Pt encouraged to mobilize however he is not agreeable at this time.  He does agree to mobilize tomorrow; brother states mornings are better.   Berthe Oley,KATHrine E 07/26/2017, 2:38 PM Carmelia Bake, PT, DPT 07/26/2017 Pager: [phone]

## 2017-07-26 NOTE — Progress Notes (Signed)
[facility] afternoon follow-up     Interval/events   S/p thora earlier got 900 ml cloudy pleural fluid    Vitals/hemodynamics/gtts   BP 122/66 (BP Location: Left Arm)   Pulse 100   Temp 98.8 F (37.1 C) (Oral)   Resp 18   Ht 5' 7.25" (1.708 m)   Wt 137 lb 9.1 oz (62.4 kg)   SpO2 90%   BMI 21.39 kg/m    . sodium chloride    . ertapenem Stopped (07/26/17 1350)  . magnesium sulfate 1 - 4 g bolus IVPB      Intake/Output Summary (Last 24 hours) at 07/26/2017 1348 Last data filed at 07/26/2017 0500 Gross per 24 hour  Intake 250 ml  Output 3300 ml  Net -3050 ml    Physical exam   General: resting in bed. HENT: NCAT. MMM. Voice quality stronger  Pulm: rhonchus cough. But improved breath sounds on right c/w earlier  Card: RRR Abd: soft, not tender. Neuro: intact.    Recent Labs  Lab 07/24/17 2259 07/25/17 0333 07/26/17 0119  NA 141 141 138  K 3.8 3.9 4.2  CL 107 108 103  CO2 29 28 28   BUN 10 11 10   CREATININE 0.69 0.67 0.61  GLUCOSE 132* 113* 87   Recent Labs  Lab 07/24/17 0321 07/25/17 0333 07/26/17 0119  HGB 12.3* 12.5* 12.8*  HCT 36.7* 38.0* 39.3  WBC 19.3* 15.5* 17.4*  PLT 266 254 305   ABG    Component Value Date/Time   PHART 7.389 07/23/2017 0345   PCO2ART 51.1 (H) 07/23/2017 0345   PO2ART 58.3 (L) 07/23/2017 0345   HCO3 30.2 (H) 07/23/2017 0345   O2SAT 89.1 07/23/2017 0345    Impression/plan   Esophageal mass w/ associated malignant stricture (presumed esophageal cancer) Recent aspiration PNA Probable parapneumonic right pleural effusion s/p drainage.  Recent SBO s/p J-tube revision.  -cont wean O2 -mobilize -cont abx until cultures back on pleural fluid -spoke w/ GI-->plan for EGD on Thursday. May consider dilation if seems reasonable but would be high risk -f/u UGI series per surg to eval J tube  -keep in ICU given poor cough and difficulty w/ oral secretions.     Erick Colace ACNP-BC [place] Pager # [phone] OR # [phone] if no answer

## 2017-07-26 NOTE — Progress Notes (Signed)
Date:  July 26, 2017 Chart reviewed for concurrent status and case management needs.  Will continue to follow patient progress.  Discharge Planning: following for needs.  None present at this time of review. Expected discharge date: July 29, 2017 George Stokes, BSN, [place], [place]

## 2017-07-26 NOTE — Progress Notes (Signed)
eLink Physician-Brief Progress Note Patient Name: George Stokes DOB: 31-May-1946 MRN: [id number]   Date of Service  07/26/2017  HPI/Events of Note  Patient c/o "gout pain". Requests home indocin. Creatinine = 0.61.   eICU Interventions  Will order: 1. Indocin 50 mg per tube X 1 now.      Intervention Category Major Interventions: Other:  Lysle Dingwall 07/26/2017, 9:10 PM

## 2017-07-26 NOTE — Procedures (Signed)
Thoracentesis Procedure Note  Pre-operative Diagnosis: right pleural effusion   Post-operative Diagnosis: same  Indications: evaluation of pleural effusion    Procedure Details  Consent: Informed consent was obtained. Risks of the procedure were discussed including: infection, bleeding, pain, pneumothorax.  Under sterile conditions the patient was positioned. Betadine solution and sterile drapes were utilized.  1% buffered lidocaine was used to anesthetize the  rib space which was evaluated via real time [place]. Fluid was obtained without any difficulties and minimal blood loss.  A dressing was applied to the wound and wound care instructions were provided.   Findings 900 ml of cloudy pleural fluid was obtained. A sample was sent to Pathology for cytogenetics, flow, and cell counts, as well as for infection analysis.  Complications:  None; patient tolerated the procedure well.          Condition: stable  Plan A follow up chest x-ray was ordered. Bed Rest for 0 hours.   Erick Colace ACNP-BC [place] Pager # [phone] OR # [phone] if no answer

## 2017-07-26 NOTE — Progress Notes (Signed)
Patient ID: George Stokes, male   DOB: [birth year], 72 y.o.   MRN: [id number]    Progress Note   Subjective  Pt is off the floor for UGI   Objective   Vital signs in last 24 hours: Temp:  [98.4 F (36.9 C)-99 F (37.2 C)] 98.8 F (37.1 C) (01/29 0800) Pulse Rate:  [94] 94 (01/29 0605) Resp:  [13-28] 18 (01/29 0800) BP: (102-151)/(58-94) 122/66 (01/29 0800) SpO2:  [88 %-99 %] 92 % (01/29 0858) Weight:  [137 lb 9.1 oz (62.4 kg)] 137 lb 9.1 oz (62.4 kg) (01/29 0500) Last BM Date: 07/19/17 General:    Not examined (pt not in room)  Lab Results: Recent Labs    07/24/17 0321 07/25/17 0333 07/26/17 0119  WBC 19.3* 15.5* 17.4*  HGB 12.3* 12.5* 12.8*  HCT 36.7* 38.0* 39.3  PLT 266 254 305   BMET Recent Labs    07/24/17 2259 07/25/17 0333 07/26/17 0119  NA 141 141 138  K 3.8 3.9 4.2  CL 107 108 103  CO2 29 28 28   GLUCOSE 132* 113* 87  BUN 10 11 10   CREATININE 0.69 0.67 0.61  CALCIUM 8.0* 7.9* 8.3*   LFT Recent Labs    07/26/17 0119  PROT 4.7*  ALBUMIN 1.8*  AST 16  ALT 15*  ALKPHOS 77  BILITOT 0.8   PT/INR No results for input(s): LABPROT, INR in the last 72 hours.  Studies/Results: Dg Abd 1 View  Result Date: 07/26/2017 CLINICAL DATA:  NG tube advancement EXAM: ABDOMEN - 1 VIEW COMPARISON:  07/25/2017 FINDINGS: NG tube looped in the distal esophagus. Right lower lobe opacity with small right pleural effusion. Skin staples overlying the upper abdomen. IMPRESSION: NG tube looped in the distal esophagus. These results will be called to the ordering clinician or representative by the Radiologist Assistant, and communication documented in the PACS or zVision Dashboard. Electronically Signed   By: Charline Bills M.D.   On: 07/26/2017 09:32   Dg Abd 1 View  Result Date: 07/25/2017 CLINICAL DATA:  Status post NG tube placement today. EXAM: ABDOMEN - 1 VIEW COMPARISON:  CT abdomen and pelvis 07/20/2017. Single-view of the chest 07/25/2017. FINDINGS: The patient has  2 NG tubes in place. Tip and side-port of 1 of the tubes are in the stomach. Tip of the second tube is at the gastroesophageal junction and should be advanced 7-8 cm. Endotracheal tube is unchanged and in good position. Lungs are emphysematous. Right basilar airspace disease and effusion are unchanged. Heart size is normal. No pneumothorax. IMPRESSION: Two NG tubes are identified. One is in good position. Tip of the second is at gastroesophageal junction and should be advanced 7-8 cm. Endotracheal tube in good position. No change in a right effusion and airspace disease. Emphysema. Electronically Signed   By: Drusilla Kanner M.D.   On: 07/25/2017 10:20   Dg Chest Port 1 View  Result Date: 07/26/2017 CLINICAL DATA:  72 year old male with obstructing esophageal cancer. Postoperative day 6 status post abdominal surgery for small bowel obstruction at a jejunostomy tube site. EXAM: PORTABLE CHEST 1 VIEW COMPARISON:  07/25/2017 and earlier. FINDINGS: Portable AP semi upright view at 0454 hours. Extubated. Enteric tube remains in place, but is looped in the distal esophagus level. Yesterday the tube appear to course into the stomach. Continued volume loss in the right lung with mild rightward shift of the mediastinum. Superimposed veiling right lung opacity suggesting pleural effusion. Large left lung volume. The left lung remains clear. Stable  mediastinal contours. IMPRESSION: 1. Enteric tube now is looped in the distal esophagus, whereas yesterday tube appeared to course into the stomach. 2. Extubated. 3. Suspect combined lower lobe collapse and pleural effusion in the right lung. Consider mucous plug. 4. The left lung remains clear. Electronically Signed   By: Odessa Fleming M.D.   On: 07/26/2017 07:41   Dg Chest Port 1 View  Result Date: 07/25/2017 CLINICAL DATA:  Respiratory failure EXAM: PORTABLE CHEST 1 VIEW COMPARISON:  Portable chest x-ray of July 24, 2017 FINDINGS: There is increased density in the right mid  and lower lung as compared to yesterday's study. The left lung is clear. The heart and pulmonary vascularity are normal. The endotracheal tube tip lies approximately 3.8 cm above the carina. The esophagogastric tube tip in proximal port project below the GE junction. IMPRESSION: Increased density on the right consistent with worsening atelectasis or pneumonia and likely a posterior layering pleural effusion. Underlying COPD. The support tubes are in reasonable position. Electronically Signed   By: David  [place] M.D.   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 s/p EGD and bx 1/16  Which showed markedly atypical cells suspicious for adenocarcinoma   S/p Jejunostomy 1/18 SBO 1/23 secondary to kink in small bowel at jejunostomy level- s/p exp lap  And revision of jejunostomy 1/23  Vent dependence post op - extubated yesterday  To have UGI today today to assure patency of j tube  Gi will plan EGD later this week with repeat bx to try to confirm diagnosis    Contact  Amy Esterwood, P.A.-C               [phone]     LOS: 18 days   Amy Esterwood  07/26/2017, 10:22 AM   ________________________________________________________________________  [facility] MD note:  I eviewed the data and agree with the assessment and plan described above.  His previous EGD biopsies were 'suspicious for adenocarcinoma' but not definitive and so we've been asked by medical oncology to repeat the biopsies.  Will plan on this in next few days, pending clinical course.   Rob Bunting, MD [facility] Gastroenterology Pager [phone]

## 2017-07-26 NOTE — Progress Notes (Signed)
RN attempted to pull back and advance NG tube already in place, without success.  NG tube completely removed and a new one retrieved.  Placement attempted in other nare; NG tube placed to the initially marked line, however, KUB showed the tube coiled in esophagus; NG tube pulled.  Surgery notified of unsuccessful placements; order placed for NG tube to be placed by IR.    Will continue to monitor.

## 2017-07-26 NOTE — Progress Notes (Signed)
PULMONARY / CRITICAL CARE MEDICINE   Name: George Stokes MRN: [id number] DOB: Oct 03, 1945    ADMISSION DATE:  07/07/2017 CONSULTATION DATE:  07/20/2016  REFERRING MD:  Dr. Johna Sheriff  CHIEF COMPLAINT:  Post op vent dependence  HISTORY OF PRESENT ILLNESS:   72 year old male with PMH of HTN, COPD, Chronic hypoxia on home O2, and Etoh abuse, admitted to [facility] on 1/11 with complaints of nausea and vomiting since 12/23 failing outpatient treatment. At that time, also found to have AKI and CAP. Workup that admission included EGD, which demonstrated malignant esophageal stenosis. Biopsy revealed adenocarcinoma. J tube placed 1/18. Despite this, he continued to have nausea and vomiting, with worsening abdominal distension and found to have ascites leaking from around the J-tube. CT on 1/23 demonstrated high grade proximal small bowel obstruction. He was taken to the OR this evening for Ex-lap and revision of jejunostomy. Post operatively he remained on the vent and was transferred to the ICU.   CULTURES: 1/23 blood >>ng 1/23 tracheal aspirate >> candida 1/26 BAL: candida 50K  ANTIBIOTICS: Azithro 1/11 > 1/14 CTX 1/11 > 1/14 Unasyn 1/14 > 1/18 Cefazolin 1/18 >1/19 Augmentin 1/18 > 1/22 Ertapenem 1/23 >> Diflucan 1/29>>>  SIGNIFICANT EVENTS: 1/11  admit for nausea and vomiting 1/16  EGD demonstrated malignant esophageal stricture.  1/18  J tube placed 1/23  bowel obstruction, to OR for ex-lap, j'ostomy revised 1/26 am reintubated , bronchoscopy for left atelectasis  LINES/TUBES: J tube 1/18 > 1/23 >> ETT 1/23 >>1/24, 1/26 >>1/28 Foley catheter 1/23 >> OG tube 1/23 >>1/28 ngt 1/28>>>  SUBJECTIVE:   Non-eventful night.   VITAL SIGNS: BP 122/66 (BP Location: Left Arm)   Pulse 94   Temp 98.8 F (37.1 C) (Oral)   Resp 18   Ht 5' 7.25" (1.708 m)   Wt 137 lb 9.1 oz (62.4 kg)   SpO2 92%   BMI 21.39 kg/m   VENTILATOR SETTINGS:    INTAKE / OUTPUT:  Intake/Output Summary (Last 24  hours) at 07/26/2017 4098 Last data filed at 07/26/2017 0500 Gross per 24 hour  Intake 250 ml  Output 3825 ml  Net -3575 ml     PHYSICAL EXAMINATION: General: 72 year old male, frail. No acute distress  HENT: NCAT. No JVD. MMM voice quality still poor Pulm: rhonchus cough. Decreased BS on right. No accessory use  Card: RRR no MRG Abd: J tube dressing intact + bowel sounds EXT: brisk CR. Warm, trace LE edema Neuro/psych: awake and alert. Moves all extremities   LABS:  BMET Recent Labs  Lab 07/24/17 2259 07/25/17 0333 07/26/17 0119  NA 141 141 138  K 3.8 3.9 4.2  CL 107 108 103  CO2 29 28 28   BUN 10 11 10   CREATININE 0.69 0.67 0.61  GLUCOSE 132* 113* 87   Electrolytes Recent Labs  Lab 07/24/17 0321 07/24/17 2259 07/25/17 0333 07/26/17 0119  CALCIUM 8.2* 8.0* 7.9* 8.3*  MG 1.9 1.9 1.8 1.6*  PHOS 1.7*  --  1.9* 2.9   CBC Recent Labs  Lab 07/24/17 0321 07/25/17 0333 07/26/17 0119  WBC 19.3* 15.5* 17.4*  HGB 12.3* 12.5* 12.8*  HCT 36.7* 38.0* 39.3  PLT 266 254 305   Coag's No results for input(s): APTT, INR in the last 168 hours.  Sepsis Markers Recent Labs  Lab 07/20/17 1956 07/21/17 0318 07/22/17 0316 07/22/17 1538  LATICACIDVEN 2.3* 2.0*  --  1.0  PROCALCITON 0.43 0.36 0.22  --     ABG Recent  Labs  Lab 07/22/17 0819 07/23/17 0215 07/23/17 0345  PHART 7.257* 7.206* 7.389  PCO2ART 76.3* 93.4* 51.1*  PO2ART 70.1* 61.6* 58.3*    Liver Enzymes Recent Labs  Lab 07/20/17 1956 07/26/17 0119  AST 27 16  ALT 15* 15*  ALKPHOS 60 77  BILITOT 0.5 0.8  ALBUMIN 2.5* 1.8*    Cardiac Enzymes Recent Labs  Lab 07/25/17 1344 07/25/17 1916 07/26/17 0119  TROPONINI <0.03 <0.03 <0.03    Glucose Recent Labs  Lab 07/20/17 2315 07/21/17 0310 07/21/17 0751 07/22/17 0749 07/22/17 0826 07/22/17 1131  GLUCAP 93 88 76 61* 86 80   Imaging Dg Abd 1 View  Result Date: 07/25/2017 CLINICAL DATA:  Status post NG tube placement today. EXAM:  ABDOMEN - 1 VIEW COMPARISON:  CT abdomen and pelvis 07/20/2017. Single-view of the chest 07/25/2017. FINDINGS: The patient has 2 NG tubes in place. Tip and side-port of 1 of the tubes are in the stomach. Tip of the second tube is at the gastroesophageal junction and should be advanced 7-8 cm. Endotracheal tube is unchanged and in good position. Lungs are emphysematous. Right basilar airspace disease and effusion are unchanged. Heart size is normal. No pneumothorax. IMPRESSION: Two NG tubes are identified. One is in good position. Tip of the second is at gastroesophageal junction and should be advanced 7-8 cm. Endotracheal tube in good position. No change in a right effusion and airspace disease. Emphysema. Electronically Signed   By: Drusilla Kanner M.D.   On: 07/25/2017 10:20   Dg Chest Port 1 View  Result Date: 07/26/2017 CLINICAL DATA:  72 year old male with obstructing esophageal cancer. Postoperative day 6 status post abdominal surgery for small bowel obstruction at a jejunostomy tube site. EXAM: PORTABLE CHEST 1 VIEW COMPARISON:  07/25/2017 and earlier. FINDINGS: Portable AP semi upright view at 0454 hours. Extubated. Enteric tube remains in place, but is looped in the distal esophagus level. Yesterday the tube appear to course into the stomach. Continued volume loss in the right lung with mild rightward shift of the mediastinum. Superimposed veiling right lung opacity suggesting pleural effusion. Large left lung volume. The left lung remains clear. Stable mediastinal contours. IMPRESSION: 1. Enteric tube now is looped in the distal esophagus, whereas yesterday tube appeared to course into the stomach. 2. Extubated. 3. Suspect combined lower lobe collapse and pleural effusion in the right lung. Consider mucous plug. 4. The left lung remains clear. Electronically Signed   By: Odessa Fleming M.D.   On: 07/26/2017 07:41   Reviewed by me STUDIES:  CT chest/abdomen 1/11 > Patchy areas of nodular airspace density  primarily in the right middle lobe and right upper lobe. Bilateral renal hypodense lesions, incompletely characterized. Ultrasound may provide better evaluation. No hydronephrosis or obstructing stone. 1/12 US renal > Left renal cysts. 1/16 EGD >  Malignant appearing esophageal stenosis,  1/23 CT abd > High-grade proximal small bowel obstruction with transition at the jejunostomy tube. There is a degree of closed loop physiology given the GE junction mass and severe stenosis. Dilated esophagus above the GE junction mass. There is shifting opacity at the right base concerning for recurrent aspiration. Small pleural effusions.Perfusion anomaly in the ventral right lobe liver without visible underlying cause. Attention on future staging scans.    DISCUSSION: 71yoM with HTN, COPD, Chronic hypoxia on home O2, and Etoh abuse, admitted to [facility] on 1/11 with N/V, AKI, and CAP, and found to have esophageal adenocarcinoma. J tube placed 1/18, complicated by continued N/V, worsening abdominal  distension, and ascites leaking from around the J-tube. CT on 1/23 demonstrated high grade proximal small bowel obstruction. Revised  jejunostomy. Extubated 1/24 am but developed  Rt atelectasis & reintubated 1/26  As of 1/29 Now extubated. Cardiac rhythm looks a little better. No distress. CXR however w/ decreased aeration on right. Unclear to what extent is atx and what is effusion. I am concerned about his ability to swallow his oral secretions given the stricture. Will reach out to GI today to ensure f/u. I wonder about dilation (risk/benefit) in this situation. Suspect given malignancy would be risky but if could help avoid recurrent resp failure may be advantageous. For diagnostic/therapeutic thora today   ASSESSMENT / PLAN: Resolved issues: Drug-related hypotension Acute kidney injury  Current problem list:  Esophageal cancer (adenocarcinoma) with malignant esophageal stenosis Not able to swallow oral  secretions.  Plan  Npo Seen by onc. Will need chemo and radiation Will ask IR to place NGT.  Will also ask GI to see again. I wonder if it is possible to dilate this stricture given aspiration risk??   Small bowel obstruction now status post exploratory laparotomy with J-tube revision last surgery on 1/23 Plan Cont NPO tubefeeds per surg  For UGI via J tube to ensure GI patency    Acute-on-Chronic Hypoxic Respiratory failure is setting of Aspiration pneumonia And Rt atelectasis (No bacterial orgs) History of chronic obstructive pulmonary disease S/p  Bronch on 1/27 w/ improved aeration.  Evolving Right effusion -Portable chest x-ray personally reviewed: NGT is coiled in esophagus. Right sided airspace disease looks worse. Probably represents worsening effusion, +/- atx  Plan Diagnostic/therapeutic thora today Day # 7 ertapenem (this will be his last dose)-->IF pleural fluid looks exudate may extend the course until cultures back  Cont wean Oxygen  Extubate to nasal cannula duoneb qid Cont flutter and IS NPO (he may need to have NGT placed by IR) OOB   Hx HTN Plan Cont tele  Cont bb   Irregular rhythm w/ negative trop I.  -seen by cards. Hemodynamically stable  Plan F/u echo Cont bb  Cont tele Ensure nml K and Mg   Fluid and electrolyte imbalance: Hypomagnesemia  Plan Cont to trend chem and replace as indicated  Replace Mg & repeat in am   Anemia No evidence of bleeding Plan Trend cbc Cont DVT prophylaxis    Hx Etoh Abuse, no evidence of withdrawal Plan Cont thiamine and folate   FAMILY  - Updates: Sister updated .  She would be surrogate HCPOA > he is not married / no children. She left for back home & brother should arrive today  - Inter-disciplinary family meet or Palliative Care meeting due by: 07/27/17  My critical care time 34 minutes   Simonne Martinet ACNP-BC [facility] Pulmonary/Critical Care Pager # [phone] OR # [phone] if no  answer  07/26/2017, 9:05 AM

## 2017-07-26 NOTE — Progress Notes (Signed)
6 Days Post-Op   Subjective/Chief Complaint: Awake, alert, tube feeds off, extubated yesterday   Objective: Vital signs in last 24 hours: Temp:  [98.4 F (36.9 C)-99.7 F (37.6 C)] 98.4 F (36.9 C) (01/29 0500) Resp:  [13-28] 13 (01/29 0500) BP: (88-151)/(56-94) 110/82 (01/29 0500) SpO2:  [88 %-100 %] 88 % (01/29 0500) FiO2 (%):  [40 %] 40 % (01/28 0818) Weight:  [62.4 kg (137 lb 9.1 oz)] 62.4 kg (137 lb 9.1 oz) (01/29 0500) Last BM Date: 07/19/17  Intake/Output from previous day: 01/28 0701 - 01/29 0700 In: 250 [NG/GT:50; IV Piggyback:50] Out: 3825 [Urine:950; Emesis/NG output:2350; Drains:525] Intake/Output this shift: Total I/O In: 250 [Other:150; NG/GT:50; IV Piggyback:50] Out: 1125 [Urine:475; Emesis/NG output:650]   General appearance:alert, cooperative, no distress andchronically ill-appearing UT:MLYY and nontender without significant distention Midline incision clean and dry. J-tube site dry   Lab Results:  Recent Labs    07/25/17 0333 07/26/17 0119  WBC 15.5* 17.4*  HGB 12.5* 12.8*  HCT 38.0* 39.3  PLT 254 305   BMET Recent Labs    07/25/17 0333 07/26/17 0119  NA 141 138  K 3.9 4.2  CL 108 103  CO2 28 28  GLUCOSE 113* 87  BUN 11 10  CREATININE 0.67 0.61  CALCIUM 7.9* 8.3*   PT/INR No results for input(s): LABPROT, INR in the last 72 hours. ABG No results for input(s): PHART, HCO3 in the last 72 hours.  Invalid input(s): PCO2, PO2  Studies/Results: Dg Abd 1 View  Result Date: 07/25/2017 CLINICAL DATA:  Status post NG tube placement today. EXAM: ABDOMEN - 1 VIEW COMPARISON:  CT abdomen and pelvis 07/20/2017. Single-view of the chest 07/25/2017. FINDINGS: The patient has 2 NG tubes in place. Tip and side-port of 1 of the tubes are in the stomach. Tip of the second tube is at the gastroesophageal junction and should be advanced 7-8 cm. Endotracheal tube is unchanged and in good position. Lungs are emphysematous. Right basilar airspace  disease and effusion are unchanged. Heart size is normal. No pneumothorax. IMPRESSION: Two NG tubes are identified. One is in good position. Tip of the second is at gastroesophageal junction and should be advanced 7-8 cm. Endotracheal tube in good position. No change in a right effusion and airspace disease. Emphysema. Electronically Signed   By: Inge Rise M.D.   On: 07/25/2017 10:20   Dg Chest Port 1 View  Result Date: 07/25/2017 CLINICAL DATA:  Respiratory failure EXAM: PORTABLE CHEST 1 VIEW COMPARISON:  Portable chest x-ray of July 24, 2017 FINDINGS: There is increased density in the right mid and lower lung as compared to yesterday's study. The left lung is clear. The heart and pulmonary vascularity are normal. The endotracheal tube tip lies approximately 3.8 cm above the carina. The esophagogastric tube tip in proximal port project below the GE junction. IMPRESSION: Increased density on the right consistent with worsening atelectasis or pneumonia and likely a posterior layering pleural effusion. Underlying COPD. The support tubes are in reasonable position. Electronically Signed   By: David  Martinique M.D.   On: 07/25/2017 06:55    Anti-infectives: Anti-infectives (From admission, onward)   Start     Dose/Rate Route Frequency Ordered Stop   07/20/17 1400  ertapenem (INVANZ) 1 g in sodium chloride 0.9 % 50 mL IVPB     1 g 100 mL/hr over 30 Minutes Intravenous Every 24 hours 07/20/17 1209     07/16/17 0000  ceFAZolin (ANCEF) IVPB 2g/100 mL premix  2 g 200 mL/hr over 30 Minutes Intravenous Every 8 hours 07/15/17 1833 07/16/17 0054   07/15/17 0945  amoxicillin-clavulanate (AUGMENTIN) 250-62.5 MG/5ML suspension 500 mg     500 mg Oral Every 8 hours 07/15/17 0938 07/19/17 0928   07/15/17 0600  ceFAZolin (ANCEF) IVPB 2g/100 mL premix     2 g 200 mL/hr over 30 Minutes Intravenous On call to O.R. 07/14/17 1809 07/15/17 1632   07/11/17 1600  ampicillin-sulbactam (UNASYN) 1.5 g in sodium  chloride 0.9 % 50 mL IVPB  Status:  Discontinued     1.5 g 100 mL/hr over 30 Minutes Intravenous Every 6 hours 07/11/17 1329 07/15/17 0940   07/08/17 1000  cefTRIAXone (ROCEPHIN) 1 g in dextrose 5 % 50 mL IVPB  Status:  Discontinued     1 g 100 mL/hr over 30 Minutes Intravenous Every 24 hours 07/08/17 0954 07/11/17 1315   07/08/17 1000  azithromycin (ZITHROMAX) 500 mg in dextrose 5 % 250 mL IVPB  Status:  Discontinued     500 mg 250 mL/hr over 60 Minutes Intravenous Every 24 hours 07/08/17 0954 07/11/17 1315      Assessment/Plan: POD 11/5 j tube placement, redo j tube- Ingram/[place] COPD oxygen dependent Alcohol abuse Malnutrition  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 Dalbert Batman 1/23 CT showed kink in small bowel at the level of jejunostomy causing SBO S/p ex-lap with revision of jejunostomy 1/23 Dr. Excell Seltzer -continue tube feeds, can advance as tolerated, they are off now after extubation per ccm -ugi today to ensure patency of gi tract through j tube -when done in icu plan was to transfer back to [facility] -ertapenem 1/23>> FEN -NPO, IVF VTE -SCDs, SQ heparin Follow up -Dr. Acquanetta Sit 07/26/2017

## 2017-07-27 ENCOUNTER — Inpatient Hospital Stay (HOSPITAL_COMMUNITY): Payer: Medicare Other

## 2017-07-27 DIAGNOSIS — R9431 Abnormal electrocardiogram [ECG] [EKG]: Secondary | ICD-10-CM

## 2017-07-27 LAB — PH, BODY FLUID: pH, Body Fluid: 7.6

## 2017-07-27 LAB — COMPREHENSIVE METABOLIC PANEL
ALK PHOS: 94 U/L (ref 38–126)
ALT: 22 U/L (ref 17–63)
AST: 36 U/L (ref 15–41)
Albumin: 1.6 g/dL — ABNORMAL LOW (ref 3.5–5.0)
Anion gap: 3 — ABNORMAL LOW (ref 5–15)
BILIRUBIN TOTAL: 0.5 mg/dL (ref 0.3–1.2)
BUN: 14 mg/dL (ref 6–20)
CHLORIDE: 105 mmol/L (ref 101–111)
CO2: 32 mmol/L (ref 22–32)
CREATININE: 0.78 mg/dL (ref 0.61–1.24)
Calcium: 8 mg/dL — ABNORMAL LOW (ref 8.9–10.3)
Glucose, Bld: 166 mg/dL — ABNORMAL HIGH (ref 65–99)
Potassium: 3.8 mmol/L (ref 3.5–5.1)
Sodium: 140 mmol/L (ref 135–145)
Total Protein: 4.4 g/dL — ABNORMAL LOW (ref 6.5–8.1)

## 2017-07-27 LAB — ECHOCARDIOGRAM COMPLETE
Height: 67.25 in
Weight: 2201.07 oz

## 2017-07-27 LAB — CBC
HEMATOCRIT: 34.5 % — AB (ref 39.0–52.0)
Hemoglobin: 11.7 g/dL — ABNORMAL LOW (ref 13.0–17.0)
MCH: 30.2 pg (ref 26.0–34.0)
MCHC: 33.9 g/dL (ref 30.0–36.0)
MCV: 88.9 fL (ref 78.0–100.0)
PLATELETS: 299 10*3/uL (ref 150–400)
RBC: 3.88 MIL/uL — ABNORMAL LOW (ref 4.22–5.81)
RDW: 16.2 % — AB (ref 11.5–15.5)
WBC: 14 10*3/uL — AB (ref 4.0–10.5)

## 2017-07-27 LAB — MAGNESIUM: MAGNESIUM: 2.4 mg/dL (ref 1.7–2.4)

## 2017-07-27 MED ORDER — OSMOLITE 1.5 CAL PO LIQD
1000.0000 mL | ORAL | Status: DC
  Administered 2017-07-28: 1000 mL
  Filled 2017-07-27: qty 1000

## 2017-07-27 MED ORDER — INDOMETHACIN 50 MG RE SUPP
25.0000 mg | Freq: Once | RECTAL | Status: AC
  Administered 2017-07-27: 25 mg via RECTAL
  Filled 2017-07-27: qty 1

## 2017-07-27 NOTE — Progress Notes (Signed)
Spoke to GI  We will await cultures from thora. If neg then will go ahead and stop abx.  Dr Ardis Hughs spoke w/ Dr Benay Spice paln will be to start XRT +- Chemo as soon as possible. From our stand-point this would be as soon as we know cultures are cleared.  Hopefully w/ XRT the esophageal stricture will improve. Would continue strict NPO  Erick Colace ACNP-BC [facility] Pager # [phone] OR # [phone] if no answer

## 2017-07-27 NOTE — Progress Notes (Addendum)
Nutrition Follow-up  DOCUMENTATION CODES:   Non-severe (moderate) malnutrition in context of chronic illness  INTERVENTION:  Will increase Osmolite 1.5 to 40 mL/hr today via J-tube. Advance by 10 mL every 24 hours to reach goal rate of Osmolite 1.5 @ 60 mL/hr.  Monitor magnesium, potassium, and phosphorus daily for at least 3 days, MD to replete as needed, as pt is at risk for refeeding syndrome given malnutrition, inadequate nutrition for 3-4 weeks.   NUTRITION DIAGNOSIS:   Moderate Malnutrition related to chronic illness, nausea, vomiting(ETOH abuse) as evidenced by percent weight loss, energy intake < or equal to 75% for > or equal to 1 month, moderate fat depletion, moderate muscle depletion. -ongoing  GOAL:   Patient will meet greater than or equal to 90% of their needs -unmet with current TF rate, advancing TF rate.   MONITOR:   TF tolerance, Weight trends, Labs, I & O's  ASSESSMENT:   72 y.o. male with medical history significant of COPD, CRF on 4 liters he thinks at home, htn comes in with over a week of nausea and vomiting with no abdominal pain or blood in vomit.  No fevers.  No diarrhea. No cough.  No chest pain.  He says he gets nauseated even after drinking liquids.  Pt referred for admission for report of wt loss and his n/v.  Significant Events: 1/13: Pt reports poor intakes for 10 days PTA.  1/14:Esophageal stricturenoted 1/16: EGD reveals malignant esophageal mass 1/17: biopsies confirm adenocarcinoma 1/18: J-tube placed 1/19: TF initiated, Osmolite 1.5 @ 20 ml/hr 1/20: TF stopped after episodes of N/V, Osmolite1.5 restarted at 30 ml/hr, then stopped again. 1/23: ex lap and revision of J-tube d/t CT showing kink in small bowel causing SBO 1/24: extubated in AM, TF held 1/26: re-intubation  1/28: extubated in AM; OGT removed and NGT placed 1/29 NGT removed, TF re-started: Osmolite 1.5 @ 30 mL/hr    Weight had been stable 1/23-1/25 and then began trending  up 1/26-1/28. Weight 1/29 and today (1/20) are now consistent with weights from 1/23-1/25. NGT was removed yesterday and pt denies abdominal pain or nausea since removal. J-tube in place and pt currently receiving Osmolite 1.5 @ 30 mL/hr which is providing 1080 kcal, 45 grams of protein, and 549 mL free water. Goal rate for TF: Osmolite 1.5 @ 60 mL/hr which will provide 2160 kcal, 90 grams of protein, and 1097 mL free water. Order also in place for 50 mL free water every 4 hours (300 mL/day). Pt denies any discomfort related to TF.  Per Surgery PA and PCCM NP notes this AM, plan for repeat EGD tomorrow (1/31). PCCM NP note states attempt for esophageal dilation may be made during that time if it is safe.   Medications reviewed; 1 mg IV folic acid/day, 4 g IV Mg sulfate x1 run yesterday, 15 mL liquid multivitamin/day, 40 mg IV Protonix BID, 100 mg IV thiamine/day.  Labs reviewed; Ca: 8 mg/dL.    ADDENDUM: GI MD note from a few minutes ago states plan to cancel EGD for tomorrow d/t co-morbidities. Plan at this time is to start in-patient treatment for esophageal cancer once pt has been transferred out of ICU.    Diet Order:  Diet NPO time specified  EDUCATION NEEDS:   Not appropriate for education at this time  Skin:  Skin Assessment: Skin Integrity Issues: Skin Integrity Issues:: Incisions Incisions: abdominal (1/23)  Last BM:  1/29  Height:   Ht Readings from Last 1 Encounters:  07/20/17 5' 7.25" (1.708 m)    Weight:   Wt Readings from Last 1 Encounters:  07/27/17 137 lb 9.1 oz (62.4 kg)    Ideal Body Weight:  67.3 kg  BMI:  Body mass index is 21.39 kg/m.  Estimated Nutritional Needs:   Kcal:  1970-2150 (33-36 kcal/kg)  Protein:  83-100 grams (1.4-1.7 grams/kg)  Fluid:  2L/day      Jarome Matin, MS, RD, LDN, CNSC Inpatient Clinical Dietitian Pager # [phone] After hours/weekend pager # [phone]

## 2017-07-27 NOTE — Progress Notes (Signed)
   I met with him and his son, son in law. Discussed his case with Dr. Benay Spice.  We agreed to forgo repeat EGD/biopsy given his mounting co-morbidities. 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  I discussed the above with Marni Griffon as well.

## 2017-07-27 NOTE — Progress Notes (Signed)
7 Days Post-Op    CC:  Esophageal mass/stenosis/obstruction    Subjective: Comfortable with current tube feedings.  About 1/2 way to goal.  He is very malnourished and obvious on exam.  Objective: Vital signs in last 24 hours: Temp:  [98.1 F (36.7 C)-99.3 F (37.4 C)] 98.4 F (36.9 C) (01/30 0600) Pulse Rate:  [71-100] 71 (01/29 1528) Resp:  [9-22] 16 (01/30 0600) BP: (82-127)/(43-74) 87/56 (01/30 0600) SpO2:  [87 %-99 %] 97 % (01/30 0600) Weight:  [62.4 kg (137 lb 9.1 oz)] 62.4 kg (137 lb 9.1 oz) (01/30 0500) Last BM Date: 07/26/17 370 IV NPO Tube feed 825 Urine 965 Right pleural effusion with thoracentesis - 900 ml cloudy fluid  No BM recorded Afebrile, hypotensive BP 80's - 90's range CMP OK WBC 14.0 H/H 11.7/34.5 Post thoracentesis CXR yesterday:  Persistent right lower lobe interstitial and airspace disease concerning for pneumonia. Partial clearing of the right upper lobe.  Mild atelectasis left base.  COPD.  Interval removal of NG tube.  Intake/Output from previous day: 01/29 0701 - 01/30 0700 In: 1295.3 [I.V.:170; NG/GT:825.3; IV Piggyback:200] Out: 965 [Urine:965] Intake/Output this shift: No intake/output data recorded.  General appearance: alert, cooperative and no distress Resp: clear to auscultation bilaterally GI: soft, no complaints of pain, incision looks fine and FT site looks good.  he is not overly distended, just full, BS hypoacitve.  Soft nontender on exam.  Lab Results:  Recent Labs    07/26/17 0119 07/27/17 0307  WBC 17.4* 14.0*  HGB 12.8* 11.7*  HCT 39.3 34.5*  PLT 305 299    BMET Recent Labs    07/26/17 0119 07/27/17 0307  NA 138 140  K 4.2 3.8  CL 103 105  CO2 28 32  GLUCOSE 87 166*  BUN 10 14  CREATININE 0.61 0.78  CALCIUM 8.3* 8.0*   PT/INR No results for input(s): LABPROT, INR in the last 72 hours.  Recent Labs  Lab 07/20/17 1956 07/26/17 0119 07/26/17 1357 07/27/17 0307  AST 27 16  --  36  ALT 15* 15*  --   22  ALKPHOS 60 77  --  94  BILITOT 0.5 0.8  --  0.5  PROT 5.3* 4.7* 5.0* 4.4*  ALBUMIN 2.5* 1.8*  --  1.6*     Lipase     Component Value Date/Time   LIPASE 39 07/12/2017 0342     Medications: . chlorhexidine gluconate (MEDLINE KIT)  15 mL Mouth Rinse BID  . Chlorhexidine Gluconate Cloth  6 each Topical Daily  . feeding supplement (OSMOLITE 1.5 CAL)  1,000 mL Per Tube Q24H  . folic acid  1 mg Intravenous Daily  . free water  50 mL Per Tube Q4H  . heparin  5,000 Units Subcutaneous Q8H  . ipratropium-albuterol  3 mL Nebulization QID  . mouth rinse  15 mL Mouth Rinse QID  . metoprolol tartrate  25 mg Per Tube BID  . multivitamin  15 mL Oral Daily  . neomycin-bacitracin-polymyxin   Topical Once  . pantoprazole (PROTONIX) IV  40 mg Intravenous Q12H  . thiamine injection  100 mg Intravenous Daily   . sodium chloride 250 mL (07/27/17 0800)  . ertapenem Stopped (07/26/17 1350)   Anti-infectives (From admission, onward)   Start     Dose/Rate Route Frequency Ordered Stop   07/27/17 1000  fluconazole (DIFLUCAN) 40 MG/ML suspension 200 mg  Status:  Discontinued     200 mg Per Tube Daily 07/26/17 0925 07/26/17 1100  07/26/17 1000  fluconazole (DIFLUCAN) 40 MG/ML suspension 400 mg  Status:  Discontinued     400 mg Per Tube  Once 07/26/17 0924 07/26/17 1100   07/20/17 1400  ertapenem (INVANZ) 1 g in sodium chloride 0.9 % 50 mL IVPB     1 g 100 mL/hr over 30 Minutes Intravenous Every 24 hours 07/20/17 1209     07/16/17 0000  ceFAZolin (ANCEF) IVPB 2g/100 mL premix     2 g 200 mL/hr over 30 Minutes Intravenous Every 8 hours 07/15/17 1833 07/16/17 0054   07/15/17 0945  amoxicillin-clavulanate (AUGMENTIN) 250-62.5 MG/5ML suspension 500 mg     500 mg Oral Every 8 hours 07/15/17 0938 07/19/17 0928   07/15/17 0600  ceFAZolin (ANCEF) IVPB 2g/100 mL premix     2 g 200 mL/hr over 30 Minutes Intravenous On call to O.R. 07/14/17 1809 07/15/17 1632   07/11/17 1600  ampicillin-sulbactam (UNASYN)  1.5 g in sodium chloride 0.9 % 50 mL IVPB  Status:  Discontinued     1.5 g 100 mL/hr over 30 Minutes Intravenous Every 6 hours 07/11/17 1329 07/15/17 0940   07/08/17 1000  cefTRIAXone (ROCEPHIN) 1 g in dextrose 5 % 50 mL IVPB  Status:  Discontinued     1 g 100 mL/hr over 30 Minutes Intravenous Every 24 hours 07/08/17 0954 07/11/17 1315   07/08/17 1000  azithromycin (ZITHROMAX) 500 mg in dextrose 5 % 250 mL IVPB  Status:  Discontinued     500 mg 250 mL/hr over 60 Minutes Intravenous Every 24 hours 07/08/17 0954 07/11/17 1315      Assessment/Plan POD 12/6 j tube placement, redo j tube- George Stokes COPD oxygen dependent/ intubated 900 ml thoracentesis 07/26/17 - CCM Alcohol abuse Malnutrition  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 Dalbert Stokes 1/23 CT showed kink in small bowel at the level of jejunostomy causing SBO S/p ex-lap with revision of jejunostomy 1/23 Dr. Excell Stokes -continue tube feeds, can advance as tolerated, they are off now after extubation per ccm -ugi today to ensure patency of gi tract through j tube -when done in icu plan was to transfer back to [facility] -failed placement of NG - failed UGI attempt - GI to try EGD this week  ID -ertapenem 1/23 =>> day 8  Diflucan 1/29 >> day 2 FEN -NPO, IVF/Tube feeding at 30 ml, work towards goal 60 ml/hr. VTE -SCDs, SQ heparin Foley:  in Follow up -Dr. Dalbert Stokes  Plan:  We can work him up to goal on tube feeds, GI plans repeat EGD.  Stable pulmonary status, but with current obstruction he cannot get his secretions down.          LOS: 19 days    George Stokes 07/27/2017 [phone]

## 2017-07-27 NOTE — Progress Notes (Signed)
PULMONARY / CRITICAL CARE MEDICINE   Name: George Stokes MRN: [id number] DOB: [birth year]    ADMISSION DATE:  07/07/2017 CONSULTATION DATE:  07/20/2016  REFERRING MD:  Dr. Johna Sheriff  CHIEF COMPLAINT:  Post op vent dependence  HISTORY OF PRESENT ILLNESS:   72 year old male with PMH of HTN, COPD, Chronic hypoxia on home O2, and Etoh abuse, admitted to [facility] on 1/11 with complaints of nausea and vomiting since 12/23 failing outpatient treatment. At that time, also found to have AKI and CAP. Workup that admission included EGD, which demonstrated malignant esophageal stenosis. Biopsy revealed adenocarcinoma. J tube placed 1/18. Despite this, he continued to have nausea and vomiting, with worsening abdominal distension and found to have ascites leaking from around the J-tube. CT on 1/23 demonstrated high grade proximal small bowel obstruction. He was taken to the OR this evening for Ex-lap and revision of jejunostomy. Post operatively he remained on the vent and was transferred to the ICU.   CULTURES: 1/23 blood >>ng 1/23 tracheal aspirate >> candida 1/26 BAL: candida 50K  ANTIBIOTICS: Azithro 1/11 > 1/14 CTX 1/11 > 1/14 Unasyn 1/14 > 1/18 Cefazolin 1/18 >1/19 Augmentin 1/18 > 1/22 Ertapenem 1/23 >> Diflucan 1/29>>>  SIGNIFICANT EVENTS: 1/11  admit for nausea and vomiting 1/16  EGD demonstrated malignant esophageal stricture.  1/18  J tube placed 1/23  bowel obstruction, to OR for ex-lap, j'ostomy revised 1/26 am reintubated , bronchoscopy for left atelectasis  LINES/TUBES: J tube 1/18 > 1/23 >> ETT 1/23 >>1/24, 1/26 >>1/28 Foley catheter 1/23 >> OG tube 1/23 >>1/28 ngt 1/28>>>out   SUBJECTIVE:   No distress.   VITAL SIGNS: BP (!) 102/53 (BP Location: Left Arm)   Pulse 71   Temp 98.1 F (36.7 C) (Bladder)   Resp 20   Ht 5' 7.25" (1.708 m)   Wt 137 lb 9.1 oz (62.4 kg)   SpO2 95%   BMI 21.39 kg/m   VENTILATOR SETTINGS:    INTAKE / OUTPUT:  Intake/Output Summary (Last  24 hours) at 07/27/2017 1005 Last data filed at 07/27/2017 0800 Gross per 24 hour  Intake 1451.16 ml  Output 965 ml  Net 486.16 ml     PHYSICAL EXAMINATION: General: This is a frail-appearing 72 year old male currently resting in bed he is in no acute distress HEENT: Normocephalic atraumatic mucous membranes moist phonation quality improving daily but still having difficulty handling oral secretions Pulmonary: Some scattered rhonchi no accessory use decreased right base Cardiac: Irregular regular no murmur rub or gallop Abdomen: Soft, nontender, J-tube dressing intact. Extremities: Trace lower extremity edema, does have some skin tears on upper extremities.  Otherwise warm dry and intact. GU: Clear yellow Neuro: Awake oriented no focal deficits  LABS:  BMET Recent Labs  Lab 07/25/17 0333 07/26/17 0119 07/27/17 0307  NA 141 138 140  K 3.9 4.2 3.8  CL 108 103 105  CO2 28 28 32  BUN 11 10 14   CREATININE 0.67 0.61 0.78  GLUCOSE 113* 87 166*   Electrolytes Recent Labs  Lab 07/24/17 0321  07/25/17 0333 07/26/17 0119 07/27/17 0307  CALCIUM 8.2*   < > 7.9* 8.3* 8.0*  MG 1.9   < > 1.8 1.6* 2.4  PHOS 1.7*  --  1.9* 2.9  --    < > = values in this interval not displayed.   CBC Recent Labs  Lab 07/25/17 0333 07/26/17 0119 07/27/17 0307  WBC 15.5* 17.4* 14.0*  HGB 12.5* 12.8* 11.7*  HCT 38.0* 39.3 34.5*  PLT 254 305 299   Coag's No results for input(s): APTT, INR in the last 168 hours.  Sepsis Markers Recent Labs  Lab 07/20/17 1956 07/21/17 0318 07/22/17 0316 07/22/17 1538  LATICACIDVEN 2.3* 2.0*  --  1.0  PROCALCITON 0.43 0.36 0.22  --     ABG Recent Labs  Lab 07/22/17 0819 07/23/17 0215 07/23/17 0345  PHART 7.257* 7.206* 7.389  PCO2ART 76.3* 93.4* 51.1*  PO2ART 70.1* 61.6* 58.3*    Liver Enzymes Recent Labs  Lab 07/20/17 1956 07/26/17 0119 07/27/17 0307  AST 27 16 36  ALT 15* 15* 22  ALKPHOS 60 77 94  BILITOT 0.5 0.8 0.5  ALBUMIN 2.5*  1.8* 1.6*    Cardiac Enzymes Recent Labs  Lab 07/25/17 1344 07/25/17 1916 07/26/17 0119  TROPONINI <0.03 <0.03 <0.03    Glucose Recent Labs  Lab 07/20/17 2315 07/21/17 0310 07/21/17 0751 07/22/17 0749 07/22/17 0826 07/22/17 1131  GLUCAP 93 88 76 61* 86 80   Imaging Dg Chest Port 1 View  Result Date: 07/27/2017 CLINICAL DATA:  Atelectasis. EXAM: PORTABLE CHEST 1 VIEW COMPARISON:  Radiographs of July 26, 2016. FINDINGS: Stable cardiomediastinal silhouette. Stable right lower lobe opacity is noted concerning for pneumonia. No pneumothorax is noted. Mild left basilar subsegmental atelectasis is noted. Bony thorax is unremarkable. IMPRESSION: Stable right lower lobe pneumonia. Electronically Signed   By: Lupita Raider, M.D.   On: 07/27/2017 08:07   Dg Chest Port 1 View  Result Date: 07/26/2017 CLINICAL DATA:  Thoracentesis. EXAM: PORTABLE CHEST 1 VIEW COMPARISON:  One-view chest x-ray from the same day. FINDINGS: The heart size is normal. Interstitial and airspace disease at the right lung base is slightly improved. There is no pneumothorax. Mild left base airspace disease present. Emphysematous changes are noted. The NG tube was removed. IMPRESSION: 1. Persistent right lower lobe interstitial and airspace disease concerning for pneumonia. 2. Partial clearing of the right upper lobe. 3. Mild atelectasis left base. 4. COPD. 5. Interval removal of NG tube. Electronically Signed   By: Marin Roberts M.D.   On: 07/26/2017 14:03   Dg Kayleen Memos W/o Kub  Result Date: 07/26/2017 CLINICAL DATA:  72 year old male with esophageal cancer. Subsequent encounter. EXAM: UPPER GI SERIES WITHOUT KUB TECHNIQUE: Routine upper GI series was performed with thin barium. FLUOROSCOPY TIME:  Fluoroscopy Time:  1 minute and 48 seconds Radiation Exposure Index: 5.6 mGy COMPARISON:  07/26/2017 chest x-ray.  07/20/2017 CT. FINDINGS: Patient's recent nasogastric tube was removed by nursing staff prior to coming  to radiology. Request for placing nasogastric tube and performing upper GI series and small-bowel follow-through. Procedure discussed with patient. Lidocaine gel utilized. Nasogastric tube gently advanced. Not able to advance into the stomach. Barium instilled. Barium did not traverses into the stomach. Barium was aspirated and nasogastric tube removed. IMPRESSION: Unsuccessful placement of nasogastric tube. Not able to performed upper GI series and small-bowel follow-through as noted above. Results discussed with patient's nurse with request to call covering physician with results. Electronically Signed   By: Lacy Duverney M.D.   On: 07/26/2017 11:48   Reviewed by me STUDIES:  CT chest/abdomen 1/11 > Patchy areas of nodular airspace density primarily in the right middle lobe and right upper lobe. Bilateral renal hypodense lesions, incompletely characterized. Ultrasound may provide better evaluation. No hydronephrosis or obstructing stone. 1/12 US renal > Left renal cysts. 1/16 EGD >  Malignant appearing esophageal stenosis,  1/23 CT abd > High-grade proximal small bowel obstruction with transition at the  jejunostomy tube. There is a degree of closed loop physiology given the GE junction mass and severe stenosis. Dilated esophagus above the GE junction mass. There is shifting opacity at the right base concerning for recurrent aspiration. Small pleural effusions.Perfusion anomaly in the ventral right lobe liver without visible underlying cause. Attention on future staging scans.    DISCUSSION: 71yoM with HTN, COPD, Chronic hypoxia on home O2, and Etoh abuse, admitted to [facility] on 1/11 with N/V, AKI, and CAP, and found to have esophageal adenocarcinoma. J tube placed 1/18, complicated by continued N/V, worsening abdominal distension, and ascites leaking from around the J-tube. CT on 1/23 demonstrated high grade proximal small bowel obstruction. Revised  jejunostomy. Extubated 1/24 am but developed  Rt  atelectasis & reintubated 1/26  As of 1/30 S/p thora. Fluid exudate by Light's criteria. Most likely representing parapneumonic process. He looks a little stronger. Quiet day planned for today. EGD tomorrow.   ASSESSMENT / PLAN: Resolved issues: Drug-related hypotension Acute kidney injury  Current problem list:  Esophageal cancer (adenocarcinoma) with malignant esophageal stenosis Not able to swallow oral secretions.  Plan  NPO For f/u EGD 1/31 may attempt dilation (if safe) Will reach out to oncology again s/p EGD   Small bowel obstruction now status post exploratory laparotomy with J-tube revision last surgery on 1/23 Plan Cont tubefeeds per surg   Acute-on-Chronic Hypoxic Respiratory failure is setting of Aspiration pneumonia And Rt atelectasis (No bacterial orgs) Right parapneumonic effusion s/p thora 1/29 ~ 900 ml exudate  History of chronic obstructive pulmonary disease S/p  Bronch on 1/27 w/ improved aeration.   -Portable chest x-ray personally reviewed: R>L basilar airspace disease, no sig effusion  Plan F/u pleural fluid cultures and cytology Day 8 ertapenem; will await pleural fluid culture; if neg will stop Wean oxygen OOB Cont duoneb, flutter and IS NPO   Hx HTN Plan Cont bb   Irregular rhythm w/ negative trop I.  -seen by cards. Hemodynamically stable  Plan F/u echo Cont bb Cont tele Avoid hypomagnesemia and hypokalemia    Intermittent Fluid and electrolyte imbalance:  Plan Trend chem and replace as indicated  Anemia No evidence of bleeding Plan Cont [place] heparin  Trend cbc Transfuse per protocol    Hx Etoh Abuse, no evidence of withdrawal Plan Cont thiamine and folate  FAMILY  - Updates: Sister updated .  She would be surrogate HCPOA > he is not married / no children. She left for back home & brother should arrive today  - Inter-disciplinary family meet or Palliative Care meeting due by: 07/27/17  My critical care time 34 minutes    Simonne Martinet ACNP-BC [facility] Pulmonary/Critical Care Pager # [phone] OR # [phone] if no answer  07/27/2017, 10:05 AM

## 2017-07-27 NOTE — Progress Notes (Signed)
  Echocardiogram 2D Echocardiogram has been performed.  Merrie Roof F 07/27/2017, 10:04 AM

## 2017-07-28 ENCOUNTER — Ambulatory Visit
Admit: 2017-07-28 | Discharge: 2017-07-28 | Disposition: A | Discharge status: Home / Self Care | Payer: Medicare Other | Attending: Radiation Oncology | Admitting: Radiation Oncology

## 2017-07-28 DIAGNOSIS — Z934 Other artificial openings of gastrointestinal tract status: Secondary | ICD-10-CM

## 2017-07-28 DIAGNOSIS — C155 Malignant neoplasm of lower third of esophagus: Secondary | ICD-10-CM

## 2017-07-28 DIAGNOSIS — C160 Malignant neoplasm of cardia: Secondary | ICD-10-CM | POA: Diagnosis not present

## 2017-07-28 LAB — CBC
HEMATOCRIT: 34.6 % — AB (ref 39.0–52.0)
HEMOGLOBIN: 11.3 g/dL — AB (ref 13.0–17.0)
MCH: 29.1 pg (ref 26.0–34.0)
MCHC: 32.7 g/dL (ref 30.0–36.0)
MCV: 89.2 fL (ref 78.0–100.0)
Platelets: 236 10*3/uL (ref 150–400)
RBC: 3.88 MIL/uL — ABNORMAL LOW (ref 4.22–5.81)
RDW: 16.3 % — ABNORMAL HIGH (ref 11.5–15.5)
WBC: 13.5 10*3/uL — ABNORMAL HIGH (ref 4.0–10.5)

## 2017-07-28 LAB — COMPREHENSIVE METABOLIC PANEL
ALBUMIN: 1.7 g/dL — AB (ref 3.5–5.0)
ALT: 33 U/L (ref 17–63)
ANION GAP: 4 — AB (ref 5–15)
AST: 41 U/L (ref 15–41)
Alkaline Phosphatase: 99 U/L (ref 38–126)
BILIRUBIN TOTAL: 0.3 mg/dL (ref 0.3–1.2)
BUN: 13 mg/dL (ref 6–20)
CHLORIDE: 103 mmol/L (ref 101–111)
CO2: 33 mmol/L — AB (ref 22–32)
Calcium: 7.7 mg/dL — ABNORMAL LOW (ref 8.9–10.3)
Creatinine, Ser: 0.71 mg/dL (ref 0.61–1.24)
GFR calc Af Amer: >60 mL/min (ref >60)
GFR calc non Af Amer: >60 mL/min (ref >60)
GLUCOSE: 107 mg/dL — AB (ref 65–99)
POTASSIUM: 3.8 mmol/L (ref 3.5–5.1)
SODIUM: 140 mmol/L (ref 135–145)
TOTAL PROTEIN: 4.6 g/dL — AB (ref 6.5–8.1)

## 2017-07-28 LAB — RHEUMATOID FACTORS, FLUID: RHEUMATOID ARTHRITIS, QN/FLUID: NEGATIVE

## 2017-07-28 LAB — CHOLESTEROL, BODY FLUID: CHOL FL: 20 mg/dL

## 2017-07-28 MED ORDER — FLUCONAZOLE 100MG IVPB: 100.0000 mg | INTRAVENOUS | Status: DC

## 2017-07-28 MED ORDER — OSMOLITE 1.5 CAL PO LIQD
1000.0000 mL | ORAL | Status: DC
  Administered 2017-07-29 – 2017-07-31 (×4): 1000 mL
  Filled 2017-07-28 (×9): qty 1000

## 2017-07-28 MED ORDER — FLUCONAZOLE 100MG IVPB
100.0000 mg | INTRAVENOUS | Status: DC
  Administered 2017-07-29 – 2017-08-02 (×5): 100 mg via INTRAVENOUS
  Filled 2017-07-28 (×6): qty 50

## 2017-07-28 MED ORDER — FLUCONAZOLE IN SODIUM CHLORIDE 200-0.9 MG/100ML-% IV SOLN
200.0000 mg | Freq: Once | INTRAVENOUS | Status: AC
  Administered 2017-07-28: 200 mg via INTRAVENOUS
  Filled 2017-07-28: qty 100

## 2017-07-28 NOTE — Progress Notes (Signed)
8 Days Post-Op   Subjective/Chief Complaint: No issues tube feeds at 42, very anxious today, wants to start treatment   Objective: Vital signs in last 24 hours: Temp:  [98.1 F (36.7 C)-99.7 F (37.6 C)] 99.1 F (37.3 C) (01/31 0600) Resp:  [15-24] 21 (01/31 0600) BP: (91-117)/(40-87) 110/54 (01/31 0600) SpO2:  [89 %-97 %] 92 % (01/31 0600) Weight:  [61.6 kg (135 lb 12.9 oz)] 61.6 kg (135 lb 12.9 oz) (01/31 0500) Last BM Date: 07/26/17  Intake/Output from previous day: 01/30 0701 - 01/31 0700 In: 841.3 [I.V.:503.5; NG/GT:287.8; IV Piggyback:50] Out: 580 [Urine:580] Intake/Output this shift: No intake/output data recorded.  GI: soft bs present j tube site clean incision clean without infection  Lab Results:  Recent Labs    07/27/17 0307 07/28/17 0318  WBC 14.0* 13.5*  HGB 11.7* 11.3*  HCT 34.5* 34.6*  PLT 299 236   BMET Recent Labs    07/27/17 0307 07/28/17 0318  NA 140 140  K 3.8 3.8  CL 105 103  CO2 32 33*  GLUCOSE 166* 107*  BUN 14 13  CREATININE 0.78 0.71  CALCIUM 8.0* 7.7*   PT/INR No results for input(s): LABPROT, INR in the last 72 hours. ABG No results for input(s): PHART, HCO3 in the last 72 hours.  Invalid input(s): PCO2, PO2  Studies/Results: Dg Abd 1 View  Result Date: 07/26/2017 CLINICAL DATA:  NG tube advancement EXAM: ABDOMEN - 1 VIEW COMPARISON:  07/25/2017 FINDINGS: NG tube looped in the distal esophagus. Right lower lobe opacity with small right pleural effusion. Skin staples overlying the upper abdomen. IMPRESSION: NG tube looped in the distal esophagus. These results will be called to the ordering clinician or representative by the Radiologist Assistant, and communication documented in the PACS or zVision Dashboard. Electronically Signed   By: Julian Hy M.D.   On: 07/26/2017 09:32   Dg Chest Port 1 View  Result Date: 07/27/2017 CLINICAL DATA:  Atelectasis. EXAM: PORTABLE CHEST 1 VIEW COMPARISON:  Radiographs of July 26, 2016. FINDINGS: Stable cardiomediastinal silhouette. Stable right lower lobe opacity is noted concerning for pneumonia. No pneumothorax is noted. Mild left basilar subsegmental atelectasis is noted. Bony thorax is unremarkable. IMPRESSION: Stable right lower lobe pneumonia. Electronically Signed   By: Marijo Conception, M.D.   On: 07/27/2017 08:07   Dg Chest Port 1 View  Result Date: 07/26/2017 CLINICAL DATA:  Thoracentesis. EXAM: PORTABLE CHEST 1 VIEW COMPARISON:  One-view chest x-ray from the same day. FINDINGS: The heart size is normal. Interstitial and airspace disease at the right lung base is slightly improved. There is no pneumothorax. Mild left base airspace disease present. Emphysematous changes are noted. The NG tube was removed. IMPRESSION: 1. Persistent right lower lobe interstitial and airspace disease concerning for pneumonia. 2. Partial clearing of the right upper lobe. 3. Mild atelectasis left base. 4. COPD. 5. Interval removal of NG tube. Electronically Signed   By: San Morelle M.D.   On: 07/26/2017 14:03   Dg Abd Portable 1v  Result Date: 07/27/2017 CLINICAL DATA:  Gastroesophageal ca, f/u feeding tube placement, some fullness feeling in stomach EXAM: PORTABLE ABDOMEN - 1 VIEW COMPARISON:  Radiograph 129 19 FINDINGS: Enteric tube projects over the RIGHT lower quadrant. No dilated large or small bowel. Small amount of high-density stool remains in rectum IMPRESSION: No bowel obstruction Electronically Signed   By: Suzy Bouchard M.D.   On: 07/27/2017 10:13   Dg Duanne Limerick W/o Kub  Result Date: 07/26/2017 CLINICAL DATA:  72 year old male with esophageal cancer. Subsequent encounter. EXAM: UPPER GI SERIES WITHOUT KUB TECHNIQUE: Routine upper GI series was performed with thin barium. FLUOROSCOPY TIME:  Fluoroscopy Time:  1 minute and 48 seconds Radiation Exposure Index: 5.6 mGy COMPARISON:  07/26/2017 chest x-ray.  07/20/2017 CT. FINDINGS: Patient's recent nasogastric tube was removed by  nursing staff prior to coming to radiology. Request for placing nasogastric tube and performing upper GI series and small-bowel follow-through. Procedure discussed with patient. Lidocaine gel utilized. Nasogastric tube gently advanced. Not able to advance into the stomach. Barium instilled. Barium did not traverses into the stomach. Barium was aspirated and nasogastric tube removed. IMPRESSION: Unsuccessful placement of nasogastric tube. Not able to performed upper GI series and small-bowel follow-through as noted above. Results discussed with patient's nurse with request to call covering physician with results. Electronically Signed   By: Genia Del M.D.   On: 07/26/2017 11:48    Anti-infectives: Anti-infectives (From admission, onward)   Start     Dose/Rate Route Frequency Ordered Stop   07/27/17 1000  fluconazole (DIFLUCAN) 40 MG/ML suspension 200 mg  Status:  Discontinued     200 mg Per Tube Daily 07/26/17 0925 07/26/17 1100   07/26/17 1000  fluconazole (DIFLUCAN) 40 MG/ML suspension 400 mg  Status:  Discontinued     400 mg Per Tube  Once 07/26/17 0924 07/26/17 1100   07/20/17 1400  ertapenem (INVANZ) 1 g in sodium chloride 0.9 % 50 mL IVPB     1 g 100 mL/hr over 30 Minutes Intravenous Every 24 hours 07/20/17 1209     07/16/17 0000  ceFAZolin (ANCEF) IVPB 2g/100 mL premix     2 g 200 mL/hr over 30 Minutes Intravenous Every 8 hours 07/15/17 1833 07/16/17 0054   07/15/17 0945  amoxicillin-clavulanate (AUGMENTIN) 250-62.5 MG/5ML suspension 500 mg     500 mg Oral Every 8 hours 07/15/17 0938 07/19/17 0928   07/15/17 0600  ceFAZolin (ANCEF) IVPB 2g/100 mL premix     2 g 200 mL/hr over 30 Minutes Intravenous On call to O.R. 07/14/17 1809 07/15/17 1632   07/11/17 1600  ampicillin-sulbactam (UNASYN) 1.5 g in sodium chloride 0.9 % 50 mL IVPB  Status:  Discontinued     1.5 g 100 mL/hr over 30 Minutes Intravenous Every 6 hours 07/11/17 1329 07/15/17 0940   07/08/17 1000  cefTRIAXone (ROCEPHIN) 1 g  in dextrose 5 % 50 mL IVPB  Status:  Discontinued     1 g 100 mL/hr over 30 Minutes Intravenous Every 24 hours 07/08/17 0954 07/11/17 1315   07/08/17 1000  azithromycin (ZITHROMAX) 500 mg in dextrose 5 % 250 mL IVPB  Status:  Discontinued     500 mg 250 mL/hr over 60 Minutes Intravenous Every 24 hours 07/08/17 0954 07/11/17 1315      Assessment/Plan: POD 13/7 j tube placement/revision COPD S/p thoracentesis etoh abuse malnutrition   -advance tube feeds to goal today -will remove staples 10 days postop early next week -agree with starting therapy with oncology -plan transfer to medical service today for further therapy.   Rolm Bookbinder 07/28/2017

## 2017-07-28 NOTE — Progress Notes (Signed)
IP PROGRESS NOTE  Subjective:   George Stokes underwent placement of a jejunostomy feeding tube on 07/15/2017.  He developed an obstruction at the site of the feeding tube 07/20/2017 and was taken to the operating room for revision.  Following this procedure he had prolonged intubation and was extubated 07/25/2017. He is alert this morning.  Tube feedings have been advanced.  No nausea.  He is concerned that he remains in the hospital and has not started treatment for the esophagus cancer. He underwent a right thoracentesis 07/26/2017.  The cytology revealed acute inflammation.  Dr. Ardis Hughs evaluated him to consider a repeat endoscopy. I discussed the case with Dr. Ardis Hughs.  He decided against a repeat endoscopy given the risk associated with this procedure and the high likelihood of  a diagnosis of esophagus cancer. Objective: Vital signs in last 24 hours: Blood pressure (!) 112/57, pulse 71, temperature 99.3 F (37.4 C), resp. rate 16, height 5' 7.25" (1.708 m), weight 135 lb 12.9 oz (61.6 kg), SpO2 96 %.  Intake/Output from previous day: 01/30 0701 - 01/31 0700 In: 841.3 [I.V.:503.5; NG/GT:287.8; IV Piggyback:50] Out: 44 [Urine:580]  Physical Exam: Lungs: Distant breath sounds, moves air bilaterally, no respiratory distress Cardiac: Regular rate and rhythm Abdomen: Healing midline incision, left abdomen feeding tube site without evidence of infection, the abdomen is soft Extremities: No leg edema   Portacath/PICC-without erythema  Lab Results: Recent Labs    07/27/17 0307 07/28/17 0318  WBC 14.0* 13.5*  HGB 11.7* 11.3*  HCT 34.5* 34.6*  PLT 299 236    BMET Recent Labs    07/27/17 0307 07/28/17 0318  NA 140 140  K 3.8 3.8  CL 105 103  CO2 32 33*  GLUCOSE 166* 107*  BUN 14 13  CREATININE 0.78 0.71  CALCIUM 8.0* 7.7*    Lab Results  Component Value Date   CEA1 2.8 07/15/2017    Studies/Results: Dg Chest Port 1 View  Result Date: 07/27/2017 CLINICAL DATA:   Atelectasis. EXAM: PORTABLE CHEST 1 VIEW COMPARISON:  Radiographs of July 26, 2016. FINDINGS: Stable cardiomediastinal silhouette. Stable right lower lobe opacity is noted concerning for pneumonia. No pneumothorax is noted. Mild left basilar subsegmental atelectasis is noted. Bony thorax is unremarkable. IMPRESSION: Stable right lower lobe pneumonia. Electronically Signed   By: Marijo Conception, M.D.   On: 07/27/2017 08:07   Dg Abd Portable 1v  Result Date: 07/27/2017 CLINICAL DATA:  Gastroesophageal ca, f/u feeding tube placement, some fullness feeling in stomach EXAM: PORTABLE ABDOMEN - 1 VIEW COMPARISON:  Radiograph 129 19 FINDINGS: Enteric tube projects over the RIGHT lower quadrant. No dilated large or small bowel. Small amount of high-density stool remains in rectum IMPRESSION: No bowel obstruction Electronically Signed   By: Suzy Bouchard M.D.   On: 07/27/2017 10:13    Medications: I have reviewed the patient's current medications.  Assessment/Plan: 1.  Esophagus cancer  Distal esophageal stricture noted on esophagram 07/11/2017  CTs of the chest, abdomen, and pelvis 07/08/2017-right lung pneumonia, emphysema, no evidence of metastatic disease  Upper endoscopy 07/13/2017- GE junction mass, could not be passed with standard endoscope, biopsy suspicious for adenocarcinoma  Right pleural fluid cytology 07/26/2017-negative  2.   Solid/liquid dysphagia secondary to #1  Placement of jejunostomy feeding tube 07/15/2017, revised 07/20/2017  3.   Weight loss/malnutrition, maintained on jejunostomy tube feedings  4.   COPD  5.   Heavy alcohol use  6.   Gout  7.   Hypertension  8.  Pneumonia  George Stokes is recovering from an episode of respiratory failure following revision of a jejunostomy feeding tube.  He appears to have localized esophagus cancer.  He understands the biopsy 07/13/2017 did not yield a definitive diagnosis of invasive adenocarcinoma.  The clinical presentation  is highly suggestive of adenocarcinoma of the esophagus.  We discussed the risk/benefit of a repeat endoscopy for biopsy.  He agrees to proceed with treatment for esophagus cancer and forego another biopsy procedure.  I discussed the case with radiation oncology.  He will undergo radiation simulation today.  The plan is to begin concurrent weekly Taxol/carboplatin and radiation on 08/01/2017.  We will begin treatment as an inpatient if he remains in the hospital.  I will return to tomorrow to discuss details of the Taxol/carboplatin regimen with Mr. Hockey.  Outpatient follow-up will be arranged at the Cancer center.     LOS: 20 days   Betsy Coder, MD   07/28/2017, 8:01 PM

## 2017-07-28 NOTE — Progress Notes (Signed)
Physical Therapy Treatment Patient Details Name: George Stokes MRN: [id number] DOB: [birth year] Today's Date: 07/28/2017    History of Present Illness 72 y.o. male with medical history significant of COPD, CRF on 4 liters he thinks at home, htn comes in with over a week of nausea and vomiting.  EGD performed 07/13/17 found malignant appearing esophageal stenosis - biopsy sent.   Dx of PNA    PT Comments    Pt was much more agreeable to working with PT today, reports feeling better. Pt able to ambulate in hall with 4L SpO2 [place] (did not drop below 88%), required 2 minimal breaks to catch breath (no more than 30s needed). Observed pt tendency to drift to left while ambulating, may be due to weakness/difficulty steering RW with UE. Pt reported not realizing he had deconditioned so much during hospital stay and would be willing to work with PT more often. 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     Follow Up Recommendations  SNF;Supervision/Assistance - 24 hour     Equipment Recommendations  Rolling walker with 5" wheels;3in1 (PT)    Recommendations for Other Services       Precautions / Restrictions Precautions Precautions: Fall Precaution Comments: feeding tube Restrictions Weight Bearing Restrictions: No    Mobility  Bed Mobility Overal bed mobility: Needs Assistance Bed Mobility: Supine to Sit     Supine to sit: Min guard     General bed mobility comments: assist with blankets and safety with O2 line and IV line, min/guard for safety, cues for UE placement, moving around lines  Transfers Overall transfer level: Needs assistance Equipment used: Rolling walker (2 wheeled) Transfers: Sit to/from Stand Sit to Stand: Min assist         General transfer comment: steadying assist throughout, right side lean  Ambulation/Gait Ambulation/Gait assistance: Min assist;+2 safety/equipment Ambulation Distance (Feet): 60 Feet Assistive  device: Rolling walker (2 wheeled) Gait Pattern/deviations: Step-through pattern;Decreased stride length;Drifts right/left;Trunk flexed     General Gait Details: assist with lines/equipment; cues for RW position, posture, breathing; multimodal cues for posturing/difficulty shifting walker particulary towards R side (due to weakness)   Stairs            Wheelchair Mobility    Modified Rankin (Stroke Patients Only)       Balance Overall balance assessment: Needs assistance         Standing balance support: Bilateral upper extremity supported Standing balance-Leahy Scale: Poor                              Cognition Arousal/Alertness: Awake/alert Behavior During Therapy: WFL for tasks assessed/performed Overall Cognitive Status: Within Functional Limits for tasks assessed                                        Exercises      General Comments        Pertinent Vitals/Pain Pain Assessment: No/denies pain    Home Living                      Prior Function            PT Goals (current goals can now be found in the care plan section) Acute Rehab PT Goals Patient Stated Goal: to regain independence and strength PT Goal Formulation:  With patient Time For Goal Achievement: 08/11/17 Potential to Achieve Goals: Fair Progress towards PT goals: Progressing toward goals    Frequency    Min 3X/week      PT Plan Discharge plan needs to be updated;Equipment recommendations need to be updated    Co-evaluation              AM-PAC PT "6 Clicks" Daily Activity  Outcome Measure  Difficulty turning over in bed (including adjusting bedclothes, sheets and blankets)?: A Little Difficulty moving from lying on back to sitting on the side of the bed? : A Lot Difficulty sitting down on and standing up from a chair with arms (e.g., wheelchair, bedside commode, etc,.)?: Unable Help needed moving to and from a bed to chair  (including a wheelchair)?: A Little Help needed walking in hospital room?: A Little Help needed climbing 3-5 steps with a railing? : A Lot 6 Click Score: 14    End of Session Equipment Utilized During Treatment: Gait belt;Oxygen Activity Tolerance: Patient tolerated treatment well Patient left: in chair;with call bell/phone within reach;with family/visitor present Nurse Communication: Mobility status PT Visit Diagnosis: Muscle weakness (generalized) (M62.81);Difficulty in walking, not elsewhere classified (R26.2)     Time: 3086-5784 PT Time Calculation (min) (ACUTE ONLY): 29 min  Charges:  $Gait Training: 8-22 mins $Therapeutic Activity: 8-22 mins                    G Codes:      George Stokes, SPT  George Margrit Minner 07/28/2017, 12:32 PM

## 2017-07-28 NOTE — NC FL2 (Signed)
[place] MEDICAID FL2 LEVEL OF CARE SCREENING TOOL     IDENTIFICATION  Patient Name: George Stokes Birthdate: 01-11-1946 Sex: male Admission Date (Current Location): 07/07/2017  [facility] and [place] Number:  Producer, television/film/video and Address:  [facility],  [address], [place] [address]      Provider Number: [id number]  Attending Physician Name and Address:  Shon Hale, MD  Relative Name and Phone Number:  [place], [place] [phone]    Current Level of Care:   Recommended Level of Care: Skilled Nursing Facility Prior Approval Number:    Date Approved/Denied:   PASRR Number: [id number]  Discharge Plan: SNF    Current Diagnoses: Patient Active Problem List   Diagnosis Date Noted  . Atelectasis of right lung   . H/O exploratory laparotomy 07/20/2017  . Acute respiratory failure (HCC) 07/20/2017  . Malnutrition of moderate degree 07/16/2017  . Esophageal cancer (HCC) 07/15/2017  . Adenocarcinoma of esophagus (HCC)   . Esophageal dysphagia   . Esophageal mass   . Esophageal stricture   . Protein-calorie malnutrition, severe (HCC) 07/12/2017  . Hypomagnesemia   . Hypokalemia   . Lobar pneumonia (HCC)   . Intractable nausea and vomiting 07/08/2017  . CAP (community acquired pneumonia) 07/08/2017  . Acute kidney injury (HCC)   . Intractable vomiting with nausea 06/29/2017  . Sleep disturbance 01/04/2017  . Idiopathic chronic gout of multiple sites without tophus 01/04/2017  . Chronic respiratory failure with hypoxia (HCC) 01/13/2016  . COPD with hypoxia (HCC) 05/03/2014  . Gout 10/30/2011  . Essential hypertension 10/30/2011  . Dyslipidemia 10/30/2011    Orientation RESPIRATION BLADDER Height & Weight     Self, Time, Situation, Place  Normal Continent Weight: 135 lb 12.9 oz (61.6 kg) Height:  5' 7.25" (170.8 cm)  BEHAVIORAL SYMPTOMS/MOOD NEUROLOGICAL BOWEL NUTRITION STATUS      Continent Diet(See discharge summary )  AMBULATORY STATUS  COMMUNICATION OF NEEDS Skin   Extensive Assist Verbally (Pressure Injury: tissue injury/ Foam Clear/Dry/Intact )                       Personal Care Assistance Level of Assistance  Feeding, Bathing, Dressing Bathing Assistance: Limited assistance Feeding assistance: Independent Dressing Assistance: Limited assistance     Functional Limitations Info  Sight, Hearing, Speech Sight Info: Impaired Hearing Info: Impaired Speech Info: Adequate    SPECIAL CARE FACTORS FREQUENCY  PT (By licensed PT), OT (By licensed OT)     PT Frequency: 5x/week OT Frequency: 5x/week            Contractures Contractures Info: Not present    Additional Factors Info  Code Status, Allergies Code Status Info: Fullcode  Allergies Info: Allergies: No Known Allergies           Current Medications (07/28/2017):  This is the current hospital active medication list Current Facility-Administered Medications  Medication Dose Route Frequency Provider Last Rate Last Dose  . 0.9 %  sodium chloride infusion  250 mL Intravenous PRN Hammonds, Curt Jews, MD 20 mL/hr at 07/27/17 1600 250 mL at 07/27/17 1600  . acetaminophen (TYLENOL) tablet 650 mg  650 mg Oral Q4H PRN Hammonds, Curt Jews, MD      . chlorhexidine gluconate (MEDLINE KIT) (PERIDEX) 0.12 % solution 15 mL  15 mL Mouth Rinse BID Deterding, Dorise Hiss, MD   15 mL at 07/28/17 0900  . Chlorhexidine Gluconate Cloth 2 % PADS 6 each  6 each Topical Daily Kalman Shan, MD  6 each at 07/27/17 2208  . ertapenem (INVANZ) 1 g in sodium chloride 0.9 % 50 mL IVPB  1 g Intravenous Q24H Meuth, Brooke A, PA-C   Stopped at 07/27/17 1456  . feeding supplement (OSMOLITE 1.5 CAL) liquid 1,000 mL  1,000 mL Per Tube Q24H Emelia Loron, MD 50 mL/hr at 07/28/17 0906 1,000 mL at 07/28/17 0906  . fentaNYL (SUBLIMAZE) injection 50 mcg  50 mcg Intravenous Q2H PRN Deterding, Dorise Hiss, MD   50 mcg at 07/24/17 2339  . [START ON 07/29/2017] fluconazole (DIFLUCAN)  IVPB 100 mg  100 mg Intravenous Q24H Emokpae, Courage, MD      . folic acid injection 1 mg  1 mg Intravenous Daily Hammonds, Curt Jews, MD   1 mg at 07/27/17 1013  . free water 50 mL  50 mL Per Tube Q4H Glenna Fellows, MD   50 mL at 07/28/17 0900  . heparin injection 5,000 Units  5,000 Units Subcutaneous Q8H Hammonds, Curt Jews, MD   5,000 Units at 07/28/17 0504  . hydrALAZINE (APRESOLINE) injection 10 mg  10 mg Intravenous Q6H PRN Rayburn, Kelly A, PA-C      . ipratropium-albuterol (DUONEB) 0.5-2.5 (3) MG/3ML nebulizer solution 3 mL  3 mL Nebulization QID Simonne Martinet, NP   3 mL at 07/28/17 1115  . MEDLINE mouth rinse  15 mL Mouth Rinse QID Deterding, Dorise Hiss, MD   15 mL at 07/28/17 0504  . metoprolol tartrate (LOPRESSOR) 25 mg/10 mL oral suspension 25 mg  25 mg Per Tube BID Simonne Martinet, NP   25 mg at 07/28/17 1053  . metoprolol tartrate (LOPRESSOR) injection 5 mg  5 mg Intravenous Q8H PRN Salli Quarry, Amrit, MD      . multivitamin liquid 15 mL  15 mL Oral Daily Marinda Elk, MD   15 mL at 07/28/17 1053  . neomycin-bacitracin-polymyxin (NEOSPORIN) ointment   Topical Once Adhikari Bk, Amrit, MD      . ondansetron (ZOFRAN-ODT) disintegrating tablet 4 mg  4 mg Oral Q6H PRN Claud Kelp, MD       Or  . ondansetron [facility]) injection 4 mg  4 mg Intravenous Q6H PRN Claud Kelp, MD   4 mg at 07/20/17 1347  . pantoprazole (PROTONIX) injection 40 mg  40 mg Intravenous Q12H Albertine Grates, MD   40 mg at 07/28/17 1054  . sodium chloride flush (NS) 0.9 % injection 10-40 mL  10-40 mL Intracatheter PRN Karl Ito, MD      . thiamine (B-1) injection 100 mg  100 mg Intravenous Daily Hammonds, Curt Jews, MD   100 mg at 07/28/17 1053     Discharge Medications: Please see discharge summary for a list of discharge medications.  Relevant Imaging Results:  Relevant Lab Results:   Additional Information [facility]:[phone]/ Radiation Treatments starting 1/31-3/7 Daily [facility]  [place], [place]

## 2017-07-28 NOTE — Progress Notes (Signed)
Nutrition Follow-up  DOCUMENTATION CODES:   Non-severe (moderate) malnutrition in context of chronic illness  INTERVENTION:  - Will advance TF to goal per Dr. Donne Hazel, Osmolite 1.5 @ 60 mL/hr via J-tube which will provides 2160 kcal, 90 grams of protein, and 1097 mL free water. - Continue 50 mL free water every 4 hours via J-tube (300 mL/day). - Will monitor weight trends and for signs of weakness as treatment starts and will adjust estimated nutrition needs/TF regimen as needed.  NUTRITION DIAGNOSIS:   Moderate Malnutrition related to chronic illness, nausea, vomiting(ETOH abuse) as evidenced by percent weight loss, energy intake < or equal to 75% for > or equal to 1 month, moderate fat depletion, moderate muscle depletion. -ongoing  GOAL:   Patient will meet greater than or equal to 90% of their needs -will be met with TF at goal   MONITOR:   TF tolerance, Weight trends, Labs, I & O's  ASSESSMENT:   72 y.o. male with medical history significant of COPD, CRF on 4 liters he thinks at home, htn comes in with over a week of nausea and vomiting with no abdominal pain or blood in vomit.  No fevers.  No diarrhea. No cough.  No chest pain.  He says he gets nauseated even after drinking liquids.  Pt referred for admission for report of wt loss and his n/v.  SignificantEvents: 1/13: Pt reports poor intakes for 10 days PTA.  1/14:Esophageal stricturenoted 1/16: EGD reveals malignant esophageal mass 1/17: biopsies confirm adenocarcinoma 1/18: J-tube placed 1/19: TF initiated, Osmolite 1.5 @ 20 ml/hr 1/20: TF stopped after episodes of N/V, Osmolite1.5 restarted at 30 ml/hr, then stopped again. 1/23: ex lap and revision of J-tube d/t CT showing kink in small bowel causing SBO 1/24: extubated in AM, TF held 1/26: re-intubation  1/28: extubated in AM; OGT removed and NGT placed 1/29 NGT removed, TF re-started 1/31: TF to reach goal rate today    Weight -2 lbs/0.8 kg from  yesterday; will monitor weight trends closely. Pt with J-tube and Dr. Cristal Generous note states okay to advance to goal rate today. Will monitor for initiation of treatment for esophageal cancer.   Medications reviewed; 1 mg IV folic acid/day, 15 mL liquid multivitamin/day, 40 mg IV Protonix BID, 100 mg IV thiamine/day.  Labs reviewed; Ca: 7.7 mg/dL.      Diet Order:  Diet NPO time specified  EDUCATION NEEDS:   Not appropriate for education at this time  Skin:  Skin Assessment: Skin Integrity Issues: Skin Integrity Issues:: DTI DTI: sacrum Incisions: abdominal on 1/23  Last BM:  1/29  Height:   Ht Readings from Last 1 Encounters:  07/20/17 5' 7.25" (1.708 m)    Weight:   Wt Readings from Last 1 Encounters:  07/28/17 135 lb 12.9 oz (61.6 kg)    Ideal Body Weight:  67.3 kg  BMI:  Body mass index is 21.11 kg/m.  Estimated Nutritional Needs:   Kcal:  1970-2150 (33-36 kcal/kg)  Protein:  83-100 grams (1.4-1.7 grams/kg)  Fluid:  2L/day      George Matin, MS, RD, LDN, CNSC Inpatient Clinical Dietitian Pager # [phone] After hours/weekend pager # [phone]

## 2017-07-28 NOTE — Progress Notes (Signed)
Per MD on bedside increase tube feeding to 50ml/hr and need to meet the goal 29ml/hr this afternoon. RN changed the rate and let the Days[facility] RN informed.

## 2017-07-28 NOTE — Clinical Social Work Note (Addendum)
Clinical Social Work Assessment  Patient Details  Name: George Stokes MRN: [id number] Date of Birth: Feb 20, 1946  Date of referral:  07/28/17               Reason for consult:  Facility Placement                Permission sought to share information with:  Facility Sport and exercise psychologist Permission granted to share information::     Name::      George Stokes  Agency::   SNF   Relationship::   Brother/POA  Contact Information:   [phone]  Housing/Transportation Living arrangements for the past 2 months:  Apartment Source of Information:  Patient Patient Interpreter Needed:  None Criminal Activity/Legal Involvement Pertinent to Current Situation/Hospitalization:  No - Comment as needed Significant Relationships:  Other Family Members, Siblings Lives with:  Self Do you feel safe going back to the place where you live?  No Need for family participation in patient care:  Yes  Care giving concerns:   Patient evaluated by physical therapy recommending SNF placement.   Transportation to and from radiation treatments.   Social Worker assessment / plan:  CSW met with patient at beside, explained role and reason for visit-to assist with discharge to SNF. Patient brother in law present at bedside. He reports the patient brother [name] is POA, lives in [place] and plans to travel this weekend to be with the patient.   The patient and family are agreeable to SNF placement for rehab to gain strength. CSW explain SNF process and later following up with bed offers.   The patient is scheduled to start radiation treatments 1/31 and will continue for the next several weeks. The patient will need transportation to appointment because family will not be able to provide transport. CSW explain there a few facilities that will agree to transport the patient daily. The family understands they may have to pay for private transportation if needed.  Patient brothers report understanding.   Plan: Assist with  discharge to SNF.   Employment status:    Insurance information:  Medicare PT Recommendations:  [place] / Referral to community resources:  [place]  Patient/Family's Response to care:  Patient and family agreeable to SNF process and plan. All appreciative of CSW services to assist with discharge planning.   Patient/Family's Understanding of and Emotional Response to Diagnosis, Current Treatment, and Prognosis:  Patient family knowledgeable of patient diagnosis and proactive in searching for SNF and discussing transportation services for patient follow up care.    Emotional Assessment Appearance:  Appears stated age Attitude/Demeanor/Rapport:    Affect (typically observed):  Accepting Orientation:  Oriented to Self, Oriented to Place, Oriented to  Time, Oriented to Situation Alcohol / Substance use:  Not Applicable Psych involvement (Current and /or in the community):  No (Comment)  Discharge Needs  Concerns to be addressed:  Discharge Planning Concerns Readmission within the last 30 days:  No Current discharge risk:  Dependent with Mobility, Physical Impairment Barriers to Discharge:  No Barriers Identified   Lia Hopping, LCSW 07/28/2017, 1:33 PM

## 2017-07-28 NOTE — Progress Notes (Addendum)
Patient Demographics:    George Stokes, is a 72 y.o. male, DOB - Dec 13, 1945, BJY:[id number]  Admit date - 07/07/2017   Admitting Physician Hillary Bow, DO  Outpatient Primary MD for the patient is Patient, No Pcp Per  LOS - 20   Chief Complaint  Patient presents with  . Emesis        Subjective:    George Stokes today has no fevers, no emesis,  No chest pain, brother-in-law at bedside, complains of fatigue  Assessment  & Plan :    Principal Problem:   Adenocarcinoma of esophagus (HCC) Active Problems:   Essential hypertension   COPD with hypoxia (HCC)   Chronic respiratory failure with hypoxia (HCC)   Intractable nausea and vomiting   CAP (community acquired pneumonia)   Lobar pneumonia (HCC)   Hypomagnesemia   Hypokalemia   Protein-calorie malnutrition, severe (HCC)   Esophageal dysphagia   Esophageal mass   Esophageal stricture   Esophageal cancer (HCC)   Malnutrition of moderate degree   H/O exploratory laparotomy   Acute respiratory failure (HCC)   Atelectasis of right lung  Brief Summary:- 72 y.o. male reformed smoker with COPD, Chronic Hypoxic Resp Failure  on 4 liters , HTN and history of alcohol abuse admitted on 07/08/17 with N/V and a 20 pound weight loss.  S/P J tube placement, with complicated course due to SBO and leakage out of J tube. S/P Ex-Lap and jejunostomy revision. EGD performed 07/13/17 found malignant appearing esophageal stenosis/ annular adenocarcinoma, awaiting radiation and chemotherapy   Plan:- 1)GE junction malignant esophageal stricture/ annular adenocarcinoma- Dr Christella Hartigan Sandria Manly)  spoke w/ Dr Truett Perna (oncology)  to start XRT soon, plan for chemoRx when patient recovers further,  w/ XRT the esophageal stricture may improve. Would continue strict NPO, continue IV Protonix  2)FEN-  N.p.o., NG tube out, dietitian recommends Osmolite 1.5 @ 60 mL/hr via J-tube which  will provides 2160 kcal, 90 grams of protein, and 1097 mL free water.,  Continue 50 mL free water every 4 hours via J-tube (300 mL/day), give thiamine and folic acid/multivitamin the patient with history of alcohol abuse  3) Candida respiratory infection- BAL cultures growing Candida albicans, discussed with Dr. Marchelle Gearing, okay to treat with Diflucan for 1 week (first dose 07/28/2017)  4)HTN-stable, continue metoprolol 25 mg twice daily  5)Social/Ethics- Full Code, plan of care discussed with patient and brother-in-law at bedside  6)Disposition-patient has significant post acute illness debility/weakness and poor endurance, physical therapy evaluation and social work consult appreciated, patient will need skilled nursing facility rehab  Consults  :  Gi/PCCMM/Oncology [facility])  Procedures:-  EGD from 07/13/2017 with malignant esophageal stricture J-tube placed on 07/15/2017 Revision of J-tube on 07/20/2017 Initially extubated 07/21/2017 Reintubated 07/23/2017 Extubated 07/25/2017 NG tube placement by interventional radiology on 07/26/2017   DVT Prophylaxis  :  Heparin/ SCDs  Lab Results  Component Value Date   PLT 236 07/28/2017    Inpatient Medications  Scheduled Meds: . chlorhexidine gluconate (MEDLINE KIT)  15 mL Mouth Rinse BID  . Chlorhexidine Gluconate Cloth  6 each Topical Daily  . folic acid  1 mg Intravenous Daily  . free water  50 mL Per Tube Q4H  . heparin  5,000 Units Subcutaneous Q8H  .  ipratropium-albuterol  3 mL Nebulization QID  . mouth rinse  15 mL Mouth Rinse QID  . metoprolol tartrate  25 mg Per Tube BID  . multivitamin  15 mL Oral Daily  . neomycin-bacitracin-polymyxin   Topical Once  . pantoprazole (PROTONIX) IV  40 mg Intravenous Q12H  . thiamine injection  100 mg Intravenous Daily   Continuous Infusions: . sodium chloride 250 mL (07/27/17 1600)  . ertapenem Stopped (07/28/17 1425)  . feeding supplement (OSMOLITE 1.5 CAL) 1,000 mL (07/28/17 1350)  .  [START ON 07/29/2017] fluconazole (DIFLUCAN) IV     PRN Meds:.sodium chloride, acetaminophen, fentaNYL (SUBLIMAZE) injection, hydrALAZINE, metoprolol tartrate, ondansetron **OR** ondansetron (ZOFRAN) IV, sodium chloride flush    Anti-infectives (From admission, onward)   Start     Dose/Rate Route Frequency Ordered Stop   07/29/17 0800  fluconazole (DIFLUCAN) IVPB 100 mg  Status:  Discontinued     100 mg 50 mL/hr over 60 Minutes Intravenous Every 24 hours 07/28/17 0840 07/28/17 0841   07/29/17 0800  fluconazole (DIFLUCAN) IVPB 100 mg     100 mg 50 mL/hr over 60 Minutes Intravenous Every 24 hours 07/28/17 0841 08/05/17 0759   07/28/17 0845  fluconazole (DIFLUCAN) IVPB 200 mg     200 mg 100 mL/hr over 60 Minutes Intravenous  Once 07/28/17 0840 07/28/17 1030   07/27/17 1000  fluconazole (DIFLUCAN) 40 MG/ML suspension 200 mg  Status:  Discontinued     200 mg Per Tube Daily 07/26/17 0925 07/26/17 1100   07/26/17 1000  fluconazole (DIFLUCAN) 40 MG/ML suspension 400 mg  Status:  Discontinued     400 mg Per Tube  Once 07/26/17 0924 07/26/17 1100   07/20/17 1400  ertapenem (INVANZ) 1 g in sodium chloride 0.9 % 50 mL IVPB     1 g 100 mL/hr over 30 Minutes Intravenous Every 24 hours 07/20/17 1209     07/16/17 0000  ceFAZolin (ANCEF) IVPB 2g/100 mL premix     2 g 200 mL/hr over 30 Minutes Intravenous Every 8 hours 07/15/17 1833 07/16/17 0054   07/15/17 0945  amoxicillin-clavulanate (AUGMENTIN) 250-62.5 MG/5ML suspension 500 mg     500 mg Oral Every 8 hours 07/15/17 0938 07/19/17 0928   07/15/17 0600  ceFAZolin (ANCEF) IVPB 2g/100 mL premix     2 g 200 mL/hr over 30 Minutes Intravenous On call to O.R. 07/14/17 1809 07/15/17 1632   07/11/17 1600  ampicillin-sulbactam (UNASYN) 1.5 g in sodium chloride 0.9 % 50 mL IVPB  Status:  Discontinued     1.5 g 100 mL/hr over 30 Minutes Intravenous Every 6 hours 07/11/17 1329 07/15/17 0940   07/08/17 1000  cefTRIAXone (ROCEPHIN) 1 g in dextrose 5 % 50 mL IVPB   Status:  Discontinued     1 g 100 mL/hr over 30 Minutes Intravenous Every 24 hours 07/08/17 0954 07/11/17 1315   07/08/17 1000  azithromycin (ZITHROMAX) 500 mg in dextrose 5 % 250 mL IVPB  Status:  Discontinued     500 mg 250 mL/hr over 60 Minutes Intravenous Every 24 hours 07/08/17 0954 07/11/17 1315        Objective:   Vitals:   07/28/17 1300 07/28/17 1400 07/28/17 1500 07/28/17 1600  BP: 108/65 107/65 (!) 99/48 (!) 108/52  Pulse:      Resp:  18 18 17   Temp:  99.1 F (37.3 C) 99.1 F (37.3 C) 99.1 F (37.3 C)  TempSrc:      SpO2:  97% 97% 94%  Weight:  Height:        Wt Readings from Last 3 Encounters:  07/28/17 61.6 kg (135 lb 12.9 oz)  06/29/17 57.2 kg (126 lb)  01/04/17 72.9 kg (160 lb 12.8 oz)     Intake/Output Summary (Last 24 hours) at 07/28/2017 1827 Last data filed at 07/28/2017 0553 Gross per 24 hour  Intake 377.67 ml  Output 580 ml  Net -202.33 ml     Physical Exam  Gen:- Awake Alert, patient looks emanciated in no apparent distress  HEENT:- [place].AT, No sclera icterus Neck-Supple Neck,No JVD, Nose- [place] 2 L/min Lungs-diminished in bases CV- S1, S2 normal Abd-  +ve B.Sounds, Abd Soft, No tenderness, J-tube site is clean dry and intact    Extremity/Skin:- No  edema,    Neuro-no tremors, generalized weakness without new focal deficits Psych-affect is appropriate    Data Review:   Micro Results Recent Results (from the past 240 hour(s))  Culture, blood (routine x 2)     Status: None   Collection Time: 07/20/17  3:58 PM  Result Value Ref Range Status   Specimen Description BLOOD RIGHT ANTECUBITAL  Final   Special Requests   Final    BOTTLES DRAWN AEROBIC ONLY Blood Culture adequate volume   Culture   Final    NO GROWTH 5 DAYS Performed at [facility], [address] [place], [place] [address]    Report Status 07/25/2017 FINAL  Final  Culture, blood (routine x 2)     Status: None   Collection Time: 07/20/17  3:58 PM  Result Value  Ref Range Status   Specimen Description BLOOD RIGHT HAND  Final   Special Requests   Final    BOTTLES DRAWN AEROBIC ONLY Blood Culture adequate volume   Culture   Final    NO GROWTH 5 DAYS Performed at [facility], [address] [place], [place] [address]    Report Status 07/25/2017 FINAL  Final  MRSA PCR Screening     Status: None   Collection Time: 07/20/17  6:40 PM  Result Value Ref Range Status   MRSA by PCR NEGATIVE NEGATIVE Final    Comment:        The GeneXpert MRSA Assay (FDA approved for NASAL specimens only), is one component of a comprehensive MRSA colonization surveillance program. It is not intended to diagnose MRSA infection nor to guide or monitor treatment for MRSA infections.   Culture, respiratory (NON-Expectorated)     Status: None   Collection Time: 07/20/17  7:51 PM  Result Value Ref Range Status   Specimen Description TRACHEAL ASPIRATE  Final   Special Requests NONE  Final   Gram Stain   Final    ABUNDANT WBC PRESENT, PREDOMINANTLY PMN FEW YEAST Performed at [facility], [address], [place], [place] [address]    Culture MODERATE CANDIDA ALBICANS  Final   Report Status 07/23/2017 FINAL  Final  Culture, bal-quantitative     Status: Abnormal   Collection Time: 07/23/17  9:30 AM  Result Value Ref Range Status   Specimen Description BRONCHIAL ALVEOLAR LAVAGE  Final   Special Requests NONE  Final   Gram Stain   Final    ABUNDANT WBC PRESENT, PREDOMINANTLY PMN RARE SQUAMOUS EPITHELIAL CELLS PRESENT FEW BUDDING YEAST SEEN Performed at [facility], [address], [place], [place] [address]    Culture 50,000 COLONIES/mL CANDIDA ALBICANS (A)  Final   Report Status 07/25/2017 FINAL  Final  Body  fluid culture     Status: None (Preliminary result)   Collection Time: 07/26/17  1:00 PM  Result Value Ref Range Status   Specimen Description PLEURAL  Final   Special Requests Normal  Final   Gram Stain   Final    WBC PRESENT,  PREDOMINANTLY PMN NO ORGANISMS SEEN CYTOSPIN SMEAR    Culture   Final    NO GROWTH 2 DAYS Performed at [facility], [address] [place], [place] [address]    Report Status PENDING  Incomplete    Radiology Reports Ct Abdomen Pelvis Wo Contrast  Result Date: 07/08/2017 CLINICAL DATA:  72 year old male with nausea vomiting. EXAM: CT CHEST, ABDOMEN AND PELVIS WITHOUT CONTRAST TECHNIQUE: Multidetector CT imaging of the chest, abdomen and pelvis was performed following the standard protocol without IV contrast. COMPARISON:  Radiograph of the chest abdomen pelvis dated 07/07/2017 FINDINGS: Evaluation of this exam is limited in the absence of intravenous contrast. CT CHEST FINDINGS Cardiovascular: There is no cardiomegaly. Coronary vascular calcification primarily involving the LAD. Small anterior pericardial effusion measuring 8 mm in thickness. There is mild atherosclerotic calcification of the thoracic aorta. The thoracic aorta and central pulmonary arteries are grossly unremarkable on this noncontrast CT. Mediastinum/Nodes: No hilar or mediastinal adenopathy. The esophagus contains a mixed density fluid content with areas of higher attenuation, likely residual oral contrast. Findings likely represent reflux or esophageal dysmotility and delayed emptying. Blood product is less likely. Clinical correlation is recommended. Endoscopy may provide better evaluation of the esophagus if clinically indicated. Lungs/Pleura: There is emphysematous changes of the lungs. Patchy area of nodular airspace density primarily involving the right middle lobe and anterior right upper lobe most consistent with pneumonia. Clinical correlation and follow-up to resolution recommended. There is mild bronchiectatic changes. No pneumothorax or pleural effusion. Endobronchial content in the region of the carina and proximal portion of the left mainstem bronchus likely mucous plugging. Musculoskeletal: There is degenerative  changes of the spine. Old healed left rib fractures. No acute osseous pathology. CT ABDOMEN PELVIS FINDINGS No intra-abdominal free air or free fluid. Hepatobiliary: The liver is unremarkable. No intrahepatic biliary ductal dilatation. There is probable sludge or small stones within the gallbladder. No pericholecystic fluid or evidence of acute cholecystitis by CT. Pancreas: Unremarkable. No pancreatic ductal dilatation or surrounding inflammatory changes. Spleen: Normal in size without focal abnormality. Adrenals/Urinary Tract: The adrenal glands are unremarkable. Multiple bilateral renal hypodense lesions are not characterized on this CT but appear to demonstrate fluid attenuation. Ultrasound may provide better characterisation. Punctate nonobstructing right renal calculi versus vascular calcification. There is no hydronephrosis or obstructing stone on either side. The visualized ureters and urinary bladder appear unremarkable. Stomach/Bowel: There is a small hiatal hernia. Oral contrast opacifies multiple loops of small bowel. There is no evidence of bowel obstruction or active inflammation. There is sigmoid diverticulosis without active inflammation. Normal appendix. Vascular/Lymphatic: Advanced aortoiliac atherosclerotic disease. The IVC is grossly unremarkable. No portal venous gas. There is no adenopathy. Reproductive: The prostate and seminal vesicles are grossly unremarkable. Other: None Musculoskeletal: Osteopenia with degenerative changes of the lower lumbar spine. No acute osseous pathology. IMPRESSION: 1. Patchy areas of nodular airspace density primarily in the right middle lobe and right upper lobe most consistent with pneumonia. Clinical correlation and follow-up to resolution after treatment recommended. 2. Emphysema.  No pleural effusion or pneumothorax. 3. Mucous plugging in the proximal left mainstem bronchus. 4. Sigmoid diverticulosis. No bowel obstruction or active inflammation. Normal  appendix. 5. Bilateral  renal hypodense lesions, incompletely characterized. Ultrasound may provide better evaluation. No hydronephrosis or obstructing stone. 6. Aortic Atherosclerosis (ICD10-I70.0) and Emphysema (ICD10-J43.9). Electronically Signed   By: Elgie Collard M.D.   On: 07/08/2017 05:47   Dg Chest 1 View  Result Date: 07/20/2017 CLINICAL DATA:  Shortness of breath, weakness. History of esophageal malignancy, COPD on home oxygen, former smoker. EXAM: CHEST 1 VIEW COMPARISON:  Chest x-ray of July 19, 2017 FINDINGS: There is persistent infiltrate at the right lung base. There no large pleural effusion. The left lung is clear. The heart and pulmonary vascularity are normal. There is mild tortuosity of the ascending thoracic aorta. The bony thorax is unremarkable. IMPRESSION: COPD. Right basilar pneumonia little changed from yesterday's study. Electronically Signed   By: David  [place] M.D.   On: 07/20/2017 15:12   Dg Abd 1 View  Result Date: 07/26/2017 CLINICAL DATA:  NG tube advancement EXAM: ABDOMEN - 1 VIEW COMPARISON:  07/25/2017 FINDINGS: NG tube looped in the distal esophagus. Right lower lobe opacity with small right pleural effusion. Skin staples overlying the upper abdomen. IMPRESSION: NG tube looped in the distal esophagus. These results will be called to the ordering clinician or representative by the Radiologist Assistant, and communication documented in the PACS or zVision Dashboard. Electronically Signed   By: Charline Bills M.D.   On: 07/26/2017 09:32   Dg Abd 1 View  Result Date: 07/25/2017 CLINICAL DATA:  Status post NG tube placement today. EXAM: ABDOMEN - 1 VIEW COMPARISON:  CT abdomen and pelvis 07/20/2017. Single-view of the chest 07/25/2017. FINDINGS: The patient has 2 NG tubes in place. Tip and side-port of 1 of the tubes are in the stomach. Tip of the second tube is at the gastroesophageal junction and should be advanced 7-8 cm. Endotracheal tube is unchanged and in  good position. Lungs are emphysematous. Right basilar airspace disease and effusion are unchanged. Heart size is normal. No pneumothorax. IMPRESSION: Two NG tubes are identified. One is in good position. Tip of the second is at gastroesophageal junction and should be advanced 7-8 cm. Endotracheal tube in good position. No change in a right effusion and airspace disease. Emphysema. Electronically Signed   By: Drusilla Kanner M.D.   On: 07/25/2017 10:20   Ct Chest Wo Contrast  Result Date: 07/08/2017 CLINICAL DATA:  72 year old male with nausea vomiting. EXAM: CT CHEST, ABDOMEN AND PELVIS WITHOUT CONTRAST TECHNIQUE: Multidetector CT imaging of the chest, abdomen and pelvis was performed following the standard protocol without IV contrast. COMPARISON:  Radiograph of the chest abdomen pelvis dated 07/07/2017 FINDINGS: Evaluation of this exam is limited in the absence of intravenous contrast. CT CHEST FINDINGS Cardiovascular: There is no cardiomegaly. Coronary vascular calcification primarily involving the LAD. Small anterior pericardial effusion measuring 8 mm in thickness. There is mild atherosclerotic calcification of the thoracic aorta. The thoracic aorta and central pulmonary arteries are grossly unremarkable on this noncontrast CT. Mediastinum/Nodes: No hilar or mediastinal adenopathy. The esophagus contains a mixed density fluid content with areas of higher attenuation, likely residual oral contrast. Findings likely represent reflux or esophageal dysmotility and delayed emptying. Blood product is less likely. Clinical correlation is recommended. Endoscopy may provide better evaluation of the esophagus if clinically indicated. Lungs/Pleura: There is emphysematous changes of the lungs. Patchy area of nodular airspace density primarily involving the right middle lobe and anterior right upper lobe most consistent with pneumonia. Clinical correlation and follow-up to resolution recommended. There is mild  bronchiectatic changes. No pneumothorax or pleural  effusion. Endobronchial content in the region of the carina and proximal portion of the left mainstem bronchus likely mucous plugging. Musculoskeletal: There is degenerative changes of the spine. Old healed left rib fractures. No acute osseous pathology. CT ABDOMEN PELVIS FINDINGS No intra-abdominal free air or free fluid. Hepatobiliary: The liver is unremarkable. No intrahepatic biliary ductal dilatation. There is probable sludge or small stones within the gallbladder. No pericholecystic fluid or evidence of acute cholecystitis by CT. Pancreas: Unremarkable. No pancreatic ductal dilatation or surrounding inflammatory changes. Spleen: Normal in size without focal abnormality. Adrenals/Urinary Tract: The adrenal glands are unremarkable. Multiple bilateral renal hypodense lesions are not characterized on this CT but appear to demonstrate fluid attenuation. Ultrasound may provide better characterisation. Punctate nonobstructing right renal calculi versus vascular calcification. There is no hydronephrosis or obstructing stone on either side. The visualized ureters and urinary bladder appear unremarkable. Stomach/Bowel: There is a small hiatal hernia. Oral contrast opacifies multiple loops of small bowel. There is no evidence of bowel obstruction or active inflammation. There is sigmoid diverticulosis without active inflammation. Normal appendix. Vascular/Lymphatic: Advanced aortoiliac atherosclerotic disease. The IVC is grossly unremarkable. No portal venous gas. There is no adenopathy. Reproductive: The prostate and seminal vesicles are grossly unremarkable. Other: None Musculoskeletal: Osteopenia with degenerative changes of the lower lumbar spine. No acute osseous pathology. IMPRESSION: 1. Patchy areas of nodular airspace density primarily in the right middle lobe and right upper lobe most consistent with pneumonia. Clinical correlation and follow-up to resolution  after treatment recommended. 2. Emphysema.  No pleural effusion or pneumothorax. 3. Mucous plugging in the proximal left mainstem bronchus. 4. Sigmoid diverticulosis. No bowel obstruction or active inflammation. Normal appendix. 5. Bilateral renal hypodense lesions, incompletely characterized. Ultrasound may provide better evaluation. No hydronephrosis or obstructing stone. 6. Aortic Atherosclerosis (ICD10-I70.0) and Emphysema (ICD10-J43.9). Electronically Signed   By: Elgie Collard M.D.   On: 07/08/2017 05:47   Ct Abdomen Pelvis W Contrast  Result Date: 07/20/2017 CLINICAL DATA:  Recently diagnosed esophageal cancer. Abdominal distention. Recent jejunostomy (5 days postop) tube with fluid leak concerning for ascites EXAM: CT ABDOMEN AND PELVIS WITH CONTRAST TECHNIQUE: Multidetector CT imaging of the abdomen and pelvis was performed using the standard protocol following bolus administration of intravenous contrast. CONTRAST:  100 cc Isovue-300 intravenous COMPARISON:  07/08/2017 FINDINGS: Lower chest: Airspace opacity in the right lower lobe. Improved airspace disease in the right middle lobe. Suspect aspiration given the chronically distended esophagus above the GE junction mass. Small bilateral pleural effusion. Hepatobiliary: Geographic low-density in segment 5 and 8 of the liver without visible underlying lesions seen. Negative gallbladder. Pancreas: Unremarkable. Spleen: Unremarkable. Adrenals/Urinary Tract: Negative adrenals. No hydronephrosis or stone. Moderate scarring involving the upper pole left renal cortex. Bilateral simple appearing renal cysts. Unremarkable bladder. Stomach/Bowel: Recent jejunostomy in unremarkable position. The bowel is kinked at the level of the enterotomy and there is marked proximal small bowel and stomach distension. Small volume pneumoperitoneum which is expected after recent surgery. Distended lower esophagus above a GE junction mass. Colonic diverticulosis.  Vascular/Lymphatic: Atherosclerosis with advanced atheromatous wall thickening of the aorta. No definite adenopathy. PET-CT would be more sensitive in this setting. Reproductive:No pathologic findings. Other: No ascites or pneumoperitoneum. Musculoskeletal: No acute abnormalities. A call has been placed to the ordering provider. IMPRESSION: 1. High-grade proximal small bowel obstruction with transition at the jejunostomy tube. There is a degree of closed loop physiology given the GE junction mass and severe stenosis. 2. Dilated esophagus above the GE junction mass. There  is shifting opacity at the right base concerning for recurrent aspiration. 3. Small pleural effusions. 4. Perfusion anomaly in the ventral right lobe liver without visible underlying cause. Attention on future staging scans. Electronically Signed   By: Marnee Spring M.D.   On: 07/20/2017 15:07   Dg Esophagus  Result Date: 07/11/2017 CLINICAL DATA:  Persistent dysphagia, nausea/vomiting, unable to tolerate more than a tablespoon of food at a time, decreased appetite EXAM: ESOPHOGRAM/BARIUM SWALLOW TECHNIQUE: Single contrast examination was performed using  thin barium. FLUOROSCOPY TIME:  Fluoroscopy Time:  3.1 minutes Radiation Exposure Index (if provided by the fluoroscopic device): 1.9 mGy Number of Acquired Spot Images: 5 COMPARISON:  CT chest abdomen pelvis dated 07/08/2017 FINDINGS: Moderate esophageal dysmotility with fluid/debris in the mid/distal esophagus. Marked narrowing/stricture of the distal esophagus just above the GE junction. After significant delay, trace contrast passed into the proximal stomach. When correlating with the recent CT, there is no evidence of esophageal mass. However, a fat containing hernia at the esophageal hiatus is present, possibly leading to extrinsic compression. IMPRESSION: Severe stricture of the distal esophagus just above the GE junction. Secondary esophageal dysmotility. When correlating with recent  CT, there is no evidence of esophageal mass. Possible extrinsic compression secondary to a fat containing hernia. Consider endoscopy for further evaluation. Electronically Signed   By: Charline Bills M.D.   On: 07/11/2017 10:21   US Renal  Result Date: 07/09/2017 CLINICAL DATA:  Elevated creatinine EXAM: RENAL / URINARY TRACT ULTRASOUND COMPLETE COMPARISON:  CT 07/08/2017 FINDINGS: Right Kidney: Length: 10.6 cm. Echogenicity within normal limits. No mass or hydronephrosis visualized. Left Kidney: Length: 10.3 cm. Multiple cysts in the left kidney, the largest 4.5 cm. These appear benign. No hydronephrosis. Bladder: Appears normal for degree of bladder distention. IMPRESSION: Left renal cysts. No acute findings.  No hydronephrosis. Electronically Signed   By: Charlett Nose M.D.   On: 07/09/2017 15:56   Dg Abdomen Peg Tube Location  Result Date: 07/19/2017 CLINICAL DATA:  Jejunostomy tube leaking. EXAM: ABDOMEN - 1 VIEW COMPARISON:  07/19/2016. FINDINGS: KUB following nonionic contrast administration into enterostomy tube obtained. Enterostomy tube tip noted within a right-sided abdominal small-bowel loop. No definite leakage noted. Contrast from prior study noted in the colon. IMPRESSION: Enterostomy tube tip noted in a small-bowel loop in the right abdomen. Electronically Signed   By: Maisie Fus  Register   On: 07/19/2017 16:41   Dg Chest Port 1 View  Result Date: 07/27/2017 CLINICAL DATA:  Atelectasis. EXAM: PORTABLE CHEST 1 VIEW COMPARISON:  Radiographs of July 26, 2016. FINDINGS: Stable cardiomediastinal silhouette. Stable right lower lobe opacity is noted concerning for pneumonia. No pneumothorax is noted. Mild left basilar subsegmental atelectasis is noted. Bony thorax is unremarkable. IMPRESSION: Stable right lower lobe pneumonia. Electronically Signed   By: Lupita Raider, M.D.   On: 07/27/2017 08:07   Dg Chest Port 1 View  Result Date: 07/26/2017 CLINICAL DATA:  Thoracentesis. EXAM:  PORTABLE CHEST 1 VIEW COMPARISON:  One-view chest x-ray from the same day. FINDINGS: The heart size is normal. Interstitial and airspace disease at the right lung base is slightly improved. There is no pneumothorax. Mild left base airspace disease present. Emphysematous changes are noted. The NG tube was removed. IMPRESSION: 1. Persistent right lower lobe interstitial and airspace disease concerning for pneumonia. 2. Partial clearing of the right upper lobe. 3. Mild atelectasis left base. 4. COPD. 5. Interval removal of NG tube. Electronically Signed   By: Virl Son.D.  On: 07/26/2017 14:03   Dg Chest Port 1 View  Result Date: 07/26/2017 CLINICAL DATA:  72 year old male with obstructing esophageal cancer. Postoperative day 6 status post abdominal surgery for small bowel obstruction at a jejunostomy tube site. EXAM: PORTABLE CHEST 1 VIEW COMPARISON:  07/25/2017 and earlier. FINDINGS: Portable AP semi upright view at 0454 hours. Extubated. Enteric tube remains in place, but is looped in the distal esophagus level. Yesterday the tube appear to course into the stomach. Continued volume loss in the right lung with mild rightward shift of the mediastinum. Superimposed veiling right lung opacity suggesting pleural effusion. Large left lung volume. The left lung remains clear. Stable mediastinal contours. IMPRESSION: 1. Enteric tube now is looped in the distal esophagus, whereas yesterday tube appeared to course into the stomach. 2. Extubated. 3. Suspect combined lower lobe collapse and pleural effusion in the right lung. Consider mucous plug. 4. The left lung remains clear. Electronically Signed   By: Odessa Fleming M.D.   On: 07/26/2017 07:41   Dg Chest Port 1 View  Result Date: 07/25/2017 CLINICAL DATA:  Respiratory failure EXAM: PORTABLE CHEST 1 VIEW COMPARISON:  Portable chest x-ray of July 24, 2017 FINDINGS: There is increased density in the right mid and lower lung as compared to yesterday's study.  The left lung is clear. The heart and pulmonary vascularity are normal. The endotracheal tube tip lies approximately 3.8 cm above the carina. The esophagogastric tube tip in proximal port project below the GE junction. IMPRESSION: Increased density on the right consistent with worsening atelectasis or pneumonia and likely a posterior layering pleural effusion. Underlying COPD. The support tubes are in reasonable position. Electronically Signed   By: David  [place] M.D.   On: 07/25/2017 06:55   Dg Chest Port 1 View  Result Date: 07/24/2017 CLINICAL DATA:  Follow-up ventilator support. EXAM: PORTABLE CHEST 1 VIEW COMPARISON:  07/23/2017 FINDINGS: Endotracheal tube tip is 4 cm above the carina. Nasogastric tube enters the abdomen. Background emphysema. Left lung is otherwise clear. Persistent pneumonia and volume loss in the right lower lung. I think there is slight radiographic improvement. IMPRESSION: Slight improvement in lower right lung pneumonia. Electronically Signed   By: Paulina Fusi M.D.   On: 07/24/2017 06:53   Dg Chest Port 1 View  Result Date: 07/23/2017 CLINICAL DATA:  Post intubation and enteric tube placement EXAM: PORTABLE CHEST 1 VIEW COMPARISON:  07/22/2017 FINDINGS: Endotracheal tube placed with tip measuring 3 cm above the carina. Enteric tube tip is coiled in the left upper quadrant consistent with location in the upper stomach. Volume loss and consolidation in the right lung with hyperinflation of the left lung. Probable right pleural effusion. Right lung changes are progressing since the previous study. No pneumothorax. IMPRESSION: Appliances appear in satisfactory position. Progression of consolidation, effusion, and volume loss in the right lung since previous study. Compensatory hyperinflation of the left lung. Electronically Signed   By: Burman Nieves M.D.   On: 07/23/2017 03:51   Dg Chest Port 1 View  Result Date: 07/22/2017 CLINICAL DATA:  Status postextubation.  Airspace  consolidation. EXAM: PORTABLE CHEST 1 VIEW COMPARISON:  July 21, 2017 FINDINGS: Endotracheal tube and nasogastric tube have been removed. No pneumothorax. There is airspace consolidation throughout the right mid lower lung zones, increased from 1 day prior. There is a small right pleural effusion. The left lung is somewhat hyperexpanded. There is no edema or consolidation on the left. Heart size and pulmonary vascular normal. No adenopathy appreciable. Old healed  fractures noted on the left. IMPRESSION: No pneumothorax. Increase in airspace consolidation in the right mid and lower lung zones with small right pleural effusion. Question progression of pneumonia versus aspiration. Both entities may be present concurrently. Left lung hyperexpanded but clear. Stable cardiac silhouette. Electronically Signed   By: Bretta Bang III M.D.   On: 07/22/2017 07:32   Portable Chest Xray  Result Date: 07/21/2017 CLINICAL DATA:  Endotracheal tube positioning EXAM: PORTABLE CHEST 1 VIEW COMPARISON:  Yesterday FINDINGS: Endotracheal tube tip between the clavicular heads and carina. An orogastric tube reaches the stomach. Lung opacity at the right more than left base. Small pleural effusions by abdominal CT yesterday. Large lung volumes compatible with COPD. IMPRESSION: 1. Stable positioning of endotracheal and orogastric tubes. 2. Small pleural effusions and right more than left lower lobe opacity. Clinical circumstances primarily concerning for aspiration pneumonitis/pneumonia. Electronically Signed   By: Marnee Spring M.D.   On: 07/21/2017 07:07   Portable Chest X-ray  Result Date: 07/20/2017 CLINICAL DATA:  72 year old male with a history of endotracheal tube placement EXAM: PORTABLE CHEST 1 VIEW COMPARISON:  07/20/2017 abdominal CT 07/20/2017 FINDINGS: Cardiomediastinal silhouette unchanged in size and contour. Interval placement of endotracheal tube, terminating suitably above the carina approximately 4 cm.  Interval placement of gastric tube, terminating out of the field of view. Similar appearance of bibasilar opacities, more pronounced on the right. No pneumothorax IMPRESSION: Interval placement of endotracheal tube, terminating suitably above the carina. Interval placement of gastric tube, terminating out of the field of view. Similar appearance of right greater than left airspace disease Electronically Signed   By: Gilmer Mor D.O.   On: 07/20/2017 20:01   Dg Chest Port 1 View  Result Date: 07/19/2017 CLINICAL DATA:  Shortness of breath. EXAM: PORTABLE CHEST 1 VIEW COMPARISON:  CT 07/08/2016.  Chest x-ray 01/13/2016. FINDINGS: Mediastinum hilar structures normal. Cardiomegaly with normal pulmonary vascularity. COPD. Right base infiltrate with small right pleural effusion. IMPRESSION: 1. Right base infiltrate consistent with pneumonia. Small right pleural effusion. Similar findings noted on prior CT of 07/08/2016. 2.  COPD. Electronically Signed   By: Maisie Fus  Register   On: 07/19/2017 13:51   Dg Abd Acute W/chest  Result Date: 07/08/2017 CLINICAL DATA:  Vomiting for 10 days.  Weight loss. EXAM: DG ABDOMEN ACUTE W/ 1V CHEST COMPARISON:  Chest radiograph 01/13/2016 FINDINGS: The lungs are hyperexpanded with multiple areas of increased lucency. No focal airspace consolidation or pulmonary edema. No pneumothorax or sizable pleural effusion. No free intraperitoneal air. No dilated loops of bowel are visible. The round densities overlying the left psoas muscle may be within the transverse colon. IMPRESSION: 1. COPD without acute airspace disease. 2. No free intraperitoneal air or evidence of small-bowel obstruction. Electronically Signed   By: Deatra Robinson M.D.   On: 07/08/2017 00:07   Dg Abd Portable 1v  Result Date: 07/27/2017 CLINICAL DATA:  Gastroesophageal ca, f/u feeding tube placement, some fullness feeling in stomach EXAM: PORTABLE ABDOMEN - 1 VIEW COMPARISON:  Radiograph 129 19 FINDINGS: Enteric  tube projects over the RIGHT lower quadrant. No dilated large or small bowel. Small amount of high-density stool remains in rectum IMPRESSION: No bowel obstruction Electronically Signed   By: Genevive Bi M.D.   On: 07/27/2017 10:13   Dg Abd Portable 1v  Result Date: 07/19/2017 CLINICAL DATA:  COPD, nausea vomiting EXAM: PORTABLE ABDOMEN - 1 VIEW COMPARISON:  None. FINDINGS: No dilated large or small bowel. Jejunostomy tube with tip in the RIGHT  lower quadrant. Midline skin staples noted. No evidence bowel obstruction. Oral contrast from prior CT within the colon IMPRESSION: 1. No evidence of bowel obstruction. 2. Jejunostomy tube in place. Electronically Signed   By: Genevive Bi M.D.   On: 07/19/2017 10:34   Dg Kayleen Memos W/o Kub  Result Date: 07/26/2017 CLINICAL DATA:  72 year old male with esophageal cancer. Subsequent encounter. EXAM: UPPER GI SERIES WITHOUT KUB TECHNIQUE: Routine upper GI series was performed with thin barium. FLUOROSCOPY TIME:  Fluoroscopy Time:  1 minute and 48 seconds Radiation Exposure Index: 5.6 mGy COMPARISON:  07/26/2017 chest x-ray.  07/20/2017 CT. FINDINGS: Patient's recent nasogastric tube was removed by nursing staff prior to coming to radiology. Request for placing nasogastric tube and performing upper GI series and small-bowel follow-through. Procedure discussed with patient. Lidocaine gel utilized. Nasogastric tube gently advanced. Not able to advance into the stomach. Barium instilled. Barium did not traverses into the stomach. Barium was aspirated and nasogastric tube removed. IMPRESSION: Unsuccessful placement of nasogastric tube. Not able to performed upper GI series and small-bowel follow-through as noted above. Results discussed with patient's nurse with request to call covering physician with results. Electronically Signed   By: Lacy Duverney M.D.   On: 07/26/2017 11:48     CBC Recent Labs  Lab 07/24/17 0321 07/25/17 0333 07/26/17 0119 07/27/17 0307  07/28/17 0318  WBC 19.3* 15.5* 17.4* 14.0* 13.5*  HGB 12.3* 12.5* 12.8* 11.7* 11.3*  HCT 36.7* 38.0* 39.3 34.5* 34.6*  PLT 266 254 305 299 236  MCV 90.0 89.2 89.9 88.9 89.2  MCH 30.1 29.3 29.3 30.2 29.1  MCHC 33.5 32.9 32.6 33.9 32.7  RDW 16.2* 16.4* 16.3* 16.2* 16.3*    Chemistries  Recent Labs  Lab 07/24/17 0321 07/24/17 2259 07/25/17 0333 07/26/17 0119 07/27/17 0307 07/28/17 0318  NA 141 141 141 138 140 140  K 3.9 3.8 3.9 4.2 3.8 3.8  CL 106 107 108 103 105 103  CO2 29 29 28 28  32 33*  GLUCOSE 130* 132* 113* 87 166* 107*  BUN 11 10 11 10 14 13   CREATININE 0.79 0.69 0.67 0.61 0.78 0.71  CALCIUM 8.2* 8.0* 7.9* 8.3* 8.0* 7.7*  MG 1.9 1.9 1.8 1.6* 2.4  --   AST  --   --   --  16 36 41  ALT  --   --   --  15* 22 33  ALKPHOS  --   --   --  77 94 99  BILITOT  --   --   --  0.8 0.5 0.3   ------------------------------------------------------------------------------------------------------------------ Recent Labs    07/26/17 1357  CHOL 115    No results found for: HGBA1C ------------------------------------------------------------------------------------------------------------------ No results for input(s): TSH, T4TOTAL, T3FREE, THYROIDAB in the last 72 hours.  Invalid input(s): FREET3 ------------------------------------------------------------------------------------------------------------------ No results for input(s): VITAMINB12, FOLATE, FERRITIN, TIBC, IRON, RETICCTPCT in the last 72 hours.  Coagulation profile No results for input(s): INR, PROTIME in the last 168 hours.  No results for input(s): DDIMER in the last 72 hours.  Cardiac Enzymes Recent Labs  Lab 07/25/17 1344 07/25/17 1916 07/26/17 0119  TROPONINI <0.03 <0.03 <0.03   ------------------------------------------------------------------------------------------------------------------ No results found for: BNP   Shon Hale M.D on 07/28/2017 at 6:27 PM  Between 7am to 7pm - Pager -  [phone]  After 7pm go to [email or web address] - password [email or web address]  [facility] -  Office  [phone]   Voice Recognition Reubin Milan dictation system was used to create this note, attempts have been made to  correct errors. Please contact the author with questions and/or clarifications.

## 2017-07-29 DIAGNOSIS — C160 Malignant neoplasm of cardia: Secondary | ICD-10-CM | POA: Diagnosis not present

## 2017-07-29 MED ORDER — INDOMETHACIN 25 MG PO CAPS
50.0000 mg | ORAL_CAPSULE | Freq: Once | ORAL | Status: AC
  Administered 2017-07-29: 50 mg via ORAL
  Filled 2017-07-29: qty 2

## 2017-07-29 NOTE — Progress Notes (Signed)
Physical Therapy Treatment Patient Details Name: Judy Germer MRN: [id number] DOB: 1945-10-10 Today's Date: 07/29/2017    History of Present Illness 72 y.o. male with medical history significant of COPD, CRF on 4 liters he thinks at home, htn comes in with over a week of nausea and vomiting.  EGD performed 07/13/17 found malignant appearing esophageal stenosis - biopsy sent.   Dx of PNA    PT Comments    RT in room giving pt a breathing TX plus vibration therapy.  Pt in bed on 4 lts nasal which pt puts in his mouth (intentionally).  Assisted OOB to amb a greater distance. Supine prior to activity     BP 132/73, HR 78, 4 lts nasal 98%, RA 92% EOB                                 BP 126/68, HR 84, RA 85% amb                                  BP 113/71, HR 88, RA 83% so reapplied 2 lts 86% so increased to 4 lts 91%    Follow Up Recommendations  SNF;Supervision/Assistance - 24 hour     Equipment Recommendations  Rolling walker with 5" wheels;3in1 (PT)    Recommendations for Other Services       Precautions / Restrictions Precautions Precautions: Fall Precaution Comments: NPO feeding tube Restrictions Weight Bearing Restrictions: No    Mobility  Bed Mobility Overal bed mobility: Needs Assistance Bed Mobility: Supine to Sit;Sit to Supine     Supine to sit: Min guard Sit to supine: Min guard;Min assist   General bed mobility comments: assist with blankets and safety with O2 line and IV line, min/guard for safety, cues for UE placement, moving around lines  Transfers Overall transfer level: Needs assistance Equipment used: Rolling walker (2 wheeled) Transfers: Sit to/from Stand Sit to Stand: Min assist         General transfer comment: steadying assist throughout, right side lean  Ambulation/Gait Ambulation/Gait assistance: Min assist;+2 safety/equipment Ambulation Distance (Feet): 135 Feet Assistive device: Rolling walker (2 wheeled) Gait Pattern/deviations:  Step-through pattern;Decreased stride length;Drifts right/left;Trunk flexed Gait velocity: decreased   General Gait Details: assist with lines/equipment; cues for RW position, posture, breathing; multimodal cues for posturing/difficulty shifting walker particulary towards R side (due to weakness)   Stairs            Wheelchair Mobility    Modified Rankin (Stroke Patients Only)       Balance                                            Cognition Arousal/Alertness: Awake/alert Behavior During Therapy: WFL for tasks assessed/performed Overall Cognitive Status: Within Functional Limits for tasks assessed                                        Exercises      General Comments        Pertinent Vitals/Pain Pain Assessment: No/denies pain    Home Living  Prior Function            PT Goals (current goals can now be found in the care plan section) Progress towards PT goals: Progressing toward goals    Frequency    Min 3X/week      PT Plan Discharge plan needs to be updated;Equipment recommendations need to be updated    Co-evaluation              AM-PAC PT "6 Clicks" Daily Activity  Outcome Measure  Difficulty turning over in bed (including adjusting bedclothes, sheets and blankets)?: A Little Difficulty moving from lying on back to sitting on the side of the bed? : A Lot Difficulty sitting down on and standing up from a chair with arms (e.g., wheelchair, bedside commode, etc,.)?: Unable Help needed moving to and from a bed to chair (including a wheelchair)?: A Little Help needed walking in hospital room?: A Little Help needed climbing 3-5 steps with a railing? : A Lot 6 Click Score: 14    End of Session Equipment Utilized During Treatment: Gait belt;Oxygen Activity Tolerance: Patient tolerated treatment well Patient left: in bed;with call bell/phone within reach Nurse Communication:  Mobility status PT Visit Diagnosis: Muscle weakness (generalized) (M62.81);Difficulty in walking, not elsewhere classified (R26.2)     Time: 0930-1010 PT Time Calculation (min) (ACUTE ONLY): 40 min  Charges:  $Gait Training: 23-37 mins $Therapeutic Activity: 8-22 mins                    G Codes:       Felecia Shelling  PTA [facility]  Acute  Rehab Pager      [phone]

## 2017-07-29 NOTE — Progress Notes (Signed)
Date: July 29, 2017 Velva Harman, BSN, [place], [place] Chart and notes review for patient progress and needs./iv abx and iv flds// Will follow for case management and discharge needs. No cm or discharge needs present at time of this review. Next review date: [id number]

## 2017-07-29 NOTE — Progress Notes (Signed)
IP PROGRESS NOTE  Subjective:   [name] reports mild nausea.  He cannot swallow liquids.  His brother-in-law's at the bedside. Objective: Vital signs in last 24 hours: Blood pressure (!) 100/52, pulse 71, temperature 98.6 F (37 C), resp. rate 19, height 5' 7.25" (1.708 m), weight 139 lb 1.8 oz (63.1 kg), SpO2 (!) 87 %.  Intake/Output from previous day: 01/31 0701 - 02/01 0700 In: 2578.8 [I.V.:465.3; NG/GT:2113.5] Out: 975 [Urine:975]  Physical Exam:  Abdomen: Healing midline incision, left abdomen feeding tube site without evidence of infection, the abdomen is soft Extremities: No leg edema   Portacath/PICC-without erythema  Lab Results: Recent Labs    07/27/17 0307 07/28/17 0318  WBC 14.0* 13.5*  HGB 11.7* 11.3*  HCT 34.5* 34.6*  PLT 299 236    BMET Recent Labs    07/27/17 0307 07/28/17 0318  NA 140 140  K 3.8 3.8  CL 105 103  CO2 32 33*  GLUCOSE 166* 107*  BUN 14 13  CREATININE 0.78 0.71  CALCIUM 8.0* 7.7*    Lab Results  Component Value Date   CEA1 2.8 07/15/2017    Medications: I have reviewed the patient's current medications.  Assessment/Plan: 1.  Esophagus cancer  Distal esophageal stricture noted on esophagram 07/11/2017  CTs of the chest, abdomen, and pelvis 07/08/2017-right lung pneumonia, emphysema, no evidence of metastatic disease  Upper endoscopy 07/13/2017- GE junction mass, could not be passed with standard endoscope, biopsy suspicious for adenocarcinoma  Right pleural fluid cytology 07/26/2017-negative  2.   Solid/liquid dysphagia secondary to #1  Placement of jejunostomy feeding tube 07/15/2017, revised 07/20/2017  3.   Weight loss/malnutrition, maintained on jejunostomy tube feedings  4.   COPD  5.   Heavy alcohol use  6.   Gout  7.   Hypertension  8.   Pneumonia  Mr.George Stokes appears unchanged.  He is tolerating tube feedings.  In stimulation yesterday.  The plan is to begin radiation 08/01/2017.  He will receive  concurrent weekly Taxol/carboplatin chemotherapy.  I reviewed the potential toxicities associated with the Taxol/carboplatin regimen.  He understands the chance for nausea/vomiting, mucositis, alopecia, and hematologic toxicity.  We discussed the chance of an allergic reaction with Taxol and carboplatin.  We reviewed the neuropathy and bone pain associated with Taxol.  He agrees to proceed.  The plan is to proceed with inpatient chemotherapy 08/01/2017 if he remains in the hospital.  Please call oncology over the weekend as needed.  I will check on him 08/01/2017.  A chemotherapy plan was entered today.     LOS: 21 days   Betsy Coder, MD   07/29/2017, 3:36 PM

## 2017-07-29 NOTE — Progress Notes (Signed)
Patient Demographics:    George Stokes, is a 72 y.o. male, DOB - 06-16-1946, ZOX:[id number]  Admit date - 07/07/2017   Admitting Physician Hillary Bow, DO  Outpatient Primary MD for the patient is Patient, No Pcp Per  LOS - 21  Chief Complaint  Patient presents with  . Emesis        Subjective:    George Stokes today has no fevers, no emesis,  No chest pain, brother-in-law at bedside, tolerating tube feeding well  Assessment  & Plan :    Principal Problem:   Adenocarcinoma of esophagus (HCC) Active Problems:   Essential hypertension   COPD with hypoxia (HCC)   Chronic respiratory failure with hypoxia (HCC)   Intractable nausea and vomiting   CAP (community acquired pneumonia)   Lobar pneumonia (HCC)   Hypomagnesemia   Hypokalemia   Protein-calorie malnutrition, severe (HCC)   Esophageal dysphagia   Esophageal mass   Esophageal stricture   Esophageal cancer (HCC)   Malnutrition of moderate degree   H/O exploratory laparotomy   Acute respiratory failure (HCC)   Atelectasis of right lung  Brief Summary:- 72 y.o. male reformed smoker with COPD, Chronic Hypoxic Resp Failure  on 4 liters , HTN and history of alcohol abuse admitted on 07/08/17 with N/V and a 20 pound weight loss.  S/P J tube placement, with complicated course due to SBO and leakage out of J tube. S/P Ex-Lap and jejunostomy revision. EGD performed 07/13/17 found malignant appearing esophageal stenosis/ annular adenocarcinoma (pathology/cytology was not definitive for malignancy), awaiting radiation and chemotherapy,  On 07/15/17  Had placement of a jejunostomy feeding tube ,  He developed an obstruction at the site of the feeding tube 07/20/2017 and was taken to the operating room for revision.  Following this procedure he had prolonged intubation and was extubated 07/25/2017. He underwent a right thoracentesis 07/26/2017.  The cytology  revealed acute inflammation (no malignant cells)     Plan:- 1)GE junction malignant esophageal stricture/ annular adenocarcinoma- Dr Christella Hartigan Sandria Manly)  spoke w/ Dr Truett Perna (oncology)  to start XRT soon, plan for chemoRx 08/01/17,  w/ XRT the esophageal stricture may improve. Would continue strict NPO, continue  Protonix  2)FEN-  N.p.o., NG tube out, c/n Osmolite 1.5 @ 60 mL/hr via J-tube which will provides 2160 kcal, 90 grams of protein, and 1097 mL free water.,  Continue 50 mL free water every 4 hours via J-tube (300 mL/day), c/n thiamine and folic acid/multivitamin the patient with history of alcohol abuse  3) Candida respiratory infection- BAL cultures growing Candida albicans, discussed with Dr. Marchelle Gearing, okay to treat with Diflucan for 1 week (first dose 07/28/2017)  4)HTN-stable, continue metoprolol 25 mg twice daily  5)Social/Ethics- Full Code, plan of care discussed with patient and brother-in-law at bedside  6)Disposition-patient has significant post acute illness debility/weakness and poor endurance, physical therapy evaluation and social work consult appreciated, awaiting insurance approval for placement to skilled nursing facility rehab  Consults  :  Gi/PCCMM/Oncology ([place])  Procedures:-  EGD from 07/13/2017 with malignant esophageal stricture (biopsy not conclusive) J-tube placed on 07/15/2017 Revision of J-tube on 07/20/2017 Initially extubated 07/21/2017 Reintubated 07/23/2017 Extubated 07/25/2017 NG tube placement by interventional radiology on 07/26/2017   DVT Prophylaxis  :  Heparin/ SCDs  Lab Results  Component Value Date   PLT 236 07/28/2017    Inpatient Medications  Scheduled Meds: . chlorhexidine gluconate (MEDLINE KIT)  15 mL Mouth Rinse BID  . Chlorhexidine Gluconate Cloth  6 each Topical Daily  . folic acid  1 mg Intravenous Daily  . free water  50 mL Per Tube Q4H  . heparin  5,000 Units Subcutaneous Q8H  . ipratropium-albuterol  3 mL Nebulization QID  .  mouth rinse  15 mL Mouth Rinse QID  . metoprolol tartrate  25 mg Per Tube BID  . multivitamin  15 mL Oral Daily  . neomycin-bacitracin-polymyxin   Topical Once  . pantoprazole (PROTONIX) IV  40 mg Intravenous Q12H  . thiamine injection  100 mg Intravenous Daily   Continuous Infusions: . sodium chloride 250 mL (07/29/17 0600)  . feeding supplement (OSMOLITE 1.5 CAL) 1,000 mL (07/29/17 0932)  . fluconazole (DIFLUCAN) IV Stopped (07/29/17 0932)   PRN Meds:.sodium chloride, acetaminophen, fentaNYL (SUBLIMAZE) injection, hydrALAZINE, metoprolol tartrate, ondansetron **OR** ondansetron (ZOFRAN) IV, sodium chloride flush    Anti-infectives (From admission, onward)   Start     Dose/Rate Route Frequency Ordered Stop   07/29/17 0800  fluconazole (DIFLUCAN) IVPB 100 mg  Status:  Discontinued     100 mg 50 mL/hr over 60 Minutes Intravenous Every 24 hours 07/28/17 0840 07/28/17 0841   07/29/17 0800  fluconazole (DIFLUCAN) IVPB 100 mg     100 mg 50 mL/hr over 60 Minutes Intravenous Every 24 hours 07/28/17 0841 08/05/17 0759   07/28/17 0845  fluconazole (DIFLUCAN) IVPB 200 mg     200 mg 100 mL/hr over 60 Minutes Intravenous  Once 07/28/17 0840 07/28/17 1030   07/27/17 1000  fluconazole (DIFLUCAN) 40 MG/ML suspension 200 mg  Status:  Discontinued     200 mg Per Tube Daily 07/26/17 0925 07/26/17 1100   07/26/17 1000  fluconazole (DIFLUCAN) 40 MG/ML suspension 400 mg  Status:  Discontinued     400 mg Per Tube  Once 07/26/17 0924 07/26/17 1100   07/20/17 1400  ertapenem (INVANZ) 1 g in sodium chloride 0.9 % 50 mL IVPB  Status:  Discontinued     1 g 100 mL/hr over 30 Minutes Intravenous Every 24 hours 07/20/17 1209 07/29/17 0940   07/16/17 0000  ceFAZolin (ANCEF) IVPB 2g/100 mL premix     2 g 200 mL/hr over 30 Minutes Intravenous Every 8 hours 07/15/17 1833 07/16/17 0054   07/15/17 0945  amoxicillin-clavulanate (AUGMENTIN) 250-62.5 MG/5ML suspension 500 mg     500 mg Oral Every 8 hours 07/15/17  0938 07/19/17 0928   07/15/17 0600  ceFAZolin (ANCEF) IVPB 2g/100 mL premix     2 g 200 mL/hr over 30 Minutes Intravenous On call to O.R. 07/14/17 1809 07/15/17 1632   07/11/17 1600  ampicillin-sulbactam (UNASYN) 1.5 g in sodium chloride 0.9 % 50 mL IVPB  Status:  Discontinued     1.5 g 100 mL/hr over 30 Minutes Intravenous Every 6 hours 07/11/17 1329 07/15/17 0940   07/08/17 1000  cefTRIAXone (ROCEPHIN) 1 g in dextrose 5 % 50 mL IVPB  Status:  Discontinued     1 g 100 mL/hr over 30 Minutes Intravenous Every 24 hours 07/08/17 0954 07/11/17 1315   07/08/17 1000  azithromycin (ZITHROMAX) 500 mg in dextrose 5 % 250 mL IVPB  Status:  Discontinued     500 mg 250 mL/hr over 60 Minutes Intravenous Every 24 hours 07/08/17 0954 07/11/17 1315  Objective:   Vitals:   07/29/17 1200 07/29/17 1400 07/29/17 1600 07/29/17 1632  BP: (!) 100/52 (!) 90/44 126/75   Pulse:      Resp:      Temp: 98.6 F (37 C) 98.8 F (37.1 C) 98.8 F (37.1 C)   TempSrc:      SpO2: (!) 87% 94% 91% 94%  Weight:      Height:        Wt Readings from Last 3 Encounters:  07/29/17 63.1 kg (139 lb 1.8 oz)  06/29/17 57.2 kg (126 lb)  01/04/17 72.9 kg (160 lb 12.8 oz)     Intake/Output Summary (Last 24 hours) at 07/29/2017 1804 Last data filed at 07/29/2017 1700 Gross per 24 hour  Intake 3428.83 ml  Output 1150 ml  Net 2278.83 ml     Physical Exam  Gen:- Awake Alert, patient looks emanciated in no apparent distress  HEENT:- [place].AT, No sclera icterus Neck-Supple Neck,No JVD, Nose- [place] 2 L/min Lungs-diminished in bases CV- S1, S2 normal Abd-  +ve B.Sounds, Abd Soft, No tenderness, J-tube site is clean dry and intact    Extremity/Skin:- No  edema,    Neuro-no tremors, generalized weakness without new focal deficits Psych-affect is appropriate    Data Review:   Micro Results Recent Results (from the past 240 hour(s))  Culture, blood (routine x 2)     Status: None   Collection Time: 07/20/17  3:58 PM    Result Value Ref Range Status   Specimen Description BLOOD RIGHT ANTECUBITAL  Final   Special Requests   Final    BOTTLES DRAWN AEROBIC ONLY Blood Culture adequate volume   Culture   Final    NO GROWTH 5 DAYS Performed at [facility], [address], [place], [place] [address]    Report Status 07/25/2017 FINAL  Final  Culture, blood (routine x 2)     Status: None   Collection Time: 07/20/17  3:58 PM  Result Value Ref Range Status   Specimen Description BLOOD RIGHT HAND  Final   Special Requests   Final    BOTTLES DRAWN AEROBIC ONLY Blood Culture adequate volume   Culture   Final    NO GROWTH 5 DAYS Performed at [facility], [address], [place], [place] [address]    Report Status 07/25/2017 FINAL  Final  MRSA PCR Screening     Status: None   Collection Time: 07/20/17  6:40 PM  Result Value Ref Range Status   MRSA by PCR NEGATIVE NEGATIVE Final    Comment:        The GeneXpert MRSA Assay (FDA approved for NASAL specimens only), is one component of a comprehensive MRSA colonization surveillance program. It is not intended to diagnose MRSA infection nor to guide or monitor treatment for MRSA infections.   Culture, respiratory (NON-Expectorated)     Status: None   Collection Time: 07/20/17  7:51 PM  Result Value Ref Range Status   Specimen Description TRACHEAL ASPIRATE  Final   Special Requests NONE  Final   Gram Stain   Final    ABUNDANT WBC PRESENT, PREDOMINANTLY PMN FEW YEAST Performed at [facility], [address], [place], [place] [address]    Culture MODERATE CANDIDA ALBICANS  Final   Report Status 07/23/2017 FINAL  Final  Culture, bal-quantitative     Status: Abnormal   Collection Time: 07/23/17  9:30 AM  Result Value Ref Range Status   Specimen Description  BRONCHIAL ALVEOLAR LAVAGE  Final   Special Requests NONE  Final   Gram Stain   Final    ABUNDANT WBC PRESENT, PREDOMINANTLY PMN RARE SQUAMOUS EPITHELIAL CELLS PRESENT FEW BUDDING  YEAST SEEN Performed at [facility], [address], [place], [place] [address]    Culture 50,000 COLONIES/mL CANDIDA ALBICANS (A)  Final   Report Status 07/25/2017 FINAL  Final  Body fluid culture     Status: None (Preliminary result)   Collection Time: 07/26/17  1:00 PM  Result Value Ref Range Status   Specimen Description   Final    PLEURAL Performed at [facility], [address], [place], [place] [address]    Special Requests   Final    Normal Performed at [facility], [address], [place], [place] [address]    Gram Stain   Final    WBC PRESENT, PREDOMINANTLY PMN NO ORGANISMS SEEN CYTOSPIN SMEAR    Culture   Final    NO GROWTH 3 DAYS Performed at [facility], [address], [place], [place] [address]    Report Status PENDING  Incomplete    Radiology Reports Ct Abdomen Pelvis Wo Contrast  Result Date: 07/08/2017 CLINICAL DATA:  72 year old male with nausea vomiting. EXAM: CT CHEST, ABDOMEN AND PELVIS WITHOUT CONTRAST TECHNIQUE: Multidetector CT imaging of the chest, abdomen and pelvis was performed following the standard protocol without IV contrast. COMPARISON:  Radiograph of the chest abdomen pelvis dated 07/07/2017 FINDINGS: Evaluation of this exam is limited in the absence of intravenous contrast. CT CHEST FINDINGS Cardiovascular: There is no cardiomegaly. Coronary vascular calcification primarily involving the LAD. Small anterior pericardial effusion measuring 8 mm in thickness. There is mild atherosclerotic calcification of the thoracic aorta. The thoracic aorta and central pulmonary arteries are grossly unremarkable on this noncontrast CT. Mediastinum/Nodes: No hilar or mediastinal adenopathy. The esophagus contains a mixed density fluid content with areas of higher attenuation, likely residual oral contrast. Findings likely represent reflux or esophageal dysmotility and delayed emptying. Blood product is less likely.  Clinical correlation is recommended. Endoscopy may provide better evaluation of the esophagus if clinically indicated. Lungs/Pleura: There is emphysematous changes of the lungs. Patchy area of nodular airspace density primarily involving the right middle lobe and anterior right upper lobe most consistent with pneumonia. Clinical correlation and follow-up to resolution recommended. There is mild bronchiectatic changes. No pneumothorax or pleural effusion. Endobronchial content in the region of the carina and proximal portion of the left mainstem bronchus likely mucous plugging. Musculoskeletal: There is degenerative changes of the spine. Old healed left rib fractures. No acute osseous pathology. CT ABDOMEN PELVIS FINDINGS No intra-abdominal free air or free fluid. Hepatobiliary: The liver is unremarkable. No intrahepatic biliary ductal dilatation. There is probable sludge or small stones within the gallbladder. No pericholecystic fluid or evidence of acute cholecystitis by CT. Pancreas: Unremarkable. No pancreatic ductal dilatation or surrounding inflammatory changes. Spleen: Normal in size without focal abnormality. Adrenals/Urinary Tract: The adrenal glands are unremarkable. Multiple bilateral renal hypodense lesions are not characterized on this CT but appear to demonstrate fluid attenuation. Ultrasound may provide better characterisation. Punctate nonobstructing right renal calculi versus vascular calcification. There is no hydronephrosis or obstructing stone on either side. The visualized ureters and urinary bladder appear unremarkable. Stomach/Bowel: There is a small hiatal hernia. Oral contrast opacifies multiple loops of small bowel. There is no evidence of bowel obstruction or active inflammation. There is sigmoid diverticulosis without active inflammation. Normal  appendix. Vascular/Lymphatic: Advanced aortoiliac atherosclerotic disease. The IVC is grossly unremarkable. No portal venous gas. There is no  adenopathy. Reproductive: The prostate and seminal vesicles are grossly unremarkable. Other: None Musculoskeletal: Osteopenia with degenerative changes of the lower lumbar spine. No acute osseous pathology. IMPRESSION: 1. Patchy areas of nodular airspace density primarily in the right middle lobe and right upper lobe most consistent with pneumonia. Clinical correlation and follow-up to resolution after treatment recommended. 2. Emphysema.  No pleural effusion or pneumothorax. 3. Mucous plugging in the proximal left mainstem bronchus. 4. Sigmoid diverticulosis. No bowel obstruction or active inflammation. Normal appendix. 5. Bilateral renal hypodense lesions, incompletely characterized. Ultrasound may provide better evaluation. No hydronephrosis or obstructing stone. 6. Aortic Atherosclerosis (ICD10-I70.0) and Emphysema (ICD10-J43.9). Electronically Signed   By: Elgie Collard M.D.   On: 07/08/2017 05:47   Dg Chest 1 View  Result Date: 07/20/2017 CLINICAL DATA:  Shortness of breath, weakness. History of esophageal malignancy, COPD on home oxygen, former smoker. EXAM: CHEST 1 VIEW COMPARISON:  Chest x-ray of July 19, 2017 FINDINGS: There is persistent infiltrate at the right lung base. There no large pleural effusion. The left lung is clear. The heart and pulmonary vascularity are normal. There is mild tortuosity of the ascending thoracic aorta. The bony thorax is unremarkable. IMPRESSION: COPD. Right basilar pneumonia little changed from yesterday's study. Electronically Signed   By: David  [place] M.D.   On: 07/20/2017 15:12   Dg Abd 1 View  Result Date: 07/26/2017 CLINICAL DATA:  NG tube advancement EXAM: ABDOMEN - 1 VIEW COMPARISON:  07/25/2017 FINDINGS: NG tube looped in the distal esophagus. Right lower lobe opacity with small right pleural effusion. Skin staples overlying the upper abdomen. IMPRESSION: NG tube looped in the distal esophagus. These results will be called to the ordering clinician or  representative by the Radiologist Assistant, and communication documented in the PACS or zVision Dashboard. Electronically Signed   By: Charline Bills M.D.   On: 07/26/2017 09:32   Dg Abd 1 View  Result Date: 07/25/2017 CLINICAL DATA:  Status post NG tube placement today. EXAM: ABDOMEN - 1 VIEW COMPARISON:  CT abdomen and pelvis 07/20/2017. Single-view of the chest 07/25/2017. FINDINGS: The patient has 2 NG tubes in place. Tip and side-port of 1 of the tubes are in the stomach. Tip of the second tube is at the gastroesophageal junction and should be advanced 7-8 cm. Endotracheal tube is unchanged and in good position. Lungs are emphysematous. Right basilar airspace disease and effusion are unchanged. Heart size is normal. No pneumothorax. IMPRESSION: Two NG tubes are identified. One is in good position. Tip of the second is at gastroesophageal junction and should be advanced 7-8 cm. Endotracheal tube in good position. No change in a right effusion and airspace disease. Emphysema. Electronically Signed   By: Drusilla Kanner M.D.   On: 07/25/2017 10:20   Ct Chest Wo Contrast  Result Date: 07/08/2017 CLINICAL DATA:  72 year old male with nausea vomiting. EXAM: CT CHEST, ABDOMEN AND PELVIS WITHOUT CONTRAST TECHNIQUE: Multidetector CT imaging of the chest, abdomen and pelvis was performed following the standard protocol without IV contrast. COMPARISON:  Radiograph of the chest abdomen pelvis dated 07/07/2017 FINDINGS: Evaluation of this exam is limited in the absence of intravenous contrast. CT CHEST FINDINGS Cardiovascular: There is no cardiomegaly. Coronary vascular calcification primarily involving the LAD. Small anterior pericardial effusion measuring 8 mm in thickness. There is mild atherosclerotic calcification of the thoracic aorta. The thoracic aorta and central pulmonary arteries  are grossly unremarkable on this noncontrast CT. Mediastinum/Nodes: No hilar or mediastinal adenopathy. The esophagus  contains a mixed density fluid content with areas of higher attenuation, likely residual oral contrast. Findings likely represent reflux or esophageal dysmotility and delayed emptying. Blood product is less likely. Clinical correlation is recommended. Endoscopy may provide better evaluation of the esophagus if clinically indicated. Lungs/Pleura: There is emphysematous changes of the lungs. Patchy area of nodular airspace density primarily involving the right middle lobe and anterior right upper lobe most consistent with pneumonia. Clinical correlation and follow-up to resolution recommended. There is mild bronchiectatic changes. No pneumothorax or pleural effusion. Endobronchial content in the region of the carina and proximal portion of the left mainstem bronchus likely mucous plugging. Musculoskeletal: There is degenerative changes of the spine. Old healed left rib fractures. No acute osseous pathology. CT ABDOMEN PELVIS FINDINGS No intra-abdominal free air or free fluid. Hepatobiliary: The liver is unremarkable. No intrahepatic biliary ductal dilatation. There is probable sludge or small stones within the gallbladder. No pericholecystic fluid or evidence of acute cholecystitis by CT. Pancreas: Unremarkable. No pancreatic ductal dilatation or surrounding inflammatory changes. Spleen: Normal in size without focal abnormality. Adrenals/Urinary Tract: The adrenal glands are unremarkable. Multiple bilateral renal hypodense lesions are not characterized on this CT but appear to demonstrate fluid attenuation. Ultrasound may provide better characterisation. Punctate nonobstructing right renal calculi versus vascular calcification. There is no hydronephrosis or obstructing stone on either side. The visualized ureters and urinary bladder appear unremarkable. Stomach/Bowel: There is a small hiatal hernia. Oral contrast opacifies multiple loops of small bowel. There is no evidence of bowel obstruction or active inflammation.  There is sigmoid diverticulosis without active inflammation. Normal appendix. Vascular/Lymphatic: Advanced aortoiliac atherosclerotic disease. The IVC is grossly unremarkable. No portal venous gas. There is no adenopathy. Reproductive: The prostate and seminal vesicles are grossly unremarkable. Other: None Musculoskeletal: Osteopenia with degenerative changes of the lower lumbar spine. No acute osseous pathology. IMPRESSION: 1. Patchy areas of nodular airspace density primarily in the right middle lobe and right upper lobe most consistent with pneumonia. Clinical correlation and follow-up to resolution after treatment recommended. 2. Emphysema.  No pleural effusion or pneumothorax. 3. Mucous plugging in the proximal left mainstem bronchus. 4. Sigmoid diverticulosis. No bowel obstruction or active inflammation. Normal appendix. 5. Bilateral renal hypodense lesions, incompletely characterized. Ultrasound may provide better evaluation. No hydronephrosis or obstructing stone. 6. Aortic Atherosclerosis (ICD10-I70.0) and Emphysema (ICD10-J43.9). Electronically Signed   By: Elgie Collard M.D.   On: 07/08/2017 05:47   Ct Abdomen Pelvis W Contrast  Result Date: 07/20/2017 CLINICAL DATA:  Recently diagnosed esophageal cancer. Abdominal distention. Recent jejunostomy (5 days postop) tube with fluid leak concerning for ascites EXAM: CT ABDOMEN AND PELVIS WITH CONTRAST TECHNIQUE: Multidetector CT imaging of the abdomen and pelvis was performed using the standard protocol following bolus administration of intravenous contrast. CONTRAST:  100 cc Isovue-300 intravenous COMPARISON:  07/08/2017 FINDINGS: Lower chest: Airspace opacity in the right lower lobe. Improved airspace disease in the right middle lobe. Suspect aspiration given the chronically distended esophagus above the GE junction mass. Small bilateral pleural effusion. Hepatobiliary: Geographic low-density in segment 5 and 8 of the liver without visible underlying  lesions seen. Negative gallbladder. Pancreas: Unremarkable. Spleen: Unremarkable. Adrenals/Urinary Tract: Negative adrenals. No hydronephrosis or stone. Moderate scarring involving the upper pole left renal cortex. Bilateral simple appearing renal cysts. Unremarkable bladder. Stomach/Bowel: Recent jejunostomy in unremarkable position. The bowel is kinked at the level of the enterotomy and there is  marked proximal small bowel and stomach distension. Small volume pneumoperitoneum which is expected after recent surgery. Distended lower esophagus above a GE junction mass. Colonic diverticulosis. Vascular/Lymphatic: Atherosclerosis with advanced atheromatous wall thickening of the aorta. No definite adenopathy. PET-CT would be more sensitive in this setting. Reproductive:No pathologic findings. Other: No ascites or pneumoperitoneum. Musculoskeletal: No acute abnormalities. A call has been placed to the ordering provider. IMPRESSION: 1. High-grade proximal small bowel obstruction with transition at the jejunostomy tube. There is a degree of closed loop physiology given the GE junction mass and severe stenosis. 2. Dilated esophagus above the GE junction mass. There is shifting opacity at the right base concerning for recurrent aspiration. 3. Small pleural effusions. 4. Perfusion anomaly in the ventral right lobe liver without visible underlying cause. Attention on future staging scans. Electronically Signed   By: Marnee Spring M.D.   On: 07/20/2017 15:07   Dg Esophagus  Result Date: 07/11/2017 CLINICAL DATA:  Persistent dysphagia, nausea/vomiting, unable to tolerate more than a tablespoon of food at a time, decreased appetite EXAM: ESOPHOGRAM/BARIUM SWALLOW TECHNIQUE: Single contrast examination was performed using  thin barium. FLUOROSCOPY TIME:  Fluoroscopy Time:  3.1 minutes Radiation Exposure Index (if provided by the fluoroscopic device): 1.9 mGy Number of Acquired Spot Images: 5 COMPARISON:  CT chest abdomen  pelvis dated 07/08/2017 FINDINGS: Moderate esophageal dysmotility with fluid/debris in the mid/distal esophagus. Marked narrowing/stricture of the distal esophagus just above the GE junction. After significant delay, trace contrast passed into the proximal stomach. When correlating with the recent CT, there is no evidence of esophageal mass. However, a fat containing hernia at the esophageal hiatus is present, possibly leading to extrinsic compression. IMPRESSION: Severe stricture of the distal esophagus just above the GE junction. Secondary esophageal dysmotility. When correlating with recent CT, there is no evidence of esophageal mass. Possible extrinsic compression secondary to a fat containing hernia. Consider endoscopy for further evaluation. Electronically Signed   By: Charline Bills M.D.   On: 07/11/2017 10:21   US Renal  Result Date: 07/09/2017 CLINICAL DATA:  Elevated creatinine EXAM: RENAL / URINARY TRACT ULTRASOUND COMPLETE COMPARISON:  CT 07/08/2017 FINDINGS: Right Kidney: Length: 10.6 cm. Echogenicity within normal limits. No mass or hydronephrosis visualized. Left Kidney: Length: 10.3 cm. Multiple cysts in the left kidney, the largest 4.5 cm. These appear benign. No hydronephrosis. Bladder: Appears normal for degree of bladder distention. IMPRESSION: Left renal cysts. No acute findings.  No hydronephrosis. Electronically Signed   By: Charlett Nose M.D.   On: 07/09/2017 15:56   Dg Abdomen Peg Tube Location  Result Date: 07/19/2017 CLINICAL DATA:  Jejunostomy tube leaking. EXAM: ABDOMEN - 1 VIEW COMPARISON:  07/19/2016. FINDINGS: KUB following nonionic contrast administration into enterostomy tube obtained. Enterostomy tube tip noted within a right-sided abdominal small-bowel loop. No definite leakage noted. Contrast from prior study noted in the colon. IMPRESSION: Enterostomy tube tip noted in a small-bowel loop in the right abdomen. Electronically Signed   By: Maisie Fus  Register   On:  07/19/2017 16:41   Dg Chest Port 1 View  Result Date: 07/27/2017 CLINICAL DATA:  Atelectasis. EXAM: PORTABLE CHEST 1 VIEW COMPARISON:  Radiographs of July 26, 2016. FINDINGS: Stable cardiomediastinal silhouette. Stable right lower lobe opacity is noted concerning for pneumonia. No pneumothorax is noted. Mild left basilar subsegmental atelectasis is noted. Bony thorax is unremarkable. IMPRESSION: Stable right lower lobe pneumonia. Electronically Signed   By: Lupita Raider, M.D.   On: 07/27/2017 08:07   Dg Chest [facility]  1 View  Result Date: 07/26/2017 CLINICAL DATA:  Thoracentesis. EXAM: PORTABLE CHEST 1 VIEW COMPARISON:  One-view chest x-ray from the same day. FINDINGS: The heart size is normal. Interstitial and airspace disease at the right lung base is slightly improved. There is no pneumothorax. Mild left base airspace disease present. Emphysematous changes are noted. The NG tube was removed. IMPRESSION: 1. Persistent right lower lobe interstitial and airspace disease concerning for pneumonia. 2. Partial clearing of the right upper lobe. 3. Mild atelectasis left base. 4. COPD. 5. Interval removal of NG tube. Electronically Signed   By: Marin Roberts M.D.   On: 07/26/2017 14:03   Dg Chest Port 1 View  Result Date: 07/26/2017 CLINICAL DATA:  72 year old male with obstructing esophageal cancer. Postoperative day 6 status post abdominal surgery for small bowel obstruction at a jejunostomy tube site. EXAM: PORTABLE CHEST 1 VIEW COMPARISON:  07/25/2017 and earlier. FINDINGS: Portable AP semi upright view at 0454 hours. Extubated. Enteric tube remains in place, but is looped in the distal esophagus level. Yesterday the tube appear to course into the stomach. Continued volume loss in the right lung with mild rightward shift of the mediastinum. Superimposed veiling right lung opacity suggesting pleural effusion. Large left lung volume. The left lung remains clear. Stable mediastinal contours.  IMPRESSION: 1. Enteric tube now is looped in the distal esophagus, whereas yesterday tube appeared to course into the stomach. 2. Extubated. 3. Suspect combined lower lobe collapse and pleural effusion in the right lung. Consider mucous plug. 4. The left lung remains clear. Electronically Signed   By: Odessa Fleming M.D.   On: 07/26/2017 07:41   Dg Chest Port 1 View  Result Date: 07/25/2017 CLINICAL DATA:  Respiratory failure EXAM: PORTABLE CHEST 1 VIEW COMPARISON:  Portable chest x-ray of July 24, 2017 FINDINGS: There is increased density in the right mid and lower lung as compared to yesterday's study. The left lung is clear. The heart and pulmonary vascularity are normal. The endotracheal tube tip lies approximately 3.8 cm above the carina. The esophagogastric tube tip in proximal port project below the GE junction. IMPRESSION: Increased density on the right consistent with worsening atelectasis or pneumonia and likely a posterior layering pleural effusion. Underlying COPD. The support tubes are in reasonable position. Electronically Signed   By: David  [place] M.D.   On: 07/25/2017 06:55   Dg Chest Port 1 View  Result Date: 07/24/2017 CLINICAL DATA:  Follow-up ventilator support. EXAM: PORTABLE CHEST 1 VIEW COMPARISON:  07/23/2017 FINDINGS: Endotracheal tube tip is 4 cm above the carina. Nasogastric tube enters the abdomen. Background emphysema. Left lung is otherwise clear. Persistent pneumonia and volume loss in the right lower lung. I think there is slight radiographic improvement. IMPRESSION: Slight improvement in lower right lung pneumonia. Electronically Signed   By: Paulina Fusi M.D.   On: 07/24/2017 06:53   Dg Chest Port 1 View  Result Date: 07/23/2017 CLINICAL DATA:  Post intubation and enteric tube placement EXAM: PORTABLE CHEST 1 VIEW COMPARISON:  07/22/2017 FINDINGS: Endotracheal tube placed with tip measuring 3 cm above the carina. Enteric tube tip is coiled in the left upper quadrant  consistent with location in the upper stomach. Volume loss and consolidation in the right lung with hyperinflation of the left lung. Probable right pleural effusion. Right lung changes are progressing since the previous study. No pneumothorax. IMPRESSION: Appliances appear in satisfactory position. Progression of consolidation, effusion, and volume loss in the right lung since previous study. Compensatory hyperinflation of  the left lung. Electronically Signed   By: Burman Nieves M.D.   On: 07/23/2017 03:51   Dg Chest Port 1 View  Result Date: 07/22/2017 CLINICAL DATA:  Status postextubation.  Airspace consolidation. EXAM: PORTABLE CHEST 1 VIEW COMPARISON:  July 21, 2017 FINDINGS: Endotracheal tube and nasogastric tube have been removed. No pneumothorax. There is airspace consolidation throughout the right mid lower lung zones, increased from 1 day prior. There is a small right pleural effusion. The left lung is somewhat hyperexpanded. There is no edema or consolidation on the left. Heart size and pulmonary vascular normal. No adenopathy appreciable. Old healed fractures noted on the left. IMPRESSION: No pneumothorax. Increase in airspace consolidation in the right mid and lower lung zones with small right pleural effusion. Question progression of pneumonia versus aspiration. Both entities may be present concurrently. Left lung hyperexpanded but clear. Stable cardiac silhouette. Electronically Signed   By: Bretta Bang III M.D.   On: 07/22/2017 07:32   Portable Chest Xray  Result Date: 07/21/2017 CLINICAL DATA:  Endotracheal tube positioning EXAM: PORTABLE CHEST 1 VIEW COMPARISON:  Yesterday FINDINGS: Endotracheal tube tip between the clavicular heads and carina. An orogastric tube reaches the stomach. Lung opacity at the right more than left base. Small pleural effusions by abdominal CT yesterday. Large lung volumes compatible with COPD. IMPRESSION: 1. Stable positioning of endotracheal and  orogastric tubes. 2. Small pleural effusions and right more than left lower lobe opacity. Clinical circumstances primarily concerning for aspiration pneumonitis/pneumonia. Electronically Signed   By: Marnee Spring M.D.   On: 07/21/2017 07:07   Portable Chest X-ray  Result Date: 07/20/2017 CLINICAL DATA:  72 year old male with a history of endotracheal tube placement EXAM: PORTABLE CHEST 1 VIEW COMPARISON:  07/20/2017 abdominal CT 07/20/2017 FINDINGS: Cardiomediastinal silhouette unchanged in size and contour. Interval placement of endotracheal tube, terminating suitably above the carina approximately 4 cm. Interval placement of gastric tube, terminating out of the field of view. Similar appearance of bibasilar opacities, more pronounced on the right. No pneumothorax IMPRESSION: Interval placement of endotracheal tube, terminating suitably above the carina. Interval placement of gastric tube, terminating out of the field of view. Similar appearance of right greater than left airspace disease Electronically Signed   By: Gilmer Mor D.O.   On: 07/20/2017 20:01   Dg Chest Port 1 View  Result Date: 07/19/2017 CLINICAL DATA:  Shortness of breath. EXAM: PORTABLE CHEST 1 VIEW COMPARISON:  CT 07/08/2016.  Chest x-ray 01/13/2016. FINDINGS: Mediastinum hilar structures normal. Cardiomegaly with normal pulmonary vascularity. COPD. Right base infiltrate with small right pleural effusion. IMPRESSION: 1. Right base infiltrate consistent with pneumonia. Small right pleural effusion. Similar findings noted on prior CT of 07/08/2016. 2.  COPD. Electronically Signed   By: Maisie Fus  Register   On: 07/19/2017 13:51   Dg Abd Acute W/chest  Result Date: 07/08/2017 CLINICAL DATA:  Vomiting for 10 days.  Weight loss. EXAM: DG ABDOMEN ACUTE W/ 1V CHEST COMPARISON:  Chest radiograph 01/13/2016 FINDINGS: The lungs are hyperexpanded with multiple areas of increased lucency. No focal airspace consolidation or pulmonary edema. No  pneumothorax or sizable pleural effusion. No free intraperitoneal air. No dilated loops of bowel are visible. The round densities overlying the left psoas muscle may be within the transverse colon. IMPRESSION: 1. COPD without acute airspace disease. 2. No free intraperitoneal air or evidence of small-bowel obstruction. Electronically Signed   By: Deatra Robinson M.D.   On: 07/08/2017 00:07   Dg Abd Portable 1v  Result Date:  07/27/2017 CLINICAL DATA:  Gastroesophageal ca, f/u feeding tube placement, some fullness feeling in stomach EXAM: PORTABLE ABDOMEN - 1 VIEW COMPARISON:  Radiograph 129 19 FINDINGS: Enteric tube projects over the RIGHT lower quadrant. No dilated large or small bowel. Small amount of high-density stool remains in rectum IMPRESSION: No bowel obstruction Electronically Signed   By: Genevive Bi M.D.   On: 07/27/2017 10:13   Dg Abd Portable 1v  Result Date: 07/19/2017 CLINICAL DATA:  COPD, nausea vomiting EXAM: PORTABLE ABDOMEN - 1 VIEW COMPARISON:  None. FINDINGS: No dilated large or small bowel. Jejunostomy tube with tip in the RIGHT lower quadrant. Midline skin staples noted. No evidence bowel obstruction. Oral contrast from prior CT within the colon IMPRESSION: 1. No evidence of bowel obstruction. 2. Jejunostomy tube in place. Electronically Signed   By: Genevive Bi M.D.   On: 07/19/2017 10:34   Dg Kayleen Memos W/o Kub  Result Date: 07/26/2017 CLINICAL DATA:  72 year old male with esophageal cancer. Subsequent encounter. EXAM: UPPER GI SERIES WITHOUT KUB TECHNIQUE: Routine upper GI series was performed with thin barium. FLUOROSCOPY TIME:  Fluoroscopy Time:  1 minute and 48 seconds Radiation Exposure Index: 5.6 mGy COMPARISON:  07/26/2017 chest x-ray.  07/20/2017 CT. FINDINGS: Patient's recent nasogastric tube was removed by nursing staff prior to coming to radiology. Request for placing nasogastric tube and performing upper GI series and small-bowel follow-through. Procedure discussed  with patient. Lidocaine gel utilized. Nasogastric tube gently advanced. Not able to advance into the stomach. Barium instilled. Barium did not traverses into the stomach. Barium was aspirated and nasogastric tube removed. IMPRESSION: Unsuccessful placement of nasogastric tube. Not able to performed upper GI series and small-bowel follow-through as noted above. Results discussed with patient's nurse with request to call covering physician with results. Electronically Signed   By: Lacy Duverney M.D.   On: 07/26/2017 11:48     CBC Recent Labs  Lab 07/24/17 0321 07/25/17 0333 07/26/17 0119 07/27/17 0307 07/28/17 0318  WBC 19.3* 15.5* 17.4* 14.0* 13.5*  HGB 12.3* 12.5* 12.8* 11.7* 11.3*  HCT 36.7* 38.0* 39.3 34.5* 34.6*  PLT 266 254 305 299 236  MCV 90.0 89.2 89.9 88.9 89.2  MCH 30.1 29.3 29.3 30.2 29.1  MCHC 33.5 32.9 32.6 33.9 32.7  RDW 16.2* 16.4* 16.3* 16.2* 16.3*    Chemistries  Recent Labs  Lab 07/24/17 0321 07/24/17 2259 07/25/17 0333 07/26/17 0119 07/27/17 0307 07/28/17 0318  NA 141 141 141 138 140 140  K 3.9 3.8 3.9 4.2 3.8 3.8  CL 106 107 108 103 105 103  CO2 29 29 28 28  32 33*  GLUCOSE 130* 132* 113* 87 166* 107*  BUN 11 10 11 10 14 13   CREATININE 0.79 0.69 0.67 0.61 0.78 0.71  CALCIUM 8.2* 8.0* 7.9* 8.3* 8.0* 7.7*  MG 1.9 1.9 1.8 1.6* 2.4  --   AST  --   --   --  16 36 41  ALT  --   --   --  15* 22 33  ALKPHOS  --   --   --  77 94 99  BILITOT  --   --   --  0.8 0.5 0.3   ------------------------------------------------------------------------------------------------------------------ No results for input(s): CHOL, HDL, LDLCALC, TRIG, CHOLHDL, LDLDIRECT in the last 72 hours.  No results found for: HGBA1C ------------------------------------------------------------------------------------------------------------------ No results for input(s): TSH, T4TOTAL, T3FREE, THYROIDAB in the last 72 hours.  Invalid input(s):  FREET3 ------------------------------------------------------------------------------------------------------------------ No results for input(s): VITAMINB12, FOLATE, FERRITIN, TIBC, IRON, RETICCTPCT in  the last 72 hours.  Coagulation profile No results for input(s): INR, PROTIME in the last 168 hours.  No results for input(s): DDIMER in the last 72 hours.  Cardiac Enzymes Recent Labs  Lab 07/25/17 1344 07/25/17 1916 07/26/17 0119  TROPONINI <0.03 <0.03 <0.03   ------------------------------------------------------------------------------------------------------------------ No results found for: BNP   Rajat Staver M.D on 07/29/2017 at 6:04 PM  Between 7am to 7pm - Pager - [phone]  After 7pm go to [email or web address] - password [email or web address]  [facility] -  Office  [phone]   Voice Recognition Reubin Milan dictation system was used to create this note, attempts have been made to correct errors. Please contact the author with questions and/or clarifications.

## 2017-07-29 NOTE — Progress Notes (Signed)
9 Days Post-Op    FI:EPPIRJJOAC mass/stenosis/obstruction    Subjective: Tube feeds are up to goal and he seems comfortable with this. Abdominal incision and J tube site look fine.    Objective: Vital signs in last 24 hours: Temp:  [98.2 F (36.8 C)-99.5 F (37.5 C)] 98.2 F (36.8 C) (02/01 0600) Resp:  [14-24] 19 (02/01 0600) BP: (96-137)/(48-73) 117/59 (02/01 0600) SpO2:  [89 %-100 %] 89 % (02/01 0600) Weight:  [63.1 kg (139 lb 1.8 oz)] 63.1 kg (139 lb 1.8 oz) (02/01 0600) Last BM Date: 07/26/17 465 IV 2113 jejunostomy intake 975 urine Afebrile, VSS Labs OK  Intake/Output from previous day: 01/31 0701 - 02/01 0700 In: 2578.8 [I.V.:465.3; NG/GT:2113.5] Out: 975 [Urine:975] Intake/Output this shift: No intake/output data recorded.  General appearance: alert, cooperative and no distress GI: soft, sore, sites OK, J tube OK  Lab Results:  Recent Labs    07/27/17 0307 07/28/17 0318  WBC 14.0* 13.5*  HGB 11.7* 11.3*  HCT 34.5* 34.6*  PLT 299 236    BMET Recent Labs    07/27/17 0307 07/28/17 0318  NA 140 140  K 3.8 3.8  CL 105 103  CO2 32 33*  GLUCOSE 166* 107*  BUN 14 13  CREATININE 0.78 0.71  CALCIUM 8.0* 7.7*   PT/INR No results for input(s): LABPROT, INR in the last 72 hours.  Recent Labs  Lab 07/26/17 0119 07/26/17 1357 07/27/17 0307 07/28/17 0318  AST 16  --  36 41  ALT 15*  --  22 33  ALKPHOS 77  --  94 99  BILITOT 0.8  --  0.5 0.3  PROT 4.7* 5.0* 4.4* 4.6*  ALBUMIN 1.8*  --  1.6* 1.7*     Lipase     Component Value Date/Time   LIPASE 39 07/12/2017 0342     Medications: . chlorhexidine gluconate (MEDLINE KIT)  15 mL Mouth Rinse BID  . Chlorhexidine Gluconate Cloth  6 each Topical Daily  . folic acid  1 mg Intravenous Daily  . free water  50 mL Per Tube Q4H  . heparin  5,000 Units Subcutaneous Q8H  . ipratropium-albuterol  3 mL Nebulization QID  . mouth rinse  15 mL Mouth Rinse QID  . metoprolol tartrate  25 mg Per Tube BID   . multivitamin  15 mL Oral Daily  . neomycin-bacitracin-polymyxin   Topical Once  . pantoprazole (PROTONIX) IV  40 mg Intravenous Q12H  . 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 Dalbert Batman 1/23 CT showed kink in small bowel at the level of jejunostomy causing SBO S/p ex-lap with revision of jejunostomy 1/23 Dr. Excell Seltzer -continue tube feeds, can advance as tolerated, they are off now after extubation per ccm -ugi today to ensure patency of gi tract through j tube -when done in icu plan was to transfer back to [facility] -failed placement of NG - failed UGI attempt - GI to try EGD this week  ID -ertapenem 1/23 =>> day 8  Diflucan 1/29 >> day 2 FEN -NPO, IVF/Tube feeding at goal 60 ml/hr. VTE -SCDs, SQ heparin Foley:  in Follow up -Dr. Dalbert Batman   Plan:  Pt is up to goal on tube feeds. They are planning chemotherapy and radiation therapy.  We will see him again next week and  plan to DC staples on 08/03/17.  We will be available as needed over the weekend. Be sure they do not put any crushed medicines into the Jejunostomy tube.  Nothing but liquids and tube feeds.  LOS: 21 days    Jariana Shumard 07/29/2017 [phone]

## 2017-07-30 LAB — BASIC METABOLIC PANEL
Anion gap: 7 (ref 5–15)
BUN: 15 mg/dL (ref 6–20)
CHLORIDE: 96 mmol/L — AB (ref 101–111)
CO2: 37 mmol/L — ABNORMAL HIGH (ref 22–32)
CREATININE: 0.61 mg/dL (ref 0.61–1.24)
Calcium: 7.9 mg/dL — ABNORMAL LOW (ref 8.9–10.3)
GFR calc Af Amer: >60 mL/min (ref >60)
GFR calc non Af Amer: >60 mL/min (ref >60)
Glucose, Bld: 148 mg/dL — ABNORMAL HIGH (ref 65–99)
Potassium: 4.3 mmol/L (ref 3.5–5.1)
SODIUM: 140 mmol/L (ref 135–145)

## 2017-07-30 LAB — CBC
HCT: 32.8 % — ABNORMAL LOW (ref 39.0–52.0)
HEMOGLOBIN: 10.8 g/dL — AB (ref 13.0–17.0)
MCH: 30.1 pg (ref 26.0–34.0)
MCHC: 32.9 g/dL (ref 30.0–36.0)
MCV: 91.4 fL (ref 78.0–100.0)
Platelets: 297 10*3/uL (ref 150–400)
RBC: 3.59 MIL/uL — ABNORMAL LOW (ref 4.22–5.81)
RDW: 16.2 % — AB (ref 11.5–15.5)
WBC: 12.5 10*3/uL — ABNORMAL HIGH (ref 4.0–10.5)

## 2017-07-30 LAB — BODY FLUID CULTURE
CULTURE: NO GROWTH
Special Requests: NORMAL

## 2017-07-30 MED ORDER — IPRATROPIUM-ALBUTEROL 0.5-2.5 (3) MG/3ML IN SOLN
3.0000 mL | Freq: Three times a day (TID) | RESPIRATORY_TRACT | Status: DC
  Administered 2017-07-30 – 2017-08-02 (×9): 3 mL via RESPIRATORY_TRACT
  Filled 2017-07-30 (×9): qty 3

## 2017-07-30 MED ORDER — INDOMETHACIN 25 MG PO CAPS
50.0000 mg | ORAL_CAPSULE | Freq: Three times a day (TID) | ORAL | Status: DC | PRN
  Administered 2017-07-30 (×3): 50 mg via ORAL
  Filled 2017-07-30 (×4): qty 2

## 2017-07-30 NOTE — Progress Notes (Signed)
Holding CPT at this time. Patient is nauseated. Will resume at next scheduled time as tolerated by patient. RT will continue to monitor patient.

## 2017-07-30 NOTE — Progress Notes (Signed)
Patient Demographics:    George Stokes, is a 72 y.o. male, DOB - 1946/05/22, GEX:[id number]  Admit date - 07/07/2017   Admitting Physician Hillary Bow, DO  Outpatient Primary MD for the patient is Patient, No Pcp Per  LOS - 22  Chief Complaint  Patient presents with  . Emesis        Subjective:    [name] today has no fevers, no emesis,  No chest pain,  Was spitting up white phlegm earlier, No frank emesis  Assessment  & Plan :    Principal Problem:   Adenocarcinoma of esophagus (HCC) Active Problems:   Essential hypertension   COPD with hypoxia (HCC)   Chronic respiratory failure with hypoxia (HCC)   Intractable nausea and vomiting   CAP (community acquired pneumonia)   Lobar pneumonia (HCC)   Hypomagnesemia   Hypokalemia   Protein-calorie malnutrition, severe (HCC)   Esophageal dysphagia   Esophageal mass   Esophageal stricture   Esophageal cancer (HCC)   Malnutrition of moderate degree   H/O exploratory laparotomy   Acute respiratory failure (HCC)   Atelectasis of right lung  Brief Summary:- 72 y.o. male reformed smoker with COPD, Chronic Hypoxic Resp Failure  on 4 liters , HTN and history of alcohol abuse admitted on 07/08/17 with N/V and a 20 pound weight loss.  S/P J tube placement, with complicated course due to SBO and leakage out of J tube. S/P Ex-Lap and jejunostomy revision. EGD performed 07/13/17 found malignant appearing esophageal stenosis/ annular adenocarcinoma (pathology/cytology was not definitive for malignancy), awaiting radiation and chemotherapy,  On 07/15/17  Had placement of a jejunostomy feeding tube ,  He developed an obstruction at the site of the feeding tube 07/20/2017 and was taken to the operating room for revision.  Following this procedure he had prolonged intubation and was extubated 07/25/2017. He underwent a right thoracentesis 07/26/2017, he cytology  revealed acute inflammation (no malignant cells)     Plan:- 1)GE junction malignant esophageal stricture/ annular adenocarcinoma-input from Dr Christella Hartigan (Gi) and Dr Truett Perna (oncology) appreciated, please note there is no definitive/confirmatory histological diagnosis at this time. Pt is to start XRT soon, plan for chemoRx week of 08/01/17,  w/ XRT the esophageal stricture may improve. Would continue strict NPO, continue  Protonix  2)FEN-  N.p.o.,c/n Osmolite 1.5 @ 60 mL/hr via J-tube which will provides 2160 kcal, 90 grams of protein, and 1097 mL free water.,  Continue 50 mL free water every 4 hours via J-tube (300 mL/day), c/n thiamine and folic acid/multivitamin the patient with history of alcohol abuse, no frank emesis  3) Candida respiratory infection- BAL cultures growing Candida albicans, discussed with Dr. Marchelle Gearing, okay to treat with Diflucan for 1 week (first dose 07/28/2017)  4)HTN-stable, continue metoprolol 25 mg twice daily  5)Social/Ethics- Full Code, plan of care discussed with patient and brother-in-law at bedside  6)Disposition-patient has significant post acute illness debility/weakness and poor endurance, physical therapy evaluation and social work consult appreciated, awaiting insurance approval for placement to skilled nursing facility rehab  7)Moderate to Severe Protein Caloric Malnutrition- Nutritional supplements as above #2  Consults  :  Gi/PCCMM/Oncology ([place])  Procedures:-  EGD from 07/13/2017 with malignant esophageal stricture (biopsy not conclusive) J-tube placed on 07/15/2017  Revision of J-tube on 07/20/2017 Initially extubated 07/21/2017 Reintubated 07/23/2017 Extubated 07/25/2017 NG tube placement by interventional radiology on 07/26/2017   DVT Prophylaxis  :  Heparin/ SCDs  Lab Results  Component Value Date   PLT 297 07/30/2017    Inpatient Medications  Scheduled Meds: . chlorhexidine gluconate (MEDLINE KIT)  15 mL Mouth Rinse BID  . Chlorhexidine  Gluconate Cloth  6 each Topical Daily  . folic acid  1 mg Intravenous Daily  . free water  50 mL Per Tube Q4H  . heparin  5,000 Units Subcutaneous Q8H  . ipratropium-albuterol  3 mL Nebulization TID  . mouth rinse  15 mL Mouth Rinse QID  . metoprolol tartrate  25 mg Per Tube BID  . multivitamin  15 mL Oral Daily  . neomycin-bacitracin-polymyxin   Topical Once  . pantoprazole (PROTONIX) IV  40 mg Intravenous Q12H  . thiamine injection  100 mg Intravenous Daily   Continuous Infusions: . sodium chloride 250 mL (07/29/17 0600)  . feeding supplement (OSMOLITE 1.5 CAL) 1,000 mL (07/30/17 0149)  . fluconazole (DIFLUCAN) IV Stopped (07/30/17 1020)   PRN Meds:.sodium chloride, acetaminophen, fentaNYL (SUBLIMAZE) injection, hydrALAZINE, indomethacin, metoprolol tartrate, ondansetron **OR** ondansetron (ZOFRAN) IV, sodium chloride flush    Anti-infectives (From admission, onward)   Start     Dose/Rate Route Frequency Ordered Stop   07/29/17 0800  fluconazole (DIFLUCAN) IVPB 100 mg  Status:  Discontinued     100 mg 50 mL/hr over 60 Minutes Intravenous Every 24 hours 07/28/17 0840 07/28/17 0841   07/29/17 0800  fluconazole (DIFLUCAN) IVPB 100 mg     100 mg 50 mL/hr over 60 Minutes Intravenous Every 24 hours 07/28/17 0841 08/05/17 0759   07/28/17 0845  fluconazole (DIFLUCAN) IVPB 200 mg     200 mg 100 mL/hr over 60 Minutes Intravenous  Once 07/28/17 0840 07/28/17 1030   07/27/17 1000  fluconazole (DIFLUCAN) 40 MG/ML suspension 200 mg  Status:  Discontinued     200 mg Per Tube Daily 07/26/17 0925 07/26/17 1100   07/26/17 1000  fluconazole (DIFLUCAN) 40 MG/ML suspension 400 mg  Status:  Discontinued     400 mg Per Tube  Once 07/26/17 0924 07/26/17 1100   07/20/17 1400  ertapenem (INVANZ) 1 g in sodium chloride 0.9 % 50 mL IVPB  Status:  Discontinued     1 g 100 mL/hr over 30 Minutes Intravenous Every 24 hours 07/20/17 1209 07/29/17 0940   07/16/17 0000  ceFAZolin (ANCEF) IVPB 2g/100 mL premix      2 g 200 mL/hr over 30 Minutes Intravenous Every 8 hours 07/15/17 1833 07/16/17 0054   07/15/17 0945  amoxicillin-clavulanate (AUGMENTIN) 250-62.5 MG/5ML suspension 500 mg     500 mg Oral Every 8 hours 07/15/17 0938 07/19/17 0928   07/15/17 0600  ceFAZolin (ANCEF) IVPB 2g/100 mL premix     2 g 200 mL/hr over 30 Minutes Intravenous On call to O.R. 07/14/17 1809 07/15/17 1632   07/11/17 1600  ampicillin-sulbactam (UNASYN) 1.5 g in sodium chloride 0.9 % 50 mL IVPB  Status:  Discontinued     1.5 g 100 mL/hr over 30 Minutes Intravenous Every 6 hours 07/11/17 1329 07/15/17 0940   07/08/17 1000  cefTRIAXone (ROCEPHIN) 1 g in dextrose 5 % 50 mL IVPB  Status:  Discontinued     1 g 100 mL/hr over 30 Minutes Intravenous Every 24 hours 07/08/17 0954 07/11/17 1315   07/08/17 1000  azithromycin (ZITHROMAX) 500 mg in dextrose 5 % 250  mL IVPB  Status:  Discontinued     500 mg 250 mL/hr over 60 Minutes Intravenous Every 24 hours 07/08/17 0954 07/11/17 1315        Objective:   Vitals:   07/30/17 0800 07/30/17 0930 07/30/17 1000 07/30/17 1125  BP: (!) 143/64  (!) 110/57   Pulse:  84    Resp: 13 20 20    Temp: 98 F (36.7 C)   97.8 F (36.6 C)  TempSrc: Oral   Oral  SpO2: 95% 94% 94%   Weight:      Height:        Wt Readings from Last 3 Encounters:  07/30/17 63.2 kg (139 lb 5.3 oz)  06/29/17 57.2 kg (126 lb)  01/04/17 72.9 kg (160 lb 12.8 oz)     Intake/Output Summary (Last 24 hours) at 07/30/2017 1224 Last data filed at 07/30/2017 1100 Gross per 24 hour  Intake 1070 ml  Output 800 ml  Net 270 ml     Physical Exam  Gen:- Awake Alert, patient looks emanciated in no apparent distress  HEENT:- [place].AT, No sclera icterus Neck-Supple Neck,No JVD, Nose- Imbler 2 L/min Lungs-diminished in bases CV- S1, S2 normal Abd-  +ve B.Sounds, Abd Soft, No tenderness, J-tube site is clean dry and intact,    Extremity/Skin:- No  edema,    Neuro-no tremors, generalized weakness without new focal  deficits Psych-affect is appropriate    Data Review:   Micro Results Recent Results (from the past 240 hour(s))  Culture, blood (routine x 2)     Status: None   Collection Time: 07/20/17  3:58 PM  Result Value Ref Range Status   Specimen Description BLOOD RIGHT ANTECUBITAL  Final   Special Requests   Final    BOTTLES DRAWN AEROBIC ONLY Blood Culture adequate volume   Culture   Final    NO GROWTH 5 DAYS Performed at [facility], [address] [place], [place] [address]    Report Status 07/25/2017 FINAL  Final  Culture, blood (routine x 2)     Status: None   Collection Time: 07/20/17  3:58 PM  Result Value Ref Range Status   Specimen Description BLOOD RIGHT HAND  Final   Special Requests   Final    BOTTLES DRAWN AEROBIC ONLY Blood Culture adequate volume   Culture   Final    NO GROWTH 5 DAYS Performed at [facility], [address] [place], [place] [address]    Report Status 07/25/2017 FINAL  Final  MRSA PCR Screening     Status: None   Collection Time: 07/20/17  6:40 PM  Result Value Ref Range Status   MRSA by PCR NEGATIVE NEGATIVE Final    Comment:        The GeneXpert MRSA Assay (FDA approved for NASAL specimens only), is one component of a comprehensive MRSA colonization surveillance program. It is not intended to diagnose MRSA infection nor to guide or monitor treatment for MRSA infections.   Culture, respiratory (NON-Expectorated)     Status: None   Collection Time: 07/20/17  7:51 PM  Result Value Ref Range Status   Specimen Description TRACHEAL ASPIRATE  Final   Special Requests NONE  Final   Gram Stain   Final    ABUNDANT WBC PRESENT, PREDOMINANTLY PMN FEW YEAST Performed at [facility], [address] [place], [place] [address]    Culture MODERATE CANDIDA ALBICANS  Final   Report Status 07/23/2017 FINAL  Final  Culture, bal-quantitative     Status: Abnormal   Collection Time: 07/23/17  9:30 AM  Result Value Ref Range Status    Specimen Description BRONCHIAL ALVEOLAR LAVAGE  Final   Special Requests NONE  Final   Gram Stain   Final    ABUNDANT WBC PRESENT, PREDOMINANTLY PMN RARE SQUAMOUS EPITHELIAL CELLS PRESENT FEW BUDDING YEAST SEEN Performed at [facility], [address], [place], [place] [address]    Culture 50,000 COLONIES/mL CANDIDA ALBICANS (A)  Final   Report Status 07/25/2017 FINAL  Final  Body fluid culture     Status: None   Collection Time: 07/26/17  1:00 PM  Result Value Ref Range Status   Specimen Description   Final    PLEURAL Performed at [facility], [address], [place], [place] [address]    Special Requests   Final    Normal Performed at [facility], [address] [place], [place] [address]    Gram Stain   Final    WBC PRESENT, PREDOMINANTLY PMN NO ORGANISMS SEEN CYTOSPIN SMEAR    Culture   Final    No growth aerobically or anaerobically. Performed at [facility], [address], [place], [place] [address]    Report Status 07/30/2017 FINAL  Final    Radiology Reports Ct Abdomen Pelvis Wo Contrast  Result Date: 07/08/2017 CLINICAL DATA:  72 year old male with nausea vomiting. EXAM: CT CHEST, ABDOMEN AND PELVIS WITHOUT CONTRAST TECHNIQUE: Multidetector CT imaging of the chest, abdomen and pelvis was performed following the standard protocol without IV contrast. COMPARISON:  Radiograph of the chest abdomen pelvis dated 07/07/2017 FINDINGS: Evaluation of this exam is limited in the absence of intravenous contrast. CT CHEST FINDINGS Cardiovascular: There is no cardiomegaly. Coronary vascular calcification primarily involving the LAD. Small anterior pericardial effusion measuring 8 mm in thickness. There is mild atherosclerotic calcification of the thoracic aorta. The thoracic aorta and central pulmonary arteries are grossly unremarkable on this noncontrast CT. Mediastinum/Nodes: No hilar or mediastinal adenopathy. The esophagus  contains a mixed density fluid content with areas of higher attenuation, likely residual oral contrast. Findings likely represent reflux or esophageal dysmotility and delayed emptying. Blood product is less likely. Clinical correlation is recommended. Endoscopy may provide better evaluation of the esophagus if clinically indicated. Lungs/Pleura: There is emphysematous changes of the lungs. Patchy area of nodular airspace density primarily involving the right middle lobe and anterior right upper lobe most consistent with pneumonia. Clinical correlation and follow-up to resolution recommended. There is mild bronchiectatic changes. No pneumothorax or pleural effusion. Endobronchial content in the region of the carina and proximal portion of the left mainstem bronchus likely mucous plugging. Musculoskeletal: There is degenerative changes of the spine. Old healed left rib fractures. No acute osseous pathology. CT ABDOMEN PELVIS FINDINGS No intra-abdominal free air or free fluid. Hepatobiliary: The liver is unremarkable. No intrahepatic biliary ductal dilatation. There is probable sludge or small stones within the gallbladder. No pericholecystic fluid or evidence of acute cholecystitis by CT. Pancreas: Unremarkable. No pancreatic ductal dilatation or surrounding inflammatory changes. Spleen: Normal in size without focal abnormality. Adrenals/Urinary Tract: The adrenal glands are unremarkable. Multiple bilateral renal hypodense lesions are not characterized on this CT but appear to demonstrate fluid attenuation. Ultrasound may provide better characterisation. Punctate nonobstructing right renal calculi versus vascular calcification. There is no hydronephrosis or obstructing stone on either side. The visualized ureters and urinary bladder appear unremarkable. Stomach/Bowel: There is a small hiatal hernia.  Oral contrast opacifies multiple loops of small bowel. There is no evidence of bowel obstruction or active inflammation.  There is sigmoid diverticulosis without active inflammation. Normal appendix. Vascular/Lymphatic: Advanced aortoiliac atherosclerotic disease. The IVC is grossly unremarkable. No portal venous gas. There is no adenopathy. Reproductive: The prostate and seminal vesicles are grossly unremarkable. Other: None Musculoskeletal: Osteopenia with degenerative changes of the lower lumbar spine. No acute osseous pathology. IMPRESSION: 1. Patchy areas of nodular airspace density primarily in the right middle lobe and right upper lobe most consistent with pneumonia. Clinical correlation and follow-up to resolution after treatment recommended. 2. Emphysema.  No pleural effusion or pneumothorax. 3. Mucous plugging in the proximal left mainstem bronchus. 4. Sigmoid diverticulosis. No bowel obstruction or active inflammation. Normal appendix. 5. Bilateral renal hypodense lesions, incompletely characterized. Ultrasound may provide better evaluation. No hydronephrosis or obstructing stone. 6. Aortic Atherosclerosis (ICD10-I70.0) and Emphysema (ICD10-J43.9). Electronically Signed   By: Elgie Collard M.D.   On: 07/08/2017 05:47   Dg Chest 1 View  Result Date: 07/20/2017 CLINICAL DATA:  Shortness of breath, weakness. History of esophageal malignancy, COPD on home oxygen, former smoker. EXAM: CHEST 1 VIEW COMPARISON:  Chest x-ray of July 19, 2017 FINDINGS: There is persistent infiltrate at the right lung base. There no large pleural effusion. The left lung is clear. The heart and pulmonary vascularity are normal. There is mild tortuosity of the ascending thoracic aorta. The bony thorax is unremarkable. IMPRESSION: COPD. Right basilar pneumonia little changed from yesterday's study. Electronically Signed   By: David  [place] M.D.   On: 07/20/2017 15:12   Dg Abd 1 View  Result Date: 07/26/2017 CLINICAL DATA:  NG tube advancement EXAM: ABDOMEN - 1 VIEW COMPARISON:  07/25/2017 FINDINGS: NG tube looped in the distal esophagus.  Right lower lobe opacity with small right pleural effusion. Skin staples overlying the upper abdomen. IMPRESSION: NG tube looped in the distal esophagus. These results will be called to the ordering clinician or representative by the Radiologist Assistant, and communication documented in the PACS or zVision Dashboard. Electronically Signed   By: Charline Bills M.D.   On: 07/26/2017 09:32   Dg Abd 1 View  Result Date: 07/25/2017 CLINICAL DATA:  Status post NG tube placement today. EXAM: ABDOMEN - 1 VIEW COMPARISON:  CT abdomen and pelvis 07/20/2017. Single-view of the chest 07/25/2017. FINDINGS: The patient has 2 NG tubes in place. Tip and side-port of 1 of the tubes are in the stomach. Tip of the second tube is at the gastroesophageal junction and should be advanced 7-8 cm. Endotracheal tube is unchanged and in good position. Lungs are emphysematous. Right basilar airspace disease and effusion are unchanged. Heart size is normal. No pneumothorax. IMPRESSION: Two NG tubes are identified. One is in good position. Tip of the second is at gastroesophageal junction and should be advanced 7-8 cm. Endotracheal tube in good position. No change in a right effusion and airspace disease. Emphysema. Electronically Signed   By: Drusilla Kanner M.D.   On: 07/25/2017 10:20   Ct Chest Wo Contrast  Result Date: 07/08/2017 CLINICAL DATA:  72 year old male with nausea vomiting. EXAM: CT CHEST, ABDOMEN AND PELVIS WITHOUT CONTRAST TECHNIQUE: Multidetector CT imaging of the chest, abdomen and pelvis was performed following the standard protocol without IV contrast. COMPARISON:  Radiograph of the chest abdomen pelvis dated 07/07/2017 FINDINGS: Evaluation of this exam is limited in the absence of intravenous contrast. CT CHEST FINDINGS Cardiovascular: There is no cardiomegaly. Coronary vascular calcification primarily involving the  LAD. Small anterior pericardial effusion measuring 8 mm in thickness. There is mild  atherosclerotic calcification of the thoracic aorta. The thoracic aorta and central pulmonary arteries are grossly unremarkable on this noncontrast CT. Mediastinum/Nodes: No hilar or mediastinal adenopathy. The esophagus contains a mixed density fluid content with areas of higher attenuation, likely residual oral contrast. Findings likely represent reflux or esophageal dysmotility and delayed emptying. Blood product is less likely. Clinical correlation is recommended. Endoscopy may provide better evaluation of the esophagus if clinically indicated. Lungs/Pleura: There is emphysematous changes of the lungs. Patchy area of nodular airspace density primarily involving the right middle lobe and anterior right upper lobe most consistent with pneumonia. Clinical correlation and follow-up to resolution recommended. There is mild bronchiectatic changes. No pneumothorax or pleural effusion. Endobronchial content in the region of the carina and proximal portion of the left mainstem bronchus likely mucous plugging. Musculoskeletal: There is degenerative changes of the spine. Old healed left rib fractures. No acute osseous pathology. CT ABDOMEN PELVIS FINDINGS No intra-abdominal free air or free fluid. Hepatobiliary: The liver is unremarkable. No intrahepatic biliary ductal dilatation. There is probable sludge or small stones within the gallbladder. No pericholecystic fluid or evidence of acute cholecystitis by CT. Pancreas: Unremarkable. No pancreatic ductal dilatation or surrounding inflammatory changes. Spleen: Normal in size without focal abnormality. Adrenals/Urinary Tract: The adrenal glands are unremarkable. Multiple bilateral renal hypodense lesions are not characterized on this CT but appear to demonstrate fluid attenuation. Ultrasound may provide better characterisation. Punctate nonobstructing right renal calculi versus vascular calcification. There is no hydronephrosis or obstructing stone on either side. The  visualized ureters and urinary bladder appear unremarkable. Stomach/Bowel: There is a small hiatal hernia. Oral contrast opacifies multiple loops of small bowel. There is no evidence of bowel obstruction or active inflammation. There is sigmoid diverticulosis without active inflammation. Normal appendix. Vascular/Lymphatic: Advanced aortoiliac atherosclerotic disease. The IVC is grossly unremarkable. No portal venous gas. There is no adenopathy. Reproductive: The prostate and seminal vesicles are grossly unremarkable. Other: None Musculoskeletal: Osteopenia with degenerative changes of the lower lumbar spine. No acute osseous pathology. IMPRESSION: 1. Patchy areas of nodular airspace density primarily in the right middle lobe and right upper lobe most consistent with pneumonia. Clinical correlation and follow-up to resolution after treatment recommended. 2. Emphysema.  No pleural effusion or pneumothorax. 3. Mucous plugging in the proximal left mainstem bronchus. 4. Sigmoid diverticulosis. No bowel obstruction or active inflammation. Normal appendix. 5. Bilateral renal hypodense lesions, incompletely characterized. Ultrasound may provide better evaluation. No hydronephrosis or obstructing stone. 6. Aortic Atherosclerosis (ICD10-I70.0) and Emphysema (ICD10-J43.9). Electronically Signed   By: Elgie Collard M.D.   On: 07/08/2017 05:47   Ct Abdomen Pelvis W Contrast  Result Date: 07/20/2017 CLINICAL DATA:  Recently diagnosed esophageal cancer. Abdominal distention. Recent jejunostomy (5 days postop) tube with fluid leak concerning for ascites EXAM: CT ABDOMEN AND PELVIS WITH CONTRAST TECHNIQUE: Multidetector CT imaging of the abdomen and pelvis was performed using the standard protocol following bolus administration of intravenous contrast. CONTRAST:  100 cc Isovue-300 intravenous COMPARISON:  07/08/2017 FINDINGS: Lower chest: Airspace opacity in the right lower lobe. Improved airspace disease in the right middle  lobe. Suspect aspiration given the chronically distended esophagus above the GE junction mass. Small bilateral pleural effusion. Hepatobiliary: Geographic low-density in segment 5 and 8 of the liver without visible underlying lesions seen. Negative gallbladder. Pancreas: Unremarkable. Spleen: Unremarkable. Adrenals/Urinary Tract: Negative adrenals. No hydronephrosis or stone. Moderate scarring involving the upper pole left renal cortex.  Bilateral simple appearing renal cysts. Unremarkable bladder. Stomach/Bowel: Recent jejunostomy in unremarkable position. The bowel is kinked at the level of the enterotomy and there is marked proximal small bowel and stomach distension. Small volume pneumoperitoneum which is expected after recent surgery. Distended lower esophagus above a GE junction mass. Colonic diverticulosis. Vascular/Lymphatic: Atherosclerosis with advanced atheromatous wall thickening of the aorta. No definite adenopathy. PET-CT would be more sensitive in this setting. Reproductive:No pathologic findings. Other: No ascites or pneumoperitoneum. Musculoskeletal: No acute abnormalities. A call has been placed to the ordering provider. IMPRESSION: 1. High-grade proximal small bowel obstruction with transition at the jejunostomy tube. There is a degree of closed loop physiology given the GE junction mass and severe stenosis. 2. Dilated esophagus above the GE junction mass. There is shifting opacity at the right base concerning for recurrent aspiration. 3. Small pleural effusions. 4. Perfusion anomaly in the ventral right lobe liver without visible underlying cause. Attention on future staging scans. Electronically Signed   By: Marnee Spring M.D.   On: 07/20/2017 15:07   Dg Esophagus  Result Date: 07/11/2017 CLINICAL DATA:  Persistent dysphagia, nausea/vomiting, unable to tolerate more than a tablespoon of food at a time, decreased appetite EXAM: ESOPHOGRAM/BARIUM SWALLOW TECHNIQUE: Single contrast examination  was performed using  thin barium. FLUOROSCOPY TIME:  Fluoroscopy Time:  3.1 minutes Radiation Exposure Index (if provided by the fluoroscopic device): 1.9 mGy Number of Acquired Spot Images: 5 COMPARISON:  CT chest abdomen pelvis dated 07/08/2017 FINDINGS: Moderate esophageal dysmotility with fluid/debris in the mid/distal esophagus. Marked narrowing/stricture of the distal esophagus just above the GE junction. After significant delay, trace contrast passed into the proximal stomach. When correlating with the recent CT, there is no evidence of esophageal mass. However, a fat containing hernia at the esophageal hiatus is present, possibly leading to extrinsic compression. IMPRESSION: Severe stricture of the distal esophagus just above the GE junction. Secondary esophageal dysmotility. When correlating with recent CT, there is no evidence of esophageal mass. Possible extrinsic compression secondary to a fat containing hernia. Consider endoscopy for further evaluation. Electronically Signed   By: Charline Bills M.D.   On: 07/11/2017 10:21   US Renal  Result Date: 07/09/2017 CLINICAL DATA:  Elevated creatinine EXAM: RENAL / URINARY TRACT ULTRASOUND COMPLETE COMPARISON:  CT 07/08/2017 FINDINGS: Right Kidney: Length: 10.6 cm. Echogenicity within normal limits. No mass or hydronephrosis visualized. Left Kidney: Length: 10.3 cm. Multiple cysts in the left kidney, the largest 4.5 cm. These appear benign. No hydronephrosis. Bladder: Appears normal for degree of bladder distention. IMPRESSION: Left renal cysts. No acute findings.  No hydronephrosis. Electronically Signed   By: Charlett Nose M.D.   On: 07/09/2017 15:56   Dg Abdomen Peg Tube Location  Result Date: 07/19/2017 CLINICAL DATA:  Jejunostomy tube leaking. EXAM: ABDOMEN - 1 VIEW COMPARISON:  07/19/2016. FINDINGS: KUB following nonionic contrast administration into enterostomy tube obtained. Enterostomy tube tip noted within a right-sided abdominal small-bowel  loop. No definite leakage noted. Contrast from prior study noted in the colon. IMPRESSION: Enterostomy tube tip noted in a small-bowel loop in the right abdomen. Electronically Signed   By: Maisie Fus  Register   On: 07/19/2017 16:41   Dg Chest Port 1 View  Result Date: 07/27/2017 CLINICAL DATA:  Atelectasis. EXAM: PORTABLE CHEST 1 VIEW COMPARISON:  Radiographs of July 26, 2016. FINDINGS: Stable cardiomediastinal silhouette. Stable right lower lobe opacity is noted concerning for pneumonia. No pneumothorax is noted. Mild left basilar subsegmental atelectasis is noted. Bony thorax is unremarkable.  IMPRESSION: Stable right lower lobe pneumonia. Electronically Signed   By: Lupita Raider, M.D.   On: 07/27/2017 08:07   Dg Chest Port 1 View  Result Date: 07/26/2017 CLINICAL DATA:  Thoracentesis. EXAM: PORTABLE CHEST 1 VIEW COMPARISON:  One-view chest x-ray from the same day. FINDINGS: The heart size is normal. Interstitial and airspace disease at the right lung base is slightly improved. There is no pneumothorax. Mild left base airspace disease present. Emphysematous changes are noted. The NG tube was removed. IMPRESSION: 1. Persistent right lower lobe interstitial and airspace disease concerning for pneumonia. 2. Partial clearing of the right upper lobe. 3. Mild atelectasis left base. 4. COPD. 5. Interval removal of NG tube. Electronically Signed   By: Marin Roberts M.D.   On: 07/26/2017 14:03   Dg Chest Port 1 View  Result Date: 07/26/2017 CLINICAL DATA:  72 year old male with obstructing esophageal cancer. Postoperative day 6 status post abdominal surgery for small bowel obstruction at a jejunostomy tube site. EXAM: PORTABLE CHEST 1 VIEW COMPARISON:  07/25/2017 and earlier. FINDINGS: Portable AP semi upright view at 0454 hours. Extubated. Enteric tube remains in place, but is looped in the distal esophagus level. Yesterday the tube appear to course into the stomach. Continued volume loss in the  right lung with mild rightward shift of the mediastinum. Superimposed veiling right lung opacity suggesting pleural effusion. Large left lung volume. The left lung remains clear. Stable mediastinal contours. IMPRESSION: 1. Enteric tube now is looped in the distal esophagus, whereas yesterday tube appeared to course into the stomach. 2. Extubated. 3. Suspect combined lower lobe collapse and pleural effusion in the right lung. Consider mucous plug. 4. The left lung remains clear. Electronically Signed   By: Odessa Fleming M.D.   On: 07/26/2017 07:41   Dg Chest Port 1 View  Result Date: 07/25/2017 CLINICAL DATA:  Respiratory failure EXAM: PORTABLE CHEST 1 VIEW COMPARISON:  Portable chest x-ray of July 24, 2017 FINDINGS: There is increased density in the right mid and lower lung as compared to yesterday's study. The left lung is clear. The heart and pulmonary vascularity are normal. The endotracheal tube tip lies approximately 3.8 cm above the carina. The esophagogastric tube tip in proximal port project below the GE junction. IMPRESSION: Increased density on the right consistent with worsening atelectasis or pneumonia and likely a posterior layering pleural effusion. Underlying COPD. The support tubes are in reasonable position. Electronically Signed   By: David  [place] M.D.   On: 07/25/2017 06:55   Dg Chest Port 1 View  Result Date: 07/24/2017 CLINICAL DATA:  Follow-up ventilator support. EXAM: PORTABLE CHEST 1 VIEW COMPARISON:  07/23/2017 FINDINGS: Endotracheal tube tip is 4 cm above the carina. Nasogastric tube enters the abdomen. Background emphysema. Left lung is otherwise clear. Persistent pneumonia and volume loss in the right lower lung. I think there is slight radiographic improvement. IMPRESSION: Slight improvement in lower right lung pneumonia. Electronically Signed   By: Paulina Fusi M.D.   On: 07/24/2017 06:53   Dg Chest Port 1 View  Result Date: 07/23/2017 CLINICAL DATA:  Post intubation and  enteric tube placement EXAM: PORTABLE CHEST 1 VIEW COMPARISON:  07/22/2017 FINDINGS: Endotracheal tube placed with tip measuring 3 cm above the carina. Enteric tube tip is coiled in the left upper quadrant consistent with location in the upper stomach. Volume loss and consolidation in the right lung with hyperinflation of the left lung. Probable right pleural effusion. Right lung changes are progressing since the previous  study. No pneumothorax. IMPRESSION: Appliances appear in satisfactory position. Progression of consolidation, effusion, and volume loss in the right lung since previous study. Compensatory hyperinflation of the left lung. Electronically Signed   By: Burman Nieves M.D.   On: 07/23/2017 03:51   Dg Chest Port 1 View  Result Date: 07/22/2017 CLINICAL DATA:  Status postextubation.  Airspace consolidation. EXAM: PORTABLE CHEST 1 VIEW COMPARISON:  July 21, 2017 FINDINGS: Endotracheal tube and nasogastric tube have been removed. No pneumothorax. There is airspace consolidation throughout the right mid lower lung zones, increased from 1 day prior. There is a small right pleural effusion. The left lung is somewhat hyperexpanded. There is no edema or consolidation on the left. Heart size and pulmonary vascular normal. No adenopathy appreciable. Old healed fractures noted on the left. IMPRESSION: No pneumothorax. Increase in airspace consolidation in the right mid and lower lung zones with small right pleural effusion. Question progression of pneumonia versus aspiration. Both entities may be present concurrently. Left lung hyperexpanded but clear. Stable cardiac silhouette. Electronically Signed   By: Bretta Bang III M.D.   On: 07/22/2017 07:32   Portable Chest Xray  Result Date: 07/21/2017 CLINICAL DATA:  Endotracheal tube positioning EXAM: PORTABLE CHEST 1 VIEW COMPARISON:  Yesterday FINDINGS: Endotracheal tube tip between the clavicular heads and carina. An orogastric tube reaches the  stomach. Lung opacity at the right more than left base. Small pleural effusions by abdominal CT yesterday. Large lung volumes compatible with COPD. IMPRESSION: 1. Stable positioning of endotracheal and orogastric tubes. 2. Small pleural effusions and right more than left lower lobe opacity. Clinical circumstances primarily concerning for aspiration pneumonitis/pneumonia. Electronically Signed   By: Marnee Spring M.D.   On: 07/21/2017 07:07   Portable Chest X-ray  Result Date: 07/20/2017 CLINICAL DATA:  72 year old male with a history of endotracheal tube placement EXAM: PORTABLE CHEST 1 VIEW COMPARISON:  07/20/2017 abdominal CT 07/20/2017 FINDINGS: Cardiomediastinal silhouette unchanged in size and contour. Interval placement of endotracheal tube, terminating suitably above the carina approximately 4 cm. Interval placement of gastric tube, terminating out of the field of view. Similar appearance of bibasilar opacities, more pronounced on the right. No pneumothorax IMPRESSION: Interval placement of endotracheal tube, terminating suitably above the carina. Interval placement of gastric tube, terminating out of the field of view. Similar appearance of right greater than left airspace disease Electronically Signed   By: Gilmer Mor D.O.   On: 07/20/2017 20:01   Dg Chest Port 1 View  Result Date: 07/19/2017 CLINICAL DATA:  Shortness of breath. EXAM: PORTABLE CHEST 1 VIEW COMPARISON:  CT 07/08/2016.  Chest x-ray 01/13/2016. FINDINGS: Mediastinum hilar structures normal. Cardiomegaly with normal pulmonary vascularity. COPD. Right base infiltrate with small right pleural effusion. IMPRESSION: 1. Right base infiltrate consistent with pneumonia. Small right pleural effusion. Similar findings noted on prior CT of 07/08/2016. 2.  COPD. Electronically Signed   By: Maisie Fus  Register   On: 07/19/2017 13:51   Dg Abd Acute W/chest  Result Date: 07/08/2017 CLINICAL DATA:  Vomiting for 10 days.  Weight loss. EXAM: DG  ABDOMEN ACUTE W/ 1V CHEST COMPARISON:  Chest radiograph 01/13/2016 FINDINGS: The lungs are hyperexpanded with multiple areas of increased lucency. No focal airspace consolidation or pulmonary edema. No pneumothorax or sizable pleural effusion. No free intraperitoneal air. No dilated loops of bowel are visible. The round densities overlying the left psoas muscle may be within the transverse colon. IMPRESSION: 1. COPD without acute airspace disease. 2. No free intraperitoneal air or evidence  of small-bowel obstruction. Electronically Signed   By: Deatra Robinson M.D.   On: 07/08/2017 00:07   Dg Abd Portable 1v  Result Date: 07/27/2017 CLINICAL DATA:  Gastroesophageal ca, f/u feeding tube placement, some fullness feeling in stomach EXAM: PORTABLE ABDOMEN - 1 VIEW COMPARISON:  Radiograph 129 19 FINDINGS: Enteric tube projects over the RIGHT lower quadrant. No dilated large or small bowel. Small amount of high-density stool remains in rectum IMPRESSION: No bowel obstruction Electronically Signed   By: Genevive Bi M.D.   On: 07/27/2017 10:13   Dg Abd Portable 1v  Result Date: 07/19/2017 CLINICAL DATA:  COPD, nausea vomiting EXAM: PORTABLE ABDOMEN - 1 VIEW COMPARISON:  None. FINDINGS: No dilated large or small bowel. Jejunostomy tube with tip in the RIGHT lower quadrant. Midline skin staples noted. No evidence bowel obstruction. Oral contrast from prior CT within the colon IMPRESSION: 1. No evidence of bowel obstruction. 2. Jejunostomy tube in place. Electronically Signed   By: Genevive Bi M.D.   On: 07/19/2017 10:34   Dg Kayleen Memos W/o Kub  Result Date: 07/26/2017 CLINICAL DATA:  72 year old male with esophageal cancer. Subsequent encounter. EXAM: UPPER GI SERIES WITHOUT KUB TECHNIQUE: Routine upper GI series was performed with thin barium. FLUOROSCOPY TIME:  Fluoroscopy Time:  1 minute and 48 seconds Radiation Exposure Index: 5.6 mGy COMPARISON:  07/26/2017 chest x-ray.  07/20/2017 CT. FINDINGS: Patient's  recent nasogastric tube was removed by nursing staff prior to coming to radiology. Request for placing nasogastric tube and performing upper GI series and small-bowel follow-through. Procedure discussed with patient. Lidocaine gel utilized. Nasogastric tube gently advanced. Not able to advance into the stomach. Barium instilled. Barium did not traverses into the stomach. Barium was aspirated and nasogastric tube removed. IMPRESSION: Unsuccessful placement of nasogastric tube. Not able to performed upper GI series and small-bowel follow-through as noted above. Results discussed with patient's nurse with request to call covering physician with results. Electronically Signed   By: Lacy Duverney M.D.   On: 07/26/2017 11:48     CBC Recent Labs  Lab 07/25/17 0333 07/26/17 0119 07/27/17 0307 07/28/17 0318 07/30/17 0244  WBC 15.5* 17.4* 14.0* 13.5* 12.5*  HGB 12.5* 12.8* 11.7* 11.3* 10.8*  HCT 38.0* 39.3 34.5* 34.6* 32.8*  PLT 254 305 299 236 297  MCV 89.2 89.9 88.9 89.2 91.4  MCH 29.3 29.3 30.2 29.1 30.1  MCHC 32.9 32.6 33.9 32.7 32.9  RDW 16.4* 16.3* 16.2* 16.3* 16.2*    Chemistries  Recent Labs  Lab 07/24/17 0321 07/24/17 2259 07/25/17 0333 07/26/17 0119 07/27/17 0307 07/28/17 0318 07/30/17 0244  NA 141 141 141 138 140 140 140  K 3.9 3.8 3.9 4.2 3.8 3.8 4.3  CL 106 107 108 103 105 103 96*  CO2 29 29 28 28  32 33* 37*  GLUCOSE 130* 132* 113* 87 166* 107* 148*  BUN 11 10 11 10 14 13 15   CREATININE 0.79 0.69 0.67 0.61 0.78 0.71 0.61  CALCIUM 8.2* 8.0* 7.9* 8.3* 8.0* 7.7* 7.9*  MG 1.9 1.9 1.8 1.6* 2.4  --   --   AST  --   --   --  16 36 41  --   ALT  --   --   --  15* 22 33  --   ALKPHOS  --   --   --  77 94 99  --   BILITOT  --   --   --  0.8 0.5 0.3  --    ------------------------------------------------------------------------------------------------------------------ No  results for input(s): CHOL, HDL, LDLCALC, TRIG, CHOLHDL, LDLDIRECT in the last 72 hours.  No results found  for: HGBA1C ------------------------------------------------------------------------------------------------------------------ No results for input(s): TSH, T4TOTAL, T3FREE, THYROIDAB in the last 72 hours.  Invalid input(s): FREET3 ------------------------------------------------------------------------------------------------------------------ No results for input(s): VITAMINB12, FOLATE, FERRITIN, TIBC, IRON, RETICCTPCT in the last 72 hours.  Coagulation profile No results for input(s): INR, PROTIME in the last 168 hours.  No results for input(s): DDIMER in the last 72 hours.  Cardiac Enzymes Recent Labs  Lab 07/25/17 1344 07/25/17 1916 07/26/17 0119  TROPONINI <0.03 <0.03 <0.03   ------------------------------------------------------------------------------------------------------------------ No results found for: BNP   Brelynn Wheller M.D on 07/30/2017 at 12:24 PM  Between 7am to 7pm - Pager - [phone]  After 7pm go to [email or web address] - password [email or web address]  [facility] -  Office  [phone]  Voice Recognition Reubin [place] dictation system was used to create this note, attempts have been made to correct errors. Please contact the author with questions and/or clarifications.

## 2017-07-31 NOTE — Progress Notes (Signed)
Patient Demographics:    George Stokes, is a 72 y.o. male, DOB - 07-Jan-1946, UUV:[id number]  Admit date - 07/07/2017   Admitting Physician Hillary Bow, DO  Outpatient Primary MD for the patient is Patient, No Pcp Per LOS - 23  Chief Complaint  Patient presents with  . Emesis       Subjective:    George Stokes today has no fevers, no emesis,  No chest pain, tolerating tube feed well  Assessment  & Plan :    Principal Problem:   Adenocarcinoma of esophagus (HCC) Active Problems:   Essential hypertension   COPD with hypoxia (HCC)   Chronic respiratory failure with hypoxia (HCC)   Intractable nausea and vomiting   CAP (community acquired pneumonia)   Lobar pneumonia (HCC)   Hypomagnesemia   Hypokalemia   Protein-calorie malnutrition, severe (HCC)   Esophageal dysphagia   Esophageal mass   Esophageal stricture   Esophageal cancer (HCC)   Malnutrition of moderate degree   H/O exploratory laparotomy   Acute respiratory failure (HCC)   Atelectasis of right lung  Brief Summary:- 72 y.o. male reformed smoker with COPD, Chronic Hypoxic Resp Failure  on 4 liters , HTN and history of alcohol abuse admitted on 07/08/17 with N/V and a 20 pound weight loss.  S/P J tube placement, with complicated course due to SBO and leakage out of J tube. S/P Ex-Lap and jejunostomy revision. EGD performed 07/13/17 found malignant appearing esophageal stenosis/ annular adenocarcinoma (pathology/cytology was not definitive for malignancy), awaiting radiation and chemotherapy,  On 07/15/17  Had placement of a jejunostomy feeding tube ,  He developed an obstruction at the site of the feeding tube 07/20/2017 and was taken to the operating room for revision.  Following this procedure he had prolonged intubation and was extubated 07/25/2017. He underwent a right thoracentesis 07/26/2017, he cytology revealed acute inflammation (no  malignant cells)     Plan:- 1)GE junction malignant esophageal stricture/ annular adenocarcinoma-input from Dr Christella Hartigan (Gi) and Dr Truett Perna (oncology) appreciated, please note there is no definitive/confirmatory histological diagnosis at this time. Pt is to start XRT soon, plan for chemoRx week of 08/01/17,  w/ XRT the esophageal stricture may improve. Would continue strict NPO, continue  Protonix  2)FEN-tolerating tube feed well, n.p.o.,c/n Osmolite 1.5 @ 60 mL/hr via J-tube which will provides 2160 kcal, 90 grams of protein, and 1097 mL free water.,  Continue 50 mL free water every 4 hours via J-tube (300 mL/day), c/n thiamine and folic acid/multivitamin the patient with history of alcohol abuse, no frank emesis  3) Candida respiratory infection- BAL cultures growing Candida albicans, discussed with Dr. Marchelle Gearing, okay to treat with Diflucan for 1 week (first dose 07/28/2017)  4)HTN-stable, continue metoprolol 25 mg twice daily  5)Social/Ethics- Full Code,    6)Disposition-patient has significant post acute illness debility/weakness and poor endurance, physical therapy evaluation and social work consult appreciated, awaiting insurance approval for placement to skilled nursing facility rehab  7)Moderate to Severe Protein Caloric Malnutrition- Nutritional supplements as above #2  Transfer out of ICU to MedSurg bed on 07/31/2016  Consults  :  Gi/PCCMM/Oncology ([place])  Procedures:-  EGD from 07/13/2017 with malignant esophageal stricture (biopsy not conclusive) J-tube placed on 07/15/2017 Revision of J-tube on  07/20/2017 Initially extubated 07/21/2017 Reintubated 07/23/2017 Extubated 07/25/2017 NG tube placement by interventional radiology on 07/26/2017   DVT Prophylaxis  :  Heparin/ SCDs  Lab Results  Component Value Date   PLT 297 07/30/2017    Inpatient Medications  Scheduled Meds: . chlorhexidine gluconate (MEDLINE KIT)  15 mL Mouth Rinse BID  . Chlorhexidine Gluconate Cloth  6  each Topical Daily  . folic acid  1 mg Intravenous Daily  . free water  50 mL Per Tube Q4H  . heparin  5,000 Units Subcutaneous Q8H  . ipratropium-albuterol  3 mL Nebulization TID  . mouth rinse  15 mL Mouth Rinse QID  . metoprolol tartrate  25 mg Per Tube BID  . multivitamin  15 mL Oral Daily  . neomycin-bacitracin-polymyxin   Topical Once  . pantoprazole (PROTONIX) IV  40 mg Intravenous Q12H  . thiamine injection  100 mg Intravenous Daily   Continuous Infusions: . sodium chloride 250 mL (07/30/17 2100)  . feeding supplement (OSMOLITE 1.5 CAL) 1,000 mL (07/30/17 2100)  . fluconazole (DIFLUCAN) IV Stopped (07/31/17 1117)   PRN Meds:.sodium chloride, acetaminophen, fentaNYL (SUBLIMAZE) injection, hydrALAZINE, indomethacin, metoprolol tartrate, ondansetron **OR** ondansetron (ZOFRAN) IV, sodium chloride flush    Anti-infectives (From admission, onward)   Start     Dose/Rate Route Frequency Ordered Stop   07/29/17 0800  fluconazole (DIFLUCAN) IVPB 100 mg  Status:  Discontinued     100 mg 50 mL/hr over 60 Minutes Intravenous Every 24 hours 07/28/17 0840 07/28/17 0841   07/29/17 0800  fluconazole (DIFLUCAN) IVPB 100 mg     100 mg 50 mL/hr over 60 Minutes Intravenous Every 24 hours 07/28/17 0841 08/05/17 0759   07/28/17 0845  fluconazole (DIFLUCAN) IVPB 200 mg     200 mg 100 mL/hr over 60 Minutes Intravenous  Once 07/28/17 0840 07/28/17 1030   07/27/17 1000  fluconazole (DIFLUCAN) 40 MG/ML suspension 200 mg  Status:  Discontinued     200 mg Per Tube Daily 07/26/17 0925 07/26/17 1100   07/26/17 1000  fluconazole (DIFLUCAN) 40 MG/ML suspension 400 mg  Status:  Discontinued     400 mg Per Tube  Once 07/26/17 0924 07/26/17 1100   07/20/17 1400  ertapenem (INVANZ) 1 g in sodium chloride 0.9 % 50 mL IVPB  Status:  Discontinued     1 g 100 mL/hr over 30 Minutes Intravenous Every 24 hours 07/20/17 1209 07/29/17 0940   07/16/17 0000  ceFAZolin (ANCEF) IVPB 2g/100 mL premix     2 g 200 mL/hr  over 30 Minutes Intravenous Every 8 hours 07/15/17 1833 07/16/17 0054   07/15/17 0945  amoxicillin-clavulanate (AUGMENTIN) 250-62.5 MG/5ML suspension 500 mg     500 mg Oral Every 8 hours 07/15/17 0938 07/19/17 0928   07/15/17 0600  ceFAZolin (ANCEF) IVPB 2g/100 mL premix     2 g 200 mL/hr over 30 Minutes Intravenous On call to O.R. 07/14/17 1809 07/15/17 1632   07/11/17 1600  ampicillin-sulbactam (UNASYN) 1.5 g in sodium chloride 0.9 % 50 mL IVPB  Status:  Discontinued     1.5 g 100 mL/hr over 30 Minutes Intravenous Every 6 hours 07/11/17 1329 07/15/17 0940   07/08/17 1000  cefTRIAXone (ROCEPHIN) 1 g in dextrose 5 % 50 mL IVPB  Status:  Discontinued     1 g 100 mL/hr over 30 Minutes Intravenous Every 24 hours 07/08/17 0954 07/11/17 1315   07/08/17 1000  azithromycin (ZITHROMAX) 500 mg in dextrose 5 % 250 mL IVPB  Status:  Discontinued     500 mg 250 mL/hr over 60 Minutes Intravenous Every 24 hours 07/08/17 0954 07/11/17 1315        Objective:   Vitals:   07/31/17 1100 07/31/17 1200 07/31/17 1300 07/31/17 1430  BP: (!) 94/46  (!) 124/59 128/67  Pulse:    84  Resp: 14  13 16   Temp:  97.8 F (36.6 C)  98.1 F (36.7 C)  TempSrc:  Oral  Oral  SpO2: 95%  91% (!) 88%  Weight:      Height:        Wt Readings from Last 3 Encounters:  07/31/17 63.4 kg (139 lb 12.4 oz)  06/29/17 57.2 kg (126 lb)  01/04/17 72.9 kg (160 lb 12.8 oz)     Intake/Output Summary (Last 24 hours) at 07/31/2017 1632 Last data filed at 07/31/2017 1429 Gross per 24 hour  Intake 170 ml  Output 775 ml  Net -605 ml    Physical Exam  Gen:- Awake Alert, patient looks emanciated in no apparent distress  HEENT:- [place].AT, No sclera icterus Neck-Supple Neck,No JVD, Nose- [place] 2 L/min Lungs-diminished in bases CV- S1, S2 normal Abd-  +ve B.Sounds, Abd Soft, No tenderness, J-tube site is clean dry and intact,    Extremity/Skin:- No  edema,    Neuro-no tremors, generalized weakness without new focal  deficits Psych-affect is appropriate   Data Review:   Micro Results Recent Results (from the past 240 hour(s))  Culture, bal-quantitative     Status: Abnormal   Collection Time: 07/23/17  9:30 AM  Result Value Ref Range Status   Specimen Description BRONCHIAL ALVEOLAR LAVAGE  Final   Special Requests NONE  Final   Gram Stain   Final    ABUNDANT WBC PRESENT, PREDOMINANTLY PMN RARE SQUAMOUS EPITHELIAL CELLS PRESENT FEW BUDDING YEAST SEEN Performed at [facility], [address], [place], [place] [address]    Culture 50,000 COLONIES/mL CANDIDA ALBICANS (A)  Final   Report Status 07/25/2017 FINAL  Final  Body fluid culture     Status: None   Collection Time: 07/26/17  1:00 PM  Result Value Ref Range Status   Specimen Description   Final    PLEURAL Performed at [facility], [address], [place], [place] [address]    Special Requests   Final    Normal Performed at [facility], [address] [place], [place] [address]    Gram Stain   Final    WBC PRESENT, PREDOMINANTLY PMN NO ORGANISMS SEEN CYTOSPIN SMEAR    Culture   Final    No growth aerobically or anaerobically. Performed at [facility], [address], [place], [place] [address]    Report Status 07/30/2017 FINAL  Final    Radiology Reports Ct Abdomen Pelvis Wo Contrast  Result Date: 07/08/2017 CLINICAL DATA:  72 year old male with nausea vomiting. EXAM: CT CHEST, ABDOMEN AND PELVIS WITHOUT CONTRAST TECHNIQUE: Multidetector CT imaging of the chest, abdomen and pelvis was performed following the standard protocol without IV contrast. COMPARISON:  Radiograph of the chest abdomen pelvis dated 07/07/2017 FINDINGS: Evaluation of this exam is limited in the absence of intravenous contrast. CT CHEST FINDINGS Cardiovascular: There is no cardiomegaly. Coronary vascular calcification primarily involving the LAD. Small anterior pericardial effusion measuring 8 mm in thickness.  There is mild atherosclerotic calcification of the thoracic aorta. The thoracic aorta and central pulmonary arteries are grossly unremarkable on this noncontrast CT. Mediastinum/Nodes: No hilar or  mediastinal adenopathy. The esophagus contains a mixed density fluid content with areas of higher attenuation, likely residual oral contrast. Findings likely represent reflux or esophageal dysmotility and delayed emptying. Blood product is less likely. Clinical correlation is recommended. Endoscopy may provide better evaluation of the esophagus if clinically indicated. Lungs/Pleura: There is emphysematous changes of the lungs. Patchy area of nodular airspace density primarily involving the right middle lobe and anterior right upper lobe most consistent with pneumonia. Clinical correlation and follow-up to resolution recommended. There is mild bronchiectatic changes. No pneumothorax or pleural effusion. Endobronchial content in the region of the carina and proximal portion of the left mainstem bronchus likely mucous plugging. Musculoskeletal: There is degenerative changes of the spine. Old healed left rib fractures. No acute osseous pathology. CT ABDOMEN PELVIS FINDINGS No intra-abdominal free air or free fluid. Hepatobiliary: The liver is unremarkable. No intrahepatic biliary ductal dilatation. There is probable sludge or small stones within the gallbladder. No pericholecystic fluid or evidence of acute cholecystitis by CT. Pancreas: Unremarkable. No pancreatic ductal dilatation or surrounding inflammatory changes. Spleen: Normal in size without focal abnormality. Adrenals/Urinary Tract: The adrenal glands are unremarkable. Multiple bilateral renal hypodense lesions are not characterized on this CT but appear to demonstrate fluid attenuation. Ultrasound may provide better characterisation. Punctate nonobstructing right renal calculi versus vascular calcification. There is no hydronephrosis or obstructing stone on either  side. The visualized ureters and urinary bladder appear unremarkable. Stomach/Bowel: There is a small hiatal hernia. Oral contrast opacifies multiple loops of small bowel. There is no evidence of bowel obstruction or active inflammation. There is sigmoid diverticulosis without active inflammation. Normal appendix. Vascular/Lymphatic: Advanced aortoiliac atherosclerotic disease. The IVC is grossly unremarkable. No portal venous gas. There is no adenopathy. Reproductive: The prostate and seminal vesicles are grossly unremarkable. Other: None Musculoskeletal: Osteopenia with degenerative changes of the lower lumbar spine. No acute osseous pathology. IMPRESSION: 1. Patchy areas of nodular airspace density primarily in the right middle lobe and right upper lobe most consistent with pneumonia. Clinical correlation and follow-up to resolution after treatment recommended. 2. Emphysema.  No pleural effusion or pneumothorax. 3. Mucous plugging in the proximal left mainstem bronchus. 4. Sigmoid diverticulosis. No bowel obstruction or active inflammation. Normal appendix. 5. Bilateral renal hypodense lesions, incompletely characterized. Ultrasound may provide better evaluation. No hydronephrosis or obstructing stone. 6. Aortic Atherosclerosis (ICD10-I70.0) and Emphysema (ICD10-J43.9). Electronically Signed   By: Elgie Collard M.D.   On: 07/08/2017 05:47   Dg Chest 1 View  Result Date: 07/20/2017 CLINICAL DATA:  Shortness of breath, weakness. History of esophageal malignancy, COPD on home oxygen, former smoker. EXAM: CHEST 1 VIEW COMPARISON:  Chest x-ray of July 19, 2017 FINDINGS: There is persistent infiltrate at the right lung base. There no large pleural effusion. The left lung is clear. The heart and pulmonary vascularity are normal. There is mild tortuosity of the ascending thoracic aorta. The bony thorax is unremarkable. IMPRESSION: COPD. Right basilar pneumonia little changed from yesterday's study.  Electronically Signed   By: David  [place] M.D.   On: 07/20/2017 15:12   Dg Abd 1 View  Result Date: 07/26/2017 CLINICAL DATA:  NG tube advancement EXAM: ABDOMEN - 1 VIEW COMPARISON:  07/25/2017 FINDINGS: NG tube looped in the distal esophagus. Right lower lobe opacity with small right pleural effusion. Skin staples overlying the upper abdomen. IMPRESSION: NG tube looped in the distal esophagus. These results will be called to the ordering clinician or representative by the Radiologist Assistant, and communication documented in the PACS  or zVision Dashboard. Electronically Signed   By: Charline Bills M.D.   On: 07/26/2017 09:32   Dg Abd 1 View  Result Date: 07/25/2017 CLINICAL DATA:  Status post NG tube placement today. EXAM: ABDOMEN - 1 VIEW COMPARISON:  CT abdomen and pelvis 07/20/2017. Single-view of the chest 07/25/2017. FINDINGS: The patient has 2 NG tubes in place. Tip and side-port of 1 of the tubes are in the stomach. Tip of the second tube is at the gastroesophageal junction and should be advanced 7-8 cm. Endotracheal tube is unchanged and in good position. Lungs are emphysematous. Right basilar airspace disease and effusion are unchanged. Heart size is normal. No pneumothorax. IMPRESSION: Two NG tubes are identified. One is in good position. Tip of the second is at gastroesophageal junction and should be advanced 7-8 cm. Endotracheal tube in good position. No change in a right effusion and airspace disease. Emphysema. Electronically Signed   By: Drusilla Kanner M.D.   On: 07/25/2017 10:20   Ct Chest Wo Contrast  Result Date: 07/08/2017 CLINICAL DATA:  72 year old male with nausea vomiting. EXAM: CT CHEST, ABDOMEN AND PELVIS WITHOUT CONTRAST TECHNIQUE: Multidetector CT imaging of the chest, abdomen and pelvis was performed following the standard protocol without IV contrast. COMPARISON:  Radiograph of the chest abdomen pelvis dated 07/07/2017 FINDINGS: Evaluation of this exam is limited in  the absence of intravenous contrast. CT CHEST FINDINGS Cardiovascular: There is no cardiomegaly. Coronary vascular calcification primarily involving the LAD. Small anterior pericardial effusion measuring 8 mm in thickness. There is mild atherosclerotic calcification of the thoracic aorta. The thoracic aorta and central pulmonary arteries are grossly unremarkable on this noncontrast CT. Mediastinum/Nodes: No hilar or mediastinal adenopathy. The esophagus contains a mixed density fluid content with areas of higher attenuation, likely residual oral contrast. Findings likely represent reflux or esophageal dysmotility and delayed emptying. Blood product is less likely. Clinical correlation is recommended. Endoscopy may provide better evaluation of the esophagus if clinically indicated. Lungs/Pleura: There is emphysematous changes of the lungs. Patchy area of nodular airspace density primarily involving the right middle lobe and anterior right upper lobe most consistent with pneumonia. Clinical correlation and follow-up to resolution recommended. There is mild bronchiectatic changes. No pneumothorax or pleural effusion. Endobronchial content in the region of the carina and proximal portion of the left mainstem bronchus likely mucous plugging. Musculoskeletal: There is degenerative changes of the spine. Old healed left rib fractures. No acute osseous pathology. CT ABDOMEN PELVIS FINDINGS No intra-abdominal free air or free fluid. Hepatobiliary: The liver is unremarkable. No intrahepatic biliary ductal dilatation. There is probable sludge or small stones within the gallbladder. No pericholecystic fluid or evidence of acute cholecystitis by CT. Pancreas: Unremarkable. No pancreatic ductal dilatation or surrounding inflammatory changes. Spleen: Normal in size without focal abnormality. Adrenals/Urinary Tract: The adrenal glands are unremarkable. Multiple bilateral renal hypodense lesions are not characterized on this CT but  appear to demonstrate fluid attenuation. Ultrasound may provide better characterisation. Punctate nonobstructing right renal calculi versus vascular calcification. There is no hydronephrosis or obstructing stone on either side. The visualized ureters and urinary bladder appear unremarkable. Stomach/Bowel: There is a small hiatal hernia. Oral contrast opacifies multiple loops of small bowel. There is no evidence of bowel obstruction or active inflammation. There is sigmoid diverticulosis without active inflammation. Normal appendix. Vascular/Lymphatic: Advanced aortoiliac atherosclerotic disease. The IVC is grossly unremarkable. No portal venous gas. There is no adenopathy. Reproductive: The prostate and seminal vesicles are grossly unremarkable. Other: None Musculoskeletal: Osteopenia with  degenerative changes of the lower lumbar spine. No acute osseous pathology. IMPRESSION: 1. Patchy areas of nodular airspace density primarily in the right middle lobe and right upper lobe most consistent with pneumonia. Clinical correlation and follow-up to resolution after treatment recommended. 2. Emphysema.  No pleural effusion or pneumothorax. 3. Mucous plugging in the proximal left mainstem bronchus. 4. Sigmoid diverticulosis. No bowel obstruction or active inflammation. Normal appendix. 5. Bilateral renal hypodense lesions, incompletely characterized. Ultrasound may provide better evaluation. No hydronephrosis or obstructing stone. 6. Aortic Atherosclerosis (ICD10-I70.0) and Emphysema (ICD10-J43.9). Electronically Signed   By: Elgie Collard M.D.   On: 07/08/2017 05:47   Ct Abdomen Pelvis W Contrast  Result Date: 07/20/2017 CLINICAL DATA:  Recently diagnosed esophageal cancer. Abdominal distention. Recent jejunostomy (5 days postop) tube with fluid leak concerning for ascites EXAM: CT ABDOMEN AND PELVIS WITH CONTRAST TECHNIQUE: Multidetector CT imaging of the abdomen and pelvis was performed using the standard protocol  following bolus administration of intravenous contrast. CONTRAST:  100 cc Isovue-300 intravenous COMPARISON:  07/08/2017 FINDINGS: Lower chest: Airspace opacity in the right lower lobe. Improved airspace disease in the right middle lobe. Suspect aspiration given the chronically distended esophagus above the GE junction mass. Small bilateral pleural effusion. Hepatobiliary: Geographic low-density in segment 5 and 8 of the liver without visible underlying lesions seen. Negative gallbladder. Pancreas: Unremarkable. Spleen: Unremarkable. Adrenals/Urinary Tract: Negative adrenals. No hydronephrosis or stone. Moderate scarring involving the upper pole left renal cortex. Bilateral simple appearing renal cysts. Unremarkable bladder. Stomach/Bowel: Recent jejunostomy in unremarkable position. The bowel is kinked at the level of the enterotomy and there is marked proximal small bowel and stomach distension. Small volume pneumoperitoneum which is expected after recent surgery. Distended lower esophagus above a GE junction mass. Colonic diverticulosis. Vascular/Lymphatic: Atherosclerosis with advanced atheromatous wall thickening of the aorta. No definite adenopathy. PET-CT would be more sensitive in this setting. Reproductive:No pathologic findings. Other: No ascites or pneumoperitoneum. Musculoskeletal: No acute abnormalities. A call has been placed to the ordering provider. IMPRESSION: 1. High-grade proximal small bowel obstruction with transition at the jejunostomy tube. There is a degree of closed loop physiology given the GE junction mass and severe stenosis. 2. Dilated esophagus above the GE junction mass. There is shifting opacity at the right base concerning for recurrent aspiration. 3. Small pleural effusions. 4. Perfusion anomaly in the ventral right lobe liver without visible underlying cause. Attention on future staging scans. Electronically Signed   By: Marnee Spring M.D.   On: 07/20/2017 15:07   Dg  Esophagus  Result Date: 07/11/2017 CLINICAL DATA:  Persistent dysphagia, nausea/vomiting, unable to tolerate more than a tablespoon of food at a time, decreased appetite EXAM: ESOPHOGRAM/BARIUM SWALLOW TECHNIQUE: Single contrast examination was performed using  thin barium. FLUOROSCOPY TIME:  Fluoroscopy Time:  3.1 minutes Radiation Exposure Index (if provided by the fluoroscopic device): 1.9 mGy Number of Acquired Spot Images: 5 COMPARISON:  CT chest abdomen pelvis dated 07/08/2017 FINDINGS: Moderate esophageal dysmotility with fluid/debris in the mid/distal esophagus. Marked narrowing/stricture of the distal esophagus just above the GE junction. After significant delay, trace contrast passed into the proximal stomach. When correlating with the recent CT, there is no evidence of esophageal mass. However, a fat containing hernia at the esophageal hiatus is present, possibly leading to extrinsic compression. IMPRESSION: Severe stricture of the distal esophagus just above the GE junction. Secondary esophageal dysmotility. When correlating with recent CT, there is no evidence of esophageal mass. Possible extrinsic compression secondary to a fat containing  hernia. Consider endoscopy for further evaluation. Electronically Signed   By: Charline Bills M.D.   On: 07/11/2017 10:21   US Renal  Result Date: 07/09/2017 CLINICAL DATA:  Elevated creatinine EXAM: RENAL / URINARY TRACT ULTRASOUND COMPLETE COMPARISON:  CT 07/08/2017 FINDINGS: Right Kidney: Length: 10.6 cm. Echogenicity within normal limits. No mass or hydronephrosis visualized. Left Kidney: Length: 10.3 cm. Multiple cysts in the left kidney, the largest 4.5 cm. These appear benign. No hydronephrosis. Bladder: Appears normal for degree of bladder distention. IMPRESSION: Left renal cysts. No acute findings.  No hydronephrosis. Electronically Signed   By: Charlett Nose M.D.   On: 07/09/2017 15:56   Dg Abdomen Peg Tube Location  Result Date:  07/19/2017 CLINICAL DATA:  Jejunostomy tube leaking. EXAM: ABDOMEN - 1 VIEW COMPARISON:  07/19/2016. FINDINGS: KUB following nonionic contrast administration into enterostomy tube obtained. Enterostomy tube tip noted within a right-sided abdominal small-bowel loop. No definite leakage noted. Contrast from prior study noted in the colon. IMPRESSION: Enterostomy tube tip noted in a small-bowel loop in the right abdomen. Electronically Signed   By: Maisie Fus  Register   On: 07/19/2017 16:41   Dg Chest Port 1 View  Result Date: 07/27/2017 CLINICAL DATA:  Atelectasis. EXAM: PORTABLE CHEST 1 VIEW COMPARISON:  Radiographs of July 26, 2016. FINDINGS: Stable cardiomediastinal silhouette. Stable right lower lobe opacity is noted concerning for pneumonia. No pneumothorax is noted. Mild left basilar subsegmental atelectasis is noted. Bony thorax is unremarkable. IMPRESSION: Stable right lower lobe pneumonia. Electronically Signed   By: Lupita Raider, M.D.   On: 07/27/2017 08:07   Dg Chest Port 1 View  Result Date: 07/26/2017 CLINICAL DATA:  Thoracentesis. EXAM: PORTABLE CHEST 1 VIEW COMPARISON:  One-view chest x-ray from the same day. FINDINGS: The heart size is normal. Interstitial and airspace disease at the right lung base is slightly improved. There is no pneumothorax. Mild left base airspace disease present. Emphysematous changes are noted. The NG tube was removed. IMPRESSION: 1. Persistent right lower lobe interstitial and airspace disease concerning for pneumonia. 2. Partial clearing of the right upper lobe. 3. Mild atelectasis left base. 4. COPD. 5. Interval removal of NG tube. Electronically Signed   By: Marin Roberts M.D.   On: 07/26/2017 14:03   Dg Chest Port 1 View  Result Date: 07/26/2017 CLINICAL DATA:  72 year old male with obstructing esophageal cancer. Postoperative day 6 status post abdominal surgery for small bowel obstruction at a jejunostomy tube site. EXAM: PORTABLE CHEST 1 VIEW  COMPARISON:  07/25/2017 and earlier. FINDINGS: Portable AP semi upright view at 0454 hours. Extubated. Enteric tube remains in place, but is looped in the distal esophagus level. Yesterday the tube appear to course into the stomach. Continued volume loss in the right lung with mild rightward shift of the mediastinum. Superimposed veiling right lung opacity suggesting pleural effusion. Large left lung volume. The left lung remains clear. Stable mediastinal contours. IMPRESSION: 1. Enteric tube now is looped in the distal esophagus, whereas yesterday tube appeared to course into the stomach. 2. Extubated. 3. Suspect combined lower lobe collapse and pleural effusion in the right lung. Consider mucous plug. 4. The left lung remains clear. Electronically Signed   By: Odessa Fleming M.D.   On: 07/26/2017 07:41   Dg Chest Port 1 View  Result Date: 07/25/2017 CLINICAL DATA:  Respiratory failure EXAM: PORTABLE CHEST 1 VIEW COMPARISON:  Portable chest x-ray of July 24, 2017 FINDINGS: There is increased density in the right mid and lower lung as  compared to yesterday's study. The left lung is clear. The heart and pulmonary vascularity are normal. The endotracheal tube tip lies approximately 3.8 cm above the carina. The esophagogastric tube tip in proximal port project below the GE junction. IMPRESSION: Increased density on the right consistent with worsening atelectasis or pneumonia and likely a posterior layering pleural effusion. Underlying COPD. The support tubes are in reasonable position. Electronically Signed   By: David  [place] M.D.   On: 07/25/2017 06:55   Dg Chest Port 1 View  Result Date: 07/24/2017 CLINICAL DATA:  Follow-up ventilator support. EXAM: PORTABLE CHEST 1 VIEW COMPARISON:  07/23/2017 FINDINGS: Endotracheal tube tip is 4 cm above the carina. Nasogastric tube enters the abdomen. Background emphysema. Left lung is otherwise clear. Persistent pneumonia and volume loss in the right lower lung. I think  there is slight radiographic improvement. IMPRESSION: Slight improvement in lower right lung pneumonia. Electronically Signed   By: Paulina Fusi M.D.   On: 07/24/2017 06:53   Dg Chest Port 1 View  Result Date: 07/23/2017 CLINICAL DATA:  Post intubation and enteric tube placement EXAM: PORTABLE CHEST 1 VIEW COMPARISON:  07/22/2017 FINDINGS: Endotracheal tube placed with tip measuring 3 cm above the carina. Enteric tube tip is coiled in the left upper quadrant consistent with location in the upper stomach. Volume loss and consolidation in the right lung with hyperinflation of the left lung. Probable right pleural effusion. Right lung changes are progressing since the previous study. No pneumothorax. IMPRESSION: Appliances appear in satisfactory position. Progression of consolidation, effusion, and volume loss in the right lung since previous study. Compensatory hyperinflation of the left lung. Electronically Signed   By: Burman Nieves M.D.   On: 07/23/2017 03:51   Dg Chest Port 1 View  Result Date: 07/22/2017 CLINICAL DATA:  Status postextubation.  Airspace consolidation. EXAM: PORTABLE CHEST 1 VIEW COMPARISON:  July 21, 2017 FINDINGS: Endotracheal tube and nasogastric tube have been removed. No pneumothorax. There is airspace consolidation throughout the right mid lower lung zones, increased from 1 day prior. There is a small right pleural effusion. The left lung is somewhat hyperexpanded. There is no edema or consolidation on the left. Heart size and pulmonary vascular normal. No adenopathy appreciable. Old healed fractures noted on the left. IMPRESSION: No pneumothorax. Increase in airspace consolidation in the right mid and lower lung zones with small right pleural effusion. Question progression of pneumonia versus aspiration. Both entities may be present concurrently. Left lung hyperexpanded but clear. Stable cardiac silhouette. Electronically Signed   By: Bretta Bang III M.D.   On: 07/22/2017  07:32   Portable Chest Xray  Result Date: 07/21/2017 CLINICAL DATA:  Endotracheal tube positioning EXAM: PORTABLE CHEST 1 VIEW COMPARISON:  Yesterday FINDINGS: Endotracheal tube tip between the clavicular heads and carina. An orogastric tube reaches the stomach. Lung opacity at the right more than left base. Small pleural effusions by abdominal CT yesterday. Large lung volumes compatible with COPD. IMPRESSION: 1. Stable positioning of endotracheal and orogastric tubes. 2. Small pleural effusions and right more than left lower lobe opacity. Clinical circumstances primarily concerning for aspiration pneumonitis/pneumonia. Electronically Signed   By: Marnee Spring M.D.   On: 07/21/2017 07:07   Portable Chest X-ray  Result Date: 07/20/2017 CLINICAL DATA:  72 year old male with a history of endotracheal tube placement EXAM: PORTABLE CHEST 1 VIEW COMPARISON:  07/20/2017 abdominal CT 07/20/2017 FINDINGS: Cardiomediastinal silhouette unchanged in size and contour. Interval placement of endotracheal tube, terminating suitably above the carina approximately 4 cm.  Interval placement of gastric tube, terminating out of the field of view. Similar appearance of bibasilar opacities, more pronounced on the right. No pneumothorax IMPRESSION: Interval placement of endotracheal tube, terminating suitably above the carina. Interval placement of gastric tube, terminating out of the field of view. Similar appearance of right greater than left airspace disease Electronically Signed   By: Gilmer Mor D.O.   On: 07/20/2017 20:01   Dg Chest Port 1 View  Result Date: 07/19/2017 CLINICAL DATA:  Shortness of breath. EXAM: PORTABLE CHEST 1 VIEW COMPARISON:  CT 07/08/2016.  Chest x-ray 01/13/2016. FINDINGS: Mediastinum hilar structures normal. Cardiomegaly with normal pulmonary vascularity. COPD. Right base infiltrate with small right pleural effusion. IMPRESSION: 1. Right base infiltrate consistent with pneumonia. Small right  pleural effusion. Similar findings noted on prior CT of 07/08/2016. 2.  COPD. Electronically Signed   By: Maisie Fus  Register   On: 07/19/2017 13:51   Dg Abd Acute W/chest  Result Date: 07/08/2017 CLINICAL DATA:  Vomiting for 10 days.  Weight loss. EXAM: DG ABDOMEN ACUTE W/ 1V CHEST COMPARISON:  Chest radiograph 01/13/2016 FINDINGS: The lungs are hyperexpanded with multiple areas of increased lucency. No focal airspace consolidation or pulmonary edema. No pneumothorax or sizable pleural effusion. No free intraperitoneal air. No dilated loops of bowel are visible. The round densities overlying the left psoas muscle may be within the transverse colon. IMPRESSION: 1. COPD without acute airspace disease. 2. No free intraperitoneal air or evidence of small-bowel obstruction. Electronically Signed   By: Deatra Robinson M.D.   On: 07/08/2017 00:07   Dg Abd Portable 1v  Result Date: 07/27/2017 CLINICAL DATA:  Gastroesophageal ca, f/u feeding tube placement, some fullness feeling in stomach EXAM: PORTABLE ABDOMEN - 1 VIEW COMPARISON:  Radiograph 129 19 FINDINGS: Enteric tube projects over the RIGHT lower quadrant. No dilated large or small bowel. Small amount of high-density stool remains in rectum IMPRESSION: No bowel obstruction Electronically Signed   By: Genevive Bi M.D.   On: 07/27/2017 10:13   Dg Abd Portable 1v  Result Date: 07/19/2017 CLINICAL DATA:  COPD, nausea vomiting EXAM: PORTABLE ABDOMEN - 1 VIEW COMPARISON:  None. FINDINGS: No dilated large or small bowel. Jejunostomy tube with tip in the RIGHT lower quadrant. Midline skin staples noted. No evidence bowel obstruction. Oral contrast from prior CT within the colon IMPRESSION: 1. No evidence of bowel obstruction. 2. Jejunostomy tube in place. Electronically Signed   By: Genevive Bi M.D.   On: 07/19/2017 10:34   Dg Kayleen Memos W/o Kub  Result Date: 07/26/2017 CLINICAL DATA:  72 year old male with esophageal cancer. Subsequent encounter. EXAM: UPPER  GI SERIES WITHOUT KUB TECHNIQUE: Routine upper GI series was performed with thin barium. FLUOROSCOPY TIME:  Fluoroscopy Time:  1 minute and 48 seconds Radiation Exposure Index: 5.6 mGy COMPARISON:  07/26/2017 chest x-ray.  07/20/2017 CT. FINDINGS: Patient's recent nasogastric tube was removed by nursing staff prior to coming to radiology. Request for placing nasogastric tube and performing upper GI series and small-bowel follow-through. Procedure discussed with patient. Lidocaine gel utilized. Nasogastric tube gently advanced. Not able to advance into the stomach. Barium instilled. Barium did not traverses into the stomach. Barium was aspirated and nasogastric tube removed. IMPRESSION: Unsuccessful placement of nasogastric tube. Not able to performed upper GI series and small-bowel follow-through as noted above. Results discussed with patient's nurse with request to call covering physician with results. Electronically Signed   By: Lacy Duverney M.D.   On: 07/26/2017 11:48  CBC Recent Labs  Lab 07/25/17 0333 07/26/17 0119 07/27/17 0307 07/28/17 0318 07/30/17 0244  WBC 15.5* 17.4* 14.0* 13.5* 12.5*  HGB 12.5* 12.8* 11.7* 11.3* 10.8*  HCT 38.0* 39.3 34.5* 34.6* 32.8*  PLT 254 305 299 236 297  MCV 89.2 89.9 88.9 89.2 91.4  MCH 29.3 29.3 30.2 29.1 30.1  MCHC 32.9 32.6 33.9 32.7 32.9  RDW 16.4* 16.3* 16.2* 16.3* 16.2*    Chemistries  Recent Labs  Lab 07/24/17 2259 07/25/17 0333 07/26/17 0119 07/27/17 0307 07/28/17 0318 07/30/17 0244  NA 141 141 138 140 140 140  K 3.8 3.9 4.2 3.8 3.8 4.3  CL 107 108 103 105 103 96*  CO2 29 28 28  32 33* 37*  GLUCOSE 132* 113* 87 166* 107* 148*  BUN 10 11 10 14 13 15   CREATININE 0.69 0.67 0.61 0.78 0.71 0.61  CALCIUM 8.0* 7.9* 8.3* 8.0* 7.7* 7.9*  MG 1.9 1.8 1.6* 2.4  --   --   AST  --   --  16 36 41  --   ALT  --   --  15* 22 33  --   ALKPHOS  --   --  77 94 99  --   BILITOT  --   --  0.8 0.5 0.3  --     ------------------------------------------------------------------------------------------------------------------ No results for input(s): CHOL, HDL, LDLCALC, TRIG, CHOLHDL, LDLDIRECT in the last 72 hours.  No results found for: HGBA1C ------------------------------------------------------------------------------------------------------------------ No results for input(s): TSH, T4TOTAL, T3FREE, THYROIDAB in the last 72 hours.  Invalid input(s): FREET3 ------------------------------------------------------------------------------------------------------------------ No results for input(s): VITAMINB12, FOLATE, FERRITIN, TIBC, IRON, RETICCTPCT in the last 72 hours.  Coagulation profile No results for input(s): INR, PROTIME in the last 168 hours.  No results for input(s): DDIMER in the last 72 hours.  Cardiac Enzymes Recent Labs  Lab 07/25/17 1344 07/25/17 1916 07/26/17 0119  TROPONINI <0.03 <0.03 <0.03   ------------------------------------------------------------------------------------------------------------------ No results found for: BNP   Mehul Rudin M.D on 07/31/2017 at 4:32 PM  Between 7am to 7pm - Pager - [phone]  After 7pm go to [email or web address] - password [email or web address]  [facility] -  Office  [phone]  Voice Recognition Reubin Milan dictation system was used to create this note, attempts have been made to correct errors. Please contact the author with questions and/or clarifications.

## 2017-08-01 ENCOUNTER — Ambulatory Visit
Admit: 2017-08-01 | Discharge: 2017-08-01 | Disposition: A | Discharge status: Home / Self Care | Payer: Medicare Other | Attending: Radiation Oncology | Admitting: Radiation Oncology

## 2017-08-01 ENCOUNTER — Telehealth: Payer: Self-pay | Admitting: Oncology

## 2017-08-01 ENCOUNTER — Other Ambulatory Visit: Payer: Self-pay | Admitting: *Deleted

## 2017-08-01 ENCOUNTER — Ambulatory Visit: Payer: Medicare Other | Admitting: Radiation Oncology

## 2017-08-01 DIAGNOSIS — C160 Malignant neoplasm of cardia: Secondary | ICD-10-CM | POA: Diagnosis not present

## 2017-08-01 DIAGNOSIS — L8990 Pressure ulcer of unspecified site, unspecified stage: Secondary | ICD-10-CM

## 2017-08-01 DIAGNOSIS — C159 Malignant neoplasm of esophagus, unspecified: Secondary | ICD-10-CM

## 2017-08-01 DIAGNOSIS — Z5111 Encounter for antineoplastic chemotherapy: Secondary | ICD-10-CM

## 2017-08-01 MED ORDER — EPINEPHRINE PF 1 MG/ML IJ SOLN
0.5000 mg | Freq: Once | INTRAMUSCULAR | Status: DC | PRN
  Filled 2017-08-01: qty 1

## 2017-08-01 MED ORDER — SODIUM CHLORIDE 0.9 % IV SOLN
167.6000 mg | Freq: Once | INTRAVENOUS | Status: AC
  Administered 2017-08-01: 170 mg via INTRAVENOUS
  Filled 2017-08-01: qty 17

## 2017-08-01 MED ORDER — EPINEPHRINE PF 1 MG/10ML IJ SOSY: 0.2500 mg | PREFILLED_SYRINGE | Freq: Once | INTRAMUSCULAR | Status: DC | PRN

## 2017-08-01 MED ORDER — DEXAMETHASONE SODIUM PHOSPHATE 100 MG/10ML IJ SOLN
20.0000 mg | Freq: Once | INTRAMUSCULAR | Status: AC
  Administered 2017-08-01: 20 mg via INTRAVENOUS
  Filled 2017-08-01: qty 2

## 2017-08-01 MED ORDER — DIPHENHYDRAMINE HCL 50 MG/ML IJ SOLN
25.0000 mg | Freq: Once | INTRAMUSCULAR | Status: AC
  Administered 2017-08-01: 25 mg via INTRAVENOUS
  Filled 2017-08-01: qty 1

## 2017-08-01 MED ORDER — PALONOSETRON HCL INJECTION 0.25 MG/5ML
0.2500 mg | Freq: Once | INTRAVENOUS | Status: AC
  Administered 2017-08-01: 0.25 mg via INTRAVENOUS
  Filled 2017-08-01: qty 5

## 2017-08-01 MED ORDER — ALBUTEROL SULFATE (2.5 MG/3ML) 0.083% IN NEBU: 2.5000 mg | INHALATION_SOLUTION | Freq: Once | RESPIRATORY_TRACT | Status: DC | PRN

## 2017-08-01 MED ORDER — DIPHENHYDRAMINE HCL 50 MG/ML IJ SOLN: 25.0000 mg | Freq: Once | INTRAMUSCULAR | Status: DC | PRN

## 2017-08-01 MED ORDER — PACLITAXEL CHEMO INJECTION 300 MG/50ML
50.0000 mg/m2 | Freq: Once | INTRAVENOUS | Status: AC
  Administered 2017-08-01: 84 mg via INTRAVENOUS
  Filled 2017-08-01 (×2): qty 14

## 2017-08-01 MED ORDER — METHYLPREDNISOLONE SODIUM SUCC 125 MG IJ SOLR: 125.0000 mg | Freq: Once | INTRAMUSCULAR | Status: DC | PRN

## 2017-08-01 MED ORDER — HEPARIN SOD (PORK) LOCK FLUSH 100 UNIT/ML IV SOLN: 500.0000 [IU] | Freq: Once | INTRAVENOUS | Status: DC | PRN

## 2017-08-01 MED ORDER — SODIUM CHLORIDE 0.9 % IV SOLN: Freq: Once | INTRAVENOUS | Status: DC | PRN

## 2017-08-01 MED ORDER — FAMOTIDINE IN NACL 20-0.9 MG/50ML-% IV SOLN
20.0000 mg | Freq: Once | INTRAVENOUS | Status: AC
  Administered 2017-08-01: 20 mg via INTRAVENOUS
  Filled 2017-08-01: qty 50

## 2017-08-01 MED ORDER — HEPARIN SOD (PORK) LOCK FLUSH 100 UNIT/ML IV SOLN: 250.0000 [IU] | Freq: Once | INTRAVENOUS | Status: DC | PRN

## 2017-08-01 MED ORDER — COLD PACK MISC ONCOLOGY
1.0000 | Freq: Once | Status: AC | PRN
  Filled 2017-08-01: qty 1

## 2017-08-01 MED ORDER — ALTEPLASE 2 MG IJ SOLR
2.0000 mg | Freq: Once | INTRAMUSCULAR | Status: DC | PRN
  Filled 2017-08-01: qty 2

## 2017-08-01 MED ORDER — SODIUM CHLORIDE 0.9% FLUSH: 3.0000 mL | INTRAVENOUS | Status: DC | PRN

## 2017-08-01 MED ORDER — SODIUM CHLORIDE 0.9 % IV SOLN: Freq: Once | INTRAVENOUS | Status: DC

## 2017-08-01 MED ORDER — SODIUM CHLORIDE 0.9% FLUSH: 10.0000 mL | INTRAVENOUS | Status: DC | PRN

## 2017-08-01 MED ORDER — FAMOTIDINE IN NACL 20-0.9 MG/50ML-% IV SOLN
20.0000 mg | Freq: Once | INTRAVENOUS | Status: DC | PRN
  Filled 2017-08-01: qty 50

## 2017-08-01 NOTE — Progress Notes (Signed)
IP PROGRESS NOTE  Subjective:   George Stokes appears unchanged.  He reports tolerating the tube feedings.  He is unable to swallow liquids. Objective: Vital signs in last 24 hours: Blood pressure 115/73, pulse 74, temperature 98.2 F (36.8 C), temperature source Oral, resp. rate 12, height 5' 7.25" (1.708 m), weight 131 lb 5 oz (59.6 kg), SpO2 92 %.  Intake/Output from previous day: 02/03 0701 - 02/04 0700 In: -  Out: 4540 [Urine:1375]  Physical Exam: HEENT: No thrush Lungs: Distant breath sounds Cardiac: Regular rate and rhythm Abdomen: Healing midline incision, left abdomen feeding tube site without evidence of infection, the abdomen is soft Extremities: No leg edema   Portacath/PICC-without erythema  Lab Results: Recent Labs    07/30/17 0244  WBC 12.5*  HGB 10.8*  HCT 32.8*  PLT 297    BMET Recent Labs    07/30/17 0244  NA 140  K 4.3  CL 96*  CO2 37*  GLUCOSE 148*  BUN 15  CREATININE 0.61  CALCIUM 7.9*    Lab Results  Component Value Date   CEA1 2.8 07/15/2017    Medications: I have reviewed the patient's current medications.  Assessment/Plan: 1.  Esophagus cancer  Distal esophageal stricture noted on esophagram 07/11/2017  CTs of the chest, abdomen, and pelvis 07/08/2017-right lung pneumonia, emphysema, no evidence of metastatic disease  Upper endoscopy 07/13/2017- GE junction mass, could not be passed with standard endoscope, biopsy suspicious for adenocarcinoma  Right pleural fluid cytology 07/26/2017-negative  Initiation of weekly Taxol/carboplatin and radiation 08/01/2017  2.   Solid/liquid dysphagia secondary to #1  Placement of jejunostomy feeding tube 07/15/2017, revised 07/20/2017  3.   Weight loss/malnutrition, maintained on jejunostomy tube feedings  4.   COPD  5.   Heavy alcohol use  6.   Gout  7.   Hypertension  8.   Pneumonia  George Stokes appears stable.  The plan is to begin concurrent chemotherapy and radiation today.   I reviewed potential toxicities associated with Taxol/carboplatin and he agrees to proceed.  He will be weighed prior to chemotherapy today. He will complete 5 weeks of radiation with concurrent weekly chemotherapy.  Outpatient follow-up will be scheduled at the Cancer center.    LOS: 24 days   Betsy Coder, MD   08/01/2017, 9:52 AM

## 2017-08-01 NOTE — Progress Notes (Signed)
Physical Therapy Treatment Patient Details Name: George Stokes MRN: [id number] DOB: 1945/08/02 Today's Date: 08/01/2017    History of Present Illness 72 y.o. male with medical history significant of COPD, CRF on 4 liters he thinks at home, htn comes in with over a week of nausea and vomiting.  EGD performed 07/13/17 found malignant appearing esophageal stenosis - biopsy sent.   Dx of PNA    PT Comments    Patient with decreased endurance with ambulation this session.  Was asleep prior to therapy, but participated without needing encouragement.  Remains weak and appropriate for SNF level rehab.  PT to follow acutely.   Follow Up Recommendations  SNF;Supervision/Assistance - 24 hour     Equipment Recommendations  Rolling walker with 5" wheels;3in1 (PT)    Recommendations for Other Services       Precautions / Restrictions Precautions Precautions: Fall Precaution Comments: NPO feeding tube    Mobility  Bed Mobility Overal bed mobility: Needs Assistance Bed Mobility: Supine to Sit     Supine to sit: Min assist     General bed mobility comments: pt reached for a hand to help him sit up  Transfers Overall transfer level: Needs assistance Equipment used: Rolling walker (2 wheeled) Transfers: Sit to/from Stand Sit to Stand: Min assist         General transfer comment: cues for hand placement  Ambulation/Gait Ambulation/Gait assistance: Min assist Ambulation Distance (Feet): 100 Feet Assistive device: Rolling walker (2 wheeled) Gait Pattern/deviations: Step-through pattern;Trunk flexed;Drifts right/left     General Gait Details: cues for walker management, assist for balance, limited endurance on 3L O2   Stairs            Wheelchair Mobility    Modified Rankin (Stroke Patients Only)       Balance Overall balance assessment: Needs assistance   Sitting balance-Leahy Scale: Fair     Standing balance support: Bilateral upper extremity supported Standing  balance-Leahy Scale: Poor Standing balance comment: UE support for balance                            Cognition Arousal/Alertness: Awake/alert Behavior During Therapy: Flat affect Overall Cognitive Status: Within Functional Limits for tasks assessed                                 General Comments: impulsive VC's for safety      Exercises      General Comments General comments (skin integrity, edema, etc.): brother in law in room and reports to start chemo and XRT today      Pertinent Vitals/Pain Pain Assessment: No/denies pain    Home Living                      Prior Function            PT Goals (current goals can now be found in the care plan section) Progress towards PT goals: Not progressing toward goals - comment    Frequency    Min 3X/week      PT Plan Current plan remains appropriate    Co-evaluation              AM-PAC PT "6 Clicks" Daily Activity  Outcome Measure  Difficulty turning over in bed (including adjusting bedclothes, sheets and blankets)?: A Little Difficulty moving from lying on back to sitting on the  side of the bed? : Unable Difficulty sitting down on and standing up from a chair with arms (e.g., wheelchair, bedside commode, etc,.)?: Unable Help needed moving to and from a bed to chair (including a wheelchair)?: A Little Help needed walking in hospital room?: A Little Help needed climbing 3-5 steps with a railing? : Total 6 Click Score: 12    End of Session Equipment Utilized During Treatment: Gait belt Activity Tolerance: Patient limited by fatigue Patient left: in chair;with call bell/phone within reach;with family/visitor present   PT Visit Diagnosis: Muscle weakness (generalized) (M62.81);Difficulty in walking, not elsewhere classified (R26.2)     Time: 1610-9604 PT Time Calculation (min) (ACUTE ONLY): 18 min  Charges:  $Gait Training: 8-22 mins                    G CodesSheran Stokes,  [phone] 08/01/2017    George Stokes 08/01/2017, 12:47 PM

## 2017-08-01 NOTE — Progress Notes (Signed)
George Stokes Demographics:    George Stokes, is a 72 y.o. male, DOB - [birth year], NUU:[id number]  Admit date - 07/07/2017   Admitting Physician Hillary Bow, DO  Outpatient Primary MD for the George Stokes is George Stokes, No Pcp Per LOS - 24  Chief Complaint  George Stokes presents with  . Emesis       Subjective:    [name] today has no fevers, no emesis,  No chest pain, tolerating tube feed well, no new concerns  Assessment  & Plan :    Principal Problem:   Adenocarcinoma of esophagus (HCC) Active Problems:   Essential hypertension   COPD with hypoxia (HCC)   Chronic respiratory failure with hypoxia (HCC)   Intractable nausea and vomiting   CAP (community acquired pneumonia)   Lobar pneumonia (HCC)   Hypomagnesemia   Hypokalemia   Protein-calorie malnutrition, severe (HCC)   Esophageal dysphagia   Esophageal mass   Esophageal stricture   Esophageal cancer (HCC)   Malnutrition of moderate degree   H/O exploratory laparotomy   Acute respiratory failure (HCC)   Atelectasis of right lung   Pressure injury of skin  Brief Summary:- 72 y.o. male reformed smoker with COPD, Chronic Hypoxic Resp Failure  on 4 liters , HTN and history of alcohol abuse admitted on 07/08/17 with N/V and a 20 pound weight loss.  S/P J tube placement, with complicated course due to SBO and leakage out of J tube. S/P Ex-Lap and jejunostomy revision. EGD performed 07/13/17 found malignant appearing esophageal stenosis/ annular adenocarcinoma (pathology/cytology was not definitive for malignancy), awaiting radiation and chemotherapy,  On 07/15/17  Had placement of a jejunostomy feeding tube ,  He developed an obstruction at the site of the feeding tube 07/20/2017 and was taken to the operating room for revision.  Following this procedure he had prolonged intubation and was extubated 07/25/2017. He underwent a right thoracentesis 07/26/2017, he  cytology revealed acute inflammation (no malignant cells)     Plan:- 1)GE junction malignant esophageal stricture/ annular adenocarcinoma-input from Dr Christella Hartigan (Gi) and Dr Truett Perna (oncology) appreciated, please note there is no definitive/confirmatory histological diagnosis at this time. Pt is to start 5 weeks of daily (Monday thru Friday) XRT and concurrent once weekly chemoRx (Taxol/carboplatin)  on  08/01/17,  w/ XRT the esophageal stricture may improve. Would continue strict NPO, continue  Protonix  2)FEN-tolerating tube feed well, n.p.o.,c/n Osmolite 1.5 @ 60 mL/hr via J-tube which will provides 2160 kcal, 90 grams of protein, and 1097 mL free water.,  Continue 50 mL free water every 4 hours via J-tube (300 mL/day), c/n thiamine and folic acid/multivitamin the George Stokes with history of alcohol abuse, no frank emesis.  George Stokes is still unable to tolerate oral intake  3) Candida respiratory infection- BAL cultures growing Candida albicans, discussed with Dr. Marchelle Gearing, okay to treat with Diflucan for 1 week (first dose 07/28/2017)  4)HTN-stable, continue metoprolol 25 mg twice daily  5)Social/Ethics- Full Code,    6)Disposition-George Stokes has significant post acute illness debility/weakness and poor endurance, physical therapy evaluation and social work consult appreciated, awaiting insurance approval for placement to skilled nursing facility rehab (pt needs  5 weeks of daily (Monday thru Friday) XRT and concurrent once weekly chemoRx (Taxol/carboplatin)  Starting on  08/01/17),  rehab facility will have to arrange for daily transportation to cancer Center for radiation therapy Monday through Fridays  7)Moderate to Severe Protein Caloric Malnutrition- Nutritional supplements as above #2    Consults  :  Gi/PCCMM/Oncology ([place])  Procedures:-  EGD from 07/13/2017 with malignant esophageal stricture (biopsy not conclusive) J-tube placed on 07/15/2017 Revision of J-tube on 07/20/2017 Initially  extubated 07/21/2017 Reintubated 07/23/2017 Extubated 07/25/2017 NG tube placement by interventional radiology on 07/26/2017   DVT Prophylaxis  :  Heparin/ SCDs  Lab Results  Component Value Date   PLT 297 07/30/2017    Inpatient Medications  Scheduled Meds: . CARBOplatin  170 mg Intravenous Once  . Chlorhexidine Gluconate Cloth  6 each Topical Daily  . free water  50 mL Per Tube Q4H  . heparin  5,000 Units Subcutaneous Q8H  . ipratropium-albuterol  3 mL Nebulization TID  . metoprolol tartrate  25 mg Per Tube BID  . multivitamin  15 mL Oral Daily  . neomycin-bacitracin-polymyxin   Topical Once  . pantoprazole (PROTONIX) IV  40 mg Intravenous Q12H  . thiamine injection  100 mg Intravenous Daily   Continuous Infusions: . sodium chloride 250 mL (08/01/17 0954)  . sodium chloride    . sodium chloride    . famotidine    . feeding supplement (OSMOLITE 1.5 CAL) 1,000 mL (07/31/17 1651)  . fluconazole (DIFLUCAN) IV Stopped (08/01/17 1054)   PRN Meds:.sodium chloride, sodium chloride, acetaminophen, albuterol, alteplase, Cold Pack, diphenhydrAMINE, diphenhydrAMINE, EPINEPHrine, EPINEPHrine, EPINEPHrine, EPINEPHrine, famotidine, heparin lock flush, heparin lock flush, hydrALAZINE, indomethacin, methylPREDNISolone sodium succinate, metoprolol tartrate, ondansetron **OR** ondansetron (ZOFRAN) IV, sodium chloride flush, sodium chloride flush, sodium chloride flush    Anti-infectives (From admission, onward)   Start     Dose/Rate Route Frequency Ordered Stop   07/29/17 0800  fluconazole (DIFLUCAN) IVPB 100 mg  Status:  Discontinued     100 mg 50 mL/hr over 60 Minutes Intravenous Every 24 hours 07/28/17 0840 07/28/17 0841   07/29/17 0800  fluconazole (DIFLUCAN) IVPB 100 mg     100 mg 50 mL/hr over 60 Minutes Intravenous Every 24 hours 07/28/17 0841 08/05/17 0759   07/28/17 0845  fluconazole (DIFLUCAN) IVPB 200 mg     200 mg 100 mL/hr over 60 Minutes Intravenous  Once 07/28/17 0840  07/28/17 1030   07/27/17 1000  fluconazole (DIFLUCAN) 40 MG/ML suspension 200 mg  Status:  Discontinued     200 mg Per Tube Daily 07/26/17 0925 07/26/17 1100   07/26/17 1000  fluconazole (DIFLUCAN) 40 MG/ML suspension 400 mg  Status:  Discontinued     400 mg Per Tube  Once 07/26/17 0924 07/26/17 1100   07/20/17 1400  ertapenem (INVANZ) 1 g in sodium chloride 0.9 % 50 mL IVPB  Status:  Discontinued     1 g 100 mL/hr over 30 Minutes Intravenous Every 24 hours 07/20/17 1209 07/29/17 0940   07/16/17 0000  ceFAZolin (ANCEF) IVPB 2g/100 mL premix     2 g 200 mL/hr over 30 Minutes Intravenous Every 8 hours 07/15/17 1833 07/16/17 0054   07/15/17 0945  amoxicillin-clavulanate (AUGMENTIN) 250-62.5 MG/5ML suspension 500 mg     500 mg Oral Every 8 hours 07/15/17 0938 07/19/17 0928   07/15/17 0600  ceFAZolin (ANCEF) IVPB 2g/100 mL premix     2 g 200 mL/hr over 30 Minutes Intravenous On call to O.R. 07/14/17 1809 07/15/17 1632   07/11/17 1600  ampicillin-sulbactam (UNASYN) 1.5 g in sodium chloride 0.9 % 50 mL IVPB  Status:  Discontinued     1.5 g 100 mL/hr over 30 Minutes Intravenous Every 6 hours 07/11/17 1329 07/15/17 0940   07/08/17 1000  cefTRIAXone (ROCEPHIN) 1 g in dextrose 5 % 50 mL IVPB  Status:  Discontinued     1 g 100 mL/hr over 30 Minutes Intravenous Every 24 hours 07/08/17 0954 07/11/17 1315   07/08/17 1000  azithromycin (ZITHROMAX) 500 mg in dextrose 5 % 250 mL IVPB  Status:  Discontinued     500 mg 250 mL/hr over 60 Minutes Intravenous Every 24 hours 07/08/17 0954 07/11/17 1315        Objective:   Vitals:   08/01/17 1336 08/01/17 1500 08/01/17 1505 08/01/17 1605  BP: 126/73 119/67 115/70 117/75  Pulse: 84 85 87 78  Resp: 16 16 16 18   Temp: 98 F (36.7 C) (!) 97.5 F (36.4 C) 98.2 F (36.8 C) 97.6 F (36.4 C)  TempSrc: Oral Oral    SpO2: 93%   98%  Weight:      Height:        Wt Readings from Last 3 Encounters:  08/01/17 59.6 kg (131 lb 5 oz)  06/29/17 57.2 kg (126 lb)   01/04/17 72.9 kg (160 lb 12.8 oz)     Intake/Output Summary (Last 24 hours) at 08/01/2017 1715 Last data filed at 08/01/2017 1338 Gross per 24 hour  Intake -  Output 1725 ml  Net -1725 ml    Physical Exam  Gen:- Awake Alert, George Stokes looks emanciated in no apparent distress  HEENT:- [place].AT, No sclera icterus Neck-Supple Neck,No JVD, Nose- [place] 2 L/min Lungs-diminished in bases CV- S1, S2 normal Abd-  +ve B.Sounds, Abd Soft, No tenderness, J-tube site is clean dry and intact,    Extremity/Skin:- No  edema,    Neuro-no tremors, generalized weakness without new focal deficits Psych-affect is appropriate   Data Review:   Micro Results Recent Results (from the past 240 hour(s))  Culture, bal-quantitative     Status: Abnormal   Collection Time: 07/23/17  9:30 AM  Result Value Ref Range Status   Specimen Description BRONCHIAL ALVEOLAR LAVAGE  Final   Special Requests NONE  Final   Gram Stain   Final    ABUNDANT WBC PRESENT, PREDOMINANTLY PMN RARE SQUAMOUS EPITHELIAL CELLS PRESENT FEW BUDDING YEAST SEEN Performed at [facility], [address] [place], [place] [address]    Culture 50,000 COLONIES/mL CANDIDA ALBICANS (A)  Final   Report Status 07/25/2017 FINAL  Final  Body fluid culture     Status: None   Collection Time: 07/26/17  1:00 PM  Result Value Ref Range Status   Specimen Description   Final    PLEURAL Performed at [facility], [address] [place], [place] [address]    Special Requests   Final    Normal Performed at [facility], [address], [place], [place] [address]    Gram Stain   Final    WBC PRESENT, PREDOMINANTLY PMN NO ORGANISMS SEEN CYTOSPIN SMEAR    Culture   Final    No growth aerobically or anaerobically. Performed at [facility], [address], [place], [place] [address]    Report Status 07/30/2017 FINAL  Final    Radiology Reports Ct Abdomen Pelvis Wo Contrast  Result Date:  07/08/2017 CLINICAL DATA:  72 year old male with nausea vomiting. EXAM: CT CHEST, ABDOMEN AND PELVIS WITHOUT CONTRAST TECHNIQUE: Multidetector CT imaging of the chest, abdomen and pelvis  was performed following the standard protocol without IV contrast. COMPARISON:  Radiograph of the chest abdomen pelvis dated 07/07/2017 FINDINGS: Evaluation of this exam is limited in the absence of intravenous contrast. CT CHEST FINDINGS Cardiovascular: There is no cardiomegaly. Coronary vascular calcification primarily involving the LAD. Small anterior pericardial effusion measuring 8 mm in thickness. There is mild atherosclerotic calcification of the thoracic aorta. The thoracic aorta and central pulmonary arteries are grossly unremarkable on this noncontrast CT. Mediastinum/Nodes: No hilar or mediastinal adenopathy. The esophagus contains a mixed density fluid content with areas of higher attenuation, likely residual oral contrast. Findings likely represent reflux or esophageal dysmotility and delayed emptying. Blood product is less likely. Clinical correlation is recommended. Endoscopy may provide better evaluation of the esophagus if clinically indicated. Lungs/Pleura: There is emphysematous changes of the lungs. Patchy area of nodular airspace density primarily involving the right middle lobe and anterior right upper lobe most consistent with pneumonia. Clinical correlation and follow-up to resolution recommended. There is mild bronchiectatic changes. No pneumothorax or pleural effusion. Endobronchial content in the region of the carina and proximal portion of the left mainstem bronchus likely mucous plugging. Musculoskeletal: There is degenerative changes of the spine. Old healed left rib fractures. No acute osseous pathology. CT ABDOMEN PELVIS FINDINGS No intra-abdominal free air or free fluid. Hepatobiliary: The liver is unremarkable. No intrahepatic biliary ductal dilatation. There is probable sludge or small stones  within the gallbladder. No pericholecystic fluid or evidence of acute cholecystitis by CT. Pancreas: Unremarkable. No pancreatic ductal dilatation or surrounding inflammatory changes. Spleen: Normal in size without focal abnormality. Adrenals/Urinary Tract: The adrenal glands are unremarkable. Multiple bilateral renal hypodense lesions are not characterized on this CT but appear to demonstrate fluid attenuation. Ultrasound may provide better characterisation. Punctate nonobstructing right renal calculi versus vascular calcification. There is no hydronephrosis or obstructing stone on either side. The visualized ureters and urinary bladder appear unremarkable. Stomach/Bowel: There is a small hiatal hernia. Oral contrast opacifies multiple loops of small bowel. There is no evidence of bowel obstruction or active inflammation. There is sigmoid diverticulosis without active inflammation. Normal appendix. Vascular/Lymphatic: Advanced aortoiliac atherosclerotic disease. The IVC is grossly unremarkable. No portal venous gas. There is no adenopathy. Reproductive: The prostate and seminal vesicles are grossly unremarkable. Other: None Musculoskeletal: Osteopenia with degenerative changes of the lower lumbar spine. No acute osseous pathology. IMPRESSION: 1. Patchy areas of nodular airspace density primarily in the right middle lobe and right upper lobe most consistent with pneumonia. Clinical correlation and follow-up to resolution after treatment recommended. 2. Emphysema.  No pleural effusion or pneumothorax. 3. Mucous plugging in the proximal left mainstem bronchus. 4. Sigmoid diverticulosis. No bowel obstruction or active inflammation. Normal appendix. 5. Bilateral renal hypodense lesions, incompletely characterized. Ultrasound may provide better evaluation. No hydronephrosis or obstructing stone. 6. Aortic Atherosclerosis (ICD10-I70.0) and Emphysema (ICD10-J43.9). Electronically Signed   By: Elgie Collard M.D.   On:  07/08/2017 05:47   Dg Chest 1 View  Result Date: 07/20/2017 CLINICAL DATA:  Shortness of breath, weakness. History of esophageal malignancy, COPD on home oxygen, former smoker. EXAM: CHEST 1 VIEW COMPARISON:  Chest x-ray of July 19, 2017 FINDINGS: There is persistent infiltrate at the right lung base. There no large pleural effusion. The left lung is clear. The heart and pulmonary vascularity are normal. There is mild tortuosity of the ascending thoracic aorta. The bony thorax is unremarkable. IMPRESSION: COPD. Right basilar pneumonia little changed from yesterday's study. Electronically Signed   By: [name]  [place] M.D.   On: 07/20/2017 15:12   Dg Abd 1 View  Result Date: 07/26/2017 CLINICAL DATA:  NG tube advancement EXAM: ABDOMEN - 1 VIEW COMPARISON:  07/25/2017 FINDINGS: NG tube looped in the distal esophagus. Right lower lobe opacity with small right pleural effusion. Skin staples overlying the upper abdomen. IMPRESSION: NG tube looped in the distal esophagus. These results will be called to the ordering clinician or representative by the Radiologist Assistant, and communication documented in the PACS or zVision Dashboard. Electronically Signed   By: Charline Bills M.D.   On: 07/26/2017 09:32   Dg Abd 1 View  Result Date: 07/25/2017 CLINICAL DATA:  Status post NG tube placement today. EXAM: ABDOMEN - 1 VIEW COMPARISON:  CT abdomen and pelvis 07/20/2017. Single-view of the chest 07/25/2017. FINDINGS: The George Stokes has 2 NG tubes in place. Tip and side-port of 1 of the tubes are in the stomach. Tip of the second tube is at the gastroesophageal junction and should be advanced 7-8 cm. Endotracheal tube is unchanged and in good position. Lungs are emphysematous. Right basilar airspace disease and effusion are unchanged. Heart size is normal. No pneumothorax. IMPRESSION: Two NG tubes are identified. One is in good position. Tip of the second is at gastroesophageal junction and should be advanced 7-8 cm.  Endotracheal tube in good position. No change in a right effusion and airspace disease. Emphysema. Electronically Signed   By: Drusilla Kanner M.D.   On: 07/25/2017 10:20   Ct Chest Wo Contrast  Result Date: 07/08/2017 CLINICAL DATA:  72 year old male with nausea vomiting. EXAM: CT CHEST, ABDOMEN AND PELVIS WITHOUT CONTRAST TECHNIQUE: Multidetector CT imaging of the chest, abdomen and pelvis was performed following the standard protocol without IV contrast. COMPARISON:  Radiograph of the chest abdomen pelvis dated 07/07/2017 FINDINGS: Evaluation of this exam is limited in the absence of intravenous contrast. CT CHEST FINDINGS Cardiovascular: There is no cardiomegaly. Coronary vascular calcification primarily involving the LAD. Small anterior pericardial effusion measuring 8 mm in thickness. There is mild atherosclerotic calcification of the thoracic aorta. The thoracic aorta and central pulmonary arteries are grossly unremarkable on this noncontrast CT. Mediastinum/Nodes: No hilar or mediastinal adenopathy. The esophagus contains a mixed density fluid content with areas of higher attenuation, likely residual oral contrast. Findings likely represent reflux or esophageal dysmotility and delayed emptying. Blood product is less likely. Clinical correlation is recommended. Endoscopy may provide better evaluation of the esophagus if clinically indicated. Lungs/Pleura: There is emphysematous changes of the lungs. Patchy area of nodular airspace density primarily involving the right middle lobe and anterior right upper lobe most consistent with pneumonia. Clinical correlation and follow-up to resolution recommended. There is mild bronchiectatic changes. No pneumothorax or pleural effusion. Endobronchial content in the region of the carina and proximal portion of the left mainstem bronchus likely mucous plugging. Musculoskeletal: There is degenerative changes of the spine. Old healed left rib fractures. No acute osseous  pathology. CT ABDOMEN PELVIS FINDINGS No intra-abdominal free air or free fluid. Hepatobiliary: The liver is unremarkable. No intrahepatic biliary ductal dilatation. There is probable sludge or small stones within the gallbladder. No pericholecystic fluid or evidence of acute cholecystitis by CT. Pancreas: Unremarkable. No pancreatic ductal dilatation or surrounding inflammatory changes. Spleen: Normal in size without focal abnormality. Adrenals/Urinary Tract: The adrenal glands are unremarkable. Multiple bilateral renal hypodense lesions are not characterized on this CT but appear to demonstrate fluid attenuation. Ultrasound may provide better characterisation. Punctate nonobstructing right renal calculi versus vascular calcification. There  is no hydronephrosis or obstructing stone on either side. The visualized ureters and urinary bladder appear unremarkable. Stomach/Bowel: There is a small hiatal hernia. Oral contrast opacifies multiple loops of small bowel. There is no evidence of bowel obstruction or active inflammation. There is sigmoid diverticulosis without active inflammation. Normal appendix. Vascular/Lymphatic: Advanced aortoiliac atherosclerotic disease. The IVC is grossly unremarkable. No portal venous gas. There is no adenopathy. Reproductive: The prostate and seminal vesicles are grossly unremarkable. Other: None Musculoskeletal: Osteopenia with degenerative changes of the lower lumbar spine. No acute osseous pathology. IMPRESSION: 1. Patchy areas of nodular airspace density primarily in the right middle lobe and right upper lobe most consistent with pneumonia. Clinical correlation and follow-up to resolution after treatment recommended. 2. Emphysema.  No pleural effusion or pneumothorax. 3. Mucous plugging in the proximal left mainstem bronchus. 4. Sigmoid diverticulosis. No bowel obstruction or active inflammation. Normal appendix. 5. Bilateral renal hypodense lesions, incompletely characterized.  Ultrasound may provide better evaluation. No hydronephrosis or obstructing stone. 6. Aortic Atherosclerosis (ICD10-I70.0) and Emphysema (ICD10-J43.9). Electronically Signed   By: Elgie Collard M.D.   On: 07/08/2017 05:47   Ct Abdomen Pelvis W Contrast  Result Date: 07/20/2017 CLINICAL DATA:  Recently diagnosed esophageal cancer. Abdominal distention. Recent jejunostomy (5 days postop) tube with fluid leak concerning for ascites EXAM: CT ABDOMEN AND PELVIS WITH CONTRAST TECHNIQUE: Multidetector CT imaging of the abdomen and pelvis was performed using the standard protocol following bolus administration of intravenous contrast. CONTRAST:  100 cc Isovue-300 intravenous COMPARISON:  07/08/2017 FINDINGS: Lower chest: Airspace opacity in the right lower lobe. Improved airspace disease in the right middle lobe. Suspect aspiration given the chronically distended esophagus above the GE junction mass. Small bilateral pleural effusion. Hepatobiliary: Geographic low-density in segment 5 and 8 of the liver without visible underlying lesions seen. Negative gallbladder. Pancreas: Unremarkable. Spleen: Unremarkable. Adrenals/Urinary Tract: Negative adrenals. No hydronephrosis or stone. Moderate scarring involving the upper pole left renal cortex. Bilateral simple appearing renal cysts. Unremarkable bladder. Stomach/Bowel: Recent jejunostomy in unremarkable position. The bowel is kinked at the level of the enterotomy and there is marked proximal small bowel and stomach distension. Small volume pneumoperitoneum which is expected after recent surgery. Distended lower esophagus above a GE junction mass. Colonic diverticulosis. Vascular/Lymphatic: Atherosclerosis with advanced atheromatous wall thickening of the aorta. No definite adenopathy. PET-CT would be more sensitive in this setting. Reproductive:No pathologic findings. Other: No ascites or pneumoperitoneum. Musculoskeletal: No acute abnormalities. A call has been placed to  the ordering provider. IMPRESSION: 1. High-grade proximal small bowel obstruction with transition at the jejunostomy tube. There is a degree of closed loop physiology given the GE junction mass and severe stenosis. 2. Dilated esophagus above the GE junction mass. There is shifting opacity at the right base concerning for recurrent aspiration. 3. Small pleural effusions. 4. Perfusion anomaly in the ventral right lobe liver without visible underlying cause. Attention on future staging scans. Electronically Signed   By: Marnee Spring M.D.   On: 07/20/2017 15:07   Dg Esophagus  Result Date: 07/11/2017 CLINICAL DATA:  Persistent dysphagia, nausea/vomiting, unable to tolerate more than a tablespoon of food at a time, decreased appetite EXAM: ESOPHOGRAM/BARIUM SWALLOW TECHNIQUE: Single contrast examination was performed using  thin barium. FLUOROSCOPY TIME:  Fluoroscopy Time:  3.1 minutes Radiation Exposure Index (if provided by the fluoroscopic device): 1.9 mGy Number of Acquired Spot Images: 5 COMPARISON:  CT chest abdomen pelvis dated 07/08/2017 FINDINGS: Moderate esophageal dysmotility with fluid/debris in the mid/distal esophagus. Marked narrowing/stricture  of the distal esophagus just above the GE junction. After significant delay, trace contrast passed into the proximal stomach. When correlating with the recent CT, there is no evidence of esophageal mass. However, a fat containing hernia at the esophageal hiatus is present, possibly leading to extrinsic compression. IMPRESSION: Severe stricture of the distal esophagus just above the GE junction. Secondary esophageal dysmotility. When correlating with recent CT, there is no evidence of esophageal mass. Possible extrinsic compression secondary to a fat containing hernia. Consider endoscopy for further evaluation. Electronically Signed   By: Charline Bills M.D.   On: 07/11/2017 10:21   US Renal  Result Date: 07/09/2017 CLINICAL DATA:  Elevated creatinine  EXAM: RENAL / URINARY TRACT ULTRASOUND COMPLETE COMPARISON:  CT 07/08/2017 FINDINGS: Right Kidney: Length: 10.6 cm. Echogenicity within normal limits. No mass or hydronephrosis visualized. Left Kidney: Length: 10.3 cm. Multiple cysts in the left kidney, the largest 4.5 cm. These appear benign. No hydronephrosis. Bladder: Appears normal for degree of bladder distention. IMPRESSION: Left renal cysts. No acute findings.  No hydronephrosis. Electronically Signed   By: Charlett Nose M.D.   On: 07/09/2017 15:56   Dg Abdomen Peg Tube Location  Result Date: 07/19/2017 CLINICAL DATA:  Jejunostomy tube leaking. EXAM: ABDOMEN - 1 VIEW COMPARISON:  07/19/2016. FINDINGS: KUB following nonionic contrast administration into enterostomy tube obtained. Enterostomy tube tip noted within a right-sided abdominal small-bowel loop. No definite leakage noted. Contrast from prior study noted in the colon. IMPRESSION: Enterostomy tube tip noted in a small-bowel loop in the right abdomen. Electronically Signed   By: Maisie Fus  Register   On: 07/19/2017 16:41   Dg Chest Port 1 View  Result Date: 07/27/2017 CLINICAL DATA:  Atelectasis. EXAM: PORTABLE CHEST 1 VIEW COMPARISON:  Radiographs of July 26, 2016. FINDINGS: Stable cardiomediastinal silhouette. Stable right lower lobe opacity is noted concerning for pneumonia. No pneumothorax is noted. Mild left basilar subsegmental atelectasis is noted. Bony thorax is unremarkable. IMPRESSION: Stable right lower lobe pneumonia. Electronically Signed   By: Lupita Raider, M.D.   On: 07/27/2017 08:07   Dg Chest Port 1 View  Result Date: 07/26/2017 CLINICAL DATA:  Thoracentesis. EXAM: PORTABLE CHEST 1 VIEW COMPARISON:  One-view chest x-ray from the same day. FINDINGS: The heart size is normal. Interstitial and airspace disease at the right lung base is slightly improved. There is no pneumothorax. Mild left base airspace disease present. Emphysematous changes are noted. The NG tube was removed.  IMPRESSION: 1. Persistent right lower lobe interstitial and airspace disease concerning for pneumonia. 2. Partial clearing of the right upper lobe. 3. Mild atelectasis left base. 4. COPD. 5. Interval removal of NG tube. Electronically Signed   By: Marin Roberts M.D.   On: 07/26/2017 14:03   Dg Chest Port 1 View  Result Date: 07/26/2017 CLINICAL DATA:  72 year old male with obstructing esophageal cancer. Postoperative day 6 status post abdominal surgery for small bowel obstruction at a jejunostomy tube site. EXAM: PORTABLE CHEST 1 VIEW COMPARISON:  07/25/2017 and earlier. FINDINGS: Portable AP semi upright view at 0454 hours. Extubated. Enteric tube remains in place, but is looped in the distal esophagus level. Yesterday the tube appear to course into the stomach. Continued volume loss in the right lung with mild rightward shift of the mediastinum. Superimposed veiling right lung opacity suggesting pleural effusion. Large left lung volume. The left lung remains clear. Stable mediastinal contours. IMPRESSION: 1. Enteric tube now is looped in the distal esophagus, whereas yesterday tube appeared to course into  the stomach. 2. Extubated. 3. Suspect combined lower lobe collapse and pleural effusion in the right lung. Consider mucous plug. 4. The left lung remains clear. Electronically Signed   By: Odessa Fleming M.D.   On: 07/26/2017 07:41   Dg Chest Port 1 View  Result Date: 07/25/2017 CLINICAL DATA:  Respiratory failure EXAM: PORTABLE CHEST 1 VIEW COMPARISON:  Portable chest x-ray of July 24, 2017 FINDINGS: There is increased density in the right mid and lower lung as compared to yesterday's study. The left lung is clear. The heart and pulmonary vascularity are normal. The endotracheal tube tip lies approximately 3.8 cm above the carina. The esophagogastric tube tip in proximal port project below the GE junction. IMPRESSION: Increased density on the right consistent with worsening atelectasis or pneumonia  and likely a posterior layering pleural effusion. Underlying COPD. The support tubes are in reasonable position. Electronically Signed   By: David  [place] M.D.   On: 07/25/2017 06:55   Dg Chest Port 1 View  Result Date: 07/24/2017 CLINICAL DATA:  Follow-up ventilator support. EXAM: PORTABLE CHEST 1 VIEW COMPARISON:  07/23/2017 FINDINGS: Endotracheal tube tip is 4 cm above the carina. Nasogastric tube enters the abdomen. Background emphysema. Left lung is otherwise clear. Persistent pneumonia and volume loss in the right lower lung. I think there is slight radiographic improvement. IMPRESSION: Slight improvement in lower right lung pneumonia. Electronically Signed   By: Paulina Fusi M.D.   On: 07/24/2017 06:53   Dg Chest Port 1 View  Result Date: 07/23/2017 CLINICAL DATA:  Post intubation and enteric tube placement EXAM: PORTABLE CHEST 1 VIEW COMPARISON:  07/22/2017 FINDINGS: Endotracheal tube placed with tip measuring 3 cm above the carina. Enteric tube tip is coiled in the left upper quadrant consistent with location in the upper stomach. Volume loss and consolidation in the right lung with hyperinflation of the left lung. Probable right pleural effusion. Right lung changes are progressing since the previous study. No pneumothorax. IMPRESSION: Appliances appear in satisfactory position. Progression of consolidation, effusion, and volume loss in the right lung since previous study. Compensatory hyperinflation of the left lung. Electronically Signed   By: Burman Nieves M.D.   On: 07/23/2017 03:51   Dg Chest Port 1 View  Result Date: 07/22/2017 CLINICAL DATA:  Status postextubation.  Airspace consolidation. EXAM: PORTABLE CHEST 1 VIEW COMPARISON:  July 21, 2017 FINDINGS: Endotracheal tube and nasogastric tube have been removed. No pneumothorax. There is airspace consolidation throughout the right mid lower lung zones, increased from 1 day prior. There is a small right pleural effusion. The left lung  is somewhat hyperexpanded. There is no edema or consolidation on the left. Heart size and pulmonary vascular normal. No adenopathy appreciable. Old healed fractures noted on the left. IMPRESSION: No pneumothorax. Increase in airspace consolidation in the right mid and lower lung zones with small right pleural effusion. Question progression of pneumonia versus aspiration. Both entities may be present concurrently. Left lung hyperexpanded but clear. Stable cardiac silhouette. Electronically Signed   By: Bretta Bang III M.D.   On: 07/22/2017 07:32   Portable Chest Xray  Result Date: 07/21/2017 CLINICAL DATA:  Endotracheal tube positioning EXAM: PORTABLE CHEST 1 VIEW COMPARISON:  Yesterday FINDINGS: Endotracheal tube tip between the clavicular heads and carina. An orogastric tube reaches the stomach. Lung opacity at the right more than left base. Small pleural effusions by abdominal CT yesterday. Large lung volumes compatible with COPD. IMPRESSION: 1. Stable positioning of endotracheal and orogastric tubes. 2. Small pleural  effusions and right more than left lower lobe opacity. Clinical circumstances primarily concerning for aspiration pneumonitis/pneumonia. Electronically Signed   By: Marnee Spring M.D.   On: 07/21/2017 07:07   Portable Chest X-ray  Result Date: 07/20/2017 CLINICAL DATA:  72 year old male with a history of endotracheal tube placement EXAM: PORTABLE CHEST 1 VIEW COMPARISON:  07/20/2017 abdominal CT 07/20/2017 FINDINGS: Cardiomediastinal silhouette unchanged in size and contour. Interval placement of endotracheal tube, terminating suitably above the carina approximately 4 cm. Interval placement of gastric tube, terminating out of the field of view. Similar appearance of bibasilar opacities, more pronounced on the right. No pneumothorax IMPRESSION: Interval placement of endotracheal tube, terminating suitably above the carina. Interval placement of gastric tube, terminating out of the  field of view. Similar appearance of right greater than left airspace disease Electronically Signed   By: Gilmer Mor D.O.   On: 07/20/2017 20:01   Dg Chest Port 1 View  Result Date: 07/19/2017 CLINICAL DATA:  Shortness of breath. EXAM: PORTABLE CHEST 1 VIEW COMPARISON:  CT 07/08/2016.  Chest x-ray 01/13/2016. FINDINGS: Mediastinum hilar structures normal. Cardiomegaly with normal pulmonary vascularity. COPD. Right base infiltrate with small right pleural effusion. IMPRESSION: 1. Right base infiltrate consistent with pneumonia. Small right pleural effusion. Similar findings noted on prior CT of 07/08/2016. 2.  COPD. Electronically Signed   By: Maisie Fus  Register   On: 07/19/2017 13:51   Dg Abd Acute W/chest  Result Date: 07/08/2017 CLINICAL DATA:  Vomiting for 10 days.  Weight loss. EXAM: DG ABDOMEN ACUTE W/ 1V CHEST COMPARISON:  Chest radiograph 01/13/2016 FINDINGS: The lungs are hyperexpanded with multiple areas of increased lucency. No focal airspace consolidation or pulmonary edema. No pneumothorax or sizable pleural effusion. No free intraperitoneal air. No dilated loops of bowel are visible. The round densities overlying the left psoas muscle may be within the transverse colon. IMPRESSION: 1. COPD without acute airspace disease. 2. No free intraperitoneal air or evidence of small-bowel obstruction. Electronically Signed   By: Deatra Robinson M.D.   On: 07/08/2017 00:07   Dg Abd Portable 1v  Result Date: 07/27/2017 CLINICAL DATA:  Gastroesophageal ca, f/u feeding tube placement, some fullness feeling in stomach EXAM: PORTABLE ABDOMEN - 1 VIEW COMPARISON:  Radiograph 129 19 FINDINGS: Enteric tube projects over the RIGHT lower quadrant. No dilated large or small bowel. Small amount of high-density stool remains in rectum IMPRESSION: No bowel obstruction Electronically Signed   By: Genevive Bi M.D.   On: 07/27/2017 10:13   Dg Abd Portable 1v  Result Date: 07/19/2017 CLINICAL DATA:  COPD, nausea  vomiting EXAM: PORTABLE ABDOMEN - 1 VIEW COMPARISON:  None. FINDINGS: No dilated large or small bowel. Jejunostomy tube with tip in the RIGHT lower quadrant. Midline skin staples noted. No evidence bowel obstruction. Oral contrast from prior CT within the colon IMPRESSION: 1. No evidence of bowel obstruction. 2. Jejunostomy tube in place. Electronically Signed   By: Genevive Bi M.D.   On: 07/19/2017 10:34   Dg Kayleen Memos W/o Kub  Result Date: 07/26/2017 CLINICAL DATA:  72 year old male with esophageal cancer. Subsequent encounter. EXAM: UPPER GI SERIES WITHOUT KUB TECHNIQUE: Routine upper GI series was performed with thin barium. FLUOROSCOPY TIME:  Fluoroscopy Time:  1 minute and 48 seconds Radiation Exposure Index: 5.6 mGy COMPARISON:  07/26/2017 chest x-ray.  07/20/2017 CT. FINDINGS: George Stokes's recent nasogastric tube was removed by nursing staff prior to coming to radiology. Request for placing nasogastric tube and performing upper GI series and small-bowel follow-through. Procedure  discussed with George Stokes. Lidocaine gel utilized. Nasogastric tube gently advanced. Not able to advance into the stomach. Barium instilled. Barium did not traverses into the stomach. Barium was aspirated and nasogastric tube removed. IMPRESSION: Unsuccessful placement of nasogastric tube. Not able to performed upper GI series and small-bowel follow-through as noted above. Results discussed with George Stokes's nurse with request to call covering physician with results. Electronically Signed   By: Lacy Duverney M.D.   On: 07/26/2017 11:48     CBC Recent Labs  Lab 07/26/17 0119 07/27/17 0307 07/28/17 0318 07/30/17 0244  WBC 17.4* 14.0* 13.5* 12.5*  HGB 12.8* 11.7* 11.3* 10.8*  HCT 39.3 34.5* 34.6* 32.8*  PLT 305 299 236 297  MCV 89.9 88.9 89.2 91.4  MCH 29.3 30.2 29.1 30.1  MCHC 32.6 33.9 32.7 32.9  RDW 16.3* 16.2* 16.3* 16.2*    Chemistries  Recent Labs  Lab 07/26/17 0119 07/27/17 0307 07/28/17 0318 07/30/17 0244    NA 138 140 140 140  K 4.2 3.8 3.8 4.3  CL 103 105 103 96*  CO2 28 32 33* 37*  GLUCOSE 87 166* 107* 148*  BUN 10 14 13 15   CREATININE 0.61 0.78 0.71 0.61  CALCIUM 8.3* 8.0* 7.7* 7.9*  MG 1.6* 2.4  --   --   AST 16 36 41  --   ALT 15* 22 33  --   ALKPHOS 77 94 99  --   BILITOT 0.8 0.5 0.3  --    ------------------------------------------------------------------------------------------------------------------ No results for input(s): CHOL, HDL, LDLCALC, TRIG, CHOLHDL, LDLDIRECT in the last 72 hours.  No results found for: HGBA1C ------------------------------------------------------------------------------------------------------------------ No results for input(s): TSH, T4TOTAL, T3FREE, THYROIDAB in the last 72 hours.  Invalid input(s): FREET3 ------------------------------------------------------------------------------------------------------------------ No results for input(s): VITAMINB12, FOLATE, FERRITIN, TIBC, IRON, RETICCTPCT in the last 72 hours.  Coagulation profile No results for input(s): INR, PROTIME in the last 168 hours.  No results for input(s): DDIMER in the last 72 hours.  Cardiac Enzymes Recent Labs  Lab 07/25/17 1916 07/26/17 0119  TROPONINI <0.03 <0.03   ------------------------------------------------------------------------------------------------------------------ No results found for: BNP   Sherree Shankman M.D on 08/01/2017 at 5:15 PM  Between 7am to 7pm - Pager - [phone]  After 7pm go to [email or web address] - password [email or web address]  [facility] -  Office  [phone]  Voice Recognition Reubin Milan dictation system was used to create this note, attempts have been made to correct errors. Please contact the author with questions and/or clarifications.

## 2017-08-01 NOTE — Progress Notes (Deleted)
Physical Therapy Treatment Patient Details Name: George Stokes MRN: [id number] DOB: 1946/02/28 Today's Date: 08/01/2017    History of Present Illness 72 y.o. male with medical history significant of COPD, CRF on 4 liters he thinks at home, htn comes in with over a week of nausea and vomiting.  EGD performed 07/13/17 found malignant appearing esophageal stenosis - biopsy sent.   Dx of PNA    PT Comments    Patient remains unsafe for transfers and ambulation due to impulsivity (wanting to sit prior to being close enough to surface,) quickly fatigues and poor safety awareness.  Continue to recommend SNF level rehab at d/c.  PT to follow acutely if not d/c.    Follow Up Recommendations  SNF;Supervision/Assistance - 24 hour     Equipment Recommendations  Rolling walker with 5" wheels;3in1 (PT)    Recommendations for Other Services       Precautions / Restrictions Precautions Precautions: Fall Precaution Comments: NPO feeding tube    Mobility  Bed Mobility Overal bed mobility: Needs Assistance Bed Mobility: Rolling;Sidelying to Sit Rolling: Supervision Sidelying to sit: Min assist       General bed mobility comments: cues to use railings and for technique, assist for trunk  Transfers Overall transfer level: Needs assistance Equipment used: Rolling walker (2 wheeled) Transfers: Sit to/from Stand Sit to Stand: Min assist;Mod assist         General transfer comment: cues for hand placement to stand, mod to max cues for backing all the way up to the seat due to pt attempting to sit while still far away reaching for arm of chair,  Wife attempting to communicate in pt's native tounge also; demonstrated proper technique after pt sitting  Ambulation/Gait Ambulation/Gait assistance: Mod assist Ambulation Distance (Feet): 125 Feet Assistive device: Rolling walker (2 wheeled) Gait Pattern/deviations: Step-to pattern;Step-through pattern;Decreased stride length;Drifts  right/left;Shuffle;Trunk flexed     General Gait Details: veers to R, instability with turns lifting walker, mod A at times for balance and walker safety   Stairs            Wheelchair Mobility    Modified Rankin (Stroke Patients Only)       Balance Overall balance assessment: Needs assistance   Sitting balance-Leahy Scale: Fair     Standing balance support: Bilateral upper extremity supported Standing balance-Leahy Scale: Poor Standing balance comment: UE support for balance                            Cognition Arousal/Alertness: Awake/alert Behavior During Therapy: Flat affect Overall Cognitive Status: Within Functional Limits for tasks assessed                                 General Comments: impulsive     VC's for safety      Exercises      General Comments General comments (skin integrity, edema, etc.): Wife in room and reports pt going to rehab this pm.       Pertinent Vitals/Pain Pain Assessment: No/denies pain    Home Living                      Prior Function            PT Goals (current goals can now be found in the care plan section) Progress towards PT goals: Not progressing toward goals - comment  Frequency    Min 3X/week      PT Plan Current plan remains appropriate    Co-evaluation              AM-PAC PT "6 Clicks" Daily Activity  Outcome Measure  Difficulty turning over in bed (including adjusting bedclothes, sheets and blankets)?: A Little Difficulty moving from lying on back to sitting on the side of the bed? : Unable Difficulty sitting down on and standing up from a chair with arms (e.g., wheelchair, bedside commode, etc,.)?: Unable Help needed moving to and from a bed to chair (including a wheelchair)?: A Little Help needed walking in hospital room?: A Lot Help needed climbing 3-5 steps with a railing? : Total 6 Click Score: 11    End of Session Equipment Utilized During  Treatment: Gait belt Activity Tolerance: Patient tolerated treatment well Patient left: in chair;with call bell/phone within reach;with family/visitor present   PT Visit Diagnosis: Muscle weakness (generalized) (M62.81);Difficulty in walking, not elsewhere classified (R26.2)     Time: 9604-5409 PT Time Calculation (min) (ACUTE ONLY): 17 min  Charges:  $Gait Training: 8-22 mins                    G CodesSheran Lawless, [place] [phone] 08/01/2017\   Elray Mcgregor 08/01/2017, 12:38 PM

## 2017-08-01 NOTE — Progress Notes (Signed)
CSW following to assist with disposition. Pursuing SNF at [place] for rehab. Spoke with pt's brother/POA who advises [facility] is their desired facility.  Barriers are that pt needs daily radiation at [facility] and weekly chemotherapy which complicates pt's insurance coverage for rehab as well as limits time he can participate in therapy sessions. Discussed this with brother and will work with facility in order to devise feasible plan for pt to receive rehab at [place].  Will follow.   Sharren Bridge, MSW, LCSW Clinical Social Work 08/01/2017 [phone]

## 2017-08-01 NOTE — Telephone Encounter (Signed)
Scheduled appt per 2/4 sch message - per George Stokes patient to get an updated scheduled on discharge papers.

## 2017-08-01 NOTE — Progress Notes (Signed)
PT demonstrated hands on understanding of Flutter device. 

## 2017-08-01 NOTE — Progress Notes (Signed)
[facility] Progress Note  12 Days Post-Op  Subjective: CC: dry mouth Patient complaining that mouth is dry and he would like something to moisten it. Denies abdominal pain. Having bowel function. Tolerating TF.  Objective: Vital signs in last 24 hours: Temp:  [97.8 F (36.6 C)-98.2 F (36.8 C)] 98.2 F (36.8 C) (02/04 0407) Pulse Rate:  [74-84] 74 (02/04 0407) Resp:  [12-16] 12 (02/04 0407) BP: (94-128)/(46-73) 115/73 (02/04 0407) SpO2:  [88 %-98 %] 98 % (02/04 0407) Weight:  [63.3 kg (139 lb 8.8 oz)] 63.3 kg (139 lb 8.8 oz) (02/04 0407) Last BM Date: 07/29/17  Intake/Output from previous day: 02/03 0701 - 02/04 0700 In: -  Out: 4825 [Urine:1375] Intake/Output this shift: No intake/output data recorded.  PE: Gen:  Alert, NAD, pleasant Card:  Regular rate and rhythm, pedal pulses 2+ BL Pulm:  Nasal cannula present, CTAB Abd: Soft, non-tender, non-distended, bowel sounds present, no HSM, incision C/D/I, J-tube present Skin: warm and dry, no rashes  Psych: A&Ox3   Lab Results:  Recent Labs    07/30/17 0244  WBC 12.5*  HGB 10.8*  HCT 32.8*  PLT 297   BMET Recent Labs    07/30/17 0244  NA 140  K 4.3  CL 96*  CO2 37*  GLUCOSE 148*  BUN 15  CREATININE 0.61  CALCIUM 7.9*   PT/INR No results for input(s): LABPROT, INR in the last 72 hours. CMP     Component Value Date/Time   NA 140 07/30/2017 0244   NA 143 06/29/2017 1508   K 4.3 07/30/2017 0244   CL 96 (L) 07/30/2017 0244   CO2 37 (H) 07/30/2017 0244   GLUCOSE 148 (H) 07/30/2017 0244   BUN 15 07/30/2017 0244   BUN 28 (H) 06/29/2017 1508   CREATININE 0.61 07/30/2017 0244   CREATININE 1.07 05/01/2015 1508   CALCIUM 7.9 (L) 07/30/2017 0244   PROT 4.6 (L) 07/28/2017 0318   PROT 7.3 06/29/2017 1508   ALBUMIN 1.7 (L) 07/28/2017 0318   ALBUMIN 4.5 06/29/2017 1508   AST 41 07/28/2017 0318   ALT 33 07/28/2017 0318   ALKPHOS 99 07/28/2017 0318   BILITOT 0.3 07/28/2017 0318   BILITOT 0.6  06/29/2017 1508   GFRNONAA >60 07/30/2017 0244   GFRNONAA 71 05/01/2015 1508   GFRAA >60 07/30/2017 0244   GFRAA 83 05/01/2015 1508   Lipase     Component Value Date/Time   LIPASE 39 07/12/2017 0342       Studies/Results: No results found.  Anti-infectives: Anti-infectives (From admission, onward)   Start     Dose/Rate Route Frequency Ordered Stop   07/29/17 0800  fluconazole (DIFLUCAN) IVPB 100 mg  Status:  Discontinued     100 mg 50 mL/hr over 60 Minutes Intravenous Every 24 hours 07/28/17 0840 07/28/17 0841   07/29/17 0800  fluconazole (DIFLUCAN) IVPB 100 mg     100 mg 50 mL/hr over 60 Minutes Intravenous Every 24 hours 07/28/17 0841 08/05/17 0759   07/28/17 0845  fluconazole (DIFLUCAN) IVPB 200 mg     200 mg 100 mL/hr over 60 Minutes Intravenous  Once 07/28/17 0840 07/28/17 1030   07/27/17 1000  fluconazole (DIFLUCAN) 40 MG/ML suspension 200 mg  Status:  Discontinued     200 mg Per Tube Daily 07/26/17 0925 07/26/17 1100   07/26/17 1000  fluconazole (DIFLUCAN) 40 MG/ML suspension 400 mg  Status:  Discontinued     400 mg Per Tube  Once 07/26/17 0924 07/26/17 1100  07/20/17 1400  ertapenem (INVANZ) 1 g in sodium chloride 0.9 % 50 mL IVPB  Status:  Discontinued     1 g 100 mL/hr over 30 Minutes Intravenous Every 24 hours 07/20/17 1209 07/29/17 0940   07/16/17 0000  ceFAZolin (ANCEF) IVPB 2g/100 mL premix     2 g 200 mL/hr over 30 Minutes Intravenous Every 8 hours 07/15/17 1833 07/16/17 0054   07/15/17 0945  amoxicillin-clavulanate (AUGMENTIN) 250-62.5 MG/5ML suspension 500 mg     500 mg Oral Every 8 hours 07/15/17 0938 07/19/17 0928   07/15/17 0600  ceFAZolin (ANCEF) IVPB 2g/100 mL premix     2 g 200 mL/hr over 30 Minutes Intravenous On call to O.R. 07/14/17 1809 07/15/17 1632   07/11/17 1600  ampicillin-sulbactam (UNASYN) 1.5 g in sodium chloride 0.9 % 50 mL IVPB  Status:  Discontinued     1.5 g 100 mL/hr over 30 Minutes Intravenous Every 6 hours 07/11/17 1329  07/15/17 0940   07/08/17 1000  cefTRIAXone (ROCEPHIN) 1 g in dextrose 5 % 50 mL IVPB  Status:  Discontinued     1 g 100 mL/hr over 30 Minutes Intravenous Every 24 hours 07/08/17 0954 07/11/17 1315   07/08/17 1000  azithromycin (ZITHROMAX) 500 mg in dextrose 5 % 250 mL IVPB  Status:  Discontinued     500 mg 250 mL/hr over 60 Minutes Intravenous Every 24 hours 07/08/17 0954 07/11/17 1315       Assessment/Plan COPD oxygen dependent/ intubated 900 ml thoracentesis 07/26/17 - CCM Alcohol abuse Malnutrition  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 Dalbert Batman 1/23 CT showed kink in small bowel at the level of jejunostomy causing SBO S/p ex-lap with revision of jejunostomy 1/23 Dr. Excell Seltzer -continue tube feeds -planning XRT/chemo soon - staples removed from midline incision and steri-strips applied  ID -ertapenem 1/23> 2/1; Diflucan 1/29 >>day 7 FEN -NPO, IVF/Tube feeding at goal 60 ml/hr. VTE -SCDs, SQ heparin Foley: in Follow up -Dr. Dalbert Batman   Plan:  Pt is up to goal on tube feeds. They are planning chemotherapy and radiation therapy. Staples removed. Be sure they do not put any crushed medicines into the Jejunostomy tube.  Nothing but liquids and tube feeds.    LOS: 24 days    Brigid Re , [facility] 08/01/2017, 8:08 AM Pager: [phone] Consults: [phone] Mon-Fri 7:00 am-4:30 pm Sat-Sun 7:00 am-11:30 am

## 2017-08-02 ENCOUNTER — Ambulatory Visit
Admit: 2017-08-02 | Discharge: 2017-08-02 | Disposition: A | Discharge status: Home / Self Care | Payer: Medicare Other | Attending: Radiation Oncology | Admitting: Radiation Oncology

## 2017-08-02 DIAGNOSIS — J168 Pneumonia due to other specified infectious organisms: Secondary | ICD-10-CM | POA: Diagnosis not present

## 2017-08-02 DIAGNOSIS — Z934 Other artificial openings of gastrointestinal tract status: Secondary | ICD-10-CM | POA: Diagnosis not present

## 2017-08-02 DIAGNOSIS — R531 Weakness: Secondary | ICD-10-CM | POA: Diagnosis not present

## 2017-08-02 DIAGNOSIS — Z87891 Personal history of nicotine dependence: Secondary | ICD-10-CM | POA: Diagnosis not present

## 2017-08-02 DIAGNOSIS — N179 Acute kidney failure, unspecified: Secondary | ICD-10-CM | POA: Diagnosis not present

## 2017-08-02 DIAGNOSIS — J189 Pneumonia, unspecified organism: Secondary | ICD-10-CM | POA: Diagnosis not present

## 2017-08-02 DIAGNOSIS — Z5111 Encounter for antineoplastic chemotherapy: Secondary | ICD-10-CM | POA: Diagnosis present

## 2017-08-02 DIAGNOSIS — D630 Anemia in neoplastic disease: Secondary | ICD-10-CM | POA: Diagnosis present

## 2017-08-02 DIAGNOSIS — E860 Dehydration: Secondary | ICD-10-CM | POA: Diagnosis not present

## 2017-08-02 DIAGNOSIS — E871 Hypo-osmolality and hyponatremia: Secondary | ICD-10-CM | POA: Diagnosis present

## 2017-08-02 DIAGNOSIS — M6281 Muscle weakness (generalized): Secondary | ICD-10-CM | POA: Diagnosis not present

## 2017-08-02 DIAGNOSIS — C801 Malignant (primary) neoplasm, unspecified: Secondary | ICD-10-CM | POA: Diagnosis not present

## 2017-08-02 DIAGNOSIS — Z9221 Personal history of antineoplastic chemotherapy: Secondary | ICD-10-CM | POA: Diagnosis not present

## 2017-08-02 DIAGNOSIS — I10 Essential (primary) hypertension: Secondary | ICD-10-CM | POA: Diagnosis not present

## 2017-08-02 DIAGNOSIS — Z7951 Long term (current) use of inhaled steroids: Secondary | ICD-10-CM | POA: Diagnosis not present

## 2017-08-02 DIAGNOSIS — R627 Adult failure to thrive: Secondary | ICD-10-CM | POA: Diagnosis present

## 2017-08-02 DIAGNOSIS — C159 Malignant neoplasm of esophagus, unspecified: Secondary | ICD-10-CM | POA: Diagnosis not present

## 2017-08-02 DIAGNOSIS — K9419 Other complications of enterostomy: Secondary | ICD-10-CM | POA: Diagnosis not present

## 2017-08-02 DIAGNOSIS — R634 Abnormal weight loss: Secondary | ICD-10-CM | POA: Diagnosis not present

## 2017-08-02 DIAGNOSIS — E441 Mild protein-calorie malnutrition: Secondary | ICD-10-CM | POA: Diagnosis not present

## 2017-08-02 DIAGNOSIS — D001 Carcinoma in situ of esophagus: Secondary | ICD-10-CM | POA: Diagnosis not present

## 2017-08-02 DIAGNOSIS — M109 Gout, unspecified: Secondary | ICD-10-CM | POA: Diagnosis not present

## 2017-08-02 DIAGNOSIS — R1312 Dysphagia, oropharyngeal phase: Secondary | ICD-10-CM | POA: Diagnosis present

## 2017-08-02 DIAGNOSIS — E538 Deficiency of other specified B group vitamins: Secondary | ICD-10-CM | POA: Diagnosis present

## 2017-08-02 DIAGNOSIS — D649 Anemia, unspecified: Secondary | ICD-10-CM | POA: Diagnosis not present

## 2017-08-02 DIAGNOSIS — Z809 Family history of malignant neoplasm, unspecified: Secondary | ICD-10-CM | POA: Diagnosis not present

## 2017-08-02 DIAGNOSIS — E46 Unspecified protein-calorie malnutrition: Secondary | ICD-10-CM | POA: Diagnosis not present

## 2017-08-02 DIAGNOSIS — Z7189 Other specified counseling: Secondary | ICD-10-CM | POA: Diagnosis not present

## 2017-08-02 DIAGNOSIS — F1010 Alcohol abuse, uncomplicated: Secondary | ICD-10-CM | POA: Diagnosis not present

## 2017-08-02 DIAGNOSIS — Z510 Encounter for antineoplastic radiation therapy: Secondary | ICD-10-CM | POA: Diagnosis not present

## 2017-08-02 DIAGNOSIS — C160 Malignant neoplasm of cardia: Secondary | ICD-10-CM | POA: Diagnosis not present

## 2017-08-02 DIAGNOSIS — L89150 Pressure ulcer of sacral region, unstageable: Secondary | ICD-10-CM | POA: Diagnosis not present

## 2017-08-02 DIAGNOSIS — R0902 Hypoxemia: Secondary | ICD-10-CM | POA: Diagnosis not present

## 2017-08-02 DIAGNOSIS — J9611 Chronic respiratory failure with hypoxia: Secondary | ICD-10-CM | POA: Diagnosis not present

## 2017-08-02 DIAGNOSIS — M1A09X0 Idiopathic chronic gout, multiple sites, without tophus (tophi): Secondary | ICD-10-CM | POA: Diagnosis not present

## 2017-08-02 DIAGNOSIS — R2689 Other abnormalities of gait and mobility: Secondary | ICD-10-CM | POA: Diagnosis not present

## 2017-08-02 DIAGNOSIS — R1310 Dysphagia, unspecified: Secondary | ICD-10-CM | POA: Diagnosis not present

## 2017-08-02 DIAGNOSIS — T849XXA Unspecified complication of internal orthopedic prosthetic device, implant and graft, initial encounter: Secondary | ICD-10-CM | POA: Diagnosis not present

## 2017-08-02 DIAGNOSIS — R1319 Other dysphagia: Secondary | ICD-10-CM | POA: Diagnosis not present

## 2017-08-02 DIAGNOSIS — K5909 Other constipation: Secondary | ICD-10-CM | POA: Diagnosis present

## 2017-08-02 DIAGNOSIS — K9413 Enterostomy malfunction: Secondary | ICD-10-CM | POA: Diagnosis present

## 2017-08-02 DIAGNOSIS — C155 Malignant neoplasm of lower third of esophagus: Secondary | ICD-10-CM | POA: Diagnosis not present

## 2017-08-02 DIAGNOSIS — Z9981 Dependence on supplemental oxygen: Secondary | ICD-10-CM | POA: Diagnosis not present

## 2017-08-02 DIAGNOSIS — K222 Esophageal obstruction: Secondary | ICD-10-CM | POA: Diagnosis not present

## 2017-08-02 DIAGNOSIS — J449 Chronic obstructive pulmonary disease, unspecified: Secondary | ICD-10-CM | POA: Diagnosis not present

## 2017-08-02 DIAGNOSIS — F1020 Alcohol dependence, uncomplicated: Secondary | ICD-10-CM | POA: Diagnosis not present

## 2017-08-02 DIAGNOSIS — R4182 Altered mental status, unspecified: Secondary | ICD-10-CM | POA: Diagnosis not present

## 2017-08-02 DIAGNOSIS — Z8701 Personal history of pneumonia (recurrent): Secondary | ICD-10-CM | POA: Diagnosis not present

## 2017-08-02 DIAGNOSIS — Z515 Encounter for palliative care: Secondary | ICD-10-CM | POA: Diagnosis not present

## 2017-08-02 DIAGNOSIS — Z79899 Other long term (current) drug therapy: Secondary | ICD-10-CM | POA: Diagnosis not present

## 2017-08-02 DIAGNOSIS — T85528A Displacement of other gastrointestinal prosthetic devices, implants and grafts, initial encounter: Secondary | ICD-10-CM | POA: Diagnosis not present

## 2017-08-02 DIAGNOSIS — Z681 Body mass index (BMI) 19 or less, adult: Secondary | ICD-10-CM | POA: Diagnosis not present

## 2017-08-02 DIAGNOSIS — E43 Unspecified severe protein-calorie malnutrition: Secondary | ICD-10-CM | POA: Diagnosis present

## 2017-08-02 DIAGNOSIS — K9423 Gastrostomy malfunction: Secondary | ICD-10-CM | POA: Diagnosis not present

## 2017-08-02 DIAGNOSIS — R112 Nausea with vomiting, unspecified: Secondary | ICD-10-CM | POA: Diagnosis present

## 2017-08-02 DIAGNOSIS — Z434 Encounter for attention to other artificial openings of digestive tract: Secondary | ICD-10-CM | POA: Diagnosis not present

## 2017-08-02 DIAGNOSIS — T859XXA Unspecified complication of internal prosthetic device, implant and graft, initial encounter: Secondary | ICD-10-CM | POA: Diagnosis not present

## 2017-08-02 DIAGNOSIS — Z923 Personal history of irradiation: Secondary | ICD-10-CM | POA: Diagnosis not present

## 2017-08-02 DIAGNOSIS — R278 Other lack of coordination: Secondary | ICD-10-CM | POA: Diagnosis not present

## 2017-08-02 LAB — CBC WITH DIFFERENTIAL/PLATELET
BASOS ABS: 0 10*3/uL (ref 0.0–0.1)
Basophils Relative: 0 %
EOS ABS: 0 10*3/uL (ref 0.0–0.7)
EOS PCT: 0 %
HCT: 31.8 % — ABNORMAL LOW (ref 39.0–52.0)
Hemoglobin: 10.2 g/dL — ABNORMAL LOW (ref 13.0–17.0)
Lymphocytes Relative: 3 %
Lymphs Abs: 0.2 10*3/uL — ABNORMAL LOW (ref 0.7–4.0)
MCH: 29.4 pg (ref 26.0–34.0)
MCHC: 32.1 g/dL (ref 30.0–36.0)
MCV: 91.6 fL (ref 78.0–100.0)
Monocytes Absolute: 0.1 10*3/uL (ref 0.1–1.0)
Monocytes Relative: 1 %
Neutro Abs: 8.3 10*3/uL — ABNORMAL HIGH (ref 1.7–7.7)
Neutrophils Relative %: 96 %
PLATELETS: 290 10*3/uL (ref 150–400)
RBC: 3.47 MIL/uL — AB (ref 4.22–5.81)
RDW: 16 % — ABNORMAL HIGH (ref 11.5–15.5)
WBC: 8.6 10*3/uL (ref 4.0–10.5)

## 2017-08-02 LAB — COMPREHENSIVE METABOLIC PANEL
ALT: 46 U/L (ref 17–63)
AST: 32 U/L (ref 15–41)
Albumin: 2 g/dL — ABNORMAL LOW (ref 3.5–5.0)
Alkaline Phosphatase: 88 U/L (ref 38–126)
Anion gap: 6 (ref 5–15)
BILIRUBIN TOTAL: 0.3 mg/dL (ref 0.3–1.2)
BUN: 20 mg/dL (ref 6–20)
CO2: 43 mmol/L — ABNORMAL HIGH (ref 22–32)
CREATININE: 0.68 mg/dL (ref 0.61–1.24)
Calcium: 8.2 mg/dL — ABNORMAL LOW (ref 8.9–10.3)
Chloride: 92 mmol/L — ABNORMAL LOW (ref 101–111)
Glucose, Bld: 168 mg/dL — ABNORMAL HIGH (ref 65–99)
POTASSIUM: 4.6 mmol/L (ref 3.5–5.1)
Sodium: 141 mmol/L (ref 135–145)
TOTAL PROTEIN: 4.9 g/dL — AB (ref 6.5–8.1)

## 2017-08-02 MED ORDER — HEPARIN SOD (PORK) LOCK FLUSH 100 UNIT/ML IV SOLN: 250.0000 [IU] | Freq: Once | INTRAVENOUS | 0 refills | Status: AC | PRN

## 2017-08-02 MED ORDER — ADULT MULTIVITAMIN LIQUID CH: 15.0000 mL | Freq: Every day | ORAL | 1 refills | Status: DC

## 2017-08-02 MED ORDER — FREE WATER: 50.0000 mL | 0 refills | Status: DC

## 2017-08-02 MED ORDER — MORPHINE SULFATE 4 MG/ML IJ SOLN
2.0000 mg | Freq: Once | INTRAMUSCULAR | Status: AC
  Administered 2017-08-02: 2 mg via INTRAVENOUS
  Filled 2017-08-02: qty 1

## 2017-08-02 MED ORDER — OSMOLITE 1.5 CAL PO LIQD: ORAL | 0 refills | Status: AC

## 2017-08-02 MED ORDER — RANITIDINE HCL 150 MG/10ML PO SYRP: 150.0000 mg | ORAL_SOLUTION | Freq: Two times a day (BID) | ORAL | 1 refills | Status: DC

## 2017-08-02 MED ORDER — MORPHINE SULFATE (PF) 4 MG/ML IV SOLN
4.0000 mg | Freq: Once | INTRAVENOUS | Status: AC
  Administered 2017-08-02: 4 mg via INTRAVENOUS
  Filled 2017-08-02: qty 1

## 2017-08-02 MED ORDER — HYDROCODONE-ACETAMINOPHEN 5-325 MG PO TABS: 1.0000 | ORAL_TABLET | Freq: Four times a day (QID) | ORAL | 0 refills | Status: DC | PRN

## 2017-08-02 MED ORDER — METOPROLOL TARTRATE 25 MG/10 ML ORAL SUSPENSION: 25.0000 mg | Freq: Two times a day (BID) | ORAL | 1 refills | Status: DC

## 2017-08-02 MED ORDER — ALBUTEROL SULFATE HFA 108 (90 BASE) MCG/ACT IN AERS: 2.0000 | INHALATION_SPRAY | Freq: Four times a day (QID) | RESPIRATORY_TRACT | 2 refills | Status: AC | PRN

## 2017-08-02 MED ORDER — OXYCODONE HCL 10 MG PO TABS: 10.0000 mg | ORAL_TABLET | ORAL | 0 refills | Status: DC | PRN

## 2017-08-02 MED ORDER — FLUCONAZOLE 10 MG/ML PO SUSR: 100.0000 mg | Freq: Every day | ORAL | 0 refills | Status: AC

## 2017-08-02 MED ORDER — IPRATROPIUM-ALBUTEROL 0.5-2.5 (3) MG/3ML IN SOLN: 3.0000 mL | Freq: Three times a day (TID) | RESPIRATORY_TRACT | 0 refills | Status: DC

## 2017-08-02 NOTE — Progress Notes (Signed)
PA Shona Simpson verbally ordered to give pt 2mg  of Morphine IV for pain. Per Bryson Ha if pt systolic pressure is below 100 then do not administer pain medication. Pt blood pressure was 109/64. Pt oriented to verify name and date of birth. 2mg  of Morphine was administered IV. Mardene Sayer LPN called [place] for verify last dose of pain medication give. Per Maggie RN on [place] pt did not have pain medication last night or today.   Blood pressure: 109/64 Temp. 98.2 Pulse 88

## 2017-08-02 NOTE — Clinical Social Work Placement (Signed)
Pt discharged with plan to admit to [facility] SNF - report [phone].  ([facility] has agreed to providing transport to daily appointments for radiation at [facility] and has advised weekly outpatient chemotherapy treatments are not barrier to admitting to SNF at this time)  Will arrange [facility] transportation   Pt's brother-in-law Francee Piccolo at bedside- updated pt's brother [name] via phone.     CLINICAL SOCIAL WORK PLACEMENT  NOTE  Date:  08/02/2017  Patient Details  Name: George Stokes MRN: [id number] Date of Birth: 01-Jun-1946  Clinical Social Work is seeking post-discharge placement for this patient at the [place] level of care (*CSW will initial, date and re-position this form in  chart as items are completed):  Yes   Patient/family provided with [facility] Work Department's list of facilities offering this level of care within the geographic area requested by the patient (or if unable, by the patient's family).  Yes   Patient/family informed of their freedom to choose among providers that offer the needed level of care, that participate in Medicare, Medicaid or managed care program needed by the patient, have an available bed and are willing to accept the patient.      Patient/family informed of [facility]'s ownership interest in [facility] and [facility], as well as of the fact that they are under no obligation to receive care at these facilities.  PASRR submitted to EDS on 07/28/17     PASRR number received on 07/28/17     Existing PASRR number confirmed on       FL2 transmitted to all facilities in geographic area requested by pt/family on 07/28/17     FL2 transmitted to all facilities within larger geographic area on       Patient informed that his/her managed care company has contracts with or will negotiate with certain facilities, including the following:  [facility]     Yes   Patient/family informed of bed  offers received.  Patient [facility] bed [facility]     Physician recommends and patient chooses bed at [facility]    Patient to be transferred to [facility] on 08/02/17.  Patient to be transferred to facility by [facility]     Patient family notified on 08/02/17 of transfer.  Name of family member notified:  brother in law Francee Piccolo, brother [name]     PHYSICIAN       Additional Comment:    _______________________________________________ Nila Nephew, LCSW 08/02/2017, 2:56 PM

## 2017-08-02 NOTE — Discharge Summary (Addendum)
George Stokes, is a 72 y.o. male  DOB July 23, 1945  MRN [id number].  Admission date:  07/07/2017  Admitting Physician  Hillary Bow, DO  Discharge Date:  08/02/2017   Primary MD  Patient, No Pcp Per  Recommendations for primary care physician for things to follow:   1)Discharge to Skilled Nursing Facility 2)Patient needs chemotherapy once a week for 5 weeks first dose 08/01/2017, next dose 08/10/2017 3)CBC and BMP every Monday 4)Daily Radiation Therapy every Monday through Friday starting 08/01/2017 for 5 weeks 5)Osmolyte 1.5 tube feed via J-tube at 60 mL an hour continuously, water flushes 50 mL every 4 hours via G-tube 6) dietitian/nutritionist consult at rehab facility  Radiation Therapy Monday thru Friday for 5 weeks (started 08/11/17) Weekly ChemoRx for 5 weeks (first dose 08/01/17, next dose 08/10/17)  Admission Diagnosis  Acute kidney injury (HCC) [N17.9]   Discharge Diagnosis  Acute kidney injury (HCC) [N17.9]    Principal Problem:   Adenocarcinoma of esophagus (HCC) Active Problems:   Essential hypertension   COPD with hypoxia (HCC)   Chronic respiratory failure with hypoxia (HCC)   Intractable nausea and vomiting   CAP (community acquired pneumonia)   Lobar pneumonia (HCC)   Hypomagnesemia   Hypokalemia   Protein-calorie malnutrition, severe (HCC)   Esophageal dysphagia   Esophageal mass   Esophageal stricture   Esophageal cancer (HCC)   Malnutrition of moderate degree   H/O exploratory laparotomy   Acute respiratory failure (HCC)   Atelectasis of right lung   Pressure injury of skin      Past Medical History:  Diagnosis Date  . COPD (chronic obstructive pulmonary disease) (HCC)   . Dyspnea   . esophageal ca dx'd 07/13/17   mass at GE junction  . Esophageal mass   . Gout   . Heart murmur    during teenage years   . Hypertension   . On home oxygen therapy    1-2 L/M   .  Pneumonia     Past Surgical History:  Procedure Laterality Date  . ESOPHAGOGASTRODUODENOSCOPY (EGD) WITH PROPOFOL N/A 07/13/2017   Procedure: ESOPHAGOGASTRODUODENOSCOPY (EGD) WITH PROPOFOL;  Surgeon: Napoleon Form, MD;  Location: [facility];  Service: Endoscopy;  Laterality: N/A;  . LAPAROTOMY N/A 07/15/2017   Procedure: FEEDING JEJUNOSTOMY TUBE PLACEMENT;  Surgeon: Claud Kelp, MD;  Location: [facility];  Service: General;  Laterality: N/A;  . LAPAROTOMY N/A 07/20/2017   Procedure: EXPLORATORY LAPAROTOMY; REVISION OF JEJUNOSTOMY;  Surgeon: Glenna Fellows, MD;  Location: [facility];  Service: General;  Laterality: N/A;  . TONSILLECTOMY         HPI  from the history and physical done on the day of admission:   Chief Complaint:  n/v  HPI: George Stokes is a 72 y.o. male with medical history significant of COPD, CRF on 4 liters he thinks at home, htn comes in with over a week of nausea and vomiting with no abdominal pain or blood in vomit.  No fevers.  No diarrhea. No cough.  No  chest pain.  He says he gets nauseated even after drinking liquids.  Pt referred for admission for report of wt loss and his n/v.    Hospital Course:   Brief Summary:- 72 y.o. male reformed smoker with COPD, Chronic Hypoxic Resp Failure  on 4 liters , HTN and history of alcohol abuse admitted on 07/08/17 with N/V and a 20 pound weight loss. S/P J tube placement, with complicated course due to SBO and leakage out of J tube. S/P Ex-Lap and jejunostomy revision. EGD performed 07/13/17 found malignant appearing esophageal stenosis/ annular adenocarcinoma (pathology/cytology was not definitive for malignancy), awaiting radiation and chemotherapy,  On 07/15/17  Had placement of a jejunostomy feeding tube , He developed an obstruction at the site of the feeding tube 07/20/2017 and was taken to the operating room for revision. Following this procedure he had prolonged intubation and was extubated 07/25/2017. He underwent a  right thoracentesis 07/26/2017, he cytology revealed acute inflammation (no malignant cells)   Plan:- 1)GE junction malignant esophageal stricture/ annular adenocarcinoma-input from Dr Christella Hartigan (Gi) and Dr Truett Perna (oncology) appreciated, please note there is no definitive/confirmatory histological diagnosis at this time. Pt started  5 weeks of daily (Monday thru Friday) XRT on 08/01/17 , will have concurrent once weekly chemoRx  with Taxol/carboplatin (started 08/01/17), next chemoRx is 08/10/17. Hopefully w/ XRT the esophageal stricture may improve. Would continue strict NPO, give zantac   2)FEN/Solid and Liquid Dysphagia-tolerating tube feed well, n.p.o.,c/n Osmolite 1.5 @ 60 mL/hr via J-tube which will provides 2160 kcal, 90 grams of protein, and 1097 mL free water.,  Continue 50 mL free water every 4 hours via J-tube (300 mL/day), c/n thiamine and folic acid/multivitamin the patient with history of alcohol abuse, no frank emesis.  Patient is still unable to tolerate oral intake  3)Candida Respiratory infection- BAL cultures growing Candida albicans, discussed with Dr. Marchelle Gearing, okay to treat with Diflucan for 1 week (first dose 07/28/2017)  4)HTN-stable, continue metoprolol 25 mg twice daily  5)Social/Ethics- Full Code,    6)Disposition-patient has significant post acute illness debility/weakness and poor endurance, physical therapy evaluation and social work consult appreciated, awaiting insurance approval for placement to skilled nursing facility rehab (pt needs  5 weeks of daily (Monday thru Friday) XRT and concurrent once weekly chemoRx (Taxol/carboplatin)  Starting on  08/01/17), rehab facility will have to arrange for daily transportation to cancer Center for radiation therapy Monday through Fridays  7)Moderate to Severe Protein Caloric Malnutrition- Nutritional supplements as above #2   Consults  :  Gi/PCCMM/Oncology ([place])  Procedures:-  Radiation Therapy Monday thru Friday  for 5 weeks (started 08/11/17) Weekly ChemoRx for 5 weeks (first dose 08/01/17, next dose 08/10/17 EGD from 07/13/2017 with malignant esophageal stricture (biopsy not conclusive) J-tube placed on 07/15/2017 Revision of J-tube on 07/20/2017 Initially extubated 07/21/2017 Reintubated 07/23/2017 Extubated 07/25/2017 NG tube placement by interventional radiology on 07/26/2017   Discharge Condition: improved  Follow UP   Contact information for follow-up providers    Claud Kelp, MD. Go on 08/15/2017.   Specialty:  General Surgery Why:  Your appointment is at 9:15 AM.  Please arrive 15 minutes prior to your appointment to check in. Contact information: [address] [place] [address] [phone]            Contact information for after-discharge care    Destination    [facility] Follow up.   Service:  Skilled Paramedic information: [address] [place] [address]  [phone]                  Diet and Activity recommendation:  As advised  Discharge Instructions     Discharge Instructions    Call MD for:   Complete by:  As directed    Call MD for:  difficulty breathing, headache or visual disturbances   Complete by:  As directed    Call MD for:  persistant dizziness or light-headedness   Complete by:  As directed    Call MD for:  persistant nausea and vomiting   Complete by:  As directed    Call MD for:  severe uncontrolled pain   Complete by:  As directed    Call MD for:  temperature >100.4   Complete by:  As directed    Diet general   Complete by:  As directed    Discharge instructions   Complete by:  As directed    1)Discharge to Skilled Nursing Facility 2)Patient needs chemotherapy once a week for 5 weeks first dose 08/01/2017, next dose 08/10/2017 3)CBC and BMP every Monday 4)Daily Radiation Therapy every Monday through Friday starting 08/01/2017 for 5 weeks 5)Osmolyte 1.5 tube feed via J-tube  at 60 mL an hour continuously, water flushes 50 mL every 4 hours via G-tube 6) dietitian/nutritionist consult at rehab facility  Radiation Therapy Monday thru Friday for 5 weeks (started 08/11/17) Weekly ChemoRx for 5 weeks (first dose 08/01/17, next dose 08/10/17)   Increase activity slowly   Complete by:  As directed    SCHEDULING COMMUNICATION   Complete by:  As directed    Chemotherapy Appointment - 3.5 hr   TREATMENT CONDITIONS   Complete by:  As directed    Patient should have CBC & CMP within 7 days prior to chemotherapy administration. NOTIFY MD IF: ANC < 1500, Hemoglobin < 8, PLT < 100,000,  Total Bili > 1.5, Creatinine > 1.5, ALT & AST > 80 or if patient has unstable vital signs: Temperature > 38.5, SBP > 180 or < 90, RR > 30 or HR > 100.        Discharge Medications     Allergies as of 08/02/2017   No Known Allergies     Medication List    STOP taking these medications   indomethacin 50 MG capsule Commonly known as:  INDOCIN     TAKE these medications   albuterol 108 (90 Base) MCG/ACT inhaler Commonly known as:  PROVENTIL HFA;VENTOLIN HFA Inhale 2 puffs into the lungs every 6 (six) hours as needed for wheezing or shortness of breath. What changed:    how much to take  how to take this  when to take this  reasons to take this  additional instructions   amLODipine 10 MG tablet Commonly known as:  NORVASC Take 1 tablet (10 mg total) by mouth daily.   clotrimazole-betamethasone cream Commonly known as:  LOTRISONE Apply 1 application topically 2 (two) times daily.   feeding supplement (OSMOLITE 1.5 CAL) Liqd 60 mL an hour via PEG tube continuously--with water flushes 50 mL every 4 hours   fluconazole 10 MG/ML suspension Commonly known as:  DIFLUCAN Place 10 mLs (100 mg total) into feeding tube daily for 3 days.   free water Soln Place 50 mLs into feeding tube every 4 (four) hours.   heparin lock flush 100 UNIT/ML Soln injection 2.5 mLs (250 Units  total) by Intracatheter route once as needed (Hickman or PICC flush).   HYDROcodone-acetaminophen 5-325 MG tablet  Commonly known as:  NORCO/VICODIN Take 1 tablet by mouth every 6 (six) hours as needed for severe pain. What changed:  reasons to take this   ipratropium-albuterol 0.5-2.5 (3) MG/3ML Soln Commonly known as:  DUONEB Take 3 mLs by nebulization 3 (three) times daily.   metoprolol tartrate 25 mg/10 mL Susp Commonly known as:  LOPRESSOR Place 10 mLs (25 mg total) into feeding tube 2 (two) times daily.   multivitamin Liqd Take 15 mLs by mouth daily. Start taking on:  08/03/2017   ondansetron 4 MG tablet Commonly known as:  ZOFRAN Take 1 tablet (4 mg total) by mouth every 8 (eight) hours as needed for nausea or vomiting.   Oxycodone HCl 10 MG Tabs Commonly known as:  ROXICODONE Take 1 tablet (10 mg total) by mouth every 4 (four) hours as needed for severe pain.   ranitidine 150 MG/10ML syrup Commonly known as:  ZANTAC Place 10 mLs (150 mg total) into feeding tube 2 (two) times daily.   SYMBICORT 160-4.5 MCG/ACT inhaler Generic drug:  budesonide-formoterol Inhale 2 puffs into the lungs 2 (two) times daily.   Tiotropium Bromide Monohydrate 2.5 MCG/ACT Aers Commonly known as:  SPIRIVA RESPIMAT Inhale 2 puffs into the lungs daily.   zolpidem 5 MG tablet Commonly known as:  AMBIEN Take 1 tablet daily at bedtime as needed       Major procedures and Radiology Reports - PLEASE review detailed and final reports for all details, in brief -   Ct Abdomen Pelvis Wo Contrast  Result Date: 07/08/2017 CLINICAL DATA:  72 year old male with nausea vomiting. EXAM: CT CHEST, ABDOMEN AND PELVIS WITHOUT CONTRAST TECHNIQUE: Multidetector CT imaging of the chest, abdomen and pelvis was performed following the standard protocol without IV contrast. COMPARISON:  Radiograph of the chest abdomen pelvis dated 07/07/2017 FINDINGS: Evaluation of this exam is limited in the absence of  intravenous contrast. CT CHEST FINDINGS Cardiovascular: There is no cardiomegaly. Coronary vascular calcification primarily involving the LAD. Small anterior pericardial effusion measuring 8 mm in thickness. There is mild atherosclerotic calcification of the thoracic aorta. The thoracic aorta and central pulmonary arteries are grossly unremarkable on this noncontrast CT. Mediastinum/Nodes: No hilar or mediastinal adenopathy. The esophagus contains a mixed density fluid content with areas of higher attenuation, likely residual oral contrast. Findings likely represent reflux or esophageal dysmotility and delayed emptying. Blood product is less likely. Clinical correlation is recommended. Endoscopy may provide better evaluation of the esophagus if clinically indicated. Lungs/Pleura: There is emphysematous changes of the lungs. Patchy area of nodular airspace density primarily involving the right middle lobe and anterior right upper lobe most consistent with pneumonia. Clinical correlation and follow-up to resolution recommended. There is mild bronchiectatic changes. No pneumothorax or pleural effusion. Endobronchial content in the region of the carina and proximal portion of the left mainstem bronchus likely mucous plugging. Musculoskeletal: There is degenerative changes of the spine. Old healed left rib fractures. No acute osseous pathology. CT ABDOMEN PELVIS FINDINGS No intra-abdominal free air or free fluid. Hepatobiliary: The liver is unremarkable. No intrahepatic biliary ductal dilatation. There is probable sludge or small stones within the gallbladder. No pericholecystic fluid or evidence of acute cholecystitis by CT. Pancreas: Unremarkable. No pancreatic ductal dilatation or surrounding inflammatory changes. Spleen: Normal in size without focal abnormality. Adrenals/Urinary Tract: The adrenal glands are unremarkable. Multiple bilateral renal hypodense lesions are not characterized on this CT but appear to  demonstrate fluid attenuation. Ultrasound may provide better characterisation. Punctate nonobstructing right renal calculi versus  vascular calcification. There is no hydronephrosis or obstructing stone on either side. The visualized ureters and urinary bladder appear unremarkable. Stomach/Bowel: There is a small hiatal hernia. Oral contrast opacifies multiple loops of small bowel. There is no evidence of bowel obstruction or active inflammation. There is sigmoid diverticulosis without active inflammation. Normal appendix. Vascular/Lymphatic: Advanced aortoiliac atherosclerotic disease. The IVC is grossly unremarkable. No portal venous gas. There is no adenopathy. Reproductive: The prostate and seminal vesicles are grossly unremarkable. Other: None Musculoskeletal: Osteopenia with degenerative changes of the lower lumbar spine. No acute osseous pathology. IMPRESSION: 1. Patchy areas of nodular airspace density primarily in the right middle lobe and right upper lobe most consistent with pneumonia. Clinical correlation and follow-up to resolution after treatment recommended. 2. Emphysema.  No pleural effusion or pneumothorax. 3. Mucous plugging in the proximal left mainstem bronchus. 4. Sigmoid diverticulosis. No bowel obstruction or active inflammation. Normal appendix. 5. Bilateral renal hypodense lesions, incompletely characterized. Ultrasound may provide better evaluation. No hydronephrosis or obstructing stone. 6. Aortic Atherosclerosis (ICD10-I70.0) and Emphysema (ICD10-J43.9). Electronically Signed   By: Elgie Collard M.D.   On: 07/08/2017 05:47   Dg Chest 1 View  Result Date: 07/20/2017 CLINICAL DATA:  Shortness of breath, weakness. History of esophageal malignancy, COPD on home oxygen, former smoker. EXAM: CHEST 1 VIEW COMPARISON:  Chest x-ray of July 19, 2017 FINDINGS: There is persistent infiltrate at the right lung base. There no large pleural effusion. The left lung is clear. The heart and  pulmonary vascularity are normal. There is mild tortuosity of the ascending thoracic aorta. The bony thorax is unremarkable. IMPRESSION: COPD. Right basilar pneumonia little changed from yesterday's study. Electronically Signed   By: David  [place] M.D.   On: 07/20/2017 15:12   Dg Abd 1 View  Result Date: 07/26/2017 CLINICAL DATA:  NG tube advancement EXAM: ABDOMEN - 1 VIEW COMPARISON:  07/25/2017 FINDINGS: NG tube looped in the distal esophagus. Right lower lobe opacity with small right pleural effusion. Skin staples overlying the upper abdomen. IMPRESSION: NG tube looped in the distal esophagus. These results will be called to the ordering clinician or representative by the Radiologist Assistant, and communication documented in the PACS or zVision Dashboard. Electronically Signed   By: Charline Bills M.D.   On: 07/26/2017 09:32   Dg Abd 1 View  Result Date: 07/25/2017 CLINICAL DATA:  Status post NG tube placement today. EXAM: ABDOMEN - 1 VIEW COMPARISON:  CT abdomen and pelvis 07/20/2017. Single-view of the chest 07/25/2017. FINDINGS: The patient has 2 NG tubes in place. Tip and side-port of 1 of the tubes are in the stomach. Tip of the second tube is at the gastroesophageal junction and should be advanced 7-8 cm. Endotracheal tube is unchanged and in good position. Lungs are emphysematous. Right basilar airspace disease and effusion are unchanged. Heart size is normal. No pneumothorax. IMPRESSION: Two NG tubes are identified. One is in good position. Tip of the second is at gastroesophageal junction and should be advanced 7-8 cm. Endotracheal tube in good position. No change in a right effusion and airspace disease. Emphysema. Electronically Signed   By: Drusilla Kanner M.D.   On: 07/25/2017 10:20   Ct Chest Wo Contrast  Result Date: 07/08/2017 CLINICAL DATA:  72 year old male with nausea vomiting. EXAM: CT CHEST, ABDOMEN AND PELVIS WITHOUT CONTRAST TECHNIQUE: Multidetector CT imaging of the chest,  abdomen and pelvis was performed following the standard protocol without IV contrast. COMPARISON:  Radiograph of the chest abdomen pelvis dated 07/07/2017  FINDINGS: Evaluation of this exam is limited in the absence of intravenous contrast. CT CHEST FINDINGS Cardiovascular: There is no cardiomegaly. Coronary vascular calcification primarily involving the LAD. Small anterior pericardial effusion measuring 8 mm in thickness. There is mild atherosclerotic calcification of the thoracic aorta. The thoracic aorta and central pulmonary arteries are grossly unremarkable on this noncontrast CT. Mediastinum/Nodes: No hilar or mediastinal adenopathy. The esophagus contains a mixed density fluid content with areas of higher attenuation, likely residual oral contrast. Findings likely represent reflux or esophageal dysmotility and delayed emptying. Blood product is less likely. Clinical correlation is recommended. Endoscopy may provide better evaluation of the esophagus if clinically indicated. Lungs/Pleura: There is emphysematous changes of the lungs. Patchy area of nodular airspace density primarily involving the right middle lobe and anterior right upper lobe most consistent with pneumonia. Clinical correlation and follow-up to resolution recommended. There is mild bronchiectatic changes. No pneumothorax or pleural effusion. Endobronchial content in the region of the carina and proximal portion of the left mainstem bronchus likely mucous plugging. Musculoskeletal: There is degenerative changes of the spine. Old healed left rib fractures. No acute osseous pathology. CT ABDOMEN PELVIS FINDINGS No intra-abdominal free air or free fluid. Hepatobiliary: The liver is unremarkable. No intrahepatic biliary ductal dilatation. There is probable sludge or small stones within the gallbladder. No pericholecystic fluid or evidence of acute cholecystitis by CT. Pancreas: Unremarkable. No pancreatic ductal dilatation or surrounding  inflammatory changes. Spleen: Normal in size without focal abnormality. Adrenals/Urinary Tract: The adrenal glands are unremarkable. Multiple bilateral renal hypodense lesions are not characterized on this CT but appear to demonstrate fluid attenuation. Ultrasound may provide better characterisation. Punctate nonobstructing right renal calculi versus vascular calcification. There is no hydronephrosis or obstructing stone on either side. The visualized ureters and urinary bladder appear unremarkable. Stomach/Bowel: There is a small hiatal hernia. Oral contrast opacifies multiple loops of small bowel. There is no evidence of bowel obstruction or active inflammation. There is sigmoid diverticulosis without active inflammation. Normal appendix. Vascular/Lymphatic: Advanced aortoiliac atherosclerotic disease. The IVC is grossly unremarkable. No portal venous gas. There is no adenopathy. Reproductive: The prostate and seminal vesicles are grossly unremarkable. Other: None Musculoskeletal: Osteopenia with degenerative changes of the lower lumbar spine. No acute osseous pathology. IMPRESSION: 1. Patchy areas of nodular airspace density primarily in the right middle lobe and right upper lobe most consistent with pneumonia. Clinical correlation and follow-up to resolution after treatment recommended. 2. Emphysema.  No pleural effusion or pneumothorax. 3. Mucous plugging in the proximal left mainstem bronchus. 4. Sigmoid diverticulosis. No bowel obstruction or active inflammation. Normal appendix. 5. Bilateral renal hypodense lesions, incompletely characterized. Ultrasound may provide better evaluation. No hydronephrosis or obstructing stone. 6. Aortic Atherosclerosis (ICD10-I70.0) and Emphysema (ICD10-J43.9). Electronically Signed   By: Elgie Collard M.D.   On: 07/08/2017 05:47   Ct Abdomen Pelvis W Contrast  Result Date: 07/20/2017 CLINICAL DATA:  Recently diagnosed esophageal cancer. Abdominal distention. Recent  jejunostomy (5 days postop) tube with fluid leak concerning for ascites EXAM: CT ABDOMEN AND PELVIS WITH CONTRAST TECHNIQUE: Multidetector CT imaging of the abdomen and pelvis was performed using the standard protocol following bolus administration of intravenous contrast. CONTRAST:  100 cc Isovue-300 intravenous COMPARISON:  07/08/2017 FINDINGS: Lower chest: Airspace opacity in the right lower lobe. Improved airspace disease in the right middle lobe. Suspect aspiration given the chronically distended esophagus above the GE junction mass. Small bilateral pleural effusion. Hepatobiliary: Geographic low-density in segment 5 and 8 of the liver without visible  underlying lesions seen. Negative gallbladder. Pancreas: Unremarkable. Spleen: Unremarkable. Adrenals/Urinary Tract: Negative adrenals. No hydronephrosis or stone. Moderate scarring involving the upper pole left renal cortex. Bilateral simple appearing renal cysts. Unremarkable bladder. Stomach/Bowel: Recent jejunostomy in unremarkable position. The bowel is kinked at the level of the enterotomy and there is marked proximal small bowel and stomach distension. Small volume pneumoperitoneum which is expected after recent surgery. Distended lower esophagus above a GE junction mass. Colonic diverticulosis. Vascular/Lymphatic: Atherosclerosis with advanced atheromatous wall thickening of the aorta. No definite adenopathy. PET-CT would be more sensitive in this setting. Reproductive:No pathologic findings. Other: No ascites or pneumoperitoneum. Musculoskeletal: No acute abnormalities. A call has been placed to the ordering provider. IMPRESSION: 1. High-grade proximal small bowel obstruction with transition at the jejunostomy tube. There is a degree of closed loop physiology given the GE junction mass and severe stenosis. 2. Dilated esophagus above the GE junction mass. There is shifting opacity at the right base concerning for recurrent aspiration. 3. Small pleural  effusions. 4. Perfusion anomaly in the ventral right lobe liver without visible underlying cause. Attention on future staging scans. Electronically Signed   By: Marnee Spring M.D.   On: 07/20/2017 15:07   Dg Esophagus  Result Date: 07/11/2017 CLINICAL DATA:  Persistent dysphagia, nausea/vomiting, unable to tolerate more than a tablespoon of food at a time, decreased appetite EXAM: ESOPHOGRAM/BARIUM SWALLOW TECHNIQUE: Single contrast examination was performed using  thin barium. FLUOROSCOPY TIME:  Fluoroscopy Time:  3.1 minutes Radiation Exposure Index (if provided by the fluoroscopic device): 1.9 mGy Number of Acquired Spot Images: 5 COMPARISON:  CT chest abdomen pelvis dated 07/08/2017 FINDINGS: Moderate esophageal dysmotility with fluid/debris in the mid/distal esophagus. Marked narrowing/stricture of the distal esophagus just above the GE junction. After significant delay, trace contrast passed into the proximal stomach. When correlating with the recent CT, there is no evidence of esophageal mass. However, a fat containing hernia at the esophageal hiatus is present, possibly leading to extrinsic compression. IMPRESSION: Severe stricture of the distal esophagus just above the GE junction. Secondary esophageal dysmotility. When correlating with recent CT, there is no evidence of esophageal mass. Possible extrinsic compression secondary to a fat containing hernia. Consider endoscopy for further evaluation. Electronically Signed   By: Charline Bills M.D.   On: 07/11/2017 10:21   US Renal  Result Date: 07/09/2017 CLINICAL DATA:  Elevated creatinine EXAM: RENAL / URINARY TRACT ULTRASOUND COMPLETE COMPARISON:  CT 07/08/2017 FINDINGS: Right Kidney: Length: 10.6 cm. Echogenicity within normal limits. No mass or hydronephrosis visualized. Left Kidney: Length: 10.3 cm. Multiple cysts in the left kidney, the largest 4.5 cm. These appear benign. No hydronephrosis. Bladder: Appears normal for degree of bladder  distention. IMPRESSION: Left renal cysts. No acute findings.  No hydronephrosis. Electronically Signed   By: Charlett Nose M.D.   On: 07/09/2017 15:56   Dg Abdomen Peg Tube Location  Result Date: 07/19/2017 CLINICAL DATA:  Jejunostomy tube leaking. EXAM: ABDOMEN - 1 VIEW COMPARISON:  07/19/2016. FINDINGS: KUB following nonionic contrast administration into enterostomy tube obtained. Enterostomy tube tip noted within a right-sided abdominal small-bowel loop. No definite leakage noted. Contrast from prior study noted in the colon. IMPRESSION: Enterostomy tube tip noted in a small-bowel loop in the right abdomen. Electronically Signed   By: Maisie Fus  Register   On: 07/19/2017 16:41   Dg Chest Port 1 View  Result Date: 07/27/2017 CLINICAL DATA:  Atelectasis. EXAM: PORTABLE CHEST 1 VIEW COMPARISON:  Radiographs of July 26, 2016. FINDINGS: Stable cardiomediastinal  silhouette. Stable right lower lobe opacity is noted concerning for pneumonia. No pneumothorax is noted. Mild left basilar subsegmental atelectasis is noted. Bony thorax is unremarkable. IMPRESSION: Stable right lower lobe pneumonia. Electronically Signed   By: Lupita Raider, M.D.   On: 07/27/2017 08:07   Dg Chest Port 1 View  Result Date: 07/26/2017 CLINICAL DATA:  Thoracentesis. EXAM: PORTABLE CHEST 1 VIEW COMPARISON:  One-view chest x-ray from the same day. FINDINGS: The heart size is normal. Interstitial and airspace disease at the right lung base is slightly improved. There is no pneumothorax. Mild left base airspace disease present. Emphysematous changes are noted. The NG tube was removed. IMPRESSION: 1. Persistent right lower lobe interstitial and airspace disease concerning for pneumonia. 2. Partial clearing of the right upper lobe. 3. Mild atelectasis left base. 4. COPD. 5. Interval removal of NG tube. Electronically Signed   By: Marin Roberts M.D.   On: 07/26/2017 14:03   Dg Chest Port 1 View  Result Date: 07/26/2017 CLINICAL  DATA:  72 year old male with obstructing esophageal cancer. Postoperative day 6 status post abdominal surgery for small bowel obstruction at a jejunostomy tube site. EXAM: PORTABLE CHEST 1 VIEW COMPARISON:  07/25/2017 and earlier. FINDINGS: Portable AP semi upright view at 0454 hours. Extubated. Enteric tube remains in place, but is looped in the distal esophagus level. Yesterday the tube appear to course into the stomach. Continued volume loss in the right lung with mild rightward shift of the mediastinum. Superimposed veiling right lung opacity suggesting pleural effusion. Large left lung volume. The left lung remains clear. Stable mediastinal contours. IMPRESSION: 1. Enteric tube now is looped in the distal esophagus, whereas yesterday tube appeared to course into the stomach. 2. Extubated. 3. Suspect combined lower lobe collapse and pleural effusion in the right lung. Consider mucous plug. 4. The left lung remains clear. Electronically Signed   By: Odessa Fleming M.D.   On: 07/26/2017 07:41   Dg Chest Port 1 View  Result Date: 07/25/2017 CLINICAL DATA:  Respiratory failure EXAM: PORTABLE CHEST 1 VIEW COMPARISON:  Portable chest x-ray of July 24, 2017 FINDINGS: There is increased density in the right mid and lower lung as compared to yesterday's study. The left lung is clear. The heart and pulmonary vascularity are normal. The endotracheal tube tip lies approximately 3.8 cm above the carina. The esophagogastric tube tip in proximal port project below the GE junction. IMPRESSION: Increased density on the right consistent with worsening atelectasis or pneumonia and likely a posterior layering pleural effusion. Underlying COPD. The support tubes are in reasonable position. Electronically Signed   By: David  [place] M.D.   On: 07/25/2017 06:55   Dg Chest Port 1 View  Result Date: 07/24/2017 CLINICAL DATA:  Follow-up ventilator support. EXAM: PORTABLE CHEST 1 VIEW COMPARISON:  07/23/2017 FINDINGS: Endotracheal tube  tip is 4 cm above the carina. Nasogastric tube enters the abdomen. Background emphysema. Left lung is otherwise clear. Persistent pneumonia and volume loss in the right lower lung. I think there is slight radiographic improvement. IMPRESSION: Slight improvement in lower right lung pneumonia. Electronically Signed   By: Paulina Fusi M.D.   On: 07/24/2017 06:53   Dg Chest Port 1 View  Result Date: 07/23/2017 CLINICAL DATA:  Post intubation and enteric tube placement EXAM: PORTABLE CHEST 1 VIEW COMPARISON:  07/22/2017 FINDINGS: Endotracheal tube placed with tip measuring 3 cm above the carina. Enteric tube tip is coiled in the left upper quadrant consistent with location in the upper stomach.  Volume loss and consolidation in the right lung with hyperinflation of the left lung. Probable right pleural effusion. Right lung changes are progressing since the previous study. No pneumothorax. IMPRESSION: Appliances appear in satisfactory position. Progression of consolidation, effusion, and volume loss in the right lung since previous study. Compensatory hyperinflation of the left lung. Electronically Signed   By: Burman Nieves M.D.   On: 07/23/2017 03:51   Dg Chest Port 1 View  Result Date: 07/22/2017 CLINICAL DATA:  Status postextubation.  Airspace consolidation. EXAM: PORTABLE CHEST 1 VIEW COMPARISON:  July 21, 2017 FINDINGS: Endotracheal tube and nasogastric tube have been removed. No pneumothorax. There is airspace consolidation throughout the right mid lower lung zones, increased from 1 day prior. There is a small right pleural effusion. The left lung is somewhat hyperexpanded. There is no edema or consolidation on the left. Heart size and pulmonary vascular normal. No adenopathy appreciable. Old healed fractures noted on the left. IMPRESSION: No pneumothorax. Increase in airspace consolidation in the right mid and lower lung zones with small right pleural effusion. Question progression of pneumonia versus  aspiration. Both entities may be present concurrently. Left lung hyperexpanded but clear. Stable cardiac silhouette. Electronically Signed   By: Bretta Bang III M.D.   On: 07/22/2017 07:32   Portable Chest Xray  Result Date: 07/21/2017 CLINICAL DATA:  Endotracheal tube positioning EXAM: PORTABLE CHEST 1 VIEW COMPARISON:  Yesterday FINDINGS: Endotracheal tube tip between the clavicular heads and carina. An orogastric tube reaches the stomach. Lung opacity at the right more than left base. Small pleural effusions by abdominal CT yesterday. Large lung volumes compatible with COPD. IMPRESSION: 1. Stable positioning of endotracheal and orogastric tubes. 2. Small pleural effusions and right more than left lower lobe opacity. Clinical circumstances primarily concerning for aspiration pneumonitis/pneumonia. Electronically Signed   By: Marnee Spring M.D.   On: 07/21/2017 07:07   Portable Chest X-ray  Result Date: 07/20/2017 CLINICAL DATA:  72 year old male with a history of endotracheal tube placement EXAM: PORTABLE CHEST 1 VIEW COMPARISON:  07/20/2017 abdominal CT 07/20/2017 FINDINGS: Cardiomediastinal silhouette unchanged in size and contour. Interval placement of endotracheal tube, terminating suitably above the carina approximately 4 cm. Interval placement of gastric tube, terminating out of the field of view. Similar appearance of bibasilar opacities, more pronounced on the right. No pneumothorax IMPRESSION: Interval placement of endotracheal tube, terminating suitably above the carina. Interval placement of gastric tube, terminating out of the field of view. Similar appearance of right greater than left airspace disease Electronically Signed   By: Gilmer Mor D.O.   On: 07/20/2017 20:01   Dg Chest Port 1 View  Result Date: 07/19/2017 CLINICAL DATA:  Shortness of breath. EXAM: PORTABLE CHEST 1 VIEW COMPARISON:  CT 07/08/2016.  Chest x-ray 01/13/2016. FINDINGS: Mediastinum hilar structures normal.  Cardiomegaly with normal pulmonary vascularity. COPD. Right base infiltrate with small right pleural effusion. IMPRESSION: 1. Right base infiltrate consistent with pneumonia. Small right pleural effusion. Similar findings noted on prior CT of 07/08/2016. 2.  COPD. Electronically Signed   By: Maisie Fus  Register   On: 07/19/2017 13:51   Dg Abd Acute W/chest  Result Date: 07/08/2017 CLINICAL DATA:  Vomiting for 10 days.  Weight loss. EXAM: DG ABDOMEN ACUTE W/ 1V CHEST COMPARISON:  Chest radiograph 01/13/2016 FINDINGS: The lungs are hyperexpanded with multiple areas of increased lucency. No focal airspace consolidation or pulmonary edema. No pneumothorax or sizable pleural effusion. No free intraperitoneal air. No dilated loops of bowel are visible. The round  densities overlying the left psoas muscle may be within the transverse colon. IMPRESSION: 1. COPD without acute airspace disease. 2. No free intraperitoneal air or evidence of small-bowel obstruction. Electronically Signed   By: Deatra Robinson M.D.   On: 07/08/2017 00:07   Dg Abd Portable 1v  Result Date: 07/27/2017 CLINICAL DATA:  Gastroesophageal ca, f/u feeding tube placement, some fullness feeling in stomach EXAM: PORTABLE ABDOMEN - 1 VIEW COMPARISON:  Radiograph 129 19 FINDINGS: Enteric tube projects over the RIGHT lower quadrant. No dilated large or small bowel. Small amount of high-density stool remains in rectum IMPRESSION: No bowel obstruction Electronically Signed   By: Genevive Bi M.D.   On: 07/27/2017 10:13   Dg Abd Portable 1v  Result Date: 07/19/2017 CLINICAL DATA:  COPD, nausea vomiting EXAM: PORTABLE ABDOMEN - 1 VIEW COMPARISON:  None. FINDINGS: No dilated large or small bowel. Jejunostomy tube with tip in the RIGHT lower quadrant. Midline skin staples noted. No evidence bowel obstruction. Oral contrast from prior CT within the colon IMPRESSION: 1. No evidence of bowel obstruction. 2. Jejunostomy tube in place. Electronically Signed    By: Genevive Bi M.D.   On: 07/19/2017 10:34   Dg Kayleen Memos W/o Kub  Result Date: 07/26/2017 CLINICAL DATA:  72 year old male with esophageal cancer. Subsequent encounter. EXAM: UPPER GI SERIES WITHOUT KUB TECHNIQUE: Routine upper GI series was performed with thin barium. FLUOROSCOPY TIME:  Fluoroscopy Time:  1 minute and 48 seconds Radiation Exposure Index: 5.6 mGy COMPARISON:  07/26/2017 chest x-ray.  07/20/2017 CT. FINDINGS: Patient's recent nasogastric tube was removed by nursing staff prior to coming to radiology. Request for placing nasogastric tube and performing upper GI series and small-bowel follow-through. Procedure discussed with patient. Lidocaine gel utilized. Nasogastric tube gently advanced. Not able to advance into the stomach. Barium instilled. Barium did not traverses into the stomach. Barium was aspirated and nasogastric tube removed. IMPRESSION: Unsuccessful placement of nasogastric tube. Not able to performed upper GI series and small-bowel follow-through as noted above. Results discussed with patient's nurse with request to call covering physician with results. Electronically Signed   By: Lacy Duverney M.D.   On: 07/26/2017 11:48    Micro Results   Recent Results (from the past 240 hour(s))  Body fluid culture     Status: None   Collection Time: 07/26/17  1:00 PM  Result Value Ref Range Status   Specimen Description   Final    PLEURAL Performed at [facility], [address] [place], [place] [address]    Special Requests   Final    Normal Performed at [facility], [address], [place], [place] [address]    Gram Stain   Final    WBC PRESENT, PREDOMINANTLY PMN NO ORGANISMS SEEN CYTOSPIN SMEAR    Culture   Final    No growth aerobically or anaerobically. Performed at [facility], [address] [place], [place] [address]    Report Status 07/30/2017 FINAL  Final     Today   Subjective    George Stokes today has  no new complaints, tolerating radiation therapy well, brother-in-law at bedside          Patient has been seen and examined prior to discharge   Objective   Blood pressure (!) 107/52, pulse 80, temperature 97.7 F (36.5 C), temperature source Axillary, resp. rate 20, height 5' 7.25" (1.708 m), weight 59.4 kg (131 lb), SpO2 91 %.   Intake/Output Summary (Last 24  hours) at 08/02/2017 1514 Last data filed at 08/02/2017 1015 Gross per 24 hour  Intake 0 ml  Output 200 ml  Net -200 ml    Exam  Gen:- Awake Alert, patient looks emanciated, in no apparent distress  HEENT:- [place].AT, No sclera icterus Neck-Supple Neck,No JVD, Nose-  2 L/min Lungs-improved air movement bilaterally, no wheezing,  CV- S1, S2 normal Abd-  +ve B.Sounds, Abd Soft, healed surgical wound, no tenderness, J-tube site is clean dry and intact,    Extremity/Skin:- No  edema,    Neuro-no tremors, generalized weakness without new focal deficits Psych-affect is appropriate    Data Review   CBC w Diff:  Lab Results  Component Value Date   WBC 8.6 08/02/2017   HGB 10.2 (L) 08/02/2017   HGB 17.8 (H) 06/29/2017   HCT 31.8 (L) 08/02/2017   HCT 52.7 (H) 06/29/2017   PLT 290 08/02/2017   PLT 299 06/29/2017   LYMPHOPCT 3 08/02/2017   MONOPCT 1 08/02/2017   EOSPCT 0 08/02/2017   BASOPCT 0 08/02/2017    CMP:  Lab Results  Component Value Date   NA 141 08/02/2017   NA 143 06/29/2017   K 4.6 08/02/2017   CL 92 (L) 08/02/2017   CO2 43 (H) 08/02/2017   BUN 20 08/02/2017   BUN 28 (H) 06/29/2017   CREATININE 0.68 08/02/2017   CREATININE 1.07 05/01/2015   PROT 4.9 (L) 08/02/2017   PROT 7.3 06/29/2017   ALBUMIN 2.0 (L) 08/02/2017   ALBUMIN 4.5 06/29/2017   BILITOT 0.3 08/02/2017   BILITOT 0.6 06/29/2017   ALKPHOS 88 08/02/2017   AST 32 08/02/2017   ALT 46 08/02/2017    Total Discharge time is about 33 minutes  Shon Hale M.D on 08/02/2017 at 3:14 PM  [facility]   Office  [phone]  Voice  Recognition Reubin Milan dictation system was used to create this note, attempts have been made to correct errors. Please contact the author with questions and/or clarifications.

## 2017-08-02 NOTE — Care Management Important Message (Signed)
Important Message  Patient Details  Name: George Stokes MRN: [id number] Date of Birth: 1945-12-19   Medicare Important Message Given:  Yes    Kerin Salen 08/02/2017, 11:07 AMImportant Message  Patient Details  Name: George Stokes MRN: [id number] Date of Birth: 1945-11-29   Medicare Important Message Given:  Yes    Kerin Salen 08/02/2017, 11:07 AM

## 2017-08-02 NOTE — Progress Notes (Signed)
IP PROGRESS NOTE  Subjective:   George Stokes completed a cycle of chemotherapy yesterday.  He reports tolerating the chemotherapy well.  No sign of allergic reaction.  No acute nausea.  He reports mild nausea.  He is planning for discharge to [place] center this week. Objective: Vital signs in last 24 hours: Blood pressure 135/82, pulse 92, temperature (!) 97.4 F (36.3 C), temperature source Oral, resp. rate 20, height 5' 7.25" (1.708 m), weight 131 lb 5 oz (59.6 kg), SpO2 95 %.  Intake/Output from previous day: 02/04 0701 - 02/05 0700 In: -  Out: 650 [Urine:650]  Physical Exam: HEENT: No thrush Lungs: Distant breath sounds Cardiac: Regular rate and rhythm Abdomen: Healing midline incision, left abdomen feeding tube site without evidence of infection, the abdomen is soft Extremities: No leg edema    \ Medications: I have reviewed the patient's current medications.  Assessment/Plan: 1.  Esophagus cancer  Distal esophageal stricture noted on esophagram 07/11/2017  CTs of the chest, abdomen, and pelvis 07/08/2017-right lung pneumonia, emphysema, no evidence of metastatic disease  Upper endoscopy 07/13/2017- GE junction mass, could not be passed with standard endoscope, biopsy suspicious for adenocarcinoma  Right pleural fluid cytology 07/26/2017-negative  Initiation of weekly Taxol/carboplatin and radiation 08/01/2017  2.   Solid/liquid dysphagia secondary to #1  Placement of jejunostomy feeding tube 07/15/2017, revised 07/20/2017  3.   Weight loss/malnutrition, maintained on jejunostomy tube feedings  4.   COPD  5.   Heavy alcohol use  6.   Gout  7.   Hypertension  8.   Pneumonia  [name] tolerated the first cycle of Taxol/carboplatin well.  He will continue daily radiation.  He is stable for discharge to a skilled nursing facility from an oncology standpoint.  He will be scheduled for an office visit and week #2 Taxol/carboplatin on  08/10/2017.  Recommendations: 1.  Continue daily radiation 2.  Outpatient follow-up at the Cancer center for daily radiation and cycle #2 chemotherapy scheduled 08/10/2017    LOS: 25 days   Betsy Coder, MD   08/02/2017, 7:31 AM

## 2017-08-03 ENCOUNTER — Ambulatory Visit
Admission: RE | Admit: 2017-08-03 | Discharge: 2017-08-03 | Disposition: A | Discharge status: Home / Self Care | Payer: Medicare Other | Source: Ambulatory Visit | Attending: Radiation Oncology | Admitting: Radiation Oncology

## 2017-08-03 ENCOUNTER — Other Ambulatory Visit: Payer: Self-pay | Admitting: Radiation Oncology

## 2017-08-03 DIAGNOSIS — C160 Malignant neoplasm of cardia: Secondary | ICD-10-CM | POA: Diagnosis not present

## 2017-08-03 DIAGNOSIS — C159 Malignant neoplasm of esophagus, unspecified: Secondary | ICD-10-CM

## 2017-08-03 DIAGNOSIS — C155 Malignant neoplasm of lower third of esophagus: Secondary | ICD-10-CM | POA: Diagnosis not present

## 2017-08-03 DIAGNOSIS — Z510 Encounter for antineoplastic radiation therapy: Secondary | ICD-10-CM | POA: Diagnosis not present

## 2017-08-04 ENCOUNTER — Ambulatory Visit
Admission: RE | Admit: 2017-08-04 | Discharge: 2017-08-04 | Disposition: A | Discharge status: Home / Self Care | Payer: Medicare Other | Source: Ambulatory Visit | Attending: Radiation Oncology | Admitting: Radiation Oncology

## 2017-08-04 DIAGNOSIS — Z510 Encounter for antineoplastic radiation therapy: Secondary | ICD-10-CM | POA: Diagnosis not present

## 2017-08-04 DIAGNOSIS — K222 Esophageal obstruction: Secondary | ICD-10-CM | POA: Diagnosis not present

## 2017-08-04 DIAGNOSIS — R1319 Other dysphagia: Secondary | ICD-10-CM | POA: Diagnosis not present

## 2017-08-04 DIAGNOSIS — C155 Malignant neoplasm of lower third of esophagus: Secondary | ICD-10-CM | POA: Diagnosis not present

## 2017-08-04 DIAGNOSIS — C159 Malignant neoplasm of esophagus, unspecified: Secondary | ICD-10-CM | POA: Diagnosis not present

## 2017-08-04 DIAGNOSIS — C160 Malignant neoplasm of cardia: Secondary | ICD-10-CM | POA: Diagnosis not present

## 2017-08-04 DIAGNOSIS — J9611 Chronic respiratory failure with hypoxia: Secondary | ICD-10-CM | POA: Diagnosis not present

## 2017-08-05 ENCOUNTER — Telehealth: Payer: Self-pay | Admitting: *Deleted

## 2017-08-05 ENCOUNTER — Ambulatory Visit: Payer: Medicare Other

## 2017-08-05 DIAGNOSIS — J189 Pneumonia, unspecified organism: Secondary | ICD-10-CM | POA: Diagnosis not present

## 2017-08-05 DIAGNOSIS — M109 Gout, unspecified: Secondary | ICD-10-CM | POA: Diagnosis not present

## 2017-08-05 DIAGNOSIS — I10 Essential (primary) hypertension: Secondary | ICD-10-CM | POA: Diagnosis not present

## 2017-08-05 DIAGNOSIS — J9611 Chronic respiratory failure with hypoxia: Secondary | ICD-10-CM | POA: Diagnosis not present

## 2017-08-05 DIAGNOSIS — C159 Malignant neoplasm of esophagus, unspecified: Secondary | ICD-10-CM | POA: Diagnosis not present

## 2017-08-05 DIAGNOSIS — E46 Unspecified protein-calorie malnutrition: Secondary | ICD-10-CM | POA: Diagnosis not present

## 2017-08-05 NOTE — Telephone Encounter (Signed)
CALLED PATIENT TO INFORM OF PET SCAN ON 08-15-17- ARRIVAL TIME - 6:45 AM @ [facility], PT. TO BE NPO- AFTER MIDNIGHT, LVM FOR A RETURN CALL

## 2017-08-07 ENCOUNTER — Other Ambulatory Visit: Payer: Self-pay | Admitting: Oncology

## 2017-08-08 ENCOUNTER — Ambulatory Visit
Admission: RE | Admit: 2017-08-08 | Discharge: 2017-08-08 | Disposition: A | Discharge status: Home / Self Care | Payer: Medicare Other | Source: Ambulatory Visit | Attending: Radiation Oncology | Admitting: Radiation Oncology

## 2017-08-08 DIAGNOSIS — Z510 Encounter for antineoplastic radiation therapy: Secondary | ICD-10-CM | POA: Diagnosis not present

## 2017-08-08 DIAGNOSIS — C155 Malignant neoplasm of lower third of esophagus: Secondary | ICD-10-CM | POA: Diagnosis not present

## 2017-08-08 DIAGNOSIS — C160 Malignant neoplasm of cardia: Secondary | ICD-10-CM | POA: Diagnosis not present

## 2017-08-09 ENCOUNTER — Ambulatory Visit
Admission: RE | Admit: 2017-08-09 | Discharge: 2017-08-09 | Disposition: A | Discharge status: Home / Self Care | Payer: Medicare Other | Source: Ambulatory Visit | Attending: Radiation Oncology | Admitting: Radiation Oncology

## 2017-08-09 DIAGNOSIS — Z510 Encounter for antineoplastic radiation therapy: Secondary | ICD-10-CM | POA: Diagnosis not present

## 2017-08-09 DIAGNOSIS — C160 Malignant neoplasm of cardia: Secondary | ICD-10-CM | POA: Diagnosis not present

## 2017-08-09 DIAGNOSIS — C155 Malignant neoplasm of lower third of esophagus: Secondary | ICD-10-CM | POA: Diagnosis not present

## 2017-08-10 ENCOUNTER — Other Ambulatory Visit: Payer: Self-pay | Admitting: *Deleted

## 2017-08-10 ENCOUNTER — Inpatient Hospital Stay: Payer: Medicare Other | Attending: Oncology

## 2017-08-10 ENCOUNTER — Ambulatory Visit
Admission: RE | Admit: 2017-08-10 | Discharge: 2017-08-10 | Disposition: A | Discharge status: Home / Self Care | Payer: Medicare Other | Source: Ambulatory Visit | Attending: Radiation Oncology | Admitting: Radiation Oncology

## 2017-08-10 ENCOUNTER — Inpatient Hospital Stay: Payer: Medicare Other

## 2017-08-10 ENCOUNTER — Inpatient Hospital Stay (HOSPITAL_BASED_OUTPATIENT_CLINIC_OR_DEPARTMENT_OTHER): Payer: Medicare Other | Admitting: Oncology

## 2017-08-10 ENCOUNTER — Telehealth: Payer: Self-pay | Admitting: Oncology

## 2017-08-10 VITALS — BP 112/72 | HR 78 | Temp 97.8°F | Resp 18 | Ht 67.25 in | Wt 119.5 lb

## 2017-08-10 DIAGNOSIS — J449 Chronic obstructive pulmonary disease, unspecified: Secondary | ICD-10-CM | POA: Insufficient documentation

## 2017-08-10 DIAGNOSIS — C159 Malignant neoplasm of esophagus, unspecified: Secondary | ICD-10-CM

## 2017-08-10 DIAGNOSIS — Z5111 Encounter for antineoplastic chemotherapy: Secondary | ICD-10-CM | POA: Diagnosis not present

## 2017-08-10 DIAGNOSIS — I10 Essential (primary) hypertension: Secondary | ICD-10-CM | POA: Insufficient documentation

## 2017-08-10 DIAGNOSIS — Z510 Encounter for antineoplastic radiation therapy: Secondary | ICD-10-CM | POA: Diagnosis not present

## 2017-08-10 DIAGNOSIS — F1010 Alcohol abuse, uncomplicated: Secondary | ICD-10-CM | POA: Diagnosis not present

## 2017-08-10 DIAGNOSIS — E46 Unspecified protein-calorie malnutrition: Secondary | ICD-10-CM

## 2017-08-10 DIAGNOSIS — C155 Malignant neoplasm of lower third of esophagus: Secondary | ICD-10-CM

## 2017-08-10 DIAGNOSIS — C160 Malignant neoplasm of cardia: Secondary | ICD-10-CM | POA: Diagnosis not present

## 2017-08-10 LAB — CMP (CANCER CENTER ONLY)
ALBUMIN: 2.7 g/dL — AB (ref 3.5–5.0)
ALK PHOS: 98 U/L (ref 40–150)
ALT: 20 U/L (ref 0–55)
ANION GAP: 7 (ref 3–11)
AST: 15 U/L (ref 5–34)
BUN: 22 mg/dL (ref 7–26)
CALCIUM: 9.2 mg/dL (ref 8.4–10.4)
CO2: 39 mmol/L — AB (ref 22–29)
Chloride: 93 mmol/L — ABNORMAL LOW (ref 98–109)
Creatinine: 0.71 mg/dL (ref 0.70–1.30)
GFR, Est AFR Am: >60 mL/min (ref >60)
GFR, Estimated: >60 mL/min (ref >60)
GLUCOSE: 88 mg/dL (ref 70–140)
Potassium: 4.1 mmol/L (ref 3.5–5.1)
SODIUM: 139 mmol/L (ref 136–145)
Total Bilirubin: 0.7 mg/dL (ref 0.2–1.2)
Total Protein: 5.9 g/dL — ABNORMAL LOW (ref 6.4–8.3)

## 2017-08-10 LAB — CBC WITH DIFFERENTIAL (CANCER CENTER ONLY)
BASOS PCT: 0 %
Basophils Absolute: 0 10*3/uL (ref 0.0–0.1)
Eosinophils Absolute: 0.1 10*3/uL (ref 0.0–0.5)
Eosinophils Relative: 1 %
HEMATOCRIT: 33.9 % — AB (ref 38.4–49.9)
Hemoglobin: 11.2 g/dL — ABNORMAL LOW (ref 13.0–17.1)
Lymphocytes Relative: 3 %
Lymphs Abs: 0.3 10*3/uL — ABNORMAL LOW (ref 0.9–3.3)
MCH: 29.1 pg (ref 27.2–33.4)
MCHC: 33 g/dL (ref 32.0–36.0)
MCV: 88.4 fL (ref 79.3–98.0)
MONO ABS: 0.6 10*3/uL (ref 0.1–0.9)
MONOS PCT: 6 %
NEUTROS ABS: 8.5 10*3/uL — AB (ref 1.5–6.5)
Neutrophils Relative %: 90 %
Platelet Count: 328 10*3/uL (ref 140–400)
RBC: 3.83 MIL/uL — ABNORMAL LOW (ref 4.20–5.82)
RDW: 16.5 % — ABNORMAL HIGH (ref 11.0–14.6)
WBC Count: 9.5 10*3/uL (ref 4.0–10.3)

## 2017-08-10 MED ORDER — SODIUM CHLORIDE 0.9 % IV SOLN
20.0000 mg | Freq: Once | INTRAVENOUS | Status: AC
  Administered 2017-08-10: 20 mg via INTRAVENOUS
  Filled 2017-08-10: qty 2

## 2017-08-10 MED ORDER — SODIUM CHLORIDE 0.9 % IV SOLN
50.0000 mg/m2 | Freq: Once | INTRAVENOUS | Status: AC
  Administered 2017-08-10: 84 mg via INTRAVENOUS
  Filled 2017-08-10: qty 14

## 2017-08-10 MED ORDER — FAMOTIDINE 20 MG PO TABS
ORAL_TABLET | ORAL | Status: AC
  Filled 2017-08-10: qty 1

## 2017-08-10 MED ORDER — DEXAMETHASONE SODIUM PHOSPHATE 10 MG/ML IJ SOLN
10.0000 mg | Freq: Once | INTRAMUSCULAR | Status: AC
  Administered 2017-08-10: 10 mg via INTRAVENOUS

## 2017-08-10 MED ORDER — PALONOSETRON HCL INJECTION 0.25 MG/5ML
0.2500 mg | Freq: Once | INTRAVENOUS | Status: AC
  Administered 2017-08-10: 0.25 mg via INTRAVENOUS

## 2017-08-10 MED ORDER — PALONOSETRON HCL INJECTION 0.25 MG/5ML
INTRAVENOUS | Status: AC
  Filled 2017-08-10: qty 5

## 2017-08-10 MED ORDER — DIPHENHYDRAMINE HCL 50 MG/ML IJ SOLN
25.0000 mg | Freq: Once | INTRAMUSCULAR | Status: AC
  Administered 2017-08-10: 25 mg via INTRAVENOUS

## 2017-08-10 MED ORDER — DIPHENHYDRAMINE HCL 50 MG/ML IJ SOLN
INTRAMUSCULAR | Status: AC
  Filled 2017-08-10: qty 1

## 2017-08-10 MED ORDER — SODIUM CHLORIDE 0.9 % IV SOLN
Freq: Once | INTRAVENOUS | Status: AC
  Administered 2017-08-10: 14:00:00 via INTRAVENOUS

## 2017-08-10 MED ORDER — DEXAMETHASONE SODIUM PHOSPHATE 10 MG/ML IJ SOLN
INTRAMUSCULAR | Status: AC
  Filled 2017-08-10: qty 1

## 2017-08-10 MED ORDER — CARBOPLATIN CHEMO INJECTION 450 MG/45ML
167.6000 mg | Freq: Once | INTRAVENOUS | Status: AC
  Administered 2017-08-10: 170 mg via INTRAVENOUS
  Filled 2017-08-10: qty 17

## 2017-08-10 MED ORDER — SODIUM CHLORIDE 0.9 % IV SOLN: 10.0000 mg | Freq: Once | INTRAVENOUS | Status: DC

## 2017-08-10 MED ORDER — FAMOTIDINE IN NACL 20-0.9 MG/50ML-% IV SOLN: 20.0000 mg | Freq: Once | INTRAVENOUS | Status: DC

## 2017-08-10 NOTE — Telephone Encounter (Signed)
Scheduled appt per 2/13 los - pt to get an updated schedule in the treatment area.

## 2017-08-10 NOTE — Patient Instructions (Signed)
[facility] Discharge Instructions for Patients Receiving Chemotherapy  Today you received the following chemotherapy agents Taxol and carboplatin  To help prevent nausea and vomiting after your treatment, we encourage you to take your nausea medication as directed  If you develop nausea and vomiting that is not controlled by your nausea medication, call the clinic.   BELOW ARE SYMPTOMS THAT SHOULD BE REPORTED IMMEDIATELY:  *FEVER GREATER THAN 100.5 F  *CHILLS WITH OR WITHOUT FEVER  NAUSEA AND VOMITING THAT IS NOT CONTROLLED WITH YOUR NAUSEA MEDICATION  *UNUSUAL SHORTNESS OF BREATH  *UNUSUAL BRUISING OR BLEEDING  TENDERNESS IN MOUTH AND THROAT WITH OR WITHOUT PRESENCE OF ULCERS  *URINARY PROBLEMS  *BOWEL PROBLEMS  UNUSUAL RASH Items with * indicate a potential emergency and should be followed up as soon as possible.  Feel free to call the clinic should you have any questions or concerns. The clinic phone number is [phone].  Please show the CHEMO ALERT CARD at check-in to the Emergency Department and triage nurse.      Carboplatin injection What is this medicine? CARBOPLATIN (KAR boe pla tin) is a chemotherapy drug. It targets fast dividing cells, like cancer cells, and causes these cells to die. This medicine is used to treat ovarian cancer and many other cancers. This medicine may be used for other purposes; ask your health care provider or pharmacist if you have questions. COMMON BRAND NAME(S): Paraplatin What should I tell my health care provider before I take this medicine? They need to know if you have any of these conditions: -blood disorders -hearing problems -kidney disease -recent or ongoing radiation therapy -an unusual or allergic reaction to carboplatin, cisplatin, other chemotherapy, other medicines, foods, dyes, or preservatives -pregnant or trying to get pregnant -breast-feeding How should I use this medicine? This drug is  usually given as an infusion into a vein. It is administered in a hospital or clinic by a specially trained health care professional. Talk to your pediatrician regarding the use of this medicine in children. Special care may be needed. Overdosage: If you think you have taken too much of this medicine contact a poison control center or emergency room at once. NOTE: This medicine is only for you. Do not share this medicine with others. What if I miss a dose? It is important not to miss a dose. Call your doctor or health care professional if you are unable to keep an appointment. What may interact with this medicine? -medicines for seizures -medicines to increase blood counts like filgrastim, pegfilgrastim, sargramostim -some antibiotics like amikacin, gentamicin, neomycin, streptomycin, tobramycin -vaccines Talk to your doctor or health care professional before taking any of these medicines: -acetaminophen -aspirin -ibuprofen -ketoprofen -naproxen This list may not describe all possible interactions. Give your health care provider a list of all the medicines, herbs, non-prescription drugs, or dietary supplements you use. Also tell them if you smoke, drink alcohol, or use illegal drugs. Some items may interact with your medicine. What should I watch for while using this medicine? Your condition will be monitored carefully while you are receiving this medicine. You will need important blood work done while you are taking this medicine. This drug may make you feel generally unwell. This is not uncommon, as chemotherapy can affect healthy cells as well as cancer cells. Report any side effects. Continue your course of treatment even though you feel ill unless your doctor tells you to stop. In some cases, you may be given additional medicines to help   with side effects. Follow all directions for their use. Call your doctor or health care professional for advice if you get a fever, chills or sore throat,  or other symptoms of a cold or flu. Do not treat yourself. This drug decreases your body's ability to fight infections. Try to avoid being around people who are sick. This medicine may increase your risk to bruise or bleed. Call your doctor or health care professional if you notice any unusual bleeding. Be careful brushing and flossing your teeth or using a toothpick because you may get an infection or bleed more easily. If you have any dental work done, tell your dentist you are receiving this medicine. Avoid taking products that contain aspirin, acetaminophen, ibuprofen, naproxen, or ketoprofen unless instructed by your doctor. These medicines may hide a fever. Do not become pregnant while taking this medicine. Women should inform their doctor if they wish to become pregnant or think they might be pregnant. There is a potential for serious side effects to an unborn child. Talk to your health care professional or pharmacist for more information. Do not breast-feed an infant while taking this medicine. What side effects may I notice from receiving this medicine? Side effects that you should report to your doctor or health care professional as soon as possible: -allergic reactions like skin rash, itching or hives, swelling of the face, lips, or tongue -signs of infection - fever or chills, cough, sore throat, pain or difficulty passing urine -signs of decreased platelets or bleeding - bruising, pinpoint red spots on the skin, black, tarry stools, nosebleeds -signs of decreased red blood cells - unusually weak or tired, fainting spells, lightheadedness -breathing problems -changes in hearing -changes in vision -chest pain -high blood pressure -low blood counts - This drug may decrease the number of white blood cells, red blood cells and platelets. You may be at increased risk for infections and bleeding. -nausea and vomiting -pain, swelling, redness or irritation at the injection site -pain,  tingling, numbness in the hands or feet -problems with balance, talking, walking -trouble passing urine or change in the amount of urine Side effects that usually do not require medical attention (report to your doctor or health care professional if they continue or are bothersome): -hair loss -loss of appetite -metallic taste in the mouth or changes in taste This list may not describe all possible side effects. Call your doctor for medical advice about side effects. You may report side effects to FDA at 1-800-FDA-1088. Where should I keep my medicine? This drug is given in a hospital or clinic and will not be stored at home. NOTE: This sheet is a summary. It may not cover all possible information. If you have questions about this medicine, talk to your doctor, pharmacist, or health care provider.  2018 Elsevier/Gold Standard (2007-09-19 14:38:05) Paclitaxel injection What is this medicine? PACLITAXEL (PAK li TAX el) is a chemotherapy drug. It targets fast dividing cells, like cancer cells, and causes these cells to die. This medicine is used to treat ovarian cancer, breast cancer, and other cancers. This medicine may be used for other purposes; ask your health care provider or pharmacist if you have questions. COMMON BRAND NAME(S): Onxol, Taxol What should I tell my health care provider before I take this medicine? They need to know if you have any of these conditions: -blood disorders -irregular heartbeat -infection (especially a virus infection such as chickenpox, cold sores, or herpes) -liver disease -previous or ongoing radiation therapy -an unusual or   allergic reaction to paclitaxel, alcohol, polyoxyethylated castor oil, other chemotherapy agents, other medicines, foods, dyes, or preservatives -pregnant or trying to get pregnant -breast-feeding How should I use this medicine? This drug is given as an infusion into a vein. It is administered in a hospital or clinic by a specially  trained health care professional. Talk to your pediatrician regarding the use of this medicine in children. Special care may be needed. Overdosage: If you think you have taken too much of this medicine contact a poison control center or emergency room at once. NOTE: This medicine is only for you. Do not share this medicine with others. What if I miss a dose? It is important not to miss your dose. Call your doctor or health care professional if you are unable to keep an appointment. What may interact with this medicine? Do not take this medicine with any of the following medications: -disulfiram -metronidazole This medicine may also interact with the following medications: -cyclosporine -diazepam -ketoconazole -medicines to increase blood counts like filgrastim, pegfilgrastim, sargramostim -other chemotherapy drugs like cisplatin, doxorubicin, epirubicin, etoposide, teniposide, vincristine -quinidine -testosterone -vaccines -verapamil Talk to your doctor or health care professional before taking any of these medicines: -acetaminophen -aspirin -ibuprofen -ketoprofen -naproxen This list may not describe all possible interactions. Give your health care provider a list of all the medicines, herbs, non-prescription drugs, or dietary supplements you use. Also tell them if you smoke, drink alcohol, or use illegal drugs. Some items may interact with your medicine. What should I watch for while using this medicine? Your condition will be monitored carefully while you are receiving this medicine. You will need important blood work done while you are taking this medicine. This medicine can cause serious allergic reactions. To reduce your risk you will need to take other medicine(s) before treatment with this medicine. If you experience allergic reactions like skin rash, itching or hives, swelling of the face, lips, or tongue, tell your doctor or health care professional right away. In some cases,  you may be given additional medicines to help with side effects. Follow all directions for their use. This drug may make you feel generally unwell. This is not uncommon, as chemotherapy can affect healthy cells as well as cancer cells. Report any side effects. Continue your course of treatment even though you feel ill unless your doctor tells you to stop. Call your doctor or health care professional for advice if you get a fever, chills or sore throat, or other symptoms of a cold or flu. Do not treat yourself. This drug decreases your body's ability to fight infections. Try to avoid being around people who are sick. This medicine may increase your risk to bruise or bleed. Call your doctor or health care professional if you notice any unusual bleeding. Be careful brushing and flossing your teeth or using a toothpick because you may get an infection or bleed more easily. If you have any dental work done, tell your dentist you are receiving this medicine. Avoid taking products that contain aspirin, acetaminophen, ibuprofen, naproxen, or ketoprofen unless instructed by your doctor. These medicines may hide a fever. Do not become pregnant while taking this medicine. Women should inform their doctor if they wish to become pregnant or think they might be pregnant. There is a potential for serious side effects to an unborn child. Talk to your health care professional or pharmacist for more information. Do not breast-feed an infant while taking this medicine. Men are advised not to father a child   while receiving this medicine. This product may contain alcohol. Ask your pharmacist or healthcare provider if this medicine contains alcohol. Be sure to tell all healthcare providers you are taking this medicine. Certain medicines, like metronidazole and disulfiram, can cause an unpleasant reaction when taken with alcohol. The reaction includes flushing, headache, nausea, vomiting, sweating, and increased thirst. The  reaction can last from 30 minutes to several hours. What side effects may I notice from receiving this medicine? Side effects that you should report to your doctor or health care professional as soon as possible: -allergic reactions like skin rash, itching or hives, swelling of the face, lips, or tongue -low blood counts - This drug may decrease the number of white blood cells, red blood cells and platelets. You may be at increased risk for infections and bleeding. -signs of infection - fever or chills, cough, sore throat, pain or difficulty passing urine -signs of decreased platelets or bleeding - bruising, pinpoint red spots on the skin, black, tarry stools, nosebleeds -signs of decreased red blood cells - unusually weak or tired, fainting spells, lightheadedness -breathing problems -chest pain -high or low blood pressure -mouth sores -nausea and vomiting -pain, swelling, redness or irritation at the injection site -pain, tingling, numbness in the hands or feet -slow or irregular heartbeat -swelling of the ankle, feet, hands Side effects that usually do not require medical attention (report to your doctor or health care professional if they continue or are bothersome): -bone pain -complete hair loss including hair on your head, underarms, pubic hair, eyebrows, and eyelashes -changes in the color of fingernails -diarrhea -loosening of the fingernails -loss of appetite -muscle or joint pain -red flush to skin -sweating This list may not describe all possible side effects. Call your doctor for medical advice about side effects. You may report side effects to FDA at 1-800-FDA-1088. Where should I keep my medicine? This drug is given in a hospital or clinic and will not be stored at home. NOTE: This sheet is a summary. It may not cover all possible information. If you have questions about this medicine, talk to your doctor, pharmacist, or health care provider.  2018 Elsevier/Gold  Standard (2015-04-15 19:58:00)  

## 2017-08-10 NOTE — Progress Notes (Signed)
[facility] OFFICE PROGRESS NOTE   Diagnosis: Esophagus cancer  INTERVAL HISTORY:   [name] was discharged 08/02/2017 after a prolonged admission.  He was diagnosed with an obstructing esophagus cancer.  He developed respiratory failure requiring intubation following revision of a feeding jejunostomy tube.  He was discharged to a skilled nursing facility.  He reports stable dyspnea.  He had one episode of nausea/vomiting when leaving the nursing facility today, but otherwise has no nausea.  He continues tube feedings. He completed 1 treatment with Taxol/carboplatin chemotherapy 08/01/2017.  He tolerated the chemotherapy well.  He continues daily radiation.  Objective:  Vital signs in last 24 hours:  Blood pressure 112/72, pulse 78, temperature 97.8 F (36.6 C), temperature source Oral, resp. rate 18, height 5' 7.25" (1.708 m), SpO2 95 %.    HEENT: No thrush Resp: Distant breath sounds, scattered rhonchi, no respiratory distress Cardio: Regular rate and rhythm GI: No hepatomegaly, left upper quadrant feeding tube site with mild erythema at the skin exit. Vascular: No leg edema  Skin: Early stage pressure ulcer at the mid back   Lab Results:  Lab Results  Component Value Date   WBC 9.5 08/10/2017   HGB 10.2 (L) 08/02/2017   HCT 33.9 (L) 08/10/2017   MCV 88.4 08/10/2017   PLT 328 08/10/2017   NEUTROABS 8.5 (H) 08/10/2017    CMP     Component Value Date/Time   NA 141 08/02/2017 1015   NA 143 06/29/2017 1508   K 4.6 08/02/2017 1015   CL 92 (L) 08/02/2017 1015   CO2 43 (H) 08/02/2017 1015   GLUCOSE 168 (H) 08/02/2017 1015   BUN 20 08/02/2017 1015   BUN 28 (H) 06/29/2017 1508   CREATININE 0.68 08/02/2017 1015   CREATININE 1.07 05/01/2015 1508   CALCIUM 8.2 (L) 08/02/2017 1015   PROT 4.9 (L) 08/02/2017 1015   PROT 7.3 06/29/2017 1508   ALBUMIN 2.0 (L) 08/02/2017 1015   ALBUMIN 4.5 06/29/2017 1508   AST 32 08/02/2017 1015   ALT 46 08/02/2017 1015   ALKPHOS 88 08/02/2017 1015   BILITOT 0.3 08/02/2017 1015   BILITOT 0.6 06/29/2017 1508   GFRNONAA >60 08/02/2017 1015   GFRNONAA 71 05/01/2015 1508   GFRAA >60 08/02/2017 1015   GFRAA 83 05/01/2015 1508    Lab Results  Component Value Date   CEA1 2.8 07/15/2017    Medications: I have reviewed the patient's current medications.   Assessment/Plan:  1.Esophagus cancer  Distal esophageal stricture noted on esophagram 07/11/2017  CTs of the chest, abdomen, and pelvis 07/08/2017-right lung pneumonia, emphysema, no evidence of metastatic disease  Upper endoscopy 07/13/2017- GE junction mass, could not be passed with standard endoscope, biopsy suspicious for adenocarcinoma  Right pleural fluid cytology 07/26/2017-negative  Initiation of weekly Taxol/carboplatin and radiation 08/01/2017  2.Solid/liquid dysphagia secondary to #1  Placement of jejunostomy feeding tube 07/15/2017, revised 07/20/2017  3.Weight loss/malnutrition, maintained on jejunostomy tube feedings  4.COPD  5.Heavy alcohol use  6.Gout  7.Hypertension  8.Pneumonia January 2018   Disposition: George Stokes has been diagnosed with esophagus cancer.  He is completing treatment with concurrent chemotherapy and radiation.  He will complete week 2 Taxol/carboplatin today.  He appears to be tolerating the treatment well.  He will continue jejunostomy tube feedings.  We will ask for a speech therapy evaluation at the skilled nursing facility to assess his ability to drink liquids.  He will return for chemotherapy in 1 week and an office visit in  2 weeks.  25 minutes were spent with the patient today.  The majority of the time was used for counseling and coordination of care.  Betsy Coder, MD  08/10/2017  12:26 PM

## 2017-08-10 NOTE — Patient Outreach (Signed)
[facility]) Care Management  08/10/2017  Elizer Bostic 06/18/1946 [id number]   Met with Vickii Chafe, SW at facility. She reports that patient discharge plan is to go to an ALF or remain LTC. This depends on progress at facility, at this time it would be LTC.   Plan to sign off as not [facility] community care management needs noted as patient will be LTC or ALF.  This RNCM can be re-referred to case if discharge needs change.  Royetta Crochet. Laymond Purser, RN, BSN, [address] [phone]) Business Cell  [phone]) Toll Free Office

## 2017-08-11 ENCOUNTER — Ambulatory Visit
Admission: RE | Admit: 2017-08-11 | Discharge: 2017-08-11 | Disposition: A | Discharge status: Home / Self Care | Payer: Medicare Other | Source: Ambulatory Visit | Attending: Radiation Oncology | Admitting: Radiation Oncology

## 2017-08-11 ENCOUNTER — Telehealth: Payer: Self-pay

## 2017-08-11 DIAGNOSIS — Z510 Encounter for antineoplastic radiation therapy: Secondary | ICD-10-CM | POA: Diagnosis not present

## 2017-08-11 DIAGNOSIS — R1319 Other dysphagia: Secondary | ICD-10-CM | POA: Diagnosis not present

## 2017-08-11 DIAGNOSIS — J168 Pneumonia due to other specified infectious organisms: Secondary | ICD-10-CM | POA: Diagnosis not present

## 2017-08-11 DIAGNOSIS — J449 Chronic obstructive pulmonary disease, unspecified: Secondary | ICD-10-CM | POA: Diagnosis not present

## 2017-08-11 DIAGNOSIS — C159 Malignant neoplasm of esophagus, unspecified: Secondary | ICD-10-CM | POA: Diagnosis not present

## 2017-08-11 DIAGNOSIS — C160 Malignant neoplasm of cardia: Secondary | ICD-10-CM | POA: Diagnosis not present

## 2017-08-11 DIAGNOSIS — C155 Malignant neoplasm of lower third of esophagus: Secondary | ICD-10-CM | POA: Diagnosis not present

## 2017-08-11 NOTE — Telephone Encounter (Signed)
Spoke with Deana in the therapy dept to inform of need for speech therapy evaluation per Dr. Benay Spice. Deana voiced understanding and "will make sure this gets taken care of".

## 2017-08-12 ENCOUNTER — Telehealth: Payer: Self-pay | Admitting: Radiation Oncology

## 2017-08-12 ENCOUNTER — Other Ambulatory Visit (HOSPITAL_COMMUNITY): Payer: Self-pay | Admitting: Internal Medicine

## 2017-08-12 ENCOUNTER — Ambulatory Visit: Payer: Medicare Other

## 2017-08-12 DIAGNOSIS — R1319 Other dysphagia: Secondary | ICD-10-CM

## 2017-08-12 NOTE — Telephone Encounter (Signed)
Received call from [place] at [facility]. He reports the patient refuses to come for radiation treatment today. He states, "he is exhausted." Informed Jehnna, RT on L4 of this finding. Lennette Bihari understands patient's missed appointment today will be tacked onto the end.

## 2017-08-14 ENCOUNTER — Other Ambulatory Visit: Payer: Self-pay | Admitting: Oncology

## 2017-08-15 ENCOUNTER — Ambulatory Visit (HOSPITAL_COMMUNITY): Payer: Medicare Other

## 2017-08-15 ENCOUNTER — Ambulatory Visit
Admission: RE | Admit: 2017-08-15 | Discharge: 2017-08-15 | Disposition: A | Discharge status: Home / Self Care | Payer: Medicare Other | Source: Ambulatory Visit | Attending: Radiation Oncology | Admitting: Radiation Oncology

## 2017-08-15 DIAGNOSIS — Z510 Encounter for antineoplastic radiation therapy: Secondary | ICD-10-CM | POA: Diagnosis not present

## 2017-08-15 DIAGNOSIS — C155 Malignant neoplasm of lower third of esophagus: Secondary | ICD-10-CM | POA: Diagnosis not present

## 2017-08-15 DIAGNOSIS — C160 Malignant neoplasm of cardia: Secondary | ICD-10-CM | POA: Diagnosis not present

## 2017-08-16 ENCOUNTER — Ambulatory Visit
Admission: RE | Admit: 2017-08-16 | Discharge: 2017-08-16 | Disposition: A | Discharge status: Home / Self Care | Payer: Medicare Other | Source: Ambulatory Visit | Attending: Radiation Oncology | Admitting: Radiation Oncology

## 2017-08-16 ENCOUNTER — Ambulatory Visit (HOSPITAL_COMMUNITY)
Admission: RE | Admit: 2017-08-16 | Discharge: 2017-08-16 | Disposition: A | Discharge status: Home / Self Care | Payer: Medicare Other | Source: Ambulatory Visit | Attending: Internal Medicine | Admitting: Internal Medicine

## 2017-08-16 ENCOUNTER — Encounter (HOSPITAL_COMMUNITY): Payer: Self-pay | Admitting: Interventional Radiology

## 2017-08-16 ENCOUNTER — Other Ambulatory Visit (HOSPITAL_COMMUNITY): Payer: Self-pay | Admitting: Internal Medicine

## 2017-08-16 DIAGNOSIS — R633 Feeding difficulties, unspecified: Secondary | ICD-10-CM

## 2017-08-16 DIAGNOSIS — Z510 Encounter for antineoplastic radiation therapy: Secondary | ICD-10-CM | POA: Diagnosis not present

## 2017-08-16 DIAGNOSIS — Z434 Encounter for attention to other artificial openings of digestive tract: Secondary | ICD-10-CM | POA: Diagnosis not present

## 2017-08-16 DIAGNOSIS — C155 Malignant neoplasm of lower third of esophagus: Secondary | ICD-10-CM | POA: Diagnosis not present

## 2017-08-16 DIAGNOSIS — C160 Malignant neoplasm of cardia: Secondary | ICD-10-CM | POA: Diagnosis not present

## 2017-08-16 HISTORY — PX: IR REPLC DUODEN/JEJUNO TUBE PERCUT W/FLUORO: IMG2334

## 2017-08-16 MED ORDER — IOPAMIDOL (ISOVUE-300) INJECTION 61%
INTRAVENOUS | Status: AC
  Filled 2017-08-16: qty 50

## 2017-08-16 NOTE — Procedures (Signed)
Fluoro replacement of the Jtube  Ready for use Full report in pacs

## 2017-08-17 ENCOUNTER — Telehealth: Payer: Self-pay

## 2017-08-17 ENCOUNTER — Ambulatory Visit: Payer: Medicare Other

## 2017-08-17 ENCOUNTER — Telehealth: Payer: Self-pay | Admitting: Radiation Oncology

## 2017-08-17 ENCOUNTER — Other Ambulatory Visit: Payer: Medicare Other

## 2017-08-17 ENCOUNTER — Ambulatory Visit
Admission: RE | Admit: 2017-08-17 | Discharge: 2017-08-17 | Disposition: A | Discharge status: Home / Self Care | Payer: Medicare Other | Source: Ambulatory Visit | Attending: Radiation Oncology | Admitting: Radiation Oncology

## 2017-08-17 DIAGNOSIS — C155 Malignant neoplasm of lower third of esophagus: Secondary | ICD-10-CM | POA: Diagnosis not present

## 2017-08-17 DIAGNOSIS — L89150 Pressure ulcer of sacral region, unstageable: Secondary | ICD-10-CM | POA: Diagnosis not present

## 2017-08-17 DIAGNOSIS — C160 Malignant neoplasm of cardia: Secondary | ICD-10-CM | POA: Diagnosis not present

## 2017-08-17 DIAGNOSIS — Z510 Encounter for antineoplastic radiation therapy: Secondary | ICD-10-CM | POA: Diagnosis not present

## 2017-08-17 NOTE — Telephone Encounter (Signed)
Spoke with Lennette Bihari regarding pt appts for tomorrow. Pt to come for lab at 8am and infusion at 8:30am. Transporter voiced understanding. "may be a little late but i'll try my best to get him there at 8am. I have another pt that has to be in [place] at 7:30." This RN voiced understanding. Will notify schedulers

## 2017-08-17 NOTE — Telephone Encounter (Signed)
Informed by Elmyra Ricks, RT on L4, that patient did not show for radiation treatment. Upon investigation noted that patient didn't show for lab work nor chemo infusion. Phoned Lennette Bihari @ [facility] to inquire. Lennette Bihari reports the staff was unaware of the patient's lab and chemo appointment for today but that arrangements have been made for the patient to be at radiation by 2:30 pm for treatment. Informed Nicole, RT on L4. Phoned Dr. Gearldine Shown desk nurse, Bettey Mare, at [phone] and informed her of the missed chemo appointment. Also, phoned Laurence Compton charge nurse in the infusion room today and informed her of the same.

## 2017-08-18 ENCOUNTER — Inpatient Hospital Stay: Payer: Medicare Other

## 2017-08-18 ENCOUNTER — Ambulatory Visit (HOSPITAL_COMMUNITY)
Admission: RE | Admit: 2017-08-18 | Discharge: 2017-08-18 | Disposition: A | Discharge status: Home / Self Care | Payer: Medicare Other | Source: Ambulatory Visit | Attending: Internal Medicine | Admitting: Internal Medicine

## 2017-08-18 ENCOUNTER — Ambulatory Visit
Admission: RE | Admit: 2017-08-18 | Discharge: 2017-08-18 | Disposition: A | Discharge status: Home / Self Care | Payer: Medicare Other | Source: Ambulatory Visit | Attending: Radiation Oncology | Admitting: Radiation Oncology

## 2017-08-18 ENCOUNTER — Ambulatory Visit (HOSPITAL_COMMUNITY): Admission: RE | Admit: 2017-08-18 | Payer: Medicare Other | Source: Ambulatory Visit

## 2017-08-18 VITALS — BP 110/62 | HR 72 | Temp 98.1°F | Resp 19

## 2017-08-18 DIAGNOSIS — C159 Malignant neoplasm of esophagus, unspecified: Secondary | ICD-10-CM

## 2017-08-18 DIAGNOSIS — Z510 Encounter for antineoplastic radiation therapy: Secondary | ICD-10-CM | POA: Diagnosis not present

## 2017-08-18 DIAGNOSIS — C155 Malignant neoplasm of lower third of esophagus: Secondary | ICD-10-CM | POA: Diagnosis not present

## 2017-08-18 DIAGNOSIS — C160 Malignant neoplasm of cardia: Secondary | ICD-10-CM | POA: Diagnosis not present

## 2017-08-18 DIAGNOSIS — Z5111 Encounter for antineoplastic chemotherapy: Secondary | ICD-10-CM | POA: Diagnosis not present

## 2017-08-18 LAB — CBC WITH DIFFERENTIAL (CANCER CENTER ONLY)
BASOS ABS: 0 10*3/uL (ref 0.0–0.1)
BASOS PCT: 0 %
Eosinophils Absolute: 0 10*3/uL (ref 0.0–0.5)
Eosinophils Relative: 0 %
HEMATOCRIT: 26.3 % — AB (ref 38.4–49.9)
HEMOGLOBIN: 8.5 g/dL — AB (ref 13.0–17.1)
Lymphocytes Relative: 6 %
Lymphs Abs: 0.4 10*3/uL — ABNORMAL LOW (ref 0.9–3.3)
MCH: 29.6 pg (ref 27.2–33.4)
MCHC: 32.3 g/dL (ref 32.0–36.0)
MCV: 91.6 fL (ref 79.3–98.0)
MONOS PCT: 12 %
Monocytes Absolute: 0.7 10*3/uL (ref 0.1–0.9)
NEUTROS ABS: 4.9 10*3/uL (ref 1.5–6.5)
NEUTROS PCT: 82 %
Platelet Count: 338 10*3/uL (ref 140–400)
RBC: 2.87 MIL/uL — ABNORMAL LOW (ref 4.20–5.82)
RDW: 16 % — ABNORMAL HIGH (ref 11.0–14.6)
WBC Count: 6.1 10*3/uL (ref 4.0–10.3)

## 2017-08-18 MED ORDER — DIPHENHYDRAMINE HCL 50 MG/ML IJ SOLN
INTRAMUSCULAR | Status: AC
  Filled 2017-08-18: qty 1

## 2017-08-18 MED ORDER — DEXAMETHASONE SODIUM PHOSPHATE 10 MG/ML IJ SOLN
INTRAMUSCULAR | Status: AC
  Filled 2017-08-18: qty 1

## 2017-08-18 MED ORDER — SODIUM CHLORIDE 0.9 % IV SOLN
Freq: Once | INTRAVENOUS | Status: AC
  Administered 2017-08-18: 10:00:00 via INTRAVENOUS

## 2017-08-18 MED ORDER — PALONOSETRON HCL INJECTION 0.25 MG/5ML
INTRAVENOUS | Status: AC
Start: 2017-08-18 — End: 2017-08-18
  Filled 2017-08-18: qty 5

## 2017-08-18 MED ORDER — SODIUM CHLORIDE 0.9 % IV SOLN
50.0000 mg/m2 | Freq: Once | INTRAVENOUS | Status: AC
  Administered 2017-08-18: 84 mg via INTRAVENOUS
  Filled 2017-08-18: qty 14

## 2017-08-18 MED ORDER — DIPHENHYDRAMINE HCL 50 MG/ML IJ SOLN
25.0000 mg | Freq: Once | INTRAMUSCULAR | Status: AC
  Administered 2017-08-18: 25 mg via INTRAVENOUS

## 2017-08-18 MED ORDER — FAMOTIDINE IN NACL 20-0.9 MG/50ML-% IV SOLN: 20.0000 mg | Freq: Once | INTRAVENOUS | Status: DC

## 2017-08-18 MED ORDER — DEXAMETHASONE SODIUM PHOSPHATE 10 MG/ML IJ SOLN
10.0000 mg | Freq: Once | INTRAMUSCULAR | Status: AC
  Administered 2017-08-18: 10 mg via INTRAVENOUS

## 2017-08-18 MED ORDER — PALONOSETRON HCL INJECTION 0.25 MG/5ML
0.2500 mg | Freq: Once | INTRAVENOUS | Status: AC
  Administered 2017-08-18: 0.25 mg via INTRAVENOUS

## 2017-08-18 MED ORDER — CARBOPLATIN CHEMO INJECTION 450 MG/45ML
167.6000 mg | Freq: Once | INTRAVENOUS | Status: AC
  Administered 2017-08-18: 170 mg via INTRAVENOUS
  Filled 2017-08-18: qty 17

## 2017-08-18 MED ORDER — SODIUM CHLORIDE 0.9 % IV SOLN
20.0000 mg | Freq: Once | INTRAVENOUS | Status: AC
  Administered 2017-08-18: 20 mg via INTRAVENOUS
  Filled 2017-08-18: qty 2

## 2017-08-18 NOTE — Progress Notes (Signed)
Per Dr. Benay Spice, ok to treat with only a CBC.

## 2017-08-18 NOTE — Patient Instructions (Signed)
[facility] Discharge Instructions for Patients Receiving Chemotherapy  Today you received the following chemotherapy agents Taxol and carboplatin  To help prevent nausea and vomiting after your treatment, we encourage you to take your nausea medication as directed  If you develop nausea and vomiting that is not controlled by your nausea medication, call the clinic.   BELOW ARE SYMPTOMS THAT SHOULD BE REPORTED IMMEDIATELY:  *FEVER GREATER THAN 100.5 F  *CHILLS WITH OR WITHOUT FEVER  NAUSEA AND VOMITING THAT IS NOT CONTROLLED WITH YOUR NAUSEA MEDICATION  *UNUSUAL SHORTNESS OF BREATH  *UNUSUAL BRUISING OR BLEEDING  TENDERNESS IN MOUTH AND THROAT WITH OR WITHOUT PRESENCE OF ULCERS  *URINARY PROBLEMS  *BOWEL PROBLEMS  UNUSUAL RASH Items with * indicate a potential emergency and should be followed up as soon as possible.  Feel free to call the clinic should you have any questions or concerns. The clinic phone number is [phone].  Please show the CHEMO ALERT CARD at check-in to the Emergency Department and triage nurse.      Carboplatin injection What is this medicine? CARBOPLATIN (KAR boe pla tin) is a chemotherapy drug. It targets fast dividing cells, like cancer cells, and causes these cells to die. This medicine is used to treat ovarian cancer and many other cancers. This medicine may be used for other purposes; ask your health care provider or pharmacist if you have questions. COMMON BRAND NAME(S): Paraplatin What should I tell my health care provider before I take this medicine? They need to know if you have any of these conditions: -blood disorders -hearing problems -kidney disease -recent or ongoing radiation therapy -an unusual or allergic reaction to carboplatin, cisplatin, other chemotherapy, other medicines, foods, dyes, or preservatives -pregnant or trying to get pregnant -breast-feeding How should I use this medicine? This drug is  usually given as an infusion into a vein. It is administered in a hospital or clinic by a specially trained health care professional. Talk to your pediatrician regarding the use of this medicine in children. Special care may be needed. Overdosage: If you think you have taken too much of this medicine contact a poison control center or emergency room at once. NOTE: This medicine is only for you. Do not share this medicine with others. What if I miss a dose? It is important not to miss a dose. Call your doctor or health care professional if you are unable to keep an appointment. What may interact with this medicine? -medicines for seizures -medicines to increase blood counts like filgrastim, pegfilgrastim, sargramostim -some antibiotics like amikacin, gentamicin, neomycin, streptomycin, tobramycin -vaccines Talk to your doctor or health care professional before taking any of these medicines: -acetaminophen -aspirin -ibuprofen -ketoprofen -naproxen This list may not describe all possible interactions. Give your health care provider a list of all the medicines, herbs, non-prescription drugs, or dietary supplements you use. Also tell them if you smoke, drink alcohol, or use illegal drugs. Some items may interact with your medicine. What should I watch for while using this medicine? Your condition will be monitored carefully while you are receiving this medicine. You will need important blood work done while you are taking this medicine. This drug may make you feel generally unwell. This is not uncommon, as chemotherapy can affect healthy cells as well as cancer cells. Report any side effects. Continue your course of treatment even though you feel ill unless your doctor tells you to stop. In some cases, you may be given additional medicines to help   with side effects. Follow all directions for their use. Call your doctor or health care professional for advice if you get a fever, chills or sore throat,  or other symptoms of a cold or flu. Do not treat yourself. This drug decreases your body's ability to fight infections. Try to avoid being around people who are sick. This medicine may increase your risk to bruise or bleed. Call your doctor or health care professional if you notice any unusual bleeding. Be careful brushing and flossing your teeth or using a toothpick because you may get an infection or bleed more easily. If you have any dental work done, tell your dentist you are receiving this medicine. Avoid taking products that contain aspirin, acetaminophen, ibuprofen, naproxen, or ketoprofen unless instructed by your doctor. These medicines may hide a fever. Do not become pregnant while taking this medicine. Women should inform their doctor if they wish to become pregnant or think they might be pregnant. There is a potential for serious side effects to an unborn child. Talk to your health care professional or pharmacist for more information. Do not breast-feed an infant while taking this medicine. What side effects may I notice from receiving this medicine? Side effects that you should report to your doctor or health care professional as soon as possible: -allergic reactions like skin rash, itching or hives, swelling of the face, lips, or tongue -signs of infection - fever or chills, cough, sore throat, pain or difficulty passing urine -signs of decreased platelets or bleeding - bruising, pinpoint red spots on the skin, black, tarry stools, nosebleeds -signs of decreased red blood cells - unusually weak or tired, fainting spells, lightheadedness -breathing problems -changes in hearing -changes in vision -chest pain -high blood pressure -low blood counts - This drug may decrease the number of white blood cells, red blood cells and platelets. You may be at increased risk for infections and bleeding. -nausea and vomiting -pain, swelling, redness or irritation at the injection site -pain,  tingling, numbness in the hands or feet -problems with balance, talking, walking -trouble passing urine or change in the amount of urine Side effects that usually do not require medical attention (report to your doctor or health care professional if they continue or are bothersome): -hair loss -loss of appetite -metallic taste in the mouth or changes in taste This list may not describe all possible side effects. Call your doctor for medical advice about side effects. You may report side effects to FDA at 1-800-FDA-1088. Where should I keep my medicine? This drug is given in a hospital or clinic and will not be stored at home. NOTE: This sheet is a summary. It may not cover all possible information. If you have questions about this medicine, talk to your doctor, pharmacist, or health care provider.  2018 Elsevier/Gold Standard (2007-09-19 14:38:05) Paclitaxel injection What is this medicine? PACLITAXEL (PAK li TAX el) is a chemotherapy drug. It targets fast dividing cells, like cancer cells, and causes these cells to die. This medicine is used to treat ovarian cancer, breast cancer, and other cancers. This medicine may be used for other purposes; ask your health care provider or pharmacist if you have questions. COMMON BRAND NAME(S): Onxol, Taxol What should I tell my health care provider before I take this medicine? They need to know if you have any of these conditions: -blood disorders -irregular heartbeat -infection (especially a virus infection such as chickenpox, cold sores, or herpes) -liver disease -previous or ongoing radiation therapy -an unusual or   allergic reaction to paclitaxel, alcohol, polyoxyethylated castor oil, other chemotherapy agents, other medicines, foods, dyes, or preservatives -pregnant or trying to get pregnant -breast-feeding How should I use this medicine? This drug is given as an infusion into a vein. It is administered in a hospital or clinic by a specially  trained health care professional. Talk to your pediatrician regarding the use of this medicine in children. Special care may be needed. Overdosage: If you think you have taken too much of this medicine contact a poison control center or emergency room at once. NOTE: This medicine is only for you. Do not share this medicine with others. What if I miss a dose? It is important not to miss your dose. Call your doctor or health care professional if you are unable to keep an appointment. What may interact with this medicine? Do not take this medicine with any of the following medications: -disulfiram -metronidazole This medicine may also interact with the following medications: -cyclosporine -diazepam -ketoconazole -medicines to increase blood counts like filgrastim, pegfilgrastim, sargramostim -other chemotherapy drugs like cisplatin, doxorubicin, epirubicin, etoposide, teniposide, vincristine -quinidine -testosterone -vaccines -verapamil Talk to your doctor or health care professional before taking any of these medicines: -acetaminophen -aspirin -ibuprofen -ketoprofen -naproxen This list may not describe all possible interactions. Give your health care provider a list of all the medicines, herbs, non-prescription drugs, or dietary supplements you use. Also tell them if you smoke, drink alcohol, or use illegal drugs. Some items may interact with your medicine. What should I watch for while using this medicine? Your condition will be monitored carefully while you are receiving this medicine. You will need important blood work done while you are taking this medicine. This medicine can cause serious allergic reactions. To reduce your risk you will need to take other medicine(s) before treatment with this medicine. If you experience allergic reactions like skin rash, itching or hives, swelling of the face, lips, or tongue, tell your doctor or health care professional right away. In some cases,  you may be given additional medicines to help with side effects. Follow all directions for their use. This drug may make you feel generally unwell. This is not uncommon, as chemotherapy can affect healthy cells as well as cancer cells. Report any side effects. Continue your course of treatment even though you feel ill unless your doctor tells you to stop. Call your doctor or health care professional for advice if you get a fever, chills or sore throat, or other symptoms of a cold or flu. Do not treat yourself. This drug decreases your body's ability to fight infections. Try to avoid being around people who are sick. This medicine may increase your risk to bruise or bleed. Call your doctor or health care professional if you notice any unusual bleeding. Be careful brushing and flossing your teeth or using a toothpick because you may get an infection or bleed more easily. If you have any dental work done, tell your dentist you are receiving this medicine. Avoid taking products that contain aspirin, acetaminophen, ibuprofen, naproxen, or ketoprofen unless instructed by your doctor. These medicines may hide a fever. Do not become pregnant while taking this medicine. Women should inform their doctor if they wish to become pregnant or think they might be pregnant. There is a potential for serious side effects to an unborn child. Talk to your health care professional or pharmacist for more information. Do not breast-feed an infant while taking this medicine. Men are advised not to father a child   while receiving this medicine. This product may contain alcohol. Ask your pharmacist or healthcare provider if this medicine contains alcohol. Be sure to tell all healthcare providers you are taking this medicine. Certain medicines, like metronidazole and disulfiram, can cause an unpleasant reaction when taken with alcohol. The reaction includes flushing, headache, nausea, vomiting, sweating, and increased thirst. The  reaction can last from 30 minutes to several hours. What side effects may I notice from receiving this medicine? Side effects that you should report to your doctor or health care professional as soon as possible: -allergic reactions like skin rash, itching or hives, swelling of the face, lips, or tongue -low blood counts - This drug may decrease the number of white blood cells, red blood cells and platelets. You may be at increased risk for infections and bleeding. -signs of infection - fever or chills, cough, sore throat, pain or difficulty passing urine -signs of decreased platelets or bleeding - bruising, pinpoint red spots on the skin, black, tarry stools, nosebleeds -signs of decreased red blood cells - unusually weak or tired, fainting spells, lightheadedness -breathing problems -chest pain -high or low blood pressure -mouth sores -nausea and vomiting -pain, swelling, redness or irritation at the injection site -pain, tingling, numbness in the hands or feet -slow or irregular heartbeat -swelling of the ankle, feet, hands Side effects that usually do not require medical attention (report to your doctor or health care professional if they continue or are bothersome): -bone pain -complete hair loss including hair on your head, underarms, pubic hair, eyebrows, and eyelashes -changes in the color of fingernails -diarrhea -loosening of the fingernails -loss of appetite -muscle or joint pain -red flush to skin -sweating This list may not describe all possible side effects. Call your doctor for medical advice about side effects. You may report side effects to FDA at 1-800-FDA-1088. Where should I keep my medicine? This drug is given in a hospital or clinic and will not be stored at home. NOTE: This sheet is a summary. It may not cover all possible information. If you have questions about this medicine, talk to your doctor, pharmacist, or health care provider.  2018 Elsevier/Gold  Standard (2015-04-15 19:58:00)  

## 2017-08-19 ENCOUNTER — Ambulatory Visit
Admission: RE | Admit: 2017-08-19 | Discharge: 2017-08-19 | Disposition: A | Discharge status: Home / Self Care | Payer: Medicare Other | Source: Ambulatory Visit | Attending: Radiation Oncology | Admitting: Radiation Oncology

## 2017-08-19 ENCOUNTER — Ambulatory Visit: Payer: Medicare Other

## 2017-08-19 ENCOUNTER — Other Ambulatory Visit: Payer: Self-pay | Admitting: *Deleted

## 2017-08-19 ENCOUNTER — Inpatient Hospital Stay: Payer: Medicare Other

## 2017-08-19 ENCOUNTER — Telehealth: Payer: Self-pay | Admitting: *Deleted

## 2017-08-19 DIAGNOSIS — C159 Malignant neoplasm of esophagus, unspecified: Secondary | ICD-10-CM | POA: Diagnosis not present

## 2017-08-19 DIAGNOSIS — Z5111 Encounter for antineoplastic chemotherapy: Secondary | ICD-10-CM | POA: Diagnosis not present

## 2017-08-19 DIAGNOSIS — K222 Esophageal obstruction: Secondary | ICD-10-CM | POA: Diagnosis not present

## 2017-08-19 DIAGNOSIS — J449 Chronic obstructive pulmonary disease, unspecified: Secondary | ICD-10-CM | POA: Diagnosis not present

## 2017-08-19 DIAGNOSIS — I10 Essential (primary) hypertension: Secondary | ICD-10-CM | POA: Diagnosis not present

## 2017-08-19 DIAGNOSIS — Z510 Encounter for antineoplastic radiation therapy: Secondary | ICD-10-CM | POA: Diagnosis not present

## 2017-08-19 DIAGNOSIS — C155 Malignant neoplasm of lower third of esophagus: Secondary | ICD-10-CM | POA: Diagnosis not present

## 2017-08-19 DIAGNOSIS — C160 Malignant neoplasm of cardia: Secondary | ICD-10-CM | POA: Diagnosis not present

## 2017-08-19 LAB — CBC WITH DIFFERENTIAL (CANCER CENTER ONLY)
Basophils Absolute: 0 10*3/uL (ref 0.0–0.1)
Basophils Relative: 0 %
EOS PCT: 0 %
Eosinophils Absolute: 0 10*3/uL (ref 0.0–0.5)
HEMATOCRIT: 28.1 % — AB (ref 38.4–49.9)
Hemoglobin: 9.1 g/dL — ABNORMAL LOW (ref 13.0–17.1)
LYMPHS PCT: 9 %
Lymphs Abs: 0.4 10*3/uL — ABNORMAL LOW (ref 0.9–3.3)
MCH: 29.8 pg (ref 27.2–33.4)
MCHC: 32.4 g/dL (ref 32.0–36.0)
MCV: 92.1 fL (ref 79.3–98.0)
MONO ABS: 0.4 10*3/uL (ref 0.1–0.9)
MONOS PCT: 9 %
NEUTROS ABS: 4 10*3/uL (ref 1.5–6.5)
Neutrophils Relative %: 82 %
PLATELETS: 397 10*3/uL (ref 140–400)
RBC: 3.05 MIL/uL — ABNORMAL LOW (ref 4.20–5.82)
RDW: 16.4 % — AB (ref 11.0–14.6)
WBC Count: 4.8 10*3/uL (ref 4.0–10.3)

## 2017-08-19 LAB — SAMPLE TO BLOOD BANK

## 2017-08-19 NOTE — Telephone Encounter (Signed)
MD noted drop in HGB on labs from 2/21. Order received for CBC and hold blood bank today. Left message for Aldona Bar in RadOnc to assess patient for bleeding during treatment today. Received confirmation voicemail from [facility] that the radonc team will assess pt. HGB 9.1 today.

## 2017-08-19 NOTE — Progress Notes (Signed)
Radiation Oncology         [phone] ________________________________  Name: George Stokes MRN: [id number]  Date: 07/28/2017  DOB: May 22, 1946  SIMULATION AND TREATMENT PLANNING NOTE  DIAGNOSIS:     ICD-10-CM   1. Malignant neoplasm of lower third of esophagus (HCC) C15.5      Site:  esophagus  NARRATIVE:  The patient was brought to the CT Simulation planning suite.  Identity was confirmed.  All relevant records and images related to the planned course of therapy were reviewed.   Written consent to proceed with treatment was confirmed which was freely given after reviewing the details related to the planned course of therapy had been reviewed with the patient.  Then, the patient was set-up in a stable reproducible  supine position for radiation therapy.  CT images were obtained.  Surface markings were placed.    Medically necessary complex treatment device(s) for immobilization:  Customized vac lock bag.   The CT images were loaded into the planning software.  Then the target and avoidance structures were contoured.  Treatment planning then occurred.  The radiation prescription was entered and confirmed. The patient will be treated using a 3-D conformal technique on our tomotherapy unit. A total of 7 complex treatment devices were fabricated which relate to the designed radiation treatment fields. Each of these customized fields/ complex treatment devices will be used on a daily basis during the radiation course. I have requested : 3D Simulation  I have requested a DVH of the following structures: Target volume, spinal cord, heart, lungs.   The patient will undergo daily image guidance to ensure accurate localization of the target, and adequate minimize dose to the normal surrounding structures in close proximity to the target.  PLAN:  The patient will receive 45 Gy in 25 fractions initially. The patient will then receive a 5.4 gray boost for a total dose of 50.4 gray.  Special treatment  procedure The patient will also receive concurrent chemotherapy during the treatment. The patient may therefore experience increased toxicity or side effects and the patient will be monitored for such problems. This may require extra lab work as necessary. This therefore constitutes a special treatment procedure.   ________________________________   Radene Gunning, MD, PhD

## 2017-08-19 NOTE — Progress Notes (Signed)
Radiation Oncology         [phone] ________________________________  Name: George Stokes MRN: [id number]  Date: 08/03/2017  DOB: 07-07-1945  SIMULATION AND TREATMENT PLANNING NOTE  DIAGNOSIS:     ICD-10-CM   1. Malignant neoplasm of lower third of esophagus (HCC) C15.5      Site:  Esophagus  NARRATIVE:  The patient was brought to the CT Simulation planning suite.  the patient has not been able to tolerate the treatment position which originally worked well for him during his initial simulation. The patient therefore presents for a re-simulation to change his treatment set up Identity was confirmed.  All relevant records and images related to the planned course of therapy were reviewed.   Written consent to proceed with treatment was confirmed which was freely given after reviewing the details related to the planned course of therapy had been reviewed with the patient.  Then, the patient was set-up in a stable reproducible  supine position for radiation therapy.  CT images were obtained.  Surface markings were placed.    Medically necessary complex treatment device(s) for immobilization:   1.  Customized vac lock bag #1.  2.  Customized vac lock bag #2 3.  Customized Accuform device  The CT images were loaded into the planning software.  Then the target and avoidance structures were contoured.  Treatment planning then occurred.  The radiation prescription was entered and confirmed.  A total of 7 complex treatment devices were fabricated which relate to the designed radiation treatment fields. Each of these customized fields/ complex treatment devices will be used on a daily basis during the radiation course. I have requested : 3D Simulation  I have requested a DVH of the following structures: target, lungs, cord, heart.  The patient will undergo daily image guidance to ensure accurate localization of the target, and adequate minimize dose to the normal surrounding structures in close  proximity to the target.    PLAN:  The patient will receive 45 Gy in 25 fractions initially, followed by a 5.5 Gy boost.  ________________________________   Radene Gunning, MD, PhD

## 2017-08-20 DIAGNOSIS — R531 Weakness: Secondary | ICD-10-CM | POA: Diagnosis not present

## 2017-08-21 ENCOUNTER — Other Ambulatory Visit: Payer: Self-pay | Admitting: Oncology

## 2017-08-22 ENCOUNTER — Ambulatory Visit
Admission: RE | Admit: 2017-08-22 | Discharge: 2017-08-22 | Disposition: A | Discharge status: Home / Self Care | Payer: Medicare Other | Source: Ambulatory Visit | Attending: Radiation Oncology | Admitting: Radiation Oncology

## 2017-08-22 ENCOUNTER — Telehealth: Payer: Self-pay | Admitting: *Deleted

## 2017-08-22 DIAGNOSIS — C160 Malignant neoplasm of cardia: Secondary | ICD-10-CM | POA: Diagnosis not present

## 2017-08-22 DIAGNOSIS — C155 Malignant neoplasm of lower third of esophagus: Secondary | ICD-10-CM | POA: Diagnosis not present

## 2017-08-22 DIAGNOSIS — Z510 Encounter for antineoplastic radiation therapy: Secondary | ICD-10-CM | POA: Diagnosis not present

## 2017-08-22 NOTE — Telephone Encounter (Signed)
"  Violeta Gelinas NP, Palliative Care at [facility] calling about barium study patient missed on 08-18-2017.  Please reschedule this study.  Study needs to be coordinated with daily radiation.  Let radiation know to transport him to the barium study."  Barium study appointment tomorrow at 2:00 pm after 11:40 am radiation.  Note added to 08-23-2016 appointment.

## 2017-08-23 ENCOUNTER — Ambulatory Visit
Admission: RE | Admit: 2017-08-23 | Discharge: 2017-08-23 | Disposition: A | Discharge status: Home / Self Care | Payer: Medicare Other | Source: Ambulatory Visit | Attending: Radiation Oncology | Admitting: Radiation Oncology

## 2017-08-23 ENCOUNTER — Telehealth: Payer: Self-pay | Admitting: *Deleted

## 2017-08-23 ENCOUNTER — Ambulatory Visit (HOSPITAL_COMMUNITY): Payer: Medicare Other

## 2017-08-23 ENCOUNTER — Ambulatory Visit (HOSPITAL_COMMUNITY)
Admission: RE | Admit: 2017-08-23 | Discharge: 2017-08-23 | Disposition: A | Discharge status: Home / Self Care | Payer: Medicare Other | Source: Ambulatory Visit | Attending: Internal Medicine | Admitting: Internal Medicine

## 2017-08-23 ENCOUNTER — Telehealth: Payer: Self-pay | Admitting: Radiation Oncology

## 2017-08-23 DIAGNOSIS — Z510 Encounter for antineoplastic radiation therapy: Secondary | ICD-10-CM | POA: Diagnosis not present

## 2017-08-23 DIAGNOSIS — C160 Malignant neoplasm of cardia: Secondary | ICD-10-CM | POA: Diagnosis not present

## 2017-08-23 DIAGNOSIS — C155 Malignant neoplasm of lower third of esophagus: Secondary | ICD-10-CM | POA: Diagnosis not present

## 2017-08-23 NOTE — Telephone Encounter (Signed)
Received voicemail message from Anise Salvo that Abanda with [facility] is attempting to reach me reference this patient. Phoned Lennette Bihari at [phone]. No answer. Left message. Confirmed radiation staff transported patient to radiology for swallow study. Spoke with Ronalee Belts in radiology and he reports the patient refused the study and left their department around 1445. Called Neo on the 3200 floor at [facility] at 1549 and she confirms the patient is safe in his bed at the facility. Lennette Bihari called back. Explained the events to [place] and reinforced that the patient refused the swallow study for a second time. Lennette Bihari verbalized understanding.

## 2017-08-23 NOTE — Telephone Encounter (Signed)
Called [facility] to inform that I attempted to move up the [place] earlier today, informed by Legrand Como in Radiology that they would not be able to do this earler due to a packed schedule, spoke with Mart Piggs nurse, she verified understanding this

## 2017-08-24 ENCOUNTER — Ambulatory Visit: Payer: Medicare Other

## 2017-08-24 ENCOUNTER — Ambulatory Visit
Admission: RE | Admit: 2017-08-24 | Discharge: 2017-08-24 | Disposition: A | Discharge status: Home / Self Care | Payer: Medicare Other | Source: Ambulatory Visit | Attending: Radiation Oncology | Admitting: Radiation Oncology

## 2017-08-24 ENCOUNTER — Ambulatory Visit: Payer: Medicare Other | Admitting: Nurse Practitioner

## 2017-08-24 ENCOUNTER — Other Ambulatory Visit: Payer: Medicare Other

## 2017-08-24 DIAGNOSIS — Z510 Encounter for antineoplastic radiation therapy: Secondary | ICD-10-CM | POA: Diagnosis not present

## 2017-08-24 DIAGNOSIS — L89150 Pressure ulcer of sacral region, unstageable: Secondary | ICD-10-CM | POA: Diagnosis not present

## 2017-08-24 DIAGNOSIS — C160 Malignant neoplasm of cardia: Secondary | ICD-10-CM | POA: Diagnosis not present

## 2017-08-24 DIAGNOSIS — C155 Malignant neoplasm of lower third of esophagus: Secondary | ICD-10-CM | POA: Diagnosis not present

## 2017-08-25 ENCOUNTER — Ambulatory Visit
Admission: RE | Admit: 2017-08-25 | Discharge: 2017-08-25 | Disposition: A | Discharge status: Home / Self Care | Payer: Medicare Other | Source: Ambulatory Visit | Attending: Radiation Oncology | Admitting: Radiation Oncology

## 2017-08-25 ENCOUNTER — Ambulatory Visit (HOSPITAL_COMMUNITY): Payer: Medicare Other

## 2017-08-25 DIAGNOSIS — C155 Malignant neoplasm of lower third of esophagus: Secondary | ICD-10-CM | POA: Diagnosis not present

## 2017-08-25 DIAGNOSIS — Z510 Encounter for antineoplastic radiation therapy: Secondary | ICD-10-CM | POA: Diagnosis not present

## 2017-08-25 DIAGNOSIS — C160 Malignant neoplasm of cardia: Secondary | ICD-10-CM | POA: Diagnosis not present

## 2017-08-25 NOTE — Telephone Encounter (Signed)
Closing encounter at this time. See future RT encounter notes in reference to [facility]BS.

## 2017-08-26 ENCOUNTER — Ambulatory Visit
Admission: RE | Admit: 2017-08-26 | Discharge: 2017-08-26 | Disposition: A | Discharge status: Home / Self Care | Payer: Medicare Other | Source: Ambulatory Visit | Attending: Radiation Oncology | Admitting: Radiation Oncology

## 2017-08-26 DIAGNOSIS — Z510 Encounter for antineoplastic radiation therapy: Secondary | ICD-10-CM | POA: Insufficient documentation

## 2017-08-26 DIAGNOSIS — C160 Malignant neoplasm of cardia: Secondary | ICD-10-CM | POA: Diagnosis not present

## 2017-08-26 DIAGNOSIS — C159 Malignant neoplasm of esophagus, unspecified: Secondary | ICD-10-CM | POA: Diagnosis not present

## 2017-08-26 DIAGNOSIS — K222 Esophageal obstruction: Secondary | ICD-10-CM | POA: Diagnosis not present

## 2017-08-26 DIAGNOSIS — I10 Essential (primary) hypertension: Secondary | ICD-10-CM | POA: Diagnosis not present

## 2017-08-26 DIAGNOSIS — C155 Malignant neoplasm of lower third of esophagus: Secondary | ICD-10-CM | POA: Diagnosis not present

## 2017-08-26 DIAGNOSIS — J9611 Chronic respiratory failure with hypoxia: Secondary | ICD-10-CM | POA: Diagnosis not present

## 2017-08-27 ENCOUNTER — Other Ambulatory Visit: Payer: Self-pay | Admitting: Oncology

## 2017-08-29 ENCOUNTER — Ambulatory Visit
Admission: RE | Admit: 2017-08-29 | Discharge: 2017-08-29 | Disposition: A | Discharge status: Home / Self Care | Payer: Medicare Other | Source: Ambulatory Visit | Attending: Radiation Oncology | Admitting: Radiation Oncology

## 2017-08-29 DIAGNOSIS — R531 Weakness: Secondary | ICD-10-CM | POA: Diagnosis not present

## 2017-08-29 DIAGNOSIS — C160 Malignant neoplasm of cardia: Secondary | ICD-10-CM | POA: Diagnosis not present

## 2017-08-29 DIAGNOSIS — C155 Malignant neoplasm of lower third of esophagus: Secondary | ICD-10-CM | POA: Diagnosis not present

## 2017-08-29 DIAGNOSIS — Z510 Encounter for antineoplastic radiation therapy: Secondary | ICD-10-CM | POA: Diagnosis not present

## 2017-08-30 ENCOUNTER — Ambulatory Visit
Admission: RE | Admit: 2017-08-30 | Discharge: 2017-08-30 | Disposition: A | Discharge status: Home / Self Care | Payer: Medicare Other | Source: Ambulatory Visit | Attending: Radiation Oncology | Admitting: Radiation Oncology

## 2017-08-30 ENCOUNTER — Inpatient Hospital Stay: Payer: No Typology Code available for payment source | Attending: Oncology | Admitting: Nurse Practitioner

## 2017-08-30 VITALS — BP 119/66 | HR 73 | Temp 97.6°F | Resp 18 | Ht 67.25 in | Wt 115.3 lb

## 2017-08-30 DIAGNOSIS — Z5111 Encounter for antineoplastic chemotherapy: Secondary | ICD-10-CM | POA: Insufficient documentation

## 2017-08-30 DIAGNOSIS — R634 Abnormal weight loss: Secondary | ICD-10-CM

## 2017-08-30 DIAGNOSIS — F1010 Alcohol abuse, uncomplicated: Secondary | ICD-10-CM

## 2017-08-30 DIAGNOSIS — I10 Essential (primary) hypertension: Secondary | ICD-10-CM | POA: Insufficient documentation

## 2017-08-30 DIAGNOSIS — Z510 Encounter for antineoplastic radiation therapy: Secondary | ICD-10-CM | POA: Diagnosis not present

## 2017-08-30 DIAGNOSIS — C160 Malignant neoplasm of cardia: Secondary | ICD-10-CM | POA: Diagnosis not present

## 2017-08-30 DIAGNOSIS — C155 Malignant neoplasm of lower third of esophagus: Secondary | ICD-10-CM | POA: Insufficient documentation

## 2017-08-30 DIAGNOSIS — R1310 Dysphagia, unspecified: Secondary | ICD-10-CM

## 2017-08-30 DIAGNOSIS — F1020 Alcohol dependence, uncomplicated: Secondary | ICD-10-CM | POA: Insufficient documentation

## 2017-08-30 DIAGNOSIS — D649 Anemia, unspecified: Secondary | ICD-10-CM | POA: Insufficient documentation

## 2017-08-30 NOTE — Progress Notes (Addendum)
  [facility] OFFICE PROGRESS NOTE   Diagnosis: Esophagus cancer  INTERVAL HISTORY:   George Stokes returns for follow-up.  He continues radiation.  He completed week 3 Taxol/carboplatin 08/18/2017.  He missed his chemotherapy appointment last week.  He has intermittent "regurgitation"/vomiting.  He does not feel this is related to chemotherapy.  He continues tube feeds.  He estimates the tube feeds run about 12-14 hours/day.  No mouth sores.  No diarrhea.  No numbness or tingling in his hands or feet.  He denies dysphagia and odynophagia.  He would like to eat.  Objective:  Vital signs in last 24 hours:  Blood pressure 119/66, pulse 73, temperature 97.6 F (36.4 C), temperature source Oral, resp. rate 18, height 5' 7.25" (1.708 m), weight 115 lb 4.8 oz (52.3 kg), SpO2 100 %.    HEENT: No thrush or ulcers. Resp: Distant breath sounds. Cardio: Regular rate and rhythm. GI: Abdomen soft and nontender.  No hepatomegaly.  Left upper quadrant feeding tube site without erythema. Vascular: No leg edema.   Lab Results:  Lab Results  Component Value Date   WBC 4.8 08/19/2017   HGB 10.2 (L) 08/02/2017   HCT 28.1 (L) 08/19/2017   MCV 92.1 08/19/2017   PLT 397 08/19/2017   NEUTROABS 4.0 08/19/2017    Imaging:  No results found.  Medications: I have reviewed the patient's current medications.  Assessment/Plan: 1.Esophagus cancer  Distal esophageal stricture noted on esophagram 07/11/2017  CTs of the chest, abdomen, and pelvis 07/08/2017-right lung pneumonia, emphysema, no evidence of metastatic disease  Upper endoscopy 07/13/2017-GE junction mass, could not be passed with standard endoscope, biopsy suspicious for adenocarcinoma  Right pleural fluid cytology 07/26/2017-negative  Initiation of weekly Taxol/carboplatin and radiation 08/01/2017  2.Solid/liquid dysphagia secondary to #1  Placement of jejunostomy feeding tube 07/15/2017, revised  07/20/2017  3.Weight loss/malnutrition, maintained on jejunostomy tube feedings  4.COPD  5.Heavy alcohol use  6.Gout  7.Hypertension  8.Pneumonia January 2018     Disposition: George Stokes continues radiation.  He has completed 3 weekly treatments with Taxol/carboplatin.  He will complete a fourth weekly treatment 08/31/2017.  He will return for the fifth and final chemotherapy on 09/07/2017.  He is scheduled to complete radiation 09/08/2017.  He has been referred for a swallowing evaluation.  It does not appear this has been completed.  We will try to get this scheduled to assess his ability regarding oral intake.  For now, he continues the tube feedings.  He will return for a follow-up visit in approximately 3 weeks.  He will contact the office in the interim with any problems.  Patient seen with Dr. Benay Spice.    Ned Card ANP/GNP-BC   08/30/2017  4:26 PM  This was a shared visit with Ned Card.  George Stokes is tolerating the chemotherapy and radiation well.  Dysphasia has improved.  We will reschedule a barium swallow evaluation to be sure it is safe for him to begin a diet.  Julieanne Manson, MD

## 2017-08-31 ENCOUNTER — Inpatient Hospital Stay: Payer: No Typology Code available for payment source

## 2017-08-31 ENCOUNTER — Ambulatory Visit
Admission: RE | Admit: 2017-08-31 | Discharge: 2017-08-31 | Disposition: A | Discharge status: Home / Self Care | Payer: Medicare Other | Source: Ambulatory Visit | Attending: Radiation Oncology | Admitting: Radiation Oncology

## 2017-08-31 VITALS — BP 129/82 | HR 89 | Temp 97.0°F | Resp 17

## 2017-08-31 DIAGNOSIS — C160 Malignant neoplasm of cardia: Secondary | ICD-10-CM | POA: Diagnosis not present

## 2017-08-31 DIAGNOSIS — C155 Malignant neoplasm of lower third of esophagus: Secondary | ICD-10-CM | POA: Diagnosis not present

## 2017-08-31 DIAGNOSIS — Z510 Encounter for antineoplastic radiation therapy: Secondary | ICD-10-CM | POA: Diagnosis not present

## 2017-08-31 DIAGNOSIS — L89150 Pressure ulcer of sacral region, unstageable: Secondary | ICD-10-CM | POA: Diagnosis not present

## 2017-08-31 LAB — CBC WITH DIFFERENTIAL (CANCER CENTER ONLY)
BASOS PCT: 0 %
Basophils Absolute: 0 10*3/uL (ref 0.0–0.1)
EOS ABS: 0.1 10*3/uL (ref 0.0–0.5)
EOS PCT: 1 %
HEMATOCRIT: 26.2 % — AB (ref 38.4–49.9)
Hemoglobin: 8.6 g/dL — ABNORMAL LOW (ref 13.0–17.1)
Lymphocytes Relative: 6 %
Lymphs Abs: 0.3 10*3/uL — ABNORMAL LOW (ref 0.9–3.3)
MCH: 31 pg (ref 27.2–33.4)
MCHC: 32.8 g/dL (ref 32.0–36.0)
MCV: 94.6 fL (ref 79.3–98.0)
MONO ABS: 0.7 10*3/uL (ref 0.1–0.9)
MONOS PCT: 13 %
Neutro Abs: 4.5 10*3/uL (ref 1.5–6.5)
Neutrophils Relative %: 80 %
Platelet Count: 207 10*3/uL (ref 140–400)
RBC: 2.77 MIL/uL — ABNORMAL LOW (ref 4.20–5.82)
RDW: 18 % — ABNORMAL HIGH (ref 11.0–14.6)
WBC Count: 5.7 10*3/uL (ref 4.0–10.3)

## 2017-08-31 LAB — CMP (CANCER CENTER ONLY)
ALBUMIN: 3 g/dL — AB (ref 3.5–5.0)
ALK PHOS: 106 U/L (ref 40–150)
ALT: 10 U/L (ref 0–55)
AST: 12 U/L (ref 5–34)
Anion gap: 7 (ref 3–11)
BILIRUBIN TOTAL: 0.4 mg/dL (ref 0.2–1.2)
BUN: 22 mg/dL (ref 7–26)
CALCIUM: 9.6 mg/dL (ref 8.4–10.4)
CO2: 34 mmol/L — AB (ref 22–29)
CREATININE: 0.7 mg/dL (ref 0.70–1.30)
Chloride: 95 mmol/L — ABNORMAL LOW (ref 98–109)
GFR, Est AFR Am: >60 mL/min (ref >60)
GFR, Estimated: >60 mL/min (ref >60)
GLUCOSE: 104 mg/dL (ref 70–140)
Potassium: 4.6 mmol/L (ref 3.5–5.1)
SODIUM: 136 mmol/L (ref 136–145)
TOTAL PROTEIN: 6.4 g/dL (ref 6.4–8.3)

## 2017-08-31 MED ORDER — SODIUM CHLORIDE 0.9 % IV SOLN
20.0000 mg | Freq: Once | INTRAVENOUS | Status: AC
  Administered 2017-08-31: 20 mg via INTRAVENOUS
  Filled 2017-08-31: qty 2

## 2017-08-31 MED ORDER — DEXAMETHASONE SODIUM PHOSPHATE 10 MG/ML IJ SOLN
INTRAMUSCULAR | Status: AC
  Filled 2017-08-31: qty 1

## 2017-08-31 MED ORDER — DIPHENHYDRAMINE HCL 50 MG/ML IJ SOLN
25.0000 mg | Freq: Once | INTRAMUSCULAR | Status: AC
  Administered 2017-08-31: 25 mg via INTRAVENOUS

## 2017-08-31 MED ORDER — SODIUM CHLORIDE 0.9 % IV SOLN
170.0000 mg | Freq: Once | INTRAVENOUS | Status: AC
  Administered 2017-08-31: 170 mg via INTRAVENOUS
  Filled 2017-08-31: qty 17

## 2017-08-31 MED ORDER — DIPHENHYDRAMINE HCL 50 MG/ML IJ SOLN
INTRAMUSCULAR | Status: AC
  Filled 2017-08-31: qty 1

## 2017-08-31 MED ORDER — SODIUM CHLORIDE 0.9 % IV SOLN
50.0000 mg/m2 | Freq: Once | INTRAVENOUS | Status: AC
  Administered 2017-08-31: 84 mg via INTRAVENOUS
  Filled 2017-08-31: qty 14

## 2017-08-31 MED ORDER — SODIUM CHLORIDE 0.9 % IV SOLN
Freq: Once | INTRAVENOUS | Status: AC
  Administered 2017-08-31: 12:00:00 via INTRAVENOUS

## 2017-08-31 MED ORDER — PALONOSETRON HCL INJECTION 0.25 MG/5ML
INTRAVENOUS | Status: AC
  Filled 2017-08-31: qty 5

## 2017-08-31 MED ORDER — FAMOTIDINE IN NACL 20-0.9 MG/50ML-% IV SOLN
20.0000 mg | Freq: Once | INTRAVENOUS | Status: DC
Start: 2017-08-31 — End: 2017-08-31

## 2017-08-31 MED ORDER — DEXAMETHASONE SODIUM PHOSPHATE 10 MG/ML IJ SOLN
10.0000 mg | Freq: Once | INTRAMUSCULAR | Status: AC
  Administered 2017-08-31: 10 mg via INTRAVENOUS

## 2017-08-31 MED ORDER — PALONOSETRON HCL INJECTION 0.25 MG/5ML
0.2500 mg | Freq: Once | INTRAVENOUS | Status: AC
  Administered 2017-08-31: 0.25 mg via INTRAVENOUS

## 2017-08-31 NOTE — Patient Instructions (Signed)
[facility] Discharge Instructions for Patients Receiving Chemotherapy  Today you received the following chemotherapy agents paclitaxel (Taxol) and carboplatin (Paraplatin)  To help prevent nausea and vomiting after your treatment, we encourage you to take your nausea medication as directed   If you develop nausea and vomiting that is not controlled by your nausea medication, call the clinic.   BELOW ARE SYMPTOMS THAT SHOULD BE REPORTED IMMEDIATELY:  *FEVER GREATER THAN 100.5 F  *CHILLS WITH OR WITHOUT FEVER  NAUSEA AND VOMITING THAT IS NOT CONTROLLED WITH YOUR NAUSEA MEDICATION  *UNUSUAL SHORTNESS OF BREATH  *UNUSUAL BRUISING OR BLEEDING  TENDERNESS IN MOUTH AND THROAT WITH OR WITHOUT PRESENCE OF ULCERS  *URINARY PROBLEMS  *BOWEL PROBLEMS  UNUSUAL RASH Items with * indicate a potential emergency and should be followed up as soon as possible.  Feel free to call the clinic should you have any questions or concerns. The clinic phone number is [phone].  Please show the CHEMO ALERT CARD at check-in to the Emergency Department and triage nurse.   

## 2017-09-01 ENCOUNTER — Other Ambulatory Visit: Payer: Self-pay | Admitting: Oncology

## 2017-09-01 ENCOUNTER — Ambulatory Visit
Admission: RE | Admit: 2017-09-01 | Discharge: 2017-09-01 | Disposition: A | Discharge status: Home / Self Care | Payer: Medicare Other | Source: Ambulatory Visit | Attending: Radiation Oncology | Admitting: Radiation Oncology

## 2017-09-01 DIAGNOSIS — C160 Malignant neoplasm of cardia: Secondary | ICD-10-CM | POA: Diagnosis not present

## 2017-09-01 DIAGNOSIS — C155 Malignant neoplasm of lower third of esophagus: Secondary | ICD-10-CM | POA: Diagnosis not present

## 2017-09-01 DIAGNOSIS — Z510 Encounter for antineoplastic radiation therapy: Secondary | ICD-10-CM | POA: Diagnosis not present

## 2017-09-02 ENCOUNTER — Ambulatory Visit: Payer: Medicare Other

## 2017-09-02 ENCOUNTER — Ambulatory Visit: Admission: RE | Admit: 2017-09-02 | Payer: Medicare Other | Source: Ambulatory Visit

## 2017-09-02 DIAGNOSIS — C159 Malignant neoplasm of esophagus, unspecified: Secondary | ICD-10-CM | POA: Diagnosis not present

## 2017-09-02 DIAGNOSIS — J9611 Chronic respiratory failure with hypoxia: Secondary | ICD-10-CM | POA: Diagnosis not present

## 2017-09-02 DIAGNOSIS — I10 Essential (primary) hypertension: Secondary | ICD-10-CM | POA: Diagnosis not present

## 2017-09-02 DIAGNOSIS — Z510 Encounter for antineoplastic radiation therapy: Secondary | ICD-10-CM | POA: Diagnosis not present

## 2017-09-02 DIAGNOSIS — E46 Unspecified protein-calorie malnutrition: Secondary | ICD-10-CM | POA: Diagnosis not present

## 2017-09-02 DIAGNOSIS — R1319 Other dysphagia: Secondary | ICD-10-CM | POA: Diagnosis not present

## 2017-09-02 DIAGNOSIS — R1310 Dysphagia, unspecified: Secondary | ICD-10-CM | POA: Diagnosis not present

## 2017-09-02 DIAGNOSIS — C155 Malignant neoplasm of lower third of esophagus: Secondary | ICD-10-CM | POA: Diagnosis not present

## 2017-09-02 DIAGNOSIS — J449 Chronic obstructive pulmonary disease, unspecified: Secondary | ICD-10-CM | POA: Diagnosis not present

## 2017-09-05 ENCOUNTER — Other Ambulatory Visit (HOSPITAL_COMMUNITY): Payer: Self-pay | Admitting: Internal Medicine

## 2017-09-05 ENCOUNTER — Ambulatory Visit (HOSPITAL_COMMUNITY)
Admission: RE | Admit: 2017-09-05 | Discharge: 2017-09-05 | Disposition: A | Discharge status: Home / Self Care | Payer: No Typology Code available for payment source | Source: Ambulatory Visit | Attending: Internal Medicine | Admitting: Internal Medicine

## 2017-09-05 ENCOUNTER — Ambulatory Visit: Payer: Medicare Other

## 2017-09-05 ENCOUNTER — Ambulatory Visit
Admission: RE | Admit: 2017-09-05 | Discharge: 2017-09-05 | Disposition: A | Discharge status: Home / Self Care | Payer: Medicare Other | Source: Ambulatory Visit | Attending: Radiation Oncology | Admitting: Radiation Oncology

## 2017-09-05 DIAGNOSIS — R1312 Dysphagia, oropharyngeal phase: Secondary | ICD-10-CM | POA: Insufficient documentation

## 2017-09-05 DIAGNOSIS — R1310 Dysphagia, unspecified: Secondary | ICD-10-CM | POA: Diagnosis not present

## 2017-09-05 DIAGNOSIS — R1314 Dysphagia, pharyngoesophageal phase: Secondary | ICD-10-CM

## 2017-09-05 DIAGNOSIS — R1319 Other dysphagia: Secondary | ICD-10-CM

## 2017-09-05 DIAGNOSIS — Z510 Encounter for antineoplastic radiation therapy: Secondary | ICD-10-CM | POA: Diagnosis not present

## 2017-09-05 DIAGNOSIS — C155 Malignant neoplasm of lower third of esophagus: Secondary | ICD-10-CM | POA: Diagnosis not present

## 2017-09-05 DIAGNOSIS — K222 Esophageal obstruction: Secondary | ICD-10-CM | POA: Diagnosis not present

## 2017-09-05 DIAGNOSIS — C160 Malignant neoplasm of cardia: Secondary | ICD-10-CM | POA: Diagnosis not present

## 2017-09-06 ENCOUNTER — Ambulatory Visit: Payer: Medicare Other

## 2017-09-06 ENCOUNTER — Ambulatory Visit
Admission: RE | Admit: 2017-09-06 | Discharge: 2017-09-06 | Disposition: A | Discharge status: Home / Self Care | Payer: Medicare Other | Source: Ambulatory Visit | Attending: Radiation Oncology | Admitting: Radiation Oncology

## 2017-09-06 DIAGNOSIS — C160 Malignant neoplasm of cardia: Secondary | ICD-10-CM | POA: Diagnosis not present

## 2017-09-07 ENCOUNTER — Ambulatory Visit: Payer: Medicare Other

## 2017-09-07 ENCOUNTER — Ambulatory Visit
Admission: RE | Admit: 2017-09-07 | Discharge: 2017-09-07 | Disposition: A | Discharge status: Home / Self Care | Payer: Medicare Other | Source: Ambulatory Visit | Attending: Radiation Oncology | Admitting: Radiation Oncology

## 2017-09-07 ENCOUNTER — Ambulatory Visit: Admission: RE | Admit: 2017-09-07 | Payer: Medicare Other | Source: Ambulatory Visit

## 2017-09-07 ENCOUNTER — Other Ambulatory Visit: Payer: Medicare Other

## 2017-09-07 DIAGNOSIS — C160 Malignant neoplasm of cardia: Secondary | ICD-10-CM | POA: Diagnosis not present

## 2017-09-07 DIAGNOSIS — L89150 Pressure ulcer of sacral region, unstageable: Secondary | ICD-10-CM | POA: Diagnosis not present

## 2017-09-08 ENCOUNTER — Ambulatory Visit: Payer: Medicare Other

## 2017-09-08 ENCOUNTER — Other Ambulatory Visit: Payer: Self-pay

## 2017-09-08 ENCOUNTER — Encounter (HOSPITAL_COMMUNITY): Payer: Self-pay | Admitting: Emergency Medicine

## 2017-09-08 ENCOUNTER — Ambulatory Visit
Admission: RE | Admit: 2017-09-08 | Discharge: 2017-09-08 | Disposition: A | Discharge status: Home / Self Care | Payer: Medicare Other | Source: Ambulatory Visit | Attending: Radiation Oncology | Admitting: Radiation Oncology

## 2017-09-08 ENCOUNTER — Other Ambulatory Visit: Payer: Self-pay | Admitting: Radiation Oncology

## 2017-09-08 ENCOUNTER — Inpatient Hospital Stay (HOSPITAL_COMMUNITY)
Admission: EM | Admit: 2017-09-08 | Discharge: 2017-09-14 | DRG: 393 | Disposition: A | Discharge status: Skilled Nursing Facility | Payer: Medicare Other | Attending: Internal Medicine | Admitting: Internal Medicine

## 2017-09-08 DIAGNOSIS — R112 Nausea with vomiting, unspecified: Secondary | ICD-10-CM | POA: Diagnosis present

## 2017-09-08 DIAGNOSIS — Z681 Body mass index (BMI) 19 or less, adult: Secondary | ICD-10-CM

## 2017-09-08 DIAGNOSIS — C159 Malignant neoplasm of esophagus, unspecified: Secondary | ICD-10-CM | POA: Diagnosis not present

## 2017-09-08 DIAGNOSIS — J449 Chronic obstructive pulmonary disease, unspecified: Secondary | ICD-10-CM | POA: Diagnosis present

## 2017-09-08 DIAGNOSIS — I10 Essential (primary) hypertension: Secondary | ICD-10-CM | POA: Diagnosis not present

## 2017-09-08 DIAGNOSIS — E860 Dehydration: Secondary | ICD-10-CM | POA: Diagnosis present

## 2017-09-08 DIAGNOSIS — K5909 Other constipation: Secondary | ICD-10-CM | POA: Diagnosis present

## 2017-09-08 DIAGNOSIS — Z515 Encounter for palliative care: Secondary | ICD-10-CM

## 2017-09-08 DIAGNOSIS — K9423 Gastrostomy malfunction: Secondary | ICD-10-CM

## 2017-09-08 DIAGNOSIS — Z8701 Personal history of pneumonia (recurrent): Secondary | ICD-10-CM

## 2017-09-08 DIAGNOSIS — R0902 Hypoxemia: Secondary | ICD-10-CM

## 2017-09-08 DIAGNOSIS — T85528A Displacement of other gastrointestinal prosthetic devices, implants and grafts, initial encounter: Secondary | ICD-10-CM

## 2017-09-08 DIAGNOSIS — Z9221 Personal history of antineoplastic chemotherapy: Secondary | ICD-10-CM

## 2017-09-08 DIAGNOSIS — K9413 Enterostomy malfunction: Principal | ICD-10-CM | POA: Diagnosis present

## 2017-09-08 DIAGNOSIS — R627 Adult failure to thrive: Secondary | ICD-10-CM | POA: Diagnosis present

## 2017-09-08 DIAGNOSIS — E871 Hypo-osmolality and hyponatremia: Secondary | ICD-10-CM | POA: Diagnosis not present

## 2017-09-08 DIAGNOSIS — E46 Unspecified protein-calorie malnutrition: Secondary | ICD-10-CM

## 2017-09-08 DIAGNOSIS — Z7951 Long term (current) use of inhaled steroids: Secondary | ICD-10-CM

## 2017-09-08 DIAGNOSIS — R1312 Dysphagia, oropharyngeal phase: Secondary | ICD-10-CM | POA: Diagnosis present

## 2017-09-08 DIAGNOSIS — D630 Anemia in neoplastic disease: Secondary | ICD-10-CM | POA: Diagnosis present

## 2017-09-08 DIAGNOSIS — E43 Unspecified severe protein-calorie malnutrition: Secondary | ICD-10-CM | POA: Diagnosis present

## 2017-09-08 DIAGNOSIS — M1A09X0 Idiopathic chronic gout, multiple sites, without tophus (tophi): Secondary | ICD-10-CM | POA: Diagnosis present

## 2017-09-08 DIAGNOSIS — J9611 Chronic respiratory failure with hypoxia: Secondary | ICD-10-CM | POA: Diagnosis not present

## 2017-09-08 DIAGNOSIS — C155 Malignant neoplasm of lower third of esophagus: Secondary | ICD-10-CM | POA: Diagnosis present

## 2017-09-08 DIAGNOSIS — D649 Anemia, unspecified: Secondary | ICD-10-CM

## 2017-09-08 DIAGNOSIS — K9419 Other complications of enterostomy: Secondary | ICD-10-CM | POA: Diagnosis not present

## 2017-09-08 DIAGNOSIS — Z809 Family history of malignant neoplasm, unspecified: Secondary | ICD-10-CM

## 2017-09-08 DIAGNOSIS — E538 Deficiency of other specified B group vitamins: Secondary | ICD-10-CM | POA: Diagnosis present

## 2017-09-08 DIAGNOSIS — Z7189 Other specified counseling: Secondary | ICD-10-CM

## 2017-09-08 DIAGNOSIS — K222 Esophageal obstruction: Secondary | ICD-10-CM

## 2017-09-08 DIAGNOSIS — Z9981 Dependence on supplemental oxygen: Secondary | ICD-10-CM

## 2017-09-08 DIAGNOSIS — Z79899 Other long term (current) drug therapy: Secondary | ICD-10-CM

## 2017-09-08 DIAGNOSIS — Z87891 Personal history of nicotine dependence: Secondary | ICD-10-CM

## 2017-09-08 LAB — BASIC METABOLIC PANEL
Anion gap: 10 (ref 5–15)
BUN: 22 mg/dL — AB (ref 6–20)
CALCIUM: 9.8 mg/dL (ref 8.9–10.3)
CHLORIDE: 99 mmol/L — AB (ref 101–111)
CO2: 32 mmol/L (ref 22–32)
CREATININE: 0.64 mg/dL (ref 0.61–1.24)
Glucose, Bld: 92 mg/dL (ref 65–99)
Potassium: 4.2 mmol/L (ref 3.5–5.1)
SODIUM: 141 mmol/L (ref 135–145)

## 2017-09-08 LAB — CBC
HCT: 27.4 % — ABNORMAL LOW (ref 39.0–52.0)
HEMOGLOBIN: 9 g/dL — AB (ref 13.0–17.0)
MCH: 31.8 pg (ref 26.0–34.0)
MCHC: 32.8 g/dL (ref 30.0–36.0)
MCV: 96.8 fL (ref 78.0–100.0)
PLATELETS: 379 10*3/uL (ref 150–400)
RBC: 2.83 MIL/uL — ABNORMAL LOW (ref 4.22–5.81)
RDW: 18.4 % — ABNORMAL HIGH (ref 11.5–15.5)
WBC: 6.1 10*3/uL (ref 4.0–10.5)

## 2017-09-08 MED ORDER — ONDANSETRON HCL 4 MG PO TABS: 8.0000 mg | ORAL_TABLET | Freq: Three times a day (TID) | ORAL | 1 refills | Status: DC

## 2017-09-08 MED ORDER — ONDANSETRON HCL 8 MG PO TABS: 8.0000 mg | ORAL_TABLET | Freq: Three times a day (TID) | ORAL | 1 refills | Status: AC

## 2017-09-08 NOTE — ED Provider Notes (Signed)
[facility] DEPT Provider Note   CSN: [id number] Arrival date & time: 09/08/17  1940     History   Chief Complaint Chief Complaint  Patient presents with  . J-tube need to be replace.    HPI George Stokes is a 72 y.o. male with past medical history of COPD, esophageal cancer status post J-tube placement on 07/26/2017, hypertension, presenting to the ED from [facility] for J-tube displacement.  Per patient's nurse at [facility] [name] was found to be dislodged at 6 PM tonight, last seen in place at 4:30 PM tonight.  She states he is completely dependent on G-tube for feeding.  Patient denies pain or other medical complaints today. Per chart review, patient recently had swallow study done on 09/05/2017, however portal he did not past the screening.  Of note, per chart review patient recently had J-tube replaced on 08/16/2017 by interventional radiology under fluoroscopy.   The history is provided by the patient and the nursing home.    Past Medical History:  Diagnosis Date  . COPD (chronic obstructive pulmonary disease) ([place])   . Dyspnea   . esophageal ca dx'd 07/13/17   mass at GE junction  . Esophageal mass   . Gout   . Heart murmur    during teenage years   . Hypertension   . On home oxygen therapy    1-2 L/M   . Pneumonia     Patient Active Problem List   Diagnosis Date Noted  . Pressure injury of skin 08/01/2017  . Atelectasis of right lung   . H/O exploratory laparotomy 07/20/2017  . Acute respiratory failure ([place]) 07/20/2017  . Malnutrition of moderate degree 07/16/2017  . Malignant neoplasm of lower third of esophagus ([place])   . Esophageal dysphagia   . Esophageal mass   . Esophageal stricture   . Protein-calorie malnutrition, severe ([place]) 07/12/2017  . Hypomagnesemia   . Hypokalemia   . Lobar pneumonia ([place])   . Intractable nausea and vomiting 07/08/2017  . CAP (community acquired pneumonia) 07/08/2017  . Acute kidney injury ([place])    . Intractable vomiting with nausea 06/29/2017  . Sleep disturbance 01/04/2017  . Idiopathic chronic gout of multiple sites without tophus 01/04/2017  . Chronic respiratory failure with hypoxia ([place]) 01/13/2016  . COPD with hypoxia ([place]) 05/03/2014  . Gout 10/30/2011  . Essential hypertension 10/30/2011  . Dyslipidemia 10/30/2011    Past Surgical History:  Procedure Laterality Date  . ESOPHAGOGASTRODUODENOSCOPY (EGD) WITH PROPOFOL N/A 07/13/2017   Procedure: ESOPHAGOGASTRODUODENOSCOPY (EGD) WITH PROPOFOL;  Surgeon: Mauri Pole, MD;  Location: [facility] ENDOSCOPY;  Service: Endoscopy;  Laterality: N/A;  . IR REPLC DUODEN/JEJUNO TUBE PERCUT W/FLUORO  08/16/2017  . LAPAROTOMY N/A 07/15/2017   Procedure: FEEDING JEJUNOSTOMY TUBE PLACEMENT;  Surgeon: Fanny Skates, MD;  Location: [facility] ORS;  Service: General;  Laterality: N/A;  . LAPAROTOMY N/A 07/20/2017   Procedure: EXPLORATORY LAPAROTOMY; REVISION OF JEJUNOSTOMY;  Surgeon: Excell Seltzer, MD;  Location: [facility] ORS;  Service: General;  Laterality: N/A;  . TONSILLECTOMY         Home Medications    Prior to Admission medications   Medication Sig Start Date End Date Taking? Authorizing Provider  albuterol (PROVENTIL HFA;VENTOLIN HFA) 108 (90 Base) MCG/ACT inhaler Inhale 2 puffs into the lungs every 6 (six) hours as needed for wheezing or shortness of breath. 08/02/17  Yes Emokpae, Courage, MD  amLODipine (NORVASC) 10 MG tablet Take 1 tablet (10 mg total) by mouth daily. 01/04/17  Yes Sagardia,  Ines Bloomer, MD  clotrimazole-betamethasone (LOTRISONE) cream Apply 1 application topically 2 (two) times daily. 01/04/17  Yes Sagardia, Ines Bloomer, MD  heparin lock flush 100 UNIT/ML SOLN injection 2.5 mLs (250 Units total) by Intracatheter route once as needed ([place] or PICC flush). 08/02/17  Yes Roxan Hockey, MD  HYDROcodone-acetaminophen (NORCO/VICODIN) 5-325 MG tablet Take 1 tablet by mouth every 6 (six) hours as needed for severe pain. 08/02/17   Yes Emokpae, Courage, MD  indomethacin (INDOCIN) 25 MG capsule Take 25 mg by mouth daily as needed for mild pain.   Yes [provider]  Nutritional Supplements (FEEDING SUPPLEMENT, OSMOLITE 1.5 CAL,) LIQD 60 mL an hour via PEG tube continuously--with water flushes 50 mL every 4 hours 08/02/17  Yes Emokpae, Courage, MD  ondansetron (ZOFRAN) 8 MG tablet Take 1 tablet (8 mg total) by mouth every 8 (eight) hours. 09/08/17  Yes Hayden Pedro, PA-C  Oxycodone HCl 10 MG TABS Take 1 tablet (10 mg total) by mouth every 4 (four) hours as needed for severe pain. 08/02/17  Yes Roxan Hockey, MD  SYMBICORT 160-4.5 MCG/ACT inhaler Inhale 2 puffs into the lungs 2 (two) times daily. 01/24/17  Yes Tanda Rockers, MD  zolpidem (AMBIEN) 5 MG tablet Take 1 tablet daily at bedtime as needed 05/01/15  Yes Le, Thao P, DO  ipratropium-albuterol (DUONEB) 0.5-2.5 (3) MG/3ML SOLN Take 3 mLs by nebulization 3 (three) times daily. Patient not taking: Reported on 09/08/2017 08/02/17   Roxan Hockey, MD  metoprolol tartrate (LOPRESSOR) 25 mg/10 mL SUSP Place 10 mLs (25 mg total) into feeding tube 2 (two) times daily. Patient not taking: Reported on 09/08/2017 08/02/17   Roxan Hockey, MD  Multiple Vitamin (MULTIVITAMIN) LIQD Take 15 mLs by mouth daily. Patient not taking: Reported on 09/08/2017 08/03/17   Roxan Hockey, MD  ranitidine (ZANTAC) 150 MG/10ML syrup Place 10 mLs (150 mg total) into feeding tube 2 (two) times daily. Patient not taking: Reported on 09/08/2017 08/02/17   Roxan Hockey, MD  Tiotropium Bromide Monohydrate (SPIRIVA RESPIMAT) 2.5 MCG/ACT AERS Inhale 2 puffs into the lungs daily. Patient not taking: Reported on 06/29/2017 12/31/16   Tanda Rockers, MD    Family History Family History  Problem Relation Age of Onset  . Dementia Mother   . Cancer Father   . Gout Father   . Thyroid disease Sister   . Kidney disease Brother   . Gout Brother   . Diabetes Maternal Grandmother   . Stroke  Maternal Grandfather     Social History Social History   Tobacco Use  . Smoking status: Former Smoker    Packs/day: 2.00    Years: 43.00    Pack years: 86.00    Types: Cigarettes    Last attempt to quit: 05/03/2009    Years since quitting: 8.3  . Smokeless tobacco: Never Used  Substance Use Topics  . Alcohol use: Yes    Alcohol/week: 0.0 oz    Comment: vodka daily;1-2 drinks in last 4 weeks   . Drug use: No     Allergies   Patient has no known allergies.   Review of Systems Review of Systems  Gastrointestinal: Negative for abdominal pain and nausea.       J-tube problem  All other systems reviewed and are negative.    Physical Exam Updated Vital Signs BP 128/77 (BP Location: Left Arm)   Pulse 85   Temp 98.3 F (36.8 C) (Oral)   Resp 16   Ht 5\' 7"  (  1.702 m)   Wt 52.2 kg (115 lb)   SpO2 100%   BMI 18.01 kg/m   Physical Exam  Constitutional: He appears well-developed and well-nourished. No distress.  HENT:  Head: Normocephalic and atraumatic.  Eyes: Conjunctivae are normal.  Cardiovascular: Normal rate, regular rhythm and intact distal pulses.  Pulmonary/Chest: Effort normal and breath sounds normal.  Abdominal: Soft. Bowel sounds are normal. He exhibits no distension. There is no tenderness. There is no rebound and no guarding.  J-tube site left abdomen, without purulent drainage or surrounding erythema.   Neurological: He is alert.  Skin: Skin is warm.  Psychiatric: He has a normal mood and affect. His behavior is normal.  Nursing note and vitals reviewed.    ED Treatments / Results  Labs (all labs ordered are listed, but only abnormal results are displayed) Labs Reviewed  CBC - Abnormal; Notable for the following components:      Result Value   RBC 2.83 (*)    Hemoglobin 9.0 (*)    HCT 27.4 (*)    RDW 18.4 (*)    All other components within normal limits  BASIC METABOLIC PANEL - Abnormal; Notable for the following components:   Chloride 99  (*)    BUN 22 (*)    All other components within normal limits    EKG  EKG Interpretation None       Radiology No results found.  Procedures Procedures (including critical care time)  Medications Ordered in ED Medications - No data to display   Initial Impression / Assessment and Plan / ED Course  I have reviewed the triage vital signs and the nursing notes.  Pertinent labs & imaging results that were available during my care of the patient were reviewed by me and considered in my medical decision making (see chart for details).  Clinical Course as of Sep 09 2318  Thu Sep 08, 2017  2105 Spoke with patients nurse, Nia, at [facility] [place]. She reports last seeing J-tube in place at 4:30pm and noticed it was out at 6pm. He is completely reliant on J-tube for feeding.  [JR]  2113 Spoke with Dr. Earleen Newport with interventional radiology, who recommends patient to come in overnight so that they can attempt to replace it tomorrow morning.  [JR]  2317 Dr. Maudie Mercury with [facility] accepting admission.  [JR]    Clinical Course User Index [JR] Ilario Dhaliwal, [place] N, PA-C    Patient presenting from [place] for accidental removal of G-tube that was done sometime between 4:30 PM and 6 PM tonight.  No other medical complaints today.  Attempted to replace J-tube at bedside with Dr. Laverta Baltimore, however was unsuccessful.  Consulted interventional radiology, spoke with Dr. Carman Ching who recommends overnight stay and they will attempt to replace J-tube in the morning.  Admission labs ordered, BMP and CBC appear to be at patient's baseline.  Will admit to hospitalist; Dr. Maudie Mercury accepting.  The patient appears reasonably stabilized for admission considering the current resources, flow, and capabilities available in the ED at this time, and I doubt any other [facility] requiring further screening and/or treatment in the ED prior to admission.  Final Clinical Impressions(s) / ED Diagnoses   Final diagnoses:  Jejunostomy tube fell  out    ED Discharge Orders    None       Lavontae Cornia, [place] N, PA-C 09/08/17 2320    Margette Fast, MD 09/09/17 1002

## 2017-09-08 NOTE — ED Triage Notes (Signed)
Pt BIB [facility] from [place] after J-tube became dislodged at 1900. Pt reported he did not know how it came out. Nursing home staff reports pt pulls it out often because he wants to eat. Pt denies pain at site.

## 2017-09-08 NOTE — ED Notes (Signed)
Bed: JE56 Expected date:  Expected time:  Means of arrival:  Comments: EMS j tube fell out

## 2017-09-08 NOTE — ED Notes (Signed)
I attempted twice to collect labs and was unsuccessful 

## 2017-09-09 ENCOUNTER — Ambulatory Visit: Payer: Medicare Other

## 2017-09-09 ENCOUNTER — Observation Stay (HOSPITAL_COMMUNITY): Payer: Medicare Other

## 2017-09-09 ENCOUNTER — Ambulatory Visit
Admission: RE | Admit: 2017-09-09 | Discharge: 2017-09-09 | Disposition: A | Discharge status: Home / Self Care | Payer: Medicare Other | Source: Ambulatory Visit | Attending: Radiation Oncology | Admitting: Radiation Oncology

## 2017-09-09 ENCOUNTER — Other Ambulatory Visit: Payer: Self-pay

## 2017-09-09 DIAGNOSIS — Z434 Encounter for attention to other artificial openings of digestive tract: Secondary | ICD-10-CM | POA: Diagnosis not present

## 2017-09-09 DIAGNOSIS — D649 Anemia, unspecified: Secondary | ICD-10-CM

## 2017-09-09 DIAGNOSIS — C160 Malignant neoplasm of cardia: Secondary | ICD-10-CM | POA: Diagnosis not present

## 2017-09-09 DIAGNOSIS — K9423 Gastrostomy malfunction: Secondary | ICD-10-CM | POA: Diagnosis not present

## 2017-09-09 HISTORY — PX: IR REPLC DUODEN/JEJUNO TUBE PERCUT W/FLUORO: IMG2334

## 2017-09-09 LAB — CBC
HEMATOCRIT: 22.6 % — AB (ref 39.0–52.0)
HEMATOCRIT: 22.7 % — AB (ref 39.0–52.0)
HEMOGLOBIN: 7.5 g/dL — AB (ref 13.0–17.0)
Hemoglobin: 7.3 g/dL — ABNORMAL LOW (ref 13.0–17.0)
MCH: 32.3 pg (ref 26.0–34.0)
MCH: 31.2 pg (ref 26.0–34.0)
MCHC: 33.2 g/dL (ref 30.0–36.0)
MCHC: 32.2 g/dL (ref 30.0–36.0)
MCV: 97.4 fL (ref 78.0–100.0)
MCV: 97 fL (ref 78.0–100.0)
PLATELETS: 372 10*3/uL (ref 150–400)
Platelets: 340 10*3/uL (ref 150–400)
RBC: 2.32 MIL/uL — AB (ref 4.22–5.81)
RBC: 2.34 MIL/uL — ABNORMAL LOW (ref 4.22–5.81)
RDW: 18.6 % — ABNORMAL HIGH (ref 11.5–15.5)
RDW: 18.8 % — AB (ref 11.5–15.5)
WBC: 4.8 10*3/uL (ref 4.0–10.5)
WBC: 3.7 10*3/uL — ABNORMAL LOW (ref 4.0–10.5)

## 2017-09-09 LAB — COMPREHENSIVE METABOLIC PANEL
ALBUMIN: 3 g/dL — AB (ref 3.5–5.0)
ALT: 12 U/L — ABNORMAL LOW (ref 17–63)
ANION GAP: 9 (ref 5–15)
AST: 16 U/L (ref 15–41)
Alkaline Phosphatase: 86 U/L (ref 38–126)
BILIRUBIN TOTAL: 0.7 mg/dL (ref 0.3–1.2)
BUN: 24 mg/dL — ABNORMAL HIGH (ref 6–20)
CO2: 33 mmol/L — ABNORMAL HIGH (ref 22–32)
Calcium: 9.3 mg/dL (ref 8.9–10.3)
Chloride: 102 mmol/L (ref 101–111)
Creatinine, Ser: 0.59 mg/dL — ABNORMAL LOW (ref 0.61–1.24)
Glucose, Bld: 95 mg/dL (ref 65–99)
Potassium: 4.3 mmol/L (ref 3.5–5.1)
Sodium: 144 mmol/L (ref 135–145)
TOTAL PROTEIN: 5.9 g/dL — AB (ref 6.5–8.1)

## 2017-09-09 LAB — MRSA PCR SCREENING: MRSA by PCR: POSITIVE — AB

## 2017-09-09 LAB — PREPARE RBC (CROSSMATCH)

## 2017-09-09 LAB — RETICULOCYTES
RBC.: 2.33 MIL/uL — ABNORMAL LOW (ref 4.22–5.81)
RETIC CT PCT: 1.7 % (ref 0.4–3.1)
Retic Count, Absolute: 39.6 10*3/uL (ref 19.0–186.0)

## 2017-09-09 LAB — FERRITIN: Ferritin: 649 ng/mL — ABNORMAL HIGH (ref 24–336)

## 2017-09-09 LAB — IRON AND TIBC
IRON: 30 ug/dL — AB (ref 45–182)
Saturation Ratios: 13 % — ABNORMAL LOW (ref 17.9–39.5)
TIBC: 235 ug/dL — AB (ref 250–450)
UIBC: 205 ug/dL

## 2017-09-09 LAB — FOLATE: Folate: 49.5 ng/mL (ref >5.9)

## 2017-09-09 LAB — VITAMIN B12: VITAMIN B 12: 194 pg/mL (ref 180–914)

## 2017-09-09 MED ORDER — ALBUTEROL SULFATE (2.5 MG/3ML) 0.083% IN NEBU
2.5000 mg | INHALATION_SOLUTION | Freq: Four times a day (QID) | RESPIRATORY_TRACT | Status: DC | PRN
Start: 2017-09-09 — End: 2017-09-14

## 2017-09-09 MED ORDER — ZOLPIDEM TARTRATE 5 MG PO TABS: 5.0000 mg | ORAL_TABLET | Freq: Every evening | ORAL | Status: DC | PRN

## 2017-09-09 MED ORDER — SODIUM CHLORIDE 0.9 % IV SOLN: INTRAVENOUS | Status: DC

## 2017-09-09 MED ORDER — ALBUTEROL SULFATE HFA 108 (90 BASE) MCG/ACT IN AERS: 2.0000 | INHALATION_SPRAY | Freq: Four times a day (QID) | RESPIRATORY_TRACT | Status: DC | PRN

## 2017-09-09 MED ORDER — METOPROLOL TARTRATE 5 MG/5ML IV SOLN: 5.0000 mg | INTRAVENOUS | Status: DC | PRN

## 2017-09-09 MED ORDER — MOMETASONE FURO-FORMOTEROL FUM 200-5 MCG/ACT IN AERO
2.0000 | INHALATION_SPRAY | Freq: Two times a day (BID) | RESPIRATORY_TRACT | Status: DC
  Administered 2017-09-09 – 2017-09-14 (×12): 2 via RESPIRATORY_TRACT
  Filled 2017-09-09: qty 8.8

## 2017-09-09 MED ORDER — ACETAMINOPHEN 650 MG RE SUPP: 650.0000 mg | Freq: Four times a day (QID) | RECTAL | Status: DC | PRN

## 2017-09-09 MED ORDER — ACETAMINOPHEN 325 MG PO TABS: 650.0000 mg | ORAL_TABLET | Freq: Four times a day (QID) | ORAL | Status: DC | PRN

## 2017-09-09 MED ORDER — ENOXAPARIN SODIUM 40 MG/0.4ML ~~LOC~~ SOLN
40.0000 mg | Freq: Every day | SUBCUTANEOUS | Status: DC
  Administered 2017-09-09 – 2017-09-13 (×6): 40 mg via SUBCUTANEOUS
  Filled 2017-09-09 (×6): qty 0.4

## 2017-09-09 MED ORDER — OXYCODONE HCL 5 MG PO TABS: 10.0000 mg | ORAL_TABLET | ORAL | Status: DC | PRN

## 2017-09-09 MED ORDER — HYDROMORPHONE HCL 1 MG/ML IJ SOLN: 0.5000 mg | INTRAMUSCULAR | Status: DC | PRN

## 2017-09-09 MED ORDER — HYDRALAZINE HCL 20 MG/ML IJ SOLN: 10.0000 mg | Freq: Four times a day (QID) | INTRAMUSCULAR | Status: DC | PRN

## 2017-09-09 MED ORDER — CHLORHEXIDINE GLUCONATE 0.12 % MT SOLN
15.0000 mL | Freq: Two times a day (BID) | OROMUCOSAL | Status: DC
  Administered 2017-09-09 – 2017-09-14 (×10): 15 mL via OROMUCOSAL
  Filled 2017-09-09 (×9): qty 15

## 2017-09-09 MED ORDER — OSMOLITE 1.2 CAL PO LIQD
1000.0000 mL | ORAL | Status: DC
  Administered 2017-09-09 – 2017-09-13 (×4): 1000 mL

## 2017-09-09 MED ORDER — PRO-STAT SUGAR FREE PO LIQD
30.0000 mL | Freq: Two times a day (BID) | ORAL | Status: DC
  Administered 2017-09-10 – 2017-09-14 (×8): 30 mL via ORAL
  Filled 2017-09-09 (×7): qty 30

## 2017-09-09 MED ORDER — SODIUM CHLORIDE 0.9% FLUSH
3.0000 mL | Freq: Two times a day (BID) | INTRAVENOUS | Status: DC
  Administered 2017-09-09 – 2017-09-14 (×5): 3 mL via INTRAVENOUS

## 2017-09-09 MED ORDER — AMLODIPINE BESYLATE 10 MG PO TABS: 10.0000 mg | ORAL_TABLET | Freq: Every day | ORAL | Status: DC

## 2017-09-09 MED ORDER — SODIUM CHLORIDE 0.9% FLUSH: 3.0000 mL | INTRAVENOUS | Status: DC | PRN

## 2017-09-09 MED ORDER — HYDROCODONE-ACETAMINOPHEN 5-325 MG PO TABS: 1.0000 | ORAL_TABLET | Freq: Four times a day (QID) | ORAL | Status: DC | PRN

## 2017-09-09 MED ORDER — SODIUM CHLORIDE 0.9 % IV SOLN
INTRAVENOUS | Status: AC
  Administered 2017-09-09 (×2): via INTRAVENOUS

## 2017-09-09 MED ORDER — SODIUM CHLORIDE 0.9 % IV SOLN
Freq: Once | INTRAVENOUS | Status: AC
  Administered 2017-09-09: 21:00:00 via INTRAVENOUS

## 2017-09-09 MED ORDER — SODIUM CHLORIDE 0.9 % IV SOLN: 250.0000 mL | INTRAVENOUS | Status: DC | PRN

## 2017-09-09 MED ORDER — LIDOCAINE VISCOUS 2 % MT SOLN
OROMUCOSAL | Status: AC
  Filled 2017-09-09: qty 15

## 2017-09-09 MED ORDER — LIDOCAINE HCL 1 % IJ SOLN
INTRAMUSCULAR | Status: AC
  Filled 2017-09-09: qty 20

## 2017-09-09 MED ORDER — ONDANSETRON HCL 4 MG/2ML IJ SOLN
4.0000 mg | Freq: Four times a day (QID) | INTRAMUSCULAR | Status: DC | PRN
  Administered 2017-09-09 – 2017-09-13 (×3): 4 mg via INTRAVENOUS
  Filled 2017-09-09 (×3): qty 2

## 2017-09-09 MED ORDER — IOPAMIDOL (ISOVUE-300) INJECTION 61%
INTRAVENOUS | Status: AC
  Administered 2017-09-09: 5 mL
  Filled 2017-09-09: qty 50

## 2017-09-09 MED ORDER — LIDOCAINE VISCOUS 2 % MT SOLN
OROMUCOSAL | Status: DC | PRN
  Administered 2017-09-09: 5 mL via OROMUCOSAL

## 2017-09-09 MED ORDER — ORAL CARE MOUTH RINSE
15.0000 mL | Freq: Two times a day (BID) | OROMUCOSAL | Status: DC
  Administered 2017-09-13 – 2017-09-14 (×3): 15 mL via OROMUCOSAL

## 2017-09-09 MED ORDER — IOPAMIDOL (ISOVUE-300) INJECTION 61%
50.0000 mL | Freq: Once | INTRAVENOUS | Status: AC | PRN
  Administered 2017-09-09: 5 mL

## 2017-09-09 MED ORDER — HEPARIN SOD (PORK) LOCK FLUSH 100 UNIT/ML IV SOLN: 250.0000 [IU] | Freq: Once | INTRAVENOUS | Status: DC | PRN

## 2017-09-09 NOTE — ED Notes (Signed)
ED TO INPATIENT HANDOFF REPORT  Name/Age/Gender George Stokes 72 y.o. male  Code Status Code Status History    Date Active Date Inactive Code Status Order ID Comments User Context   07/20/2017 19:48 08/02/2017 20:45 Full Code [id number]  Reyne Dumas, MD Inpatient   07/15/2017 18:34 07/20/2017 19:48 Full Code [id number]  Fanny Skates, MD Inpatient   07/08/2017 09:54 07/15/2017 18:33 Full Code [id number]  Phillips Grout, MD ED      Home/SNF/Other Nursing Home  Chief Complaint j tube out  Level of Care/Admitting Diagnosis ED Disposition    ED Disposition Condition [place]: [facility] [100102]  Level of Care: Med-Surg [16]  Diagnosis: Malfunction of gastrostomy tube [facility]) [248250]  Admitting Physician: Jani Gravel [[id number]]  Attending Physician: Jani Gravel [[id number]]  PT Class (Do Not Modify): Observation [104]  PT Acc Code (Do Not Modify): Observation [10022]       Medical History Past Medical History:  Diagnosis Date  . COPD (chronic obstructive pulmonary disease) ([place])   . Dyspnea   . esophageal ca dx'd 07/13/17   mass at GE junction  . Esophageal mass   . Gout   . Heart murmur    during teenage years   . Hypertension   . On home oxygen therapy    1-2 L/M   . Pneumonia     Allergies No Known Allergies  IV Location/Drains/Wounds Patient Lines/Drains/Airways Status   Active Line/Drains/Airways    Name:   Placement date:   Placement time:   Site:   Days:   Gastrostomy/Enterostomy Jejunostomy 16 Fr. LUQ   07/15/17    1649    LUQ   56   Incision (Closed) 07/20/17 Abdomen Other (Comment)   07/20/17    1756     51   Pressure Injury 07/27/17 Deep Tissue Injury - Purple or maroon localized area of discolored intact skin or blood-filled blister due to damage of underlying soft tissue from pressure and/or shear.   07/27/17    1501     44          Labs/Imaging Results for orders placed or performed during the hospital  encounter of 09/08/17 (from the past 48 hour(s))  CBC     Status: Abnormal   Collection Time: 09/08/17 10:02 PM  Result Value Ref Range   WBC 6.1 4.0 - 10.5 K/uL   RBC 2.83 (L) 4.22 - 5.81 MIL/uL   Hemoglobin 9.0 (L) 13.0 - 17.0 g/dL   HCT 27.4 (L) 39.0 - 52.0 %   MCV 96.8 78.0 - 100.0 fL   MCH 31.8 26.0 - 34.0 pg   MCHC 32.8 30.0 - 36.0 g/dL   RDW 18.4 (H) 11.5 - 15.5 %   Platelets 379 150 - 400 K/uL    Comment: Performed at [facility], [place] [address], [place], [place] [address]  Basic metabolic panel     Status: Abnormal   Collection Time: 09/08/17 10:02 PM  Result Value Ref Range   Sodium 141 135 - 145 mmol/L   Potassium 4.2 3.5 - 5.1 mmol/L   Chloride 99 (L) 101 - 111 mmol/L   CO2 32 22 - 32 mmol/L   Glucose, Bld 92 65 - 99 mg/dL   BUN 22 (H) 6 - 20 mg/dL   Creatinine, Ser 0.64 0.61 - 1.24 mg/dL   Calcium 9.8 8.9 - 10.3 mg/dL   GFR calc non Af Amer >60 >60 mL/min  GFR calc Af Amer >60 >60 mL/min    Comment: (NOTE) The eGFR has been calculated using the CKD EPI equation. This calculation has not been validated in all clinical situations. eGFR's persistently <60 mL/min signify possible Chronic Kidney Disease.    Anion gap 10 5 - 15    Comment: Performed at [facility], [place] [address], [place], [place] [address]   No results found.  Pending Labs Unresulted Labs (From admission, onward)   None      Vitals/Pain Today's Vitals   09/08/17 1953 09/08/17 1954 09/08/17 2004 09/08/17 2336  BP: 128/77   128/84  Pulse: 85   86  Resp: 16   18  Temp: 98.3 F (36.8 C)     TempSrc: Oral     SpO2: 100%   99%  Weight:   115 lb (52.2 kg)   Height:   '5\' 7"'$  (1.702 m)   PainSc:  0-No pain      Isolation Precautions No active isolations  Medications Medications - No data to display  Mobility manual wheelchair

## 2017-09-09 NOTE — Procedures (Signed)
  Procedure: Replace 73f J tube under fluoro   EBL:   minimal Complications:  none immediate  See full dictation in [facility].  Dillard Cannon MD Main # [phone] Pager  [phone]

## 2017-09-09 NOTE — Progress Notes (Addendum)
Initial Nutrition Assessment  DOCUMENTATION CODES:   Severe malnutrition in context of chronic illness  INTERVENTION:   When medically appropriate after J-tube is re-placed and TF able to be re-started, recommend Osmolite 1.5 @ 15 mL/hr and advance by 10 mL every 24 hours to reach goal rate of Osmolite 1.5 @ 55 mL/hr.   30 mL Prostat QID, provides 400 calories, 60 grams of protein.  At goal rate, this TF regimen will provide 2380 kcal (103% upper end of estimated kcal need), 143 grams of protein (100% estimated protein needs), and 1003 mL free water.  Patient at risk of refeeding syndrome. Once TF begins, please monitor magnesium, potassium, and phosphorus daily for at least 3 days. MD to replete as needed.   NUTRITION DIAGNOSIS:   Severe Malnutrition related to cancer and cancer related treatments as evidenced by severe muscle depletion, severe fat depletion, percent weight loss.   GOAL:   Patient will meet greater than or equal to 90% of their needs   MONITOR:   Weight trends, TF tolerance, Labs, Skin  REASON FOR ASSESSMENT:   Malnutrition Screening Tool    ASSESSMENT:   72 yo male with PCM, COPD on O2 1-2L, anemia, Stg II pressure injuries, and esophageal cancer (on chemo/radiation) admitted from SNF on 3/14 to re-place a malfunctioning gastrostomy tube. PMH HTN, Gout.  Spoke with pt who was agitated and poor historian. Pt reports he has lost a significant amount of weight since his diagnosis and initiation of cancer therapy.  He does not know how much weight he has lost.  Per pt, he has been "getting food through an IV" for a week PTA He does not manage his own J-tube feedings, so he was not able to provide information about the frequency of his feedings Pt reported that he is thirsty and asked for water. Per SLP eval, pt has complete obstruction of distal esophagus and not appropriate for any PO intake.  Pt has lost a significant amount of weight:  16% in past 6  weeks 4% in past 10 days  Wt Readings from Last 15 Encounters:  09/09/17 110 lb 10.7 oz (50.2 kg)  08/30/17 115 lb 4.8 oz (52.3 kg)  08/10/17 119 lb 8 oz (54.2 kg)  08/02/17 131 lb (59.4 kg)  06/29/17 126 lb (57.2 kg)  01/04/17 160 lb 12.8 oz (72.9 kg)  12/31/16 159 lb 6.4 oz (72.3 kg)  09/17/16 165 lb (74.8 kg)  05/03/16 168 lb 9.6 oz (76.5 kg)  01/13/16 163 lb 6.4 oz (74.1 kg)  11/27/15 178 lb (80.7 kg)  05/01/15 172 lb (78 kg)  09/24/14 160 lb (72.6 kg)  08/16/14 153 lb 12.8 oz (69.8 kg)  05/03/14 156 lb (70.8 kg)    Medications: Continuous NaCL @ 128mL/hr  Labs: K 4.3 WNL Mg/Phos not available   NUTRITION - FOCUSED PHYSICAL EXAM:    Most Recent Value  Orbital Region  Severe depletion  Upper Arm Region  Severe depletion  Thoracic and Lumbar Region  Severe depletion  Buccal Region  Severe depletion  Temple Region  Severe depletion  Clavicle Bone Region  Severe depletion  Clavicle and Acromion Bone Region  Severe depletion  Scapular Bone Region  Severe depletion  Dorsal Hand  Severe depletion  Patellar Region  Severe depletion  Anterior Thigh Region  Severe depletion  Posterior Calf Region  Severe depletion  Edema (RD Assessment)  None  Hair  Reviewed  Eyes  Reviewed  Mouth  Reviewed  Skin  Other (  Comment) [petechia BLE]  Nails  Reviewed       Diet Order:  Diet NPO time specified Except for: Sips with Meds  EDUCATION NEEDS:   No education needs have been identified at this time  Skin:  Skin Assessment: Skin Integrity Issues: Skin Integrity Issues:: Stage II, DTI DTI: sacrum Stage II: sacrum; lt buttock  Last BM:  3/14  Height:   Ht Readings from Last 1 Encounters:  09/09/17 5\' 7"  (1.702 m)    Weight:   Wt Readings from Last 1 Encounters:  09/09/17 110 lb 10.7 oz (50.2 kg)    Ideal Body Weight:  67.2 kg  BMI:  Body mass index is 17.33 kg/m.  Estimated Nutritional Needs:   Kcal:  2100-2300 kcal (43-46 kcal/kg ABW)  Protein:   136-149 grams (2.3-2.5 g/kg repletion cachexia)  Fluid:  >2.1 L/d    Edmonia Lynch Dietetic Intern Pager: [phone]

## 2017-09-09 NOTE — Clinical Social Work Note (Signed)
Clinical Social Work Assessment  Patient Details  Name: Margues Filippini MRN: [id number] Date of Birth: 1945/11/14  Date of referral:  09/09/17               Reason for consult:  (pt admitted from facility)                Permission sought to share information with:  Family Supports, Chartered certified accountant granted to share information::  Yes, Verbal Permission Granted  Name::     Brother [name] (POA), brother-in-law Advertising account planner::  [facility]  Relationship::     Contact Information:     Housing/Transportation Living arrangements for the past 2 months:  Apartment(but has been at [facility] for rehab x 1.5 mo) Source of Information:  Patient, Facility, Medical laboratory scientific officer Patient Interpreter Needed:  None Criminal Activity/Legal Involvement Pertinent to Current Situation/Hospitalization:  No - Comment as needed Significant Relationships:  Other Family Members, Siblings Lives with:  Self Do you feel safe going back to the place where you live?  Yes Need for family participation in patient care:  No (Coment)(pt is own decision maker but consents to brother participating/assisting)  Care giving concerns:  Pt lives independently in an apartment but has been at [facility] for rehab since 08/02/17. He is receiving radiation and chemotherapy for esophogeal cancer as outpatient at [facility] (last radiation 3/18, states his next chemotherapy is 09/20/17). Facility assists him with transportation to appointments. Pt current admitted under observation for replacement of J tube.    Social Worker assessment / plan:  CSW following to assist with disposition as pt is admitted from facility- [facility].  Met with pt at bedside with his brother (POA) particpating in assessment via phone. Pt and brother (in [place]) known to [place] from previous admission. Pt was DC'd from [facility] 08/02/17 to enter SNF and plans to return upon DC as well. CSW spoke with facility and brother reports he called to hold bed  for pt's return. Will complete updated FL2 and assist with pt returning to [facility] once stable (hopeful to DC tomorrow 09/10/17).    Employment status:  Retired Forensic scientist:  Medicare PT Recommendations:  Not assessed at this time Information / Referral to community resources:     Patient/Family's Response to care:  Pt appreciative of care  Patient/Family's Understanding of and Emotional Response to Diagnosis, Current Treatment, and Prognosis:  Pt demonstrates very good understanding of his treatment, describing in detail to brother on the phone. Also is very reasonable re: his goals for rehab -"keep getting as strong as I can" Emotionally pt seemed well-adjusted. Was more interactive than last encounter with CSW last month. Also states he was looking forward to [place] from hospital and returning to [facility]  Emotional Assessment Appearance:    Attitude/Demeanor/Rapport:  Engaged Affect (typically observed):  Pleasant, Accepting, Calm Orientation:  Oriented to Self, Oriented to Place, Oriented to  Time, Oriented to Situation Alcohol / Substance use:  Not Applicable Psych involvement (Current and /or in the community):  No (Comment)  Discharge Needs  Concerns to be addressed:  Care Coordination Readmission within the last 30 days:  No Current discharge risk:  None Barriers to Discharge:  Continued Medical Work up   [employer], LCSW 09/09/2017, 10:16 PM  [phone]

## 2017-09-09 NOTE — Progress Notes (Signed)
PROGRESS NOTE    Teandre Mccracken   VOJ:[id number]  DOB: 10-Sep-1945  DOA: 09/08/2017 PCP: Patient, No Pcp Per   Brief Narrative:  [name]  Bishara Rideaux  is a 72 y.o. male with COPD, Gout, Esophageal cancer with esophageal obstruction with PEG tube. He comes in because the J came out. Patient states he did not pull it out.   Subjective: No complaints.  ROS: no complaints of nausea, vomiting, constipation diarrhea, cough, dyspnea or dysuria. No other complaints.   Assessment & Plan:   Principal Problem:   Malfunction of J tube  - has complete obstruction of lower esophagus and unable to eat - all oral meds on hold- replace with IV meds where possible - dietician note mentions to watch for re-feeding syndrome however, he was receiving tube feeds up until yesterday and I am doubtful there will be a refeeding syndrome - Addendum - Received J tube around 4 PM- start tube feeds at 55 cc /hr  Active Problems:    Malignant neoplasm of lower third of esophagus - chemo per Dr Truett Perna    Anemia of acute illness - possibly also bleeding from esophageal cancer B12 deficiency - Hb 7.3 today (has been rechecked) - Hb was 9.0 on 3/14 and 11.2 on 2/13 - will give a unit of blood today- d/w patient and he is in agreement - low B12- replace    Essential hypertension - Amlodipine and metoprolol on hold as BP is normal - IV Metoprolol PRN    COPD  - Chronic respiratory failure with hypoxia  - cont chronic O2- lungs quite clear on exam- cont Dulera   GERD? - Zantac on hold   DVT prophylaxis:  Lovenox Code Status: Full code Family Communication:  Disposition Plan:  J  tube just placed- resume tube feeds - can be discharged tommorow Consultants:   IR Procedures:   J tube placement Antimicrobials:  Anti-infectives (From admission, onward)   None       Objective: Vitals:   09/08/17 2004 09/08/17 2336 09/09/17 0047 09/09/17 0426  BP:  128/84 120/75 120/75  Pulse:  86 89 88   Resp:  18 20 20   Temp:   97.6 F (36.4 C) 97.8 F (36.6 C)  TempSrc:   Oral Oral  SpO2:  99% 100% 99%  Weight: 52.2 kg (115 lb)  50.2 kg (110 lb 10.7 oz)   Height: 5\' 7"  (1.702 m)  5\' 7"  (1.702 m)     Intake/Output Summary (Last 24 hours) at 09/09/2017 1459 Last data filed at 09/09/2017 1015 Gross per 24 hour  Intake 226.25 ml  Output 300 ml  Net -73.75 ml   Filed Weights   09/08/17 2004 09/09/17 0047  Weight: 52.2 kg (115 lb) 50.2 kg (110 lb 10.7 oz)    Examination: General exam: Appears comfortable  HEENT: PERRLA, oral mucosa moist, no sclera icterus or thrush Respiratory system: Clear to auscultation. Respiratory effort normal. Cardiovascular system: S1 & S2 heard, RRR.  No murmurs  Gastrointestinal system: Abdomen soft, non-tender, nondistended. Normal bowel sound. No organomegaly Central nervous system: Alert and oriented. No focal neurological deficits. Extremities: No cyanosis, clubbing or edema Skin: No rashes or ulcers Psychiatry:  Mood & affect appropriate.     Data Reviewed: I have personally reviewed following labs and imaging studies  CBC: Recent Labs  Lab 09/08/17 2202 09/09/17 0400 09/09/17 1011  WBC 6.1 4.8 3.7*  HGB 9.0* 7.5* 7.3*  HCT 27.4* 22.6* 22.7*  MCV 96.8 97.4 97.0  PLT 379 340 372   Basic Metabolic Panel: Recent Labs  Lab 09/08/17 2202 09/09/17 0400  NA 141 144  K 4.2 4.3  CL 99* 102  CO2 32 33*  GLUCOSE 92 95  BUN 22* 24*  CREATININE 0.64 0.59*  CALCIUM 9.8 9.3   GFR: Estimated Creatinine Clearance: 61.9 mL/min (A) (by C-G formula based on SCr of 0.59 mg/dL (L)). Liver Function Tests: Recent Labs  Lab 09/09/17 0400  AST 16  ALT 12*  ALKPHOS 86  BILITOT 0.7  PROT 5.9*  ALBUMIN 3.0*   No results for input(s): LIPASE, AMYLASE in the last 168 hours. No results for input(s): AMMONIA in the last 168 hours. Coagulation Profile: No results for input(s): INR, PROTIME in the last 168 hours. Cardiac Enzymes: No results  for input(s): CKTOTAL, CKMB, CKMBINDEX, TROPONINI in the last 168 hours. BNP (last 3 results) No results for input(s): PROBNP in the last 8760 hours. HbA1C: No results for input(s): HGBA1C in the last 72 hours. CBG: No results for input(s): GLUCAP in the last 168 hours. Lipid Profile: No results for input(s): CHOL, HDL, LDLCALC, TRIG, CHOLHDL, LDLDIRECT in the last 72 hours. Thyroid Function Tests: No results for input(s): TSH, T4TOTAL, FREET4, T3FREE, THYROIDAB in the last 72 hours. Anemia Panel: Recent Labs    09/09/17 0942 09/09/17 1011  VITAMINB12 194  --   FOLATE  --  49.5  FERRITIN  --  649*  TIBC 235*  --   IRON 30*  --   RETICCTPCT 1.7  --    Urine analysis:    Component Value Date/Time   COLORURINE YELLOW 07/07/2017 2005   APPEARANCEUR CLEAR 07/07/2017 2005   LABSPEC 1.018 07/07/2017 2005   PHURINE 5.0 07/07/2017 2005   GLUCOSEU NEGATIVE 07/07/2017 2005   HGBUR NEGATIVE 07/07/2017 2005   BILIRUBINUR NEGATIVE 07/07/2017 2005   KETONESUR 20 (A) 07/07/2017 2005   PROTEINUR NEGATIVE 07/07/2017 2005   NITRITE NEGATIVE 07/07/2017 2005   LEUKOCYTESUR TRACE (A) 07/07/2017 2005   Sepsis Labs: @LABRCNTIP (procalcitonin:4,lacticidven:4) ) Recent Results (from the past 240 hour(s))  MRSA PCR Screening     Status: Abnormal   Collection Time: 09/09/17  1:28 AM  Result Value Ref Range Status   MRSA by PCR POSITIVE (A) NEGATIVE Final    Comment:        The GeneXpert MRSA Assay (FDA approved for NASAL specimens only), is one component of a comprehensive MRSA colonization surveillance program. It is not intended to diagnose MRSA infection nor to guide or monitor treatment for MRSA infections. RESULT CALLED TO, READ BACK BY AND VERIFIED WITH: Carmela Rima RN [id number] 09/09/17 A NAVARRO Performed at [facility], [address] [place], [place] [address]          Radiology Studies: No results found.    Scheduled Meds: . chlorhexidine  15 mL  Mouth Rinse BID  . enoxaparin (LOVENOX) injection  40 mg Subcutaneous QHS  . lidocaine      . mouth rinse  15 mL Mouth Rinse q12n4p  . mometasone-formoterol  2 puff Inhalation BID  . sodium chloride flush  3 mL Intravenous Q12H   Continuous Infusions: . sodium chloride    . sodium chloride 100 mL/hr at 09/09/17 1027     LOS: 0 days    Time spent in minutes: 35    Calvert Cantor, MD [facility] Pager: [email or web address] Password [facility] 09/09/2017, 2:59 PM

## 2017-09-09 NOTE — Evaluation (Signed)
SLP Cancellation Note  Patient Details Name: Savir Blanke MRN: [id number] DOB: [birth year]   Cancelled treatment:       Reason Eval/Treat Not Completed: Other (comment)(order for evaluation received, pt familiar to this SlP, although pt only had mild pharyngeal dysphagia on recent MBS - he had complete obstruction of distal esophagus on recent esophagram 09/05/2017 and thus is likely not appropriate for po, will sign off, spoke to referring MD who agreed with plan, thanks.)   Claudie Fisherman, [facility] SLP [phone]

## 2017-09-09 NOTE — H&P (Signed)
TRH H&P   Patient Demographics:    Bon Reason, is a 72 y.o. male  MRN: [id number]   DOB - Nov 17, 1945  Admit Date - 09/08/2017  Outpatient Primary MD for the patient is Patient, No Pcp Per  Referring MD/NP/PA: [place] Robinson  Outpatient Specialists:    Patient coming from:  [facility]  Chief Complaint  Patient presents with  . J-tube need to be replace.      HPI:    Daniels Thoma  is a 72 y.o. male, w  Copd, Esophageal cancer apparently had G tube fall out. Sent to ED for evaluation.    Prior Esophagram 09/05/2017 IMPRESSION: Complete obstruction within the distal esophagus.  In ED,  Wbc 6.1, hgb 9.0, Plt 379 Na 141, K 4.2, Bun 22, Creatinine 0.64  ED attempted to put back G tube but unable  Pt will be admitted for G tube malfunction   Review of systems:    In addition to the HPI above,  No Fever-chills, No Headache, No changes with Vision or hearing, No problems swallowing food or Liquids, No Chest pain, Cough or Shortness of Breath, No Abdominal pain, No Nausea or Vommitting, Bowel movements are regular, No Blood in stool or Urine, No dysuria, No new skin rashes or bruises, No new joints pains-aches,  No new weakness, tingling, numbness in any extremity, No recent weight gain or loss, No polyuria, polydypsia or polyphagia, No significant Mental Stressors.  A full 10 point Review of Systems was done, except as stated above, all other Review of Systems were negative.   With Past History of the following :    Past Medical History:  Diagnosis Date  . COPD (chronic obstructive pulmonary disease) (HCC)   . Dyspnea   . esophageal ca dx'd 07/13/17   mass at GE junction  . Esophageal mass   . Gout   . Heart murmur    during teenage years   . Hypertension   . On home oxygen therapy    1-2 L/M   . Pneumonia       Past Surgical History:    Procedure Laterality Date  . ESOPHAGOGASTRODUODENOSCOPY (EGD) WITH PROPOFOL N/A 07/13/2017   Procedure: ESOPHAGOGASTRODUODENOSCOPY (EGD) WITH PROPOFOL;  Surgeon: Napoleon Form, MD;  Location: [facility];  Service: Endoscopy;  Laterality: N/A;  . IR REPLC DUODEN/JEJUNO TUBE PERCUT W/FLUORO  08/16/2017  . LAPAROTOMY N/A 07/15/2017   Procedure: FEEDING JEJUNOSTOMY TUBE PLACEMENT;  Surgeon: Claud Kelp, MD;  Location: [facility] ORS;  Service: General;  Laterality: N/A;  . LAPAROTOMY N/A 07/20/2017   Procedure: EXPLORATORY LAPAROTOMY; REVISION OF JEJUNOSTOMY;  Surgeon: Glenna Fellows, MD;  Location: [facility] ORS;  Service: General;  Laterality: N/A;  . TONSILLECTOMY        Social History:     Social History   Tobacco Use  . Smoking status: Former Smoker    Packs/day: 2.00  Years: 43.00    Pack years: 86.00    Types: Cigarettes    Last attempt to quit: 05/03/2009    Years since quitting: 8.3  . Smokeless tobacco: Never Used  Substance Use Topics  . Alcohol use: Yes    Alcohol/week: 0.0 oz    Comment: vodka daily;1-2 drinks in last 4 weeks      Lives - at [employer] - walks by self   Family History :     Family History  Problem Relation Age of Onset  . Dementia Mother   . Cancer Father   . Gout Father   . Thyroid disease Sister   . Kidney disease Brother   . Gout Brother   . Diabetes Maternal Grandmother   . Stroke Maternal Grandfather       Home Medications:   Prior to Admission medications   Medication Sig Start Date End Date Taking? Authorizing Provider  albuterol (PROVENTIL HFA;VENTOLIN HFA) 108 (90 Base) MCG/ACT inhaler Inhale 2 puffs into the lungs every 6 (six) hours as needed for wheezing or shortness of breath. 08/02/17  Yes Emokpae, Courage, MD  amLODipine (NORVASC) 10 MG tablet Take 1 tablet (10 mg total) by mouth daily. 01/04/17  Yes Sagardia, Eilleen Kempf, MD  clotrimazole-betamethasone (LOTRISONE) cream Apply 1 application topically 2 (two)  times daily. 01/04/17  Yes Sagardia, Eilleen Kempf, MD  heparin lock flush 100 UNIT/ML SOLN injection 2.5 mLs (250 Units total) by Intracatheter route once as needed (Hickman or PICC flush). 08/02/17  Yes Shon Hale, MD  HYDROcodone-acetaminophen (NORCO/VICODIN) 5-325 MG tablet Take 1 tablet by mouth every 6 (six) hours as needed for severe pain. 08/02/17  Yes Emokpae, Courage, MD  indomethacin (INDOCIN) 25 MG capsule Take 25 mg by mouth daily as needed for mild pain.   Yes [provider]  Nutritional Supplements (FEEDING SUPPLEMENT, OSMOLITE 1.5 CAL,) LIQD 60 mL an hour via PEG tube continuously--with water flushes 50 mL every 4 hours 08/02/17  Yes Emokpae, Courage, MD  ondansetron (ZOFRAN) 8 MG tablet Take 1 tablet (8 mg total) by mouth every 8 (eight) hours. 09/08/17  Yes Ronny Bacon, PA-C  Oxycodone HCl 10 MG TABS Take 1 tablet (10 mg total) by mouth every 4 (four) hours as needed for severe pain. 08/02/17  Yes Shon Hale, MD  SYMBICORT 160-4.5 MCG/ACT inhaler Inhale 2 puffs into the lungs 2 (two) times daily. 01/24/17  Yes Nyoka Cowden, MD  zolpidem (AMBIEN) 5 MG tablet Take 1 tablet daily at bedtime as needed 05/01/15  Yes Le, Thao P, DO  ipratropium-albuterol (DUONEB) 0.5-2.5 (3) MG/3ML SOLN Take 3 mLs by nebulization 3 (three) times daily. Patient not taking: Reported on 09/08/2017 08/02/17   Shon Hale, MD  metoprolol tartrate (LOPRESSOR) 25 mg/10 mL SUSP Place 10 mLs (25 mg total) into feeding tube 2 (two) times daily. Patient not taking: Reported on 09/08/2017 08/02/17   Shon Hale, MD  Multiple Vitamin (MULTIVITAMIN) LIQD Take 15 mLs by mouth daily. Patient not taking: Reported on 09/08/2017 08/03/17   Shon Hale, MD  ranitidine (ZANTAC) 150 MG/10ML syrup Place 10 mLs (150 mg total) into feeding tube 2 (two) times daily. Patient not taking: Reported on 09/08/2017 08/02/17   Shon Hale, MD  Tiotropium Bromide Monohydrate (SPIRIVA RESPIMAT) 2.5 MCG/ACT  AERS Inhale 2 puffs into the lungs daily. Patient not taking: Reported on 06/29/2017 12/31/16   Nyoka Cowden, MD     Allergies:    No Known Allergies   Physical  Exam:   Vitals  Blood pressure 120/75, pulse 89, temperature 97.6 F (36.4 C), temperature source Oral, resp. rate 20, height 5\' 7"  (1.702 m), weight 50.2 kg (110 lb 10.7 oz), SpO2 100 %.   1. General  lying in bed in NAD,    2. Normal affect and insight, Not Suicidal or Homicidal, Awake Alert, Oriented X 3.  3. No F.N deficits, ALL C.Nerves Intact, Strength 5/5 all 4 extremities, Sensation intact all 4 extremities, Plantars down going.  4. Ears and Eyes appear Normal, Conjunctivae clear, PERRLA. Moist Oral Mucosa.  5. Supple Neck, No JVD, No cervical lymphadenopathy appriciated, No Carotid Bruits.  6. Symmetrical Chest wall movement, Good air movement bilaterally, CTAB.  7. RRR, No Gallops, Rubs or Murmurs, No Parasternal Heave.  8. Positive Bowel Sounds, Abdomen Soft, No tenderness, No organomegaly appriciated,No rebound -guarding or rigidity.  9.  No Cyanosis, Normal Skin Turgor, No Skin Rash or Bruise.  10. Good muscle tone,  joints appear normal , no effusions, Normal ROM.  11. No Palpable Lymph Nodes in Neck or Axillae     Data Review:    CBC Recent Labs  Lab 09/08/17 2202  WBC 6.1  HGB 9.0*  HCT 27.4*  PLT 379  MCV 96.8  MCH 31.8  MCHC 32.8  RDW 18.4*   ------------------------------------------------------------------------------------------------------------------  Chemistries  Recent Labs  Lab 09/08/17 2202  NA 141  K 4.2  CL 99*  CO2 32  GLUCOSE 92  BUN 22*  CREATININE 0.64  CALCIUM 9.8   ------------------------------------------------------------------------------------------------------------------ estimated creatinine clearance is 61.9 mL/min (by C-G formula based on SCr of 0.64  mg/dL). ------------------------------------------------------------------------------------------------------------------ No results for input(s): TSH, T4TOTAL, T3FREE, THYROIDAB in the last 72 hours.  Invalid input(s): FREET3  Coagulation profile No results for input(s): INR, PROTIME in the last 168 hours. ------------------------------------------------------------------------------------------------------------------- No results for input(s): DDIMER in the last 72 hours. -------------------------------------------------------------------------------------------------------------------  Cardiac Enzymes No results for input(s): CKMB, TROPONINI, MYOGLOBIN in the last 168 hours.  Invalid input(s): CK ------------------------------------------------------------------------------------------------------------------ No results found for: BNP   ---------------------------------------------------------------------------------------------------------------  Urinalysis    Component Value Date/Time   COLORURINE YELLOW 07/07/2017 2005   APPEARANCEUR CLEAR 07/07/2017 2005   LABSPEC 1.018 07/07/2017 2005   PHURINE 5.0 07/07/2017 2005   GLUCOSEU NEGATIVE 07/07/2017 2005   HGBUR NEGATIVE 07/07/2017 2005   BILIRUBINUR NEGATIVE 07/07/2017 2005   KETONESUR 20 (A) 07/07/2017 2005   PROTEINUR NEGATIVE 07/07/2017 2005   NITRITE NEGATIVE 07/07/2017 2005   LEUKOCYTESUR TRACE (A) 07/07/2017 2005    ----------------------------------------------------------------------------------------------------------------   Imaging Results:    No results found.   Assessment & Plan:    Principal Problem:   Malfunction of gastrostomy tube (HCC) Active Problems:   Anemia    Malfunction of gastrostomy tube NPO IR consult to replace in AM  Anemia Check cbc in am  Hypertension HOLD Amlodipine HOLD Metoprolol Hydralazine 10mg  iv q6h prn   Copd Cont Symbicort=> Dulera Cont Albuterol  HFA  Esophageal cancer    DVT Prophylaxis Lovenox - SCDs  AM Labs Ordered, also please review Full Orders  Family Communication: Admission, patients condition and plan of care including tests being ordered have been discussed with the patient  who indicate understanding and agree with the plan and Code Status.  Code Status FULL CODE  Likely DC to  [facility]  Condition GUARDED    Consults called:    None, please consult IR in am  Admission status: observation  Time spent in minutes : 45   Pearson Grippe  M.D on 09/09/2017 at 1:20 AM  Between 7am to 7pm - Pager - [phone]  . After 7pm go to [email or web address] - password [facility]  [facility] - Office  [phone]

## 2017-09-09 NOTE — NC FL2 (Signed)
[place] MEDICAID FL2 LEVEL OF CARE SCREENING TOOL     IDENTIFICATION  Patient Name: George Stokes Birthdate: [birth year] Sex: male Admission Date (Current Location): 09/08/2017  [facility] and [place] Number:  Producer, television/film/video and Address:  [facility],  [address], [place] [address]      Provider Number: [id number]  Attending Physician Name and Address:  Calvert Cantor, MD  Relative Name and Phone Number:       Current Level of Care: Hospital Recommended Level of Care: Skilled Nursing Facility Prior Approval Number:    Date Approved/Denied:   PASRR Number: [id number]  Discharge Plan: SNF    Current Diagnoses: Patient Active Problem List   Diagnosis Date Noted  . Anemia 09/09/2017  . Malfunction of gastrostomy tube (HCC) 09/08/2017  . Pressure injury of skin 08/01/2017  . Atelectasis of right lung   . H/O exploratory laparotomy 07/20/2017  . Acute respiratory failure (HCC) 07/20/2017  . Malnutrition of moderate degree 07/16/2017  . Malignant neoplasm of lower third of esophagus (HCC)   . Esophageal dysphagia   . Esophageal mass   . Esophageal stricture   . Protein-calorie malnutrition, severe (HCC) 07/12/2017  . Hypomagnesemia   . Hypokalemia   . Lobar pneumonia (HCC)   . Intractable nausea and vomiting 07/08/2017  . CAP (community acquired pneumonia) 07/08/2017  . Acute kidney injury (HCC)   . Intractable vomiting with nausea 06/29/2017  . Sleep disturbance 01/04/2017  . Idiopathic chronic gout of multiple sites without tophus 01/04/2017  . Chronic respiratory failure with hypoxia (HCC) 01/13/2016  . COPD with hypoxia (HCC) 05/03/2014  . Gout 10/30/2011  . Essential hypertension 10/30/2011  . Dyslipidemia 10/30/2011    Orientation RESPIRATION BLADDER Height & Weight     Self, Time, Situation, Place  O2(4L) Continent Weight: 110 lb 10.7 oz (50.2 kg) Height:  5\' 7"  (170.2 cm)  BEHAVIORAL SYMPTOMS/MOOD NEUROLOGICAL BOWEL NUTRITION  STATUS      Continent Feeding tube  Osmolite 1.5 @ 15 mL/hr and advance by 10 mL every 24 hours to reach goal rate of Osmolite 1.5 @ 55 mL/hr    AMBULATORY STATUS COMMUNICATION OF NEEDS Skin   Extensive Assist Verbally PU Stage and Appropriate Care    stage II pressure injury sacrum, foam dressing changes PRN Stage II pressure injury buttocks, foam dressing changes PRN                   Personal Care Assistance Level of Assistance  Dressing, Feeding, Bathing Bathing Assistance: Limited assistance Feeding assistance: Independent(feeding tube) Dressing Assistance: Limited assistance     Functional Limitations Info  Speech, Hearing, Sight Sight Info: Adequate Hearing Info: Adequate Speech Info: Adequate    SPECIAL CARE FACTORS FREQUENCY  PT (By licensed PT), OT (By licensed OT)     PT Frequency: 5x OT Frequency: 5x            Contractures Contractures Info: Not present    Additional Factors Info  Isolation Precautions, Code Status, Allergies Code Status Info: full code Allergies Info: nka     Isolation Precautions Info: contact precautions     Current Medications (09/09/2017):  This is the current hospital active medication list Current Facility-Administered Medications  Medication Dose Route Frequency Provider Last Rate Last Dose  . 0.9 %  sodium chloride infusion  250 mL Intravenous PRN Pearson Grippe, MD      . acetaminophen (TYLENOL) tablet 650 mg  650 mg Oral Q6H PRN Pearson Grippe, MD  Or  . acetaminophen (TYLENOL) suppository 650 mg  650 mg Rectal Q6H PRN Pearson Grippe, MD      . albuterol (PROVENTIL) (2.5 MG/3ML) 0.083% nebulizer solution 2.5 mg  2.5 mg Nebulization Q6H PRN Pearson Grippe, MD      . chlorhexidine (PERIDEX) 0.12 % solution 15 mL  15 mL Mouth Rinse BID Pearson Grippe, MD   15 mL at 09/09/17 2127  . enoxaparin (LOVENOX) injection 40 mg  40 mg Subcutaneous QHS Pearson Grippe, MD   40 mg at 09/09/17 2127  . feeding supplement (OSMOLITE 1.2 CAL) liquid 1,000  mL  1,000 mL Per Tube Continuous Calvert Cantor, MD 55 mL/hr at 09/09/17 1859 1,000 mL at 09/09/17 1859  . feeding supplement (PRO-STAT SUGAR FREE 64) liquid 30 mL  30 mL Oral BID WC Rizwan, Saima, MD      . heparin lock flush 100 unit/mL  250 Units Intracatheter Once PRN Pearson Grippe, MD      . hydrALAZINE (APRESOLINE) injection 10 mg  10 mg Intravenous Q6H PRN Pearson Grippe, MD      . HYDROmorphone (DILAUDID) injection 0.5 mg  0.5 mg Intravenous Q3H PRN Pearson Grippe, MD      . lidocaine (XYLOCAINE) 1 % (with pres) injection           . lidocaine (XYLOCAINE) 2 % viscous mouth solution           . lidocaine (XYLOCAINE) 2 % viscous mouth solution   Mouth/Throat PRN Oley Balm, MD   5 mL at 09/09/17 1533  . MEDLINE mouth rinse  15 mL Mouth Rinse q12n4p Pearson Grippe, MD      . metoprolol tartrate (LOPRESSOR) injection 5 mg  5 mg Intravenous Q4H PRN Calvert Cantor, MD      . mometasone-formoterol (DULERA) 200-5 MCG/ACT inhaler 2 puff  2 puff Inhalation BID Pearson Grippe, MD   2 puff at 09/09/17 2106  . ondansetron (ZOFRAN) injection 4 mg  4 mg Intravenous Q6H PRN Audrea Muscat T, NP   4 mg at 09/09/17 0227  . oxyCODONE (Oxy IR/ROXICODONE) immediate release tablet 10 mg  10 mg Oral Q4H PRN Rizwan, Ladell Heads, MD      . sodium chloride flush (NS) 0.9 % injection 3 mL  3 mL Intravenous Q12H Pearson Grippe, MD   3 mL at 09/09/17 2128  . sodium chloride flush (NS) 0.9 % injection 3 mL  3 mL Intravenous PRN Pearson Grippe, MD         Discharge Medications: Please see discharge summary for a list of discharge medications.  Relevant Imaging Results:  Relevant Lab Results:   Additional Information SSN:[id number]. Radiation appointments at [facility], LCSW

## 2017-09-10 DIAGNOSIS — D001 Carcinoma in situ of esophagus: Secondary | ICD-10-CM | POA: Diagnosis not present

## 2017-09-10 DIAGNOSIS — E441 Mild protein-calorie malnutrition: Secondary | ICD-10-CM | POA: Diagnosis not present

## 2017-09-10 DIAGNOSIS — E538 Deficiency of other specified B group vitamins: Secondary | ICD-10-CM | POA: Diagnosis present

## 2017-09-10 DIAGNOSIS — Z7951 Long term (current) use of inhaled steroids: Secondary | ICD-10-CM | POA: Diagnosis not present

## 2017-09-10 DIAGNOSIS — Z9221 Personal history of antineoplastic chemotherapy: Secondary | ICD-10-CM | POA: Diagnosis not present

## 2017-09-10 DIAGNOSIS — Z87891 Personal history of nicotine dependence: Secondary | ICD-10-CM | POA: Diagnosis not present

## 2017-09-10 DIAGNOSIS — R112 Nausea with vomiting, unspecified: Secondary | ICD-10-CM | POA: Diagnosis present

## 2017-09-10 DIAGNOSIS — M109 Gout, unspecified: Secondary | ICD-10-CM | POA: Diagnosis not present

## 2017-09-10 DIAGNOSIS — C801 Malignant (primary) neoplasm, unspecified: Secondary | ICD-10-CM | POA: Diagnosis not present

## 2017-09-10 DIAGNOSIS — R278 Other lack of coordination: Secondary | ICD-10-CM | POA: Diagnosis not present

## 2017-09-10 DIAGNOSIS — J9611 Chronic respiratory failure with hypoxia: Secondary | ICD-10-CM | POA: Diagnosis not present

## 2017-09-10 DIAGNOSIS — R2689 Other abnormalities of gait and mobility: Secondary | ICD-10-CM | POA: Diagnosis not present

## 2017-09-10 DIAGNOSIS — I10 Essential (primary) hypertension: Secondary | ICD-10-CM | POA: Diagnosis not present

## 2017-09-10 DIAGNOSIS — Z9981 Dependence on supplemental oxygen: Secondary | ICD-10-CM | POA: Diagnosis not present

## 2017-09-10 DIAGNOSIS — E860 Dehydration: Secondary | ICD-10-CM | POA: Diagnosis present

## 2017-09-10 DIAGNOSIS — Z79899 Other long term (current) drug therapy: Secondary | ICD-10-CM | POA: Diagnosis not present

## 2017-09-10 DIAGNOSIS — K9413 Enterostomy malfunction: Secondary | ICD-10-CM | POA: Diagnosis present

## 2017-09-10 DIAGNOSIS — J189 Pneumonia, unspecified organism: Secondary | ICD-10-CM | POA: Diagnosis not present

## 2017-09-10 DIAGNOSIS — Z8701 Personal history of pneumonia (recurrent): Secondary | ICD-10-CM | POA: Diagnosis not present

## 2017-09-10 DIAGNOSIS — Z7189 Other specified counseling: Secondary | ICD-10-CM | POA: Diagnosis not present

## 2017-09-10 DIAGNOSIS — R41841 Cognitive communication deficit: Secondary | ICD-10-CM | POA: Diagnosis not present

## 2017-09-10 DIAGNOSIS — R0902 Hypoxemia: Secondary | ICD-10-CM | POA: Diagnosis not present

## 2017-09-10 DIAGNOSIS — K5909 Other constipation: Secondary | ICD-10-CM | POA: Diagnosis present

## 2017-09-10 DIAGNOSIS — Z681 Body mass index (BMI) 19 or less, adult: Secondary | ICD-10-CM | POA: Diagnosis not present

## 2017-09-10 DIAGNOSIS — E871 Hypo-osmolality and hyponatremia: Secondary | ICD-10-CM | POA: Diagnosis present

## 2017-09-10 DIAGNOSIS — R1314 Dysphagia, pharyngoesophageal phase: Secondary | ICD-10-CM | POA: Diagnosis not present

## 2017-09-10 DIAGNOSIS — M1A09X0 Idiopathic chronic gout, multiple sites, without tophus (tophi): Secondary | ICD-10-CM | POA: Diagnosis not present

## 2017-09-10 DIAGNOSIS — M6281 Muscle weakness (generalized): Secondary | ICD-10-CM | POA: Diagnosis not present

## 2017-09-10 DIAGNOSIS — D649 Anemia, unspecified: Secondary | ICD-10-CM | POA: Diagnosis not present

## 2017-09-10 DIAGNOSIS — Z923 Personal history of irradiation: Secondary | ICD-10-CM | POA: Diagnosis not present

## 2017-09-10 DIAGNOSIS — Z515 Encounter for palliative care: Secondary | ICD-10-CM | POA: Diagnosis not present

## 2017-09-10 DIAGNOSIS — Z809 Family history of malignant neoplasm, unspecified: Secondary | ICD-10-CM | POA: Diagnosis not present

## 2017-09-10 DIAGNOSIS — R634 Abnormal weight loss: Secondary | ICD-10-CM | POA: Diagnosis not present

## 2017-09-10 DIAGNOSIS — C160 Malignant neoplasm of cardia: Secondary | ICD-10-CM | POA: Diagnosis not present

## 2017-09-10 DIAGNOSIS — D630 Anemia in neoplastic disease: Secondary | ICD-10-CM | POA: Diagnosis present

## 2017-09-10 DIAGNOSIS — T85528A Displacement of other gastrointestinal prosthetic devices, implants and grafts, initial encounter: Secondary | ICD-10-CM | POA: Diagnosis not present

## 2017-09-10 DIAGNOSIS — K9423 Gastrostomy malfunction: Secondary | ICD-10-CM | POA: Diagnosis not present

## 2017-09-10 DIAGNOSIS — R1312 Dysphagia, oropharyngeal phase: Secondary | ICD-10-CM | POA: Diagnosis present

## 2017-09-10 DIAGNOSIS — E43 Unspecified severe protein-calorie malnutrition: Secondary | ICD-10-CM | POA: Diagnosis present

## 2017-09-10 DIAGNOSIS — J449 Chronic obstructive pulmonary disease, unspecified: Secondary | ICD-10-CM | POA: Diagnosis not present

## 2017-09-10 DIAGNOSIS — R627 Adult failure to thrive: Secondary | ICD-10-CM | POA: Diagnosis present

## 2017-09-10 DIAGNOSIS — K222 Esophageal obstruction: Secondary | ICD-10-CM | POA: Diagnosis not present

## 2017-09-10 DIAGNOSIS — E46 Unspecified protein-calorie malnutrition: Secondary | ICD-10-CM | POA: Diagnosis not present

## 2017-09-10 DIAGNOSIS — C159 Malignant neoplasm of esophagus, unspecified: Secondary | ICD-10-CM | POA: Diagnosis not present

## 2017-09-10 DIAGNOSIS — C155 Malignant neoplasm of lower third of esophagus: Secondary | ICD-10-CM | POA: Diagnosis not present

## 2017-09-10 DIAGNOSIS — F1020 Alcohol dependence, uncomplicated: Secondary | ICD-10-CM | POA: Diagnosis not present

## 2017-09-10 LAB — CBC
HCT: 25.4 % — ABNORMAL LOW (ref 39.0–52.0)
Hemoglobin: 8.5 g/dL — ABNORMAL LOW (ref 13.0–17.0)
MCH: 32.2 pg (ref 26.0–34.0)
MCHC: 33.5 g/dL (ref 30.0–36.0)
MCV: 96.2 fL (ref 78.0–100.0)
PLATELETS: 399 10*3/uL (ref 150–400)
RBC: 2.64 MIL/uL — ABNORMAL LOW (ref 4.22–5.81)
RDW: 20.1 % — AB (ref 11.5–15.5)
WBC: 3.8 10*3/uL — ABNORMAL LOW (ref 4.0–10.5)

## 2017-09-10 LAB — TYPE AND SCREEN
ABO/RH(D): O POS
ANTIBODY SCREEN: NEGATIVE
Unit division: 0

## 2017-09-10 LAB — BPAM RBC
Blood Product Expiration Date: 201904062359
ISSUE DATE / TIME: 201903152138
UNIT TYPE AND RH: 5100

## 2017-09-10 LAB — ABO/RH: ABO/RH(D): O POS

## 2017-09-10 MED ORDER — SODIUM CHLORIDE 0.9 % IV SOLN
INTRAVENOUS | Status: DC
  Administered 2017-09-10 – 2017-09-11 (×2): via INTRAVENOUS
  Filled 2017-09-10 (×4): qty 1000

## 2017-09-10 NOTE — Progress Notes (Signed)
SLP Cancellation Note  Patient Details Name: George Stokes MRN: [id number] DOB: 03-12-1946   Cancelled treatment:       Reason Eval/Treat Not Completed: Other (comment)(chart review completed and patient's dysphagia is primarily related to complete blockage of distal esophagus and not in SLP's scope of practice to recommend PO's. ) SLP spoke with patient's MD  (Dr. Leisa Lenz) and discussed with her that patient was evaluated by SLP via MBS on 3/11 and although patient's oral and upper pharyngeal dysphagia was mild in severity, he continues with complete obstruction of distal esophagus as per Esophagram completed on 3/11 and SLP is not able to make any recommendations in regards to this type of dysphagia.   Sonia Baller, MA, CCC-SLP 09/10/17 4:54 PM

## 2017-09-10 NOTE — Progress Notes (Addendum)
Patient ID: George Stokes, male   DOB: 1946-05-18, 72 y.o.   MRN: [id number]  PROGRESS NOTE    George Stokes  [id number] DOB: 07/29/1945 DOA: 09/08/2017  PCP: Patient, No Pcp Per   Brief Narrative:  72 y.o.male with COPD, Gout, Esophageal cancer with esophageal obstruction with PEG tube. He presented to hospital after his J tube got out.   Assessment & Plan:   Principal Problem:   Malfunction of J tube  - Has complete obstruction of lower esophagus and unable to eat - IR replaced the tube 3/15  - SLP to eval pt to see if he cn take at least CLD  Active Problems:   Dehydration - Start IV fluids - Pt is not on PO due to esophageal cancer    Malignant neoplasm of lower third of esophagus - Management per oncology     Anemia of chronic disease from esophageal cancer / B12 deficiency - Had 1 U PRBC transfusion 3/15 - Hgb 8.5 this am    Essential hypertension - BP 136/85 - Stable     COPD / Chronic respiratory failure with hypoxia  - Some wheezing this am, no rhonchi     Severe protein calorie malnutrition - In the context of chronic illness - Unable to take PO   DVT prophylaxis: Lovenox subQ Code Status: full code  Family Communication: friend at bedside this am Disposition Plan: SLP re-eval planned for today    Consultants:   SLP  IR  Procedures:   J tube replaced 3/15  Antimicrobials:   None    Subjective: No overnight events. Frustrated that he cant have liquids at least.  Objective: Vitals:   09/09/17 2342 09/10/17 0447 09/10/17 1043 09/10/17 1320  BP: 124/74 117/68  136/85  Pulse: 82 84  89  Resp: 20 18  18   Temp: 97.6 F (36.4 C) 97.6 F (36.4 C)  (!) 97.1 F (36.2 C)  TempSrc: Oral Oral  Oral  SpO2: 98% 91% 98% 97%  Weight:  48.4 kg (106 lb 11.2 oz)    Height:        Intake/Output Summary (Last 24 hours) at 09/10/2017 1330 Last data filed at 09/10/2017 0751 Gross per 24 hour  Intake 836.08 ml  Output 400 ml  Net 436.08 ml     Filed Weights   09/08/17 2004 09/09/17 0047 09/10/17 0447  Weight: 52.2 kg (115 lb) 50.2 kg (110 lb 10.7 oz) 48.4 kg (106 lb 11.2 oz)    Examination:  General exam: Appears calm and comfortable  Respiratory system: wheezing in upper lung lobes, no rhonchi Cardiovascular system: S1 & S2 heard, RRR.  Gastrointestinal system: Abdomen is nondistended, soft and nontender. No organomegaly or masses felt. Normal bowel sounds heard. Central nervous system: Alert and oriented. No focal neurological deficits. Extremities: Symmetric 5 x 5 power. Skin: No rashes, lesions or ulcers Psychiatry: Judgement and insight appear normal. Mood & affect appropriate.   Data Reviewed: I have personally reviewed following labs and imaging studies  CBC: Recent Labs  Lab 09/08/17 2202 09/09/17 0400 09/09/17 1011 09/10/17 0148  WBC 6.1 4.8 3.7* 3.8*  HGB 9.0* 7.5* 7.3* 8.5*  HCT 27.4* 22.6* 22.7* 25.4*  MCV 96.8 97.4 97.0 96.2  PLT 379 340 372 399   Basic Metabolic Panel: Recent Labs  Lab 09/08/17 2202 09/09/17 0400  NA 141 144  K 4.2 4.3  CL 99* 102  CO2 32 33*  GLUCOSE 92 95  BUN 22* 24*  CREATININE 0.64 0.59*  CALCIUM 9.8 9.3   GFR: Estimated Creatinine Clearance: 59.7 mL/min (A) (by C-G formula based on SCr of 0.59 mg/dL (L)). Liver Function Tests: Recent Labs  Lab 09/09/17 0400  AST 16  ALT 12*  ALKPHOS 86  BILITOT 0.7  PROT 5.9*  ALBUMIN 3.0*   No results for input(s): LIPASE, AMYLASE in the last 168 hours. No results for input(s): AMMONIA in the last 168 hours. Coagulation Profile: No results for input(s): INR, PROTIME in the last 168 hours. Cardiac Enzymes: No results for input(s): CKTOTAL, CKMB, CKMBINDEX, TROPONINI in the last 168 hours. BNP (last 3 results) No results for input(s): PROBNP in the last 8760 hours. HbA1C: No results for input(s): HGBA1C in the last 72 hours. CBG: No results for input(s): GLUCAP in the last 168 hours. Lipid Profile: No results for  input(s): CHOL, HDL, LDLCALC, TRIG, CHOLHDL, LDLDIRECT in the last 72 hours. Thyroid Function Tests: No results for input(s): TSH, T4TOTAL, FREET4, T3FREE, THYROIDAB in the last 72 hours. Anemia Panel: Recent Labs    09/09/17 0942 09/09/17 1011  VITAMINB12 194  --   FOLATE  --  49.5  FERRITIN  --  649*  TIBC 235*  --   IRON 30*  --   RETICCTPCT 1.7  --    Urine analysis:    Component Value Date/Time   COLORURINE YELLOW 07/07/2017 2005   APPEARANCEUR CLEAR 07/07/2017 2005   LABSPEC 1.018 07/07/2017 2005   PHURINE 5.0 07/07/2017 2005   GLUCOSEU NEGATIVE 07/07/2017 2005   HGBUR NEGATIVE 07/07/2017 2005   BILIRUBINUR NEGATIVE 07/07/2017 2005   KETONESUR 20 (A) 07/07/2017 2005   PROTEINUR NEGATIVE 07/07/2017 2005   NITRITE NEGATIVE 07/07/2017 2005   LEUKOCYTESUR TRACE (A) 07/07/2017 2005   Sepsis Labs: @LABRCNTIP (procalcitonin:4,lacticidven:4)    MRSA PCR Screening     Status: Abnormal   Collection Time: 09/09/17  1:28 AM  Result Value Ref Range Status   MRSA by PCR POSITIVE (A) NEGATIVE Final      Radiology Studies: No results found.  Scheduled Meds: . enoxaparin   40 mg Subcutaneous QHS  . feeding supplement (PRO-STAT SUGAR FREE 64)  30 mL Oral BID WC  . mometasone-formo  2 puff Inhalation BID   Continuous Infusions: . sodium chloride    . feeding supplement (OSMOLITE 1.2 CAL) 1,000 mL (09/09/17 1859)     LOS: 0 days    Time spent: 25 minutes  Greater than 50% of the time spent on counseling and coordinating the care.   Manson Passey, MD [facility] Pager [phone]  If 7PM-7AM, please contact night-coverage [email or web address] Password [email or web address] 09/10/2017, 1:30 PM

## 2017-09-11 LAB — BASIC METABOLIC PANEL
Anion gap: 8 (ref 5–15)
BUN: 21 mg/dL — ABNORMAL HIGH (ref 6–20)
CHLORIDE: 106 mmol/L (ref 101–111)
CO2: 32 mmol/L (ref 22–32)
Calcium: 8.9 mg/dL (ref 8.9–10.3)
Creatinine, Ser: 0.61 mg/dL (ref 0.61–1.24)
GFR calc Af Amer: >60 mL/min (ref >60)
GFR calc non Af Amer: >60 mL/min (ref >60)
Glucose, Bld: 131 mg/dL — ABNORMAL HIGH (ref 65–99)
Potassium: 4 mmol/L (ref 3.5–5.1)
SODIUM: 146 mmol/L — AB (ref 135–145)

## 2017-09-11 LAB — CBC
HCT: 25.1 % — ABNORMAL LOW (ref 39.0–52.0)
HEMOGLOBIN: 8 g/dL — AB (ref 13.0–17.0)
MCH: 31 pg (ref 26.0–34.0)
MCHC: 31.9 g/dL (ref 30.0–36.0)
MCV: 97.3 fL (ref 78.0–100.0)
Platelets: 350 10*3/uL (ref 150–400)
RBC: 2.58 MIL/uL — ABNORMAL LOW (ref 4.22–5.81)
RDW: 20.2 % — ABNORMAL HIGH (ref 11.5–15.5)
WBC: 3.4 10*3/uL — ABNORMAL LOW (ref 4.0–10.5)

## 2017-09-11 MED ORDER — SODIUM CHLORIDE 0.9 % IV SOLN
INTRAVENOUS | Status: DC
  Administered 2017-09-11 – 2017-09-12 (×2): via INTRAVENOUS

## 2017-09-11 NOTE — Progress Notes (Signed)
Patient ID: George Stokes, male   DOB: 1945-11-14, 72 y.o.   MRN: [id number]  PROGRESS NOTE    George Stokes  [id number] DOB: [birth year] DOA: 09/08/2017  PCP: Patient, No Pcp Per   Brief Narrative:  72 y.o.male with COPD, Gout, Esophageal cancer with esophageal obstruction with PEG tube. He presented to hospital after his J tube got out.   Assessment & Plan:   Principal Problem:   Malfunction of J tube  - Has complete obstruction of lower esophagus and unable to eat - IR replaced the tube 3/15  - Per SLP, do not recommend liquids or solids due to complete obstruction   Active Problems:   Dehydration - Continue IV fluids for hydration  - Pt is not on PO due to esophageal cancer    Hypernatremia - Due to dehydration - On IV fluids - Follow up BMP in am    Malignant neoplasm of lower third of esophagus - Management per oncology     Anemia of chronic disease from esophageal cancer / B12 deficiency - Had 1 U PRBC transfusion 3/15 - Hgb 8 this am - Transfuse for hgb less than 7    Essential hypertension - BP stable     COPD / Chronic respiratory failure with hypoxia  - Stable resp status     Severe protein calorie malnutrition - In the context of chronic illness - Unable to take PO   DVT prophylaxis: Lovenox subQ Code Status: full code  Family Communication: no family at the bedside  Disposition Plan: to SNF in am if hgb and sodium stable    Consultants:   SLP  IR  Procedures:   J tube replaced 3/15  Antimicrobials:   None    Subjective: No overnight events.  Objective: Vitals:   09/10/17 2153 09/11/17 0456 09/11/17 0917 09/11/17 1255  BP:  120/81  128/85  Pulse:  80  90  Resp:  18  20  Temp:  97.8 F (36.6 C)  99.1 F (37.3 C)  TempSrc:  Oral  Oral  SpO2: 91% 98% 95% 93%  Weight:  50.2 kg (110 lb 10.7 oz)    Height:        Intake/Output Summary (Last 24 hours) at 09/11/2017 1303 Last data filed at 09/11/2017 1255 Gross per 24  hour  Intake 2396.83 ml  Output 700 ml  Net 1696.83 ml   Filed Weights   09/09/17 0047 09/10/17 0447 09/11/17 0456  Weight: 50.2 kg (110 lb 10.7 oz) 48.4 kg (106 lb 11.2 oz) 50.2 kg (110 lb 10.7 oz)    Physical Exam  Constitutional: Appears well-developed and well-nourished. No distress.  CVS: RRR, S1/S2 + Pulmonary: Effort and breath sounds normal, no stridor, rhonchi, wheezes, rales.  Abdominal: Soft. BS +,  no distension, (+) PEG Musculoskeletal: Normal range of motion. No edema and no tenderness.  Lymphadenopathy: No lymphadenopathy noted, cervical, inguinal. Neuro: Alert. Normal reflexes, muscle tone coordination. No cranial nerve deficit. Skin: Skin is warm and dry. No rash noted. Not diaphoretic. No erythema. No pallor.  Psychiatric: Normal mood and affect. Behavior, judgment, thought content normal.     Data Reviewed: I have personally reviewed following labs and imaging studies  CBC: Recent Labs  Lab 09/08/17 2202 09/09/17 0400 09/09/17 1011 09/10/17 0148 09/11/17 0400  WBC 6.1 4.8 3.7* 3.8* 3.4*  HGB 9.0* 7.5* 7.3* 8.5* 8.0*  HCT 27.4* 22.6* 22.7* 25.4* 25.1*  MCV 96.8 97.4 97.0 96.2 97.3  PLT 379 340 372 399 350  Basic Metabolic Panel: Recent Labs  Lab 09/08/17 2202 09/09/17 0400 09/11/17 0400  NA 141 144 146*  K 4.2 4.3 4.0  CL 99* 102 106  CO2 32 33* 32  GLUCOSE 92 95 131*  BUN 22* 24* 21*  CREATININE 0.64 0.59* 0.61  CALCIUM 9.8 9.3 8.9   GFR: Estimated Creatinine Clearance: 61.9 mL/min (by C-G formula based on SCr of 0.61 mg/dL). Liver Function Tests: Recent Labs  Lab 09/09/17 0400  AST 16  ALT 12*  ALKPHOS 86  BILITOT 0.7  PROT 5.9*  ALBUMIN 3.0*   No results for input(s): LIPASE, AMYLASE in the last 168 hours. No results for input(s): AMMONIA in the last 168 hours. Coagulation Profile: No results for input(s): INR, PROTIME in the last 168 hours. Cardiac Enzymes: No results for input(s): CKTOTAL, CKMB, CKMBINDEX, TROPONINI in  the last 168 hours. BNP (last 3 results) No results for input(s): PROBNP in the last 8760 hours. HbA1C: No results for input(s): HGBA1C in the last 72 hours. CBG: No results for input(s): GLUCAP in the last 168 hours. Lipid Profile: No results for input(s): CHOL, HDL, LDLCALC, TRIG, CHOLHDL, LDLDIRECT in the last 72 hours. Thyroid Function Tests: No results for input(s): TSH, T4TOTAL, FREET4, T3FREE, THYROIDAB in the last 72 hours. Anemia Panel: Recent Labs    09/09/17 0942 09/09/17 1011  VITAMINB12 194  --   FOLATE  --  49.5  FERRITIN  --  649*  TIBC 235*  --   IRON 30*  --   RETICCTPCT 1.7  --    Urine analysis:    Component Value Date/Time   COLORURINE YELLOW 07/07/2017 2005   APPEARANCEUR CLEAR 07/07/2017 2005   LABSPEC 1.018 07/07/2017 2005   PHURINE 5.0 07/07/2017 2005   GLUCOSEU NEGATIVE 07/07/2017 2005   HGBUR NEGATIVE 07/07/2017 2005   BILIRUBINUR NEGATIVE 07/07/2017 2005   KETONESUR 20 (A) 07/07/2017 2005   PROTEINUR NEGATIVE 07/07/2017 2005   NITRITE NEGATIVE 07/07/2017 2005   LEUKOCYTESUR TRACE (A) 07/07/2017 2005   Sepsis Labs: @LABRCNTIP (procalcitonin:4,lacticidven:4)    MRSA PCR Screening     Status: Abnormal   Collection Time: 09/09/17  1:28 AM  Result Value Ref Range Status   MRSA by PCR POSITIVE (A) NEGATIVE Final      Radiology Studies: No results found.  Scheduled Meds: . enoxaparin   40 mg Subcutaneous QHS  . feeding supplement (PRO-STAT SUGAR FREE 64)  30 mL Oral BID WC  . mometasone-formo  2 puff Inhalation BID   Continuous Infusions: . sodium chloride    . feeding supplement (OSMOLITE 1.2 CAL) 1,000 mL (09/09/17 1859)  . sodium chloride 0.9 % 1,000 mL infusion 75 mL/hr at 09/11/17 0415     LOS: 1 day    Time spent: 25 minutes  Greater than 50% of the time spent on counseling and coordinating the care.   Manson Passey, MD [facility] Pager [phone]  If 7PM-7AM, please contact  night-coverage [email or web address] Password [email or web address] 09/11/2017, 1:03 PM

## 2017-09-12 ENCOUNTER — Ambulatory Visit
Admission: RE | Admit: 2017-09-12 | Discharge: 2017-09-12 | Disposition: A | Discharge status: Home / Self Care | Payer: Medicare Other | Source: Ambulatory Visit | Attending: Radiation Oncology | Admitting: Radiation Oncology

## 2017-09-12 ENCOUNTER — Encounter (HOSPITAL_COMMUNITY): Payer: Self-pay | Admitting: Interventional Radiology

## 2017-09-12 DIAGNOSIS — E860 Dehydration: Secondary | ICD-10-CM

## 2017-09-12 DIAGNOSIS — J449 Chronic obstructive pulmonary disease, unspecified: Secondary | ICD-10-CM

## 2017-09-12 DIAGNOSIS — T85528A Displacement of other gastrointestinal prosthetic devices, implants and grafts, initial encounter: Secondary | ICD-10-CM

## 2017-09-12 DIAGNOSIS — R0902 Hypoxemia: Secondary | ICD-10-CM

## 2017-09-12 DIAGNOSIS — I10 Essential (primary) hypertension: Secondary | ICD-10-CM

## 2017-09-12 DIAGNOSIS — M1A09X0 Idiopathic chronic gout, multiple sites, without tophus (tophi): Secondary | ICD-10-CM

## 2017-09-12 LAB — BASIC METABOLIC PANEL
ANION GAP: 7 (ref 5–15)
BUN: 18 mg/dL (ref 6–20)
CHLORIDE: 107 mmol/L (ref 101–111)
CO2: 30 mmol/L (ref 22–32)
CREATININE: 0.55 mg/dL — AB (ref 0.61–1.24)
Calcium: 8.9 mg/dL (ref 8.9–10.3)
GFR calc non Af Amer: >60 mL/min (ref >60)
Glucose, Bld: 111 mg/dL — ABNORMAL HIGH (ref 65–99)
POTASSIUM: 3.9 mmol/L (ref 3.5–5.1)
SODIUM: 144 mmol/L (ref 135–145)

## 2017-09-12 LAB — CBC
HCT: 25.6 % — ABNORMAL LOW (ref 39.0–52.0)
HEMOGLOBIN: 8.2 g/dL — AB (ref 13.0–17.0)
MCH: 31.4 pg (ref 26.0–34.0)
MCHC: 32 g/dL (ref 30.0–36.0)
MCV: 98.1 fL (ref 78.0–100.0)
Platelets: 349 10*3/uL (ref 150–400)
RBC: 2.61 MIL/uL — AB (ref 4.22–5.81)
RDW: 19.6 % — ABNORMAL HIGH (ref 11.5–15.5)
WBC: 3.8 10*3/uL — AB (ref 4.0–10.5)

## 2017-09-12 MED ORDER — SENNOSIDES 8.8 MG/5ML PO SYRP
5.0000 mL | ORAL_SOLUTION | Freq: Every day | ORAL | Status: DC
  Administered 2017-09-13: 5 mL via ORAL
  Filled 2017-09-12 (×3): qty 5

## 2017-09-12 MED ORDER — CLOTRIMAZOLE 1 % EX CREA
TOPICAL_CREAM | Freq: Two times a day (BID) | CUTANEOUS | Status: DC
  Administered 2017-09-12 – 2017-09-14 (×4): via TOPICAL
  Filled 2017-09-12: qty 15

## 2017-09-12 MED ORDER — CHLORHEXIDINE GLUCONATE CLOTH 2 % EX PADS
6.0000 | MEDICATED_PAD | Freq: Every day | CUTANEOUS | Status: DC
  Administered 2017-09-13 – 2017-09-14 (×2): 6 via TOPICAL

## 2017-09-12 MED ORDER — MUPIROCIN 2 % EX OINT
1.0000 "application " | TOPICAL_OINTMENT | Freq: Two times a day (BID) | CUTANEOUS | Status: DC
  Administered 2017-09-12 – 2017-09-14 (×5): 1 via NASAL
  Filled 2017-09-12 (×3): qty 22

## 2017-09-12 NOTE — Progress Notes (Signed)
PROGRESS NOTE  George Stokes ZOX:[id number] DOB: 05-17-1946 DOA: 09/08/2017 PCP: Patient, No Pcp Per  HPI/Recap of past 24 hours: 72 y.o.malewith COPD,Gout,Esophageal cancerwith esophageal obstruction with PEG tube. He presented to hospital after his J tube got out.  09/12/17: Patient seen and examined with his sister at bedside.  He denies any dyspnea, chest pain, or abdominal pain.  He admits to intermittent nausea lasting only a few seconds and resolving after he vomits.  Had one episode of vomiting this morning.  IV Zofran in place.  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   Malignant neoplasm of lower third of esophagus (HCC)   Anemia   Dehydration  J-tube malfunction Replaced by interventional radiology on 09/09/2017 Speech therapist does not recommend liquids or solids due to complete obstruction  Failure to thrive Continue tube feeding Dietitian consult  Hyponatremia: Resolved On IV fluid hydration  Malignant neoplasm of lower third of esophagus Oncology following  Anemia of chronic disease from esophageal cancer Administer 1 unit PRBC Hemoglobin stable No sign of overt bleeding Transfuse for hemoglobin less than 7  Hypertension blood pressure stable  COPD/chronic respiratory failure with hypoxia Stable Continue O2 supplementation to maintain O2 saturation 92% or above  Severe protein calorie managed malnutrition Abdomen Continue tube feeding Dietary consult  Goals of care Palliative care team consult   Code Status: Full   Family Communication: None at bedside  Disposition Plan: SNF when hemodynamically stable   Consultants:  Palliative care team   Procedures:  J Tube replacement   Antimicrobials:  None  DVT prophylaxis:  SCDs   Objective: Vitals:   09/11/17 2020 09/12/17 0651 09/12/17 0845 09/12/17 1114  BP: 127/81 126/84  127/76  Pulse: 88 82  87  Resp: 20 16  16   Temp: 97.7 F (36.5 C) 97.8 F (36.6 C)  98.4 F (36.9 C)  TempSrc: Oral Oral  Oral  SpO2: 99% 100% 94% 97%  Weight:  51.9 kg (114 lb 6.7 oz)    Height:        Intake/Output Summary (Last 24 hours) at 09/12/2017 1318 Last data filed at 09/12/2017 0654 Gross per 24 hour  Intake 1839.5 ml  Output 950 ml  Net 889.5 ml   Filed Weights   09/10/17 0447 09/11/17 0456 09/12/17 0651  Weight: 48.4 kg (106 lb 11.2 oz) 50.2 kg (110 lb 10.7 oz) 51.9 kg (114 lb 6.7 oz)    Exam:   General:  72 yo CM frail NAD A&O x 3   Cardiovascular: RRR no rubs or gallops   Respiratory: CTA no wheezes or rales  Abdomen: Jtube in place  Musculoskeletal: No focal deficits  Skin: rash neck, non blancheable  Psychiatry: Mood apropriate    Data Reviewed: CBC: Recent Labs  Lab 09/09/17 0400 09/09/17 1011 09/10/17 0148 09/11/17 0400 09/12/17 0425  WBC 4.8 3.7* 3.8* 3.4* 3.8*  HGB 7.5* 7.3* 8.5* 8.0* 8.2*  HCT 22.6* 22.7* 25.4* 25.1* 25.6*  MCV 97.4 97.0 96.2 97.3 98.1  PLT 340 372 399 350 349   Basic Metabolic Panel: Recent Labs  Lab 09/08/17 2202 09/09/17 0400 09/11/17 0400 09/12/17 0425  NA 141 144 146* 144  K 4.2 4.3 4.0 3.9  CL 99* 102 106 107  CO2 32 33* 32 30  GLUCOSE 92 95 131* 111*  BUN 22* 24* 21* 18  CREATININE 0.64 0.59* 0.61 0.55*  CALCIUM 9.8 9.3 8.9 8.9   GFR: Estimated Creatinine Clearance: 64 mL/min (A) (by C-G formula based on SCr of 0.55 mg/dL (L)). Liver Function Tests: Recent Labs  Lab 09/09/17 0400  AST 16  ALT 12*  ALKPHOS 86  BILITOT 0.7  PROT 5.9*  ALBUMIN 3.0*   No results for input(s): LIPASE, AMYLASE in the last 168 hours. No results for input(s): AMMONIA in the last 168 hours. Coagulation Profile: No results for input(s): INR, PROTIME in the last 168 hours. Cardiac Enzymes: No results  for input(s): CKTOTAL, CKMB, CKMBINDEX, TROPONINI in the last 168 hours. BNP (last 3 results) No results for input(s): PROBNP in the last 8760 hours. HbA1C: No results for input(s): HGBA1C in the last 72 hours. CBG: No results for input(s): GLUCAP in the last 168 hours. Lipid Profile: No results for input(s): CHOL, HDL, LDLCALC, TRIG, CHOLHDL, LDLDIRECT in the last 72 hours. Thyroid Function Tests: No results for input(s): TSH, T4TOTAL, FREET4, T3FREE, THYROIDAB in the last 72 hours. Anemia Panel: No results for input(s): VITAMINB12, FOLATE, FERRITIN, TIBC, IRON, RETICCTPCT in the last 72 hours. Urine analysis:    Component Value Date/Time   COLORURINE YELLOW 07/07/2017 2005   APPEARANCEUR CLEAR 07/07/2017 2005   LABSPEC 1.018 07/07/2017 2005   PHURINE 5.0 07/07/2017 2005   GLUCOSEU NEGATIVE 07/07/2017 2005   HGBUR NEGATIVE 07/07/2017 2005   BILIRUBINUR NEGATIVE 07/07/2017 2005   KETONESUR 20 (A) 07/07/2017 2005   PROTEINUR NEGATIVE 07/07/2017 2005   NITRITE NEGATIVE 07/07/2017 2005   LEUKOCYTESUR TRACE (A) 07/07/2017 2005   Sepsis Labs: @LABRCNTIP (procalcitonin:4,lacticidven:4)  ) Recent Results (from the past 240 hour(s))  MRSA PCR Screening     Status: Abnormal   Collection Time: 09/09/17  1:28 AM  Result Value Ref Range Status   MRSA by PCR POSITIVE (A) NEGATIVE Final    Comment:        The GeneXpert MRSA Assay (FDA approved for NASAL specimens only), is one component of a comprehensive MRSA colonization surveillance program. It is not intended to diagnose MRSA infection nor to guide or monitor treatment for MRSA infections. RESULT CALLED TO, READ BACK BY AND VERIFIED WITH: Carmela Rima RN [id number] 09/09/17 A NAVARRO Performed at [facility], [address] [place], [place] [address]       Studies: No results found.  Scheduled Meds: . chlorhexidine  15 mL Mouth Rinse BID  . [START ON 09/13/2017] Chlorhexidine Gluconate Cloth  6 each Topical  Q0600  . enoxaparin (LOVENOX) injection  40 mg Subcutaneous QHS  . feeding supplement (PRO-STAT SUGAR FREE 64)  30 mL Oral BID WC  . mouth rinse  15 mL Mouth Rinse q12n4p  . mometasone-formoterol  2 puff Inhalation BID  . mupirocin ointment  1 application Nasal BID  . sennosides  5 mL Oral QHS  . sodium chloride flush  3 mL Intravenous Q12H    Continuous Infusions: . sodium chloride    . sodium chloride 75 mL/hr at 09/12/17 0150  . feeding supplement (OSMOLITE 1.2 CAL) 1,000 mL (09/12/17 0333)     LOS: 2 days     Darlin Drop, MD [facility] Pager [phone]  If 7PM-7AM, please contact night-coverage [email or web address] Password [email or web address] 09/12/2017, 1:18 PM

## 2017-09-12 NOTE — Progress Notes (Signed)
Palliative Medicine consult noted. Due to high referral volume, there may be a delay seeing this patient. Please call the Palliative Medicine Team office at [phone] if recommendations are needed in the interim.  Thank you for inviting [place] to see this patient.   Marjie Skiff Taryn Nave, RN, BSN, [facility] Palliative Medicine Team 09/12/2017 1:14 PM Office [phone]

## 2017-09-12 NOTE — Progress Notes (Signed)
CSW following to assist with disposition. Pt arrived from [facility] SN[facility] where he has been for rehab since 08/02/17. Plan has been to return to continue rehab at [place]. Today, CSW met with pt and sister at bedside. Pt now stating he is "wondering if he's ready for hospice." Sister explains that PMT has been consulted. Sister and brother Henrietta Dine) are awaiting further information about pt's treatment options and prognosis prior to making DC plans. States, "If he decides he is not ready and wants to continue rehab, he wants to go back to [facility], but if he wants hospice instead he would rather be at home if possible, so there is a lot we need to talk about together." CSW briefly explained generalities of what would be involved in going to a facility with hospice versus home, and residential hospice. Will follow and assist.  Updated [facility].  Sharren Bridge, MSW, LCSW Clinical Social Work 09/12/2017 [phone]

## 2017-09-13 ENCOUNTER — Telehealth: Payer: Self-pay | Admitting: *Deleted

## 2017-09-13 DIAGNOSIS — Z7189 Other specified counseling: Secondary | ICD-10-CM

## 2017-09-13 DIAGNOSIS — Z515 Encounter for palliative care: Secondary | ICD-10-CM

## 2017-09-13 DIAGNOSIS — K9423 Gastrostomy malfunction: Secondary | ICD-10-CM

## 2017-09-13 DIAGNOSIS — Z9221 Personal history of antineoplastic chemotherapy: Secondary | ICD-10-CM

## 2017-09-13 DIAGNOSIS — F1020 Alcohol dependence, uncomplicated: Secondary | ICD-10-CM

## 2017-09-13 DIAGNOSIS — R634 Abnormal weight loss: Secondary | ICD-10-CM

## 2017-09-13 DIAGNOSIS — K222 Esophageal obstruction: Secondary | ICD-10-CM

## 2017-09-13 DIAGNOSIS — C155 Malignant neoplasm of lower third of esophagus: Secondary | ICD-10-CM

## 2017-09-13 DIAGNOSIS — Z923 Personal history of irradiation: Secondary | ICD-10-CM

## 2017-09-13 DIAGNOSIS — E46 Unspecified protein-calorie malnutrition: Secondary | ICD-10-CM

## 2017-09-13 DIAGNOSIS — M109 Gout, unspecified: Secondary | ICD-10-CM

## 2017-09-13 DIAGNOSIS — J9611 Chronic respiratory failure with hypoxia: Secondary | ICD-10-CM

## 2017-09-13 DIAGNOSIS — E441 Mild protein-calorie malnutrition: Secondary | ICD-10-CM

## 2017-09-13 DIAGNOSIS — C159 Malignant neoplasm of esophagus, unspecified: Secondary | ICD-10-CM

## 2017-09-13 NOTE — Consult Note (Signed)
Consultation Note Date: 09/13/2017   Patient Name: George Stokes  DOB: 03/05/1946  MRN: [id number]  Age / Sex: 72 y.o., male  PCP: Patient, No Pcp Per Referring Physician: Darlin Drop, DO  Reason for Consultation: Establishing goals of care  HPI/Patient Profile: 72 y.o. male  with past medical history of esophageal cancer with esophageal obstruction, pneumonia, COPD on home oxygen, HTN, heart murmur, and gout admitted on 09/08/2017 after PEG tube had fallen out at [facility]. Followed by oncology outpatient for esophageal cancer. Upper endoscopy on 07/13/17 with GE junction mass that could not be passed with standard endoscope. Biopsy suspicious for adenocarcinoma. He has completed three weekly treatments with Taxol/carboplatin. Patient was scheduled to complete radiation on 09/08/17. Esophagram on 09/05/17 revealed complete obstruction within distal esophagus. PEG was replaced by IR on 3/15. Receiving tube feeding. Also with anemia of chronic disease from underlying esophageal cancer. Palliative medicine consultation for goals of care.   Clinical Assessment and Goals of Care: I have reviewed medical records, discussed with care team, and met with sister George Stokes) in conference room to discuss diagnosis, GOC, EOL wishes, disposition and options. Brother, George Stokes, is on speaker phone.   Introduced Palliative Medicine as specialized medical care for people living with serious illness. It focuses on providing relief from the symptoms and stress of a serious illness. The goal is to improve quality of life for both the patient and the family.  We discussed a brief life review of the patient. George Stokes describes George Stokes as a "hermit" who finds joy by sitting in front of his computer playing games and talking politics with people around the country. He has never been married or had children. She also describes him as "independent" and  "stubborn."  George Stokes recaps the last few months since his diagnosis of esophageal cancer including receiving feeding tube in January. He has been receiving chemotherapy and radiation. George Stokes speaks of her concerns with him being very stubborn and not having a good grasp of reality with his diagnosis--examples of this including wanting to go home (where he lives alone) when in reality he is extremely weak and requires at least one person assist. Also, wanting feeding tube out because he believes he can eat on his own.   Discussed hospital diagnoses and interventions including recent results from 3/11 esophagram with complete obstruction within distal esophagus. Explained concern that he has not responded to chemotherapy or radiation. George Stokes and George Stokes need guidance with next steps including prognosis. Explained that I have contacted Dr. Kalman Drape nurse to notify him that George Stokes is admitted. Also oncology consult placed.   George Stokes and George Stokes ask about hospice options--I explained that he may be eligible for hospice depending on oncology input. Educated on hospice philosophy and hospice options. George Stokes speaks of "quality of life" being very important for her brother. She does not want to see him suffer but also wants to honor his wishes. George Stokes and George Stokes live in [place]. Area and wish they could eventually move George Stokes to the area to be closer to them.  After my conversation with George Stokes, I went to bedside to introduce myself to patient. Again introduced role of palliative medicine.   George Stokes also recalls his health in the last few months since being diagnosed with cancer. He tells me he completed chemo and radiation.   Discussed hospital diagnoses and interventions including repeat results from esophagram. I also explained my concern with patient that cancer did not respond to chemo or radiation. I explained SLP results for continued NPO status and tube feeds because of complete esophageal obstruction. Explained  feeding tube being a form of life support at this point. George Stokes speaks of feeling the radiation/chemo hasn't helped because he has continued to be nauseous with intermittent episodes of vomiting. I also explained to George Stokes that I have placed an oncology input for guidance on next steps.   Advanced directives, concepts specific to code status, and artifical feeding and hydration were discussed. Patient does not have documented living will or HCPOA. Sister and brother are next of kin and automatically HCPOA's if he does not have capacity to make decisions. Explained importance of discussing and documenting wishes with family including his thoughts on resuscitation/life support. At this point, patient would want resuscitation attempted. He is clear during conversation that he would not want to live in a "vegetative state." Left MOST form for review. Patient/sister agreeable with chaplain consult to review AD packet.   Palliative Care services outpatient were explained and offered. Sister agreeable. Questions and concerns were addressed. PMT contact information given.    SUMMARY OF RECOMMENDATIONS    Continue FULL code/FULL scope treatment.  Oncology consulted and call placed to Dr. Kalman Drape nurse to notify of admission. Pending oncology input regarding interventions and prognosis.   Patient/sister interested in chaplain consult to review and complete AD packet.  Will need ongoing GOC conversations. Recommend continued support from outpatient palliative services on discharge. Likely back to SNF for rehab.   Code Status/Advance Care Planning:  Full code  Symptom Management:   Per attending  Palliative Prophylaxis:   Aspiration, Delirium Protocol and Frequent Pain Assessment  Psycho-social/Spiritual:   Desire for further Chaplaincy support: yes  Additional Recommendations: Caregiving  Support/Resources and Education on Hospice  Prognosis:   Unable to determine  Discharge Planning: To  Be Determined      Primary Diagnoses: Present on Admission: . Malfunction of gastrostomy tube (HCC) . Idiopathic chronic gout of multiple sites without tophus . Malignant neoplasm of lower third of esophagus (HCC) . Chronic respiratory failure with hypoxia (HCC) . Essential hypertension . COPD with hypoxia (HCC) . Dehydration   I have reviewed the medical record, interviewed the patient and family, and examined the patient. The following aspects are pertinent.  Past Medical History:  Diagnosis Date  . COPD (chronic obstructive pulmonary disease) (HCC)   . Dyspnea   . esophageal ca dx'd 07/13/17   mass at GE junction  . Esophageal mass   . Gout   . Heart murmur    during teenage years   . Hypertension   . On home oxygen therapy    1-2 L/M   . Pneumonia    Social History   Socioeconomic History  . Marital status: Single    Spouse name: None  . Number of children: None  . Years of education: None  . Highest education level: None  Social Needs  . Financial resource strain: None  . Food insecurity - worry: None  . Food insecurity - inability: None  . Transportation needs - medical: None  .  Transportation needs - non-medical: None  Occupational History  . Occupation: retired  Tobacco Use  . Smoking status: Former Smoker    Packs/day: 2.00    Years: 43.00    Pack years: 86.00    Types: Cigarettes    Last attempt to quit: 05/03/2009    Years since quitting: 8.3  . Smokeless tobacco: Never Used  Substance and Sexual Activity  . Alcohol use: Yes    Alcohol/week: 0.0 oz    Comment: vodka daily;1-2 drinks in last 4 weeks   . Drug use: No  . Sexual activity: No  Other Topics Concern  . None  Social History Narrative  . None   Family History  Problem Relation Age of Onset  . Dementia Mother   . Cancer Father   . Gout Father   . Thyroid disease Sister   . Kidney disease Brother   . Gout Brother   . Diabetes Maternal Grandmother   . Stroke Maternal  Grandfather    Scheduled Meds: . chlorhexidine  15 mL Mouth Rinse BID  . Chlorhexidine Gluconate Cloth  6 each Topical Q0600  . clotrimazole   Topical BID  . enoxaparin (LOVENOX) injection  40 mg Subcutaneous QHS  . feeding supplement (PRO-STAT SUGAR FREE 64)  30 mL Oral BID WC  . mouth rinse  15 mL Mouth Rinse q12n4p  . mometasone-formoterol  2 puff Inhalation BID  . mupirocin ointment  1 application Nasal BID  . sennosides  5 mL Oral QHS  . sodium chloride flush  3 mL Intravenous Q12H   Continuous Infusions: . sodium chloride    . sodium chloride 75 mL/hr at 09/12/17 0150  . feeding supplement (OSMOLITE 1.2 CAL) 1,000 mL (09/12/17 0333)   PRN Meds:.sodium chloride, acetaminophen **OR** acetaminophen, albuterol, heparin lock flush, hydrALAZINE, HYDROmorphone (DILAUDID) injection, lidocaine, metoprolol tartrate, ondansetron (ZOFRAN) IV, oxyCODONE, sodium chloride flush Medications Prior to Admission:  Prior to Admission medications   Medication Sig Start Date End Date Taking? Authorizing Provider  albuterol (PROVENTIL HFA;VENTOLIN HFA) 108 (90 Base) MCG/ACT inhaler Inhale 2 puffs into the lungs every 6 (six) hours as needed for wheezing or shortness of breath. 08/02/17  Yes Emokpae, Courage, MD  amLODipine (NORVASC) 10 MG tablet Take 1 tablet (10 mg total) by mouth daily. 01/04/17  Yes Sagardia, Eilleen Kempf, MD  clotrimazole-betamethasone (LOTRISONE) cream Apply 1 application topically 2 (two) times daily. 01/04/17  Yes Sagardia, Eilleen Kempf, MD  heparin lock flush 100 UNIT/ML SOLN injection 2.5 mLs (250 Units total) by Intracatheter route once as needed (Hickman or PICC flush). 08/02/17  Yes Shon Hale, MD  HYDROcodone-acetaminophen (NORCO/VICODIN) 5-325 MG tablet Take 1 tablet by mouth every 6 (six) hours as needed for severe pain. 08/02/17  Yes Emokpae, Courage, MD  indomethacin (INDOCIN) 25 MG capsule Take 25 mg by mouth daily as needed for mild pain.   Yes [provider]    Nutritional Supplements (FEEDING SUPPLEMENT, OSMOLITE 1.5 CAL,) LIQD 60 mL an hour via PEG tube continuously--with water flushes 50 mL every 4 hours 08/02/17  Yes Emokpae, Courage, MD  ondansetron (ZOFRAN) 8 MG tablet Take 1 tablet (8 mg total) by mouth every 8 (eight) hours. 09/08/17  Yes Ronny Bacon, PA-C  Oxycodone HCl 10 MG TABS Take 1 tablet (10 mg total) by mouth every 4 (four) hours as needed for severe pain. 08/02/17  Yes Shon Hale, MD  SYMBICORT 160-4.5 MCG/ACT inhaler Inhale 2 puffs into the lungs 2 (two) times daily. 01/24/17  Yes  Nyoka Cowden, MD  zolpidem (AMBIEN) 5 MG tablet Take 1 tablet daily at bedtime as needed 05/01/15  Yes Le, Thao P, DO  ipratropium-albuterol (DUONEB) 0.5-2.5 (3) MG/3ML SOLN Take 3 mLs by nebulization 3 (three) times daily. Patient not taking: Reported on 09/08/2017 08/02/17   Shon Hale, MD  metoprolol tartrate (LOPRESSOR) 25 mg/10 mL SUSP Place 10 mLs (25 mg total) into feeding tube 2 (two) times daily. Patient not taking: Reported on 09/08/2017 08/02/17   Shon Hale, MD  Multiple Vitamin (MULTIVITAMIN) LIQD Take 15 mLs by mouth daily. Patient not taking: Reported on 09/08/2017 08/03/17   Shon Hale, MD  ranitidine (ZANTAC) 150 MG/10ML syrup Place 10 mLs (150 mg total) into feeding tube 2 (two) times daily. Patient not taking: Reported on 09/08/2017 08/02/17   Shon Hale, MD  Tiotropium Bromide Monohydrate (SPIRIVA RESPIMAT) 2.5 MCG/ACT AERS Inhale 2 puffs into the lungs daily. Patient not taking: Reported on 06/29/2017 12/31/16   Nyoka Cowden, MD   No Known Allergies Review of Systems  Constitutional: Positive for activity change and appetite change.  Gastrointestinal: Positive for nausea and vomiting.  Neurological: Positive for weakness.   Physical Exam  Constitutional: He is oriented to person, place, and time. He appears cachectic. He is cooperative.  HENT:  Head: Normocephalic and atraumatic.  Cardiovascular: Regular  rhythm.  Pulmonary/Chest: Effort normal. No accessory muscle usage. No tachypnea. No respiratory distress.  Abdominal:  PEG tube  Neurological: He is alert and oriented to person, place, and time.  Skin: Skin is warm and dry. There is pallor.  Psychiatric: He has a normal mood and affect. His speech is normal and behavior is normal. Cognition and memory are normal.  Nursing note and vitals reviewed.   Vital Signs: BP 131/87 (BP Location: Right Arm)   Pulse 83   Temp 97.8 F (36.6 C) (Oral)   Resp 16   Ht 5\' 7"  (1.702 m)   Wt 51.9 kg (114 lb 6.7 oz)   SpO2 100%   BMI 17.92 kg/m  Pain Assessment: No/denies pain   Pain Score: 0-No pain   SpO2: SpO2: 100 % O2 Device:SpO2: 100 % O2 Flow Rate: .O2 Flow Rate (L/min): 4 L/min  IO: Intake/output summary:   Intake/Output Summary (Last 24 hours) at 09/13/2017 1447 Last data filed at 09/13/2017 1155 Gross per 24 hour  Intake 0 ml  Output 1400 ml  Net -1400 ml    LBM: Last BM Date: 09/08/17 Baseline Weight: Weight: 52.2 kg (115 lb) Most recent weight: Weight: 51.9 kg (114 lb 6.7 oz)     Palliative Assessment/Data: PPS 50%   Flowsheet Rows     Most Recent Value  Intake Tab  Referral Department  Hospitalist  Unit at Time of Referral  Med/Surg Unit  Palliative Care Primary Diagnosis  Cancer  Palliative Care Type  New Palliative care  Reason for referral  Clarify Goals of Care  Date first seen by Palliative Care  09/13/17  Clinical Assessment  Palliative Performance Scale Score  50%  Psychosocial & Spiritual Assessment  Palliative Care Outcomes  Patient/Family meeting held?  Yes  Who was at the meeting?  patient and sister  Palliative Care Outcomes  Clarified goals of care, Provided psychosocial or spiritual support, Provided end of life care assistance, Counseled regarding hospice, ACP counseling assistance      Time In: 1500 Time Out: 1620 Time Total: Greater than 50%  of this time was spent counseling and  coordinating care related to  the above assessment and plan.  Signed by:  Vennie Homans, FNP-C Palliative Medicine Team  Phone: [phone] Fax: [phone]   Please contact Palliative Medicine Team phone at [phone] for questions and concerns.  For individual provider: See Loretha Stapler

## 2017-09-13 NOTE — Progress Notes (Signed)
IP PROGRESS NOTE  Subjective:   George Stokes is known to me with a history of esophagus cancer.  He completed a final radiation treatment yesterday.  He was last treated with Taxol/carboplatin chemotherapy on 08/31/2017.  He was admitted 09/08/2017 after the feeding tube dislodged.  The tube was replaced by interventional radiology 09/09/2017. He has no new complaint.  He reports being able to swallow his secretions and liquids.  An esophagram 09/05/2017 revealed complete obstruction at the distal esophagus.  A swallow evaluation on the same day revealed mild dysphasia of the oral pharyngeal phase.  He was felt to have mild aspiration risk.  George Stokes is considering hospice care.  His sister is at the bedside. Objective: Vital signs in last 24 hours: Blood pressure 131/87, pulse 83, temperature 97.8 F (36.6 C), temperature source Oral, resp. rate 16, height 5\' 7"  (1.702 m), weight 114 lb 6.7 oz (51.9 kg), SpO2 100 %.  Intake/Output from previous day: 03/18 0701 - 03/19 0700 In: -  Out: 850 [Urine:850]  Physical Exam:  HEENT: No thrush Lungs: Distant breath sounds, no respiratory distress Cardiac: Regular rate and rhythm Abdomen: Left upper quadrant feeding tube site without evidence of infection.  No hepatomegaly, nontender Extremities: No leg edema   Lab Results: Recent Labs    09/11/17 0400 09/12/17 0425  WBC 3.4* 3.8*  HGB 8.0* 8.2*  HCT 25.1* 25.6*  PLT 350 349    BMET Recent Labs    09/11/17 0400 09/12/17 0425  NA 146* 144  K 4.0 3.9  CL 106 107  CO2 32 30  GLUCOSE 131* 111*  BUN 21* 18  CREATININE 0.61 0.55*  CALCIUM 8.9 8.9    Lab Results  Component Value Date   CEA1 2.8 07/15/2017    Studies/Results: No results found.  Medications: I have reviewed the patient's current medications.  Assessment/Plan: 1.Esophagus cancer  Distal esophageal stricture noted on esophagram 07/11/2017  CTs of the chest, abdomen, and pelvis 07/08/2017-right lung  pneumonia, emphysema, no evidence of metastatic disease  Upper endoscopy 07/13/2017-GE junction mass, could not be passed with standard endoscope, biopsy suspicious for adenocarcinoma  Right pleural fluid cytology 07/26/2017-negative  Initiation of weekly Taxol/carboplatin and radiation 08/01/2017, radiation completed 09/12/2017, completed 4 weeks of Taxol/carboplatin chemotherapy-last given 08/31/2017  2.Solid/liquid dysphagia secondary to #1  Placement of jejunostomy feeding tube 07/15/2017, revised 07/20/2017  Esophagram 09/05/2017-complete obstruction at the distal esophagus  3.Weight loss/malnutrition, maintained on jejunostomy tube feedings  4.COPD  5.Heavy alcohol use  6.Gout  7.Hypertension  8.PneumoniaJanuary 2018  9.   Anemia   George Stokes has a history of localized esophagus cancer.  He is not a surgical candidate.  He completed treatment with weekly Taxol/carboplatin chemotherapy and radiation.  The dysphasia appears partially improved, though he had an esophagram last week that confirmed complete distal obstruction.  He was able to participate in a swallowing evaluation 09/05/2017.  He reports he can now swallow secretions and liquids without difficulty.  He also has advanced COPD.  We are hopeful his swallowing status will improve over the next several weeks.  He can maintain his nutrition with tube feedings if he is unable to swallow.  In the absence of known metastatic disease his prognosis is difficult to define, though it is possible he can live for many months or longer.  He may also be a candidate for additional systemic therapy.  It is unclear whether his life limiting diagnosis will be esophagus cancer or COPD.  The anemia is secondary  to chronic disease, malnutrition, chemotherapy/radiation, and potentially chronic GI blood loss.  Recommendations: 1.  Continue tube feedings 2.  Consider repeat esophagram to assess his ability to follow  a liquid or mechanical soft diet 3.  Outpatient follow-up as scheduled at the Cancer center, repeat CBC will be obtained at the cancer center 4.  Hospice referral if he decides against further interventions for esophagus cancer  Please call Oncology as needed     LOS: 3 days   Betsy Coder, MD   09/13/2017, 5:04 PM

## 2017-09-13 NOTE — Progress Notes (Signed)
PROGRESS NOTE  George Stokes ZOX:[id number] DOB: March 20, 1946 DOA: 09/08/2017 PCP: Patient, No Pcp Per  HPI/Recap of past 24 hours: 72 y.o.malewith COPD,Gout,Esophageal cancerwith esophageal obstruction with PEG tube. He presented to hospital after his J tube got out.  09/12/17: Patient seen and examined with his sister at bedside.  He denies any dyspnea, chest pain, or abdominal pain.  He admits to intermittent nausea lasting only a few seconds and resolving after he vomits.  Had one episode of vomiting this morning.  IV Zofran in place.  09/13/2017: Patient seen and examined at his bedside.  He reports his nausea and vomiting is much improved with one episode of vomiting per day rather than multiple episodes per day.  He denies abdominal pain.  Meeting planned today with palliative care team, patient and family, to discuss goals of care.  Assessment/Plan: Principal Problem:   Malfunction of gastrostomy tube [facility]) Active Problems:   Essential hypertension   COPD with hypoxia (HCC)   Chronic respiratory failure with hypoxia (HCC)   Idiopathic chronic gout of multiple sites without tophus   Malignant neoplasm of lower third of esophagus (HCC)   Anemia   Dehydration  J-tube malfunction, resolved Replaced by interventional radiology on 09/09/2017 Speech therapist does not recommend liquids or solids po due to complete obstruction  Failure to thrive Continue tube feeding Dietitian consult to aid in supplements  Hyponatremia: Resolved Sodium 144 on 09/12/2017 On IV fluid hydration normal saline at 50 cc/h  Malignant neoplasm of lower third of esophagus Oncology following  Anemia of chronic disease from esophageal cancer Administer 1 unit PRBC Hemoglobin stable at 8.2 No sign of overt bleeding Transfuse for hemoglobin less than 7  Hypertension blood pressure stable  COPD/chronic respiratory failure with hypoxia Stable Continue O2 supplementation to maintain O2 saturation 92%  or above Continue Dulera Continue Proventil every 6 hours as needed for wheezing  Severe protein calorie malnutrition Albumin 3.0 on 09/09/2017 Continue tube feeding Dietary consult.  Highly appreciated.  Goals of care Palliative care team consult Meeting planned today 09/13/2017 with patient and family and palliative care team  Chronic constipation Continue Senokot   Code Status: Full   Family Communication: None at bedside  Disposition Plan: SNF versus home with hospice possibly tomorrow if no acute events overnight.  Consultants:  Palliative care team   Procedures:  J Tube replacement   Antimicrobials:  None  DVT prophylaxis:  SCDs, subcu Lovenox daily   Objective: Vitals:   09/12/17 2111 09/13/17 0519 09/13/17 0804 09/13/17 1155  BP: 134/76 (!) 129/95  131/87  Pulse: 88 81  83  Resp: 17 18  16   Temp: 98.2 F (36.8 C) 97.9 F (36.6 C)  97.8 F (36.6 C)  TempSrc: Oral Oral  Oral  SpO2: 94% 100% 94% 100%  Weight:      Height:        Intake/Output Summary (Last 24 hours) at 09/13/2017 1417 Last data filed at 09/13/2017 1155 Gross per 24 hour  Intake 0 ml  Output 1400 ml  Net -1400 ml   Filed Weights   09/10/17 0447 09/11/17 0456 09/12/17 0651  Weight: 48.4 kg (106 lb 11.2 oz) 50.2 kg (110 lb 10.7 oz) 51.9 kg (114 lb 6.7 oz)    Exam: 09/13/2017.  Patient seen and examined.  Physical exam is essentially unchanged from prior.  Except for what is mentioned below.   General: 72 year old Caucasian male frail in no acute distress alert and oriented x3   Cardiovascular: Regular  rate and rhythm with no rubs or gallops   Respiratory: Lungs are clear to auscultation with no wheezes or rales   Abdomen: Jtube in place.  No erythema or exudates noted.  Musculoskeletal: No focal deficits  Skin: rash neck, non blancheable  Psychiatry: Mood apropriate    Data Reviewed: CBC: Recent Labs  Lab 09/09/17 0400 09/09/17 1011 09/10/17 0148 09/11/17 0400  09/12/17 0425  WBC 4.8 3.7* 3.8* 3.4* 3.8*  HGB 7.5* 7.3* 8.5* 8.0* 8.2*  HCT 22.6* 22.7* 25.4* 25.1* 25.6*  MCV 97.4 97.0 96.2 97.3 98.1  PLT 340 372 399 350 349   Basic Metabolic Panel: Recent Labs  Lab 09/08/17 2202 09/09/17 0400 09/11/17 0400 09/12/17 0425  NA 141 144 146* 144  K 4.2 4.3 4.0 3.9  CL 99* 102 106 107  CO2 32 33* 32 30  GLUCOSE 92 95 131* 111*  BUN 22* 24* 21* 18  CREATININE 0.64 0.59* 0.61 0.55*  CALCIUM 9.8 9.3 8.9 8.9   GFR: Estimated Creatinine Clearance: 64 mL/min (A) (by C-G formula based on SCr of 0.55 mg/dL (L)). Liver Function Tests: Recent Labs  Lab 09/09/17 0400  AST 16  ALT 12*  ALKPHOS 86  BILITOT 0.7  PROT 5.9*  ALBUMIN 3.0*   No results for input(s): LIPASE, AMYLASE in the last 168 hours. No results for input(s): AMMONIA in the last 168 hours. Coagulation Profile: No results for input(s): INR, PROTIME in the last 168 hours. Cardiac Enzymes: No results for input(s): CKTOTAL, CKMB, CKMBINDEX, TROPONINI in the last 168 hours. BNP (last 3 results) No results for input(s): PROBNP in the last 8760 hours. HbA1C: No results for input(s): HGBA1C in the last 72 hours. CBG: No results for input(s): GLUCAP in the last 168 hours. Lipid Profile: No results for input(s): CHOL, HDL, LDLCALC, TRIG, CHOLHDL, LDLDIRECT in the last 72 hours. Thyroid Function Tests: No results for input(s): TSH, T4TOTAL, FREET4, T3FREE, THYROIDAB in the last 72 hours. Anemia Panel: No results for input(s): VITAMINB12, FOLATE, FERRITIN, TIBC, IRON, RETICCTPCT in the last 72 hours. Urine analysis:    Component Value Date/Time   COLORURINE YELLOW 07/07/2017 2005   APPEARANCEUR CLEAR 07/07/2017 2005   LABSPEC 1.018 07/07/2017 2005   PHURINE 5.0 07/07/2017 2005   GLUCOSEU NEGATIVE 07/07/2017 2005   HGBUR NEGATIVE 07/07/2017 2005   BILIRUBINUR NEGATIVE 07/07/2017 2005   KETONESUR 20 (A) 07/07/2017 2005   PROTEINUR NEGATIVE 07/07/2017 2005   NITRITE NEGATIVE  07/07/2017 2005   LEUKOCYTESUR TRACE (A) 07/07/2017 2005   Sepsis Labs: @LABRCNTIP (procalcitonin:4,lacticidven:4)  ) Recent Results (from the past 240 hour(s))  MRSA PCR Screening     Status: Abnormal   Collection Time: 09/09/17  1:28 AM  Result Value Ref Range Status   MRSA by PCR POSITIVE (A) NEGATIVE Final    Comment:        The GeneXpert MRSA Assay (FDA approved for NASAL specimens only), is one component of a comprehensive MRSA colonization surveillance program. It is not intended to diagnose MRSA infection nor to guide or monitor treatment for MRSA infections. RESULT CALLED TO, READ BACK BY AND VERIFIED WITH: Carmela Rima RN [id number] 09/09/17 A NAVARRO Performed at [facility], [address], [place], [place] [address]       Studies: No results found.  Scheduled Meds: . chlorhexidine  15 mL Mouth Rinse BID  . Chlorhexidine Gluconate Cloth  6 each Topical Q0600  . clotrimazole   Topical BID  . enoxaparin (LOVENOX) injection  40 mg  Subcutaneous QHS  . feeding supplement (PRO-STAT SUGAR FREE 64)  30 mL Oral BID WC  . mouth rinse  15 mL Mouth Rinse q12n4p  . mometasone-formoterol  2 puff Inhalation BID  . mupirocin ointment  1 application Nasal BID  . sennosides  5 mL Oral QHS  . sodium chloride flush  3 mL Intravenous Q12H    Continuous Infusions: . sodium chloride    . sodium chloride 75 mL/hr at 09/12/17 0150  . feeding supplement (OSMOLITE 1.2 CAL) 1,000 mL (09/12/17 0333)     LOS: 3 days     Darlin Drop, MD [facility] Pager [phone]  If 7PM-7AM, please contact night-coverage [email or web address] Password [email or web address] 09/13/2017, 2:17 PM

## 2017-09-13 NOTE — Telephone Encounter (Signed)
Received call from [facility], palliative care NP to inform MD pt is inpatient at [facility]. Dr. Benay Spice made aware. He will see pt.

## 2017-09-13 NOTE — Care Management Important Message (Addendum)
Important Message  Patient DetailsIM Letter given to Nora/Case Manager to present to the Patient  Name: George Stokes MRN: [id number] Date of Birth: 1945-08-31   Medicare Important Message Given:  Yes    Kerin Salen 09/13/2017, 12:09 [place] Message  Patient Details  Name: George Stokes MRN: [id number] Date of Birth: 1946/05/25   Medicare Important Message Given:  Yes    Kerin Salen 09/13/2017, 12:09 PM

## 2017-09-13 NOTE — Progress Notes (Signed)
PMT to meet with patient and sister at 41 for [place] conversation.   NO CHARGE  Ihor Dow, FNP-C Palliative Medicine Team  Phone: [phone] Fax: [phone]

## 2017-09-13 NOTE — Progress Notes (Signed)
PT Cancellation Note  Patient Details Name: George Stokes MRN: [id number] DOB: September 20, 1945   Cancelled Treatment:    Reason Eval/Treat Not Completed: Other (comment)GOC meeting today. Will check back another time.   [name] 09/13/2017, 12:52 PM Tresa Endo PT [phone]

## 2017-09-14 DIAGNOSIS — K9423 Gastrostomy malfunction: Secondary | ICD-10-CM | POA: Diagnosis not present

## 2017-09-14 DIAGNOSIS — K2210 Ulcer of esophagus without bleeding: Secondary | ICD-10-CM | POA: Diagnosis not present

## 2017-09-14 DIAGNOSIS — K208 Other esophagitis: Secondary | ICD-10-CM | POA: Diagnosis not present

## 2017-09-14 DIAGNOSIS — D001 Carcinoma in situ of esophagus: Secondary | ICD-10-CM | POA: Diagnosis not present

## 2017-09-14 DIAGNOSIS — Y842 Radiological procedure and radiotherapy as the cause of abnormal reaction of the patient, or of later complication, without mention of misadventure at the time of the procedure: Secondary | ICD-10-CM | POA: Diagnosis not present

## 2017-09-14 DIAGNOSIS — K9413 Enterostomy malfunction: Secondary | ICD-10-CM | POA: Diagnosis not present

## 2017-09-14 DIAGNOSIS — E43 Unspecified severe protein-calorie malnutrition: Secondary | ICD-10-CM | POA: Diagnosis not present

## 2017-09-14 DIAGNOSIS — K222 Esophageal obstruction: Secondary | ICD-10-CM | POA: Diagnosis not present

## 2017-09-14 DIAGNOSIS — R109 Unspecified abdominal pain: Secondary | ICD-10-CM | POA: Diagnosis not present

## 2017-09-14 DIAGNOSIS — I10 Essential (primary) hypertension: Secondary | ICD-10-CM | POA: Diagnosis not present

## 2017-09-14 DIAGNOSIS — D649 Anemia, unspecified: Secondary | ICD-10-CM | POA: Diagnosis not present

## 2017-09-14 DIAGNOSIS — C159 Malignant neoplasm of esophagus, unspecified: Secondary | ICD-10-CM | POA: Diagnosis not present

## 2017-09-14 DIAGNOSIS — K228 Other specified diseases of esophagus: Secondary | ICD-10-CM | POA: Diagnosis not present

## 2017-09-14 DIAGNOSIS — T66XXXA Radiation sickness, unspecified, initial encounter: Secondary | ICD-10-CM | POA: Diagnosis not present

## 2017-09-14 DIAGNOSIS — R1110 Vomiting, unspecified: Secondary | ICD-10-CM | POA: Diagnosis not present

## 2017-09-14 DIAGNOSIS — R1319 Other dysphagia: Secondary | ICD-10-CM | POA: Diagnosis not present

## 2017-09-14 DIAGNOSIS — Z9981 Dependence on supplemental oxygen: Secondary | ICD-10-CM | POA: Diagnosis not present

## 2017-09-14 DIAGNOSIS — Z79891 Long term (current) use of opiate analgesic: Secondary | ICD-10-CM | POA: Diagnosis not present

## 2017-09-14 DIAGNOSIS — J984 Other disorders of lung: Secondary | ICD-10-CM | POA: Diagnosis not present

## 2017-09-14 DIAGNOSIS — E785 Hyperlipidemia, unspecified: Secondary | ICD-10-CM | POA: Diagnosis not present

## 2017-09-14 DIAGNOSIS — R1310 Dysphagia, unspecified: Secondary | ICD-10-CM | POA: Diagnosis not present

## 2017-09-14 DIAGNOSIS — R41841 Cognitive communication deficit: Secondary | ICD-10-CM | POA: Diagnosis not present

## 2017-09-14 DIAGNOSIS — R2689 Other abnormalities of gait and mobility: Secondary | ICD-10-CM | POA: Diagnosis not present

## 2017-09-14 DIAGNOSIS — R918 Other nonspecific abnormal finding of lung field: Secondary | ICD-10-CM | POA: Diagnosis not present

## 2017-09-14 DIAGNOSIS — K319 Disease of stomach and duodenum, unspecified: Secondary | ICD-10-CM | POA: Diagnosis not present

## 2017-09-14 DIAGNOSIS — Y658 Other specified misadventures during surgical and medical care: Secondary | ICD-10-CM | POA: Diagnosis not present

## 2017-09-14 DIAGNOSIS — Z923 Personal history of irradiation: Secondary | ICD-10-CM | POA: Diagnosis not present

## 2017-09-14 DIAGNOSIS — Z8501 Personal history of malignant neoplasm of esophagus: Secondary | ICD-10-CM | POA: Diagnosis not present

## 2017-09-14 DIAGNOSIS — F411 Generalized anxiety disorder: Secondary | ICD-10-CM | POA: Diagnosis not present

## 2017-09-14 DIAGNOSIS — T85528A Displacement of other gastrointestinal prosthetic devices, implants and grafts, initial encounter: Secondary | ICD-10-CM | POA: Diagnosis not present

## 2017-09-14 DIAGNOSIS — R69 Illness, unspecified: Secondary | ICD-10-CM | POA: Diagnosis not present

## 2017-09-14 DIAGNOSIS — Z434 Encounter for attention to other artificial openings of digestive tract: Secondary | ICD-10-CM | POA: Diagnosis not present

## 2017-09-14 DIAGNOSIS — R278 Other lack of coordination: Secondary | ICD-10-CM | POA: Diagnosis not present

## 2017-09-14 DIAGNOSIS — T451X5A Adverse effect of antineoplastic and immunosuppressive drugs, initial encounter: Secondary | ICD-10-CM | POA: Diagnosis not present

## 2017-09-14 DIAGNOSIS — R112 Nausea with vomiting, unspecified: Secondary | ICD-10-CM | POA: Diagnosis not present

## 2017-09-14 DIAGNOSIS — C801 Malignant (primary) neoplasm, unspecified: Secondary | ICD-10-CM | POA: Diagnosis not present

## 2017-09-14 DIAGNOSIS — M6281 Muscle weakness (generalized): Secondary | ICD-10-CM | POA: Diagnosis not present

## 2017-09-14 DIAGNOSIS — F1010 Alcohol abuse, uncomplicated: Secondary | ICD-10-CM | POA: Diagnosis not present

## 2017-09-14 DIAGNOSIS — R531 Weakness: Secondary | ICD-10-CM | POA: Diagnosis not present

## 2017-09-14 DIAGNOSIS — E559 Vitamin D deficiency, unspecified: Secondary | ICD-10-CM | POA: Diagnosis not present

## 2017-09-14 DIAGNOSIS — R634 Abnormal weight loss: Secondary | ICD-10-CM | POA: Diagnosis not present

## 2017-09-14 DIAGNOSIS — Z515 Encounter for palliative care: Secondary | ICD-10-CM | POA: Diagnosis not present

## 2017-09-14 DIAGNOSIS — J9611 Chronic respiratory failure with hypoxia: Secondary | ICD-10-CM | POA: Diagnosis not present

## 2017-09-14 DIAGNOSIS — T83498A Other mechanical complication of other prosthetic devices, implants and grafts of genital tract, initial encounter: Secondary | ICD-10-CM | POA: Diagnosis not present

## 2017-09-14 DIAGNOSIS — Z87891 Personal history of nicotine dependence: Secondary | ICD-10-CM | POA: Diagnosis not present

## 2017-09-14 DIAGNOSIS — Z934 Other artificial openings of gastrointestinal tract status: Secondary | ICD-10-CM | POA: Diagnosis not present

## 2017-09-14 DIAGNOSIS — J439 Emphysema, unspecified: Secondary | ICD-10-CM | POA: Diagnosis not present

## 2017-09-14 DIAGNOSIS — R1314 Dysphagia, pharyngoesophageal phase: Secondary | ICD-10-CM | POA: Diagnosis not present

## 2017-09-14 DIAGNOSIS — R633 Feeding difficulties: Secondary | ICD-10-CM | POA: Diagnosis not present

## 2017-09-14 DIAGNOSIS — Z9221 Personal history of antineoplastic chemotherapy: Secondary | ICD-10-CM | POA: Diagnosis not present

## 2017-09-14 DIAGNOSIS — Z7189 Other specified counseling: Secondary | ICD-10-CM | POA: Diagnosis not present

## 2017-09-14 DIAGNOSIS — E46 Unspecified protein-calorie malnutrition: Secondary | ICD-10-CM | POA: Diagnosis not present

## 2017-09-14 DIAGNOSIS — L89150 Pressure ulcer of sacral region, unstageable: Secondary | ICD-10-CM | POA: Diagnosis not present

## 2017-09-14 DIAGNOSIS — C155 Malignant neoplasm of lower third of esophagus: Secondary | ICD-10-CM | POA: Diagnosis not present

## 2017-09-14 DIAGNOSIS — Z681 Body mass index (BMI) 19 or less, adult: Secondary | ICD-10-CM | POA: Diagnosis not present

## 2017-09-14 DIAGNOSIS — Z79899 Other long term (current) drug therapy: Secondary | ICD-10-CM | POA: Diagnosis not present

## 2017-09-14 DIAGNOSIS — F0390 Unspecified dementia without behavioral disturbance: Secondary | ICD-10-CM | POA: Diagnosis not present

## 2017-09-14 DIAGNOSIS — K3189 Other diseases of stomach and duodenum: Secondary | ICD-10-CM | POA: Diagnosis not present

## 2017-09-14 DIAGNOSIS — E039 Hypothyroidism, unspecified: Secondary | ICD-10-CM | POA: Diagnosis not present

## 2017-09-14 DIAGNOSIS — S3092XA Unspecified superficial injury of abdominal wall, initial encounter: Secondary | ICD-10-CM | POA: Diagnosis not present

## 2017-09-14 DIAGNOSIS — K9419 Other complications of enterostomy: Secondary | ICD-10-CM | POA: Diagnosis not present

## 2017-09-14 DIAGNOSIS — J189 Pneumonia, unspecified organism: Secondary | ICD-10-CM | POA: Diagnosis not present

## 2017-09-14 DIAGNOSIS — J449 Chronic obstructive pulmonary disease, unspecified: Secondary | ICD-10-CM | POA: Diagnosis not present

## 2017-09-14 LAB — CBC
HEMATOCRIT: 25.6 % — AB (ref 39.0–52.0)
HEMOGLOBIN: 8.4 g/dL — AB (ref 13.0–17.0)
MCH: 32.2 pg (ref 26.0–34.0)
MCHC: 32.8 g/dL (ref 30.0–36.0)
MCV: 98.1 fL (ref 78.0–100.0)
Platelets: 341 10*3/uL (ref 150–400)
RBC: 2.61 MIL/uL — ABNORMAL LOW (ref 4.22–5.81)
RDW: 19 % — ABNORMAL HIGH (ref 11.5–15.5)
WBC: 3.8 10*3/uL — AB (ref 4.0–10.5)

## 2017-09-14 LAB — BASIC METABOLIC PANEL
ANION GAP: 7 (ref 5–15)
BUN: 16 mg/dL (ref 6–20)
CALCIUM: 8.5 mg/dL — AB (ref 8.9–10.3)
CHLORIDE: 105 mmol/L (ref 101–111)
CO2: 29 mmol/L (ref 22–32)
Creatinine, Ser: 0.53 mg/dL — ABNORMAL LOW (ref 0.61–1.24)
GFR calc non Af Amer: >60 mL/min (ref >60)
Glucose, Bld: 124 mg/dL — ABNORMAL HIGH (ref 65–99)
Potassium: 3.7 mmol/L (ref 3.5–5.1)
Sodium: 141 mmol/L (ref 135–145)

## 2017-09-14 MED ORDER — METOCLOPRAMIDE HCL 10 MG/10ML PO SOLN
5.0000 mg | Freq: Three times a day (TID) | ORAL | Status: DC
  Filled 2017-09-14 (×2): qty 10

## 2017-09-14 MED ORDER — PRO-STAT SUGAR FREE PO LIQD: 30.0000 mL | Freq: Two times a day (BID) | ORAL | 0 refills | Status: AC

## 2017-09-14 MED ORDER — ACETAMINOPHEN 325 MG PO TABS: 650.0000 mg | ORAL_TABLET | Freq: Four times a day (QID) | ORAL | Status: AC | PRN

## 2017-09-14 MED ORDER — OXYCODONE HCL 10 MG PO TABS: 10.0000 mg | ORAL_TABLET | ORAL | 0 refills | Status: AC | PRN

## 2017-09-14 MED ORDER — ADULT MULTIVITAMIN LIQUID CH: 15.0000 mL | Freq: Every day | ORAL | 1 refills | Status: AC

## 2017-09-14 MED ORDER — METOCLOPRAMIDE HCL 10 MG/10ML PO SOLN: 5.0000 mg | Freq: Three times a day (TID) | ORAL | 0 refills | Status: AC

## 2017-09-14 MED ORDER — SENNOSIDES 8.8 MG/5ML PO SYRP: 5.0000 mL | ORAL_SOLUTION | Freq: Every day | ORAL | 0 refills | Status: AC

## 2017-09-14 NOTE — Discharge Summary (Signed)
[facility]  Physician Discharge Summary   Patient ID: George Stokes MRN: [id number] DOB/AGE: Jul 26, 1945 72 y.o.  Admit date: 09/08/2017 Discharge date: 09/14/2017  PCP: Patient, No Pcp Per  DISCHARGE DIAGNOSES:  Principal Problem:   Malfunction of gastrostomy tube ([place]) Active Problems:   Essential hypertension   COPD with hypoxia ([place])   Chronic respiratory failure with hypoxia ([facility])   Idiopathic chronic gout of multiple sites without tophus   Esophageal stenosis   Malignant neoplasm of lower third of esophagus ([facility])   Protein-calorie malnutrition ([place])   Anemia   Dehydration   Palliative care by specialist   Goals of care, counseling/discussion   RECOMMENDATIONS FOR OUTPATIENT FOLLOW UP: 1. Patient to keep his follow-up appointments at the cancer center for PET scan and blood work and follow-up with his oncologist.  Please see below.   DISCHARGE CONDITION: fair  Diet recommendation:  Tube feedings only as outlined below.  Strict n.p.o. otherwise.  Filed Weights   09/10/17 0447 09/11/17 0456 09/12/17 0651  Weight: 48.4 kg (106 lb 11.2 oz) 50.2 kg (110 lb 10.7 oz) 51.9 kg (114 lb 6.7 oz)    INITIAL HISTORY: 72 y.o.malewith COPD,Gout,Esophageal cancerwith esophageal obstruction with PEG tube. He presented to hospital after his J tube got out.  He also had a few episodes of nausea and vomiting.  Consultations:  Oncology  Palliative medicine  Interventional radiology  Procedures:  J-tube replacement  HOSPITAL COURSE:   J-tube malfunction Replaced by interventional radiology on 09/09/2017 Speech therapist does not recommend liquids or solids po due to complete obstruction in the distal esophagus noted on esophagogram on 09/05/17.  Could consider repeating this in 1 week or so.   Failure to thrive Secondary to cancer and poor nutritional status.  Continue tube feeding.  Hyponatremia He was given IV fluids with improvement in sodium levels.      Malignant neoplasm of lower third of esophagus Oncology following.  Discussions regarding hospice ongoing.  Outpatient follow-up.  Anemia of chronic disease from esophageal cancer He was administered 1 unit PRBC. Hemoglobin stable at 8.2. No sign of overt bleeding.  Essential hypertension Blood pressure stable  COPD/chronic respiratory failure with hypoxia Stable. Continue O2 supplementation to maintain O2 saturation 92% or above.  Continue home medications.  Severe protein calorie malnutrition Continue tube feeding  Goals of care Patient was seen by palliative medicine.  He remains full code for now.  Further discussions to be held with his oncologist.  Chronic constipation Bowel regimen.  Nausea and vomiting Most likely due to his malignancy.  Seems to have improved.  Low-dose Reglan through his tube to prevent high residual.   Overall stable.  Okay for discharge back to his skilled nursing facility today.   PERTINENT LABS:  The results of significant diagnostics from this hospitalization (including imaging, microbiology, ancillary and laboratory) are listed below for reference.    Microbiology: Recent Results (from the past 240 hour(s))  MRSA PCR Screening     Status: Abnormal   Collection Time: 09/09/17  1:28 AM  Result Value Ref Range Status   MRSA by PCR POSITIVE (A) NEGATIVE Final    Comment:        The GeneXpert MRSA Assay (FDA approved for NASAL specimens only), is one component of a comprehensive MRSA colonization surveillance program. It is not intended to diagnose MRSA infection nor to guide or monitor treatment for MRSA infections. RESULT CALLED TO, READ BACK BY AND VERIFIED WITH: Melvenia Beam RN [id number] 09/09/17 A NAVARRO  [facility]  Physician Discharge Summary   Patient ID: George Stokes MRN: [id number] DOB/AGE: Jul 26, 1945 72 y.o.  Admit date: 09/08/2017 Discharge date: 09/14/2017  PCP: Patient, No Pcp Per  DISCHARGE DIAGNOSES:  Principal Problem:   Malfunction of gastrostomy tube ([place]) Active Problems:   Essential hypertension   COPD with hypoxia ([place])   Chronic respiratory failure with hypoxia ([facility])   Idiopathic chronic gout of multiple sites without tophus   Esophageal stenosis   Malignant neoplasm of lower third of esophagus ([facility])   Protein-calorie malnutrition ([place])   Anemia   Dehydration   Palliative care by specialist   Goals of care, counseling/discussion   RECOMMENDATIONS FOR OUTPATIENT FOLLOW UP: 1. Patient to keep his follow-up appointments at the cancer center for PET scan and blood work and follow-up with his oncologist.  Please see below.   DISCHARGE CONDITION: fair  Diet recommendation:  Tube feedings only as outlined below.  Strict n.p.o. otherwise.  Filed Weights   09/10/17 0447 09/11/17 0456 09/12/17 0651  Weight: 48.4 kg (106 lb 11.2 oz) 50.2 kg (110 lb 10.7 oz) 51.9 kg (114 lb 6.7 oz)    INITIAL HISTORY: 72 y.o.malewith COPD,Gout,Esophageal cancerwith esophageal obstruction with PEG tube. He presented to hospital after his J tube got out.  He also had a few episodes of nausea and vomiting.  Consultations:  Oncology  Palliative medicine  Interventional radiology  Procedures:  J-tube replacement  HOSPITAL COURSE:   J-tube malfunction Replaced by interventional radiology on 09/09/2017 Speech therapist does not recommend liquids or solids po due to complete obstruction in the distal esophagus noted on esophagogram on 09/05/17.  Could consider repeating this in 1 week or so.   Failure to thrive Secondary to cancer and poor nutritional status.  Continue tube feeding.  Hyponatremia He was given IV fluids with improvement in sodium levels.      Malignant neoplasm of lower third of esophagus Oncology following.  Discussions regarding hospice ongoing.  Outpatient follow-up.  Anemia of chronic disease from esophageal cancer He was administered 1 unit PRBC. Hemoglobin stable at 8.2. No sign of overt bleeding.  Essential hypertension Blood pressure stable  COPD/chronic respiratory failure with hypoxia Stable. Continue O2 supplementation to maintain O2 saturation 92% or above.  Continue home medications.  Severe protein calorie malnutrition Continue tube feeding  Goals of care Patient was seen by palliative medicine.  He remains full code for now.  Further discussions to be held with his oncologist.  Chronic constipation Bowel regimen.  Nausea and vomiting Most likely due to his malignancy.  Seems to have improved.  Low-dose Reglan through his tube to prevent high residual.   Overall stable.  Okay for discharge back to his skilled nursing facility today.   PERTINENT LABS:  The results of significant diagnostics from this hospitalization (including imaging, microbiology, ancillary and laboratory) are listed below for reference.    Microbiology: Recent Results (from the past 240 hour(s))  MRSA PCR Screening     Status: Abnormal   Collection Time: 09/09/17  1:28 AM  Result Value Ref Range Status   MRSA by PCR POSITIVE (A) NEGATIVE Final    Comment:        The GeneXpert MRSA Assay (FDA approved for NASAL specimens only), is one component of a comprehensive MRSA colonization surveillance program. It is not intended to diagnose MRSA infection nor to guide or monitor treatment for MRSA infections. RESULT CALLED TO, READ BACK BY AND VERIFIED WITH: Melvenia Beam RN [phone] 09/09/17 A NAVARRO  [facility]  Physician Discharge Summary   Patient ID: George Stokes MRN: [id number] DOB/AGE: Jul 26, 1945 72 y.o.  Admit date: 09/08/2017 Discharge date: 09/14/2017  PCP: Patient, No Pcp Per  DISCHARGE DIAGNOSES:  Principal Problem:   Malfunction of gastrostomy tube ([place]) Active Problems:   Essential hypertension   COPD with hypoxia ([place])   Chronic respiratory failure with hypoxia ([facility])   Idiopathic chronic gout of multiple sites without tophus   Esophageal stenosis   Malignant neoplasm of lower third of esophagus ([facility])   Protein-calorie malnutrition ([place])   Anemia   Dehydration   Palliative care by specialist   Goals of care, counseling/discussion   RECOMMENDATIONS FOR OUTPATIENT FOLLOW UP: 1. Patient to keep his follow-up appointments at the cancer center for PET scan and blood work and follow-up with his oncologist.  Please see below.   DISCHARGE CONDITION: fair  Diet recommendation:  Tube feedings only as outlined below.  Strict n.p.o. otherwise.  Filed Weights   09/10/17 0447 09/11/17 0456 09/12/17 0651  Weight: 48.4 kg (106 lb 11.2 oz) 50.2 kg (110 lb 10.7 oz) 51.9 kg (114 lb 6.7 oz)    INITIAL HISTORY: 72 y.o.malewith COPD,Gout,Esophageal cancerwith esophageal obstruction with PEG tube. He presented to hospital after his J tube got out.  He also had a few episodes of nausea and vomiting.  Consultations:  Oncology  Palliative medicine  Interventional radiology  Procedures:  J-tube replacement  HOSPITAL COURSE:   J-tube malfunction Replaced by interventional radiology on 09/09/2017 Speech therapist does not recommend liquids or solids po due to complete obstruction in the distal esophagus noted on esophagogram on 09/05/17.  Could consider repeating this in 1 week or so.   Failure to thrive Secondary to cancer and poor nutritional status.  Continue tube feeding.  Hyponatremia He was given IV fluids with improvement in sodium levels.      Malignant neoplasm of lower third of esophagus Oncology following.  Discussions regarding hospice ongoing.  Outpatient follow-up.  Anemia of chronic disease from esophageal cancer He was administered 1 unit PRBC. Hemoglobin stable at 8.2. No sign of overt bleeding.  Essential hypertension Blood pressure stable  COPD/chronic respiratory failure with hypoxia Stable. Continue O2 supplementation to maintain O2 saturation 92% or above.  Continue home medications.  Severe protein calorie malnutrition Continue tube feeding  Goals of care Patient was seen by palliative medicine.  He remains full code for now.  Further discussions to be held with his oncologist.  Chronic constipation Bowel regimen.  Nausea and vomiting Most likely due to his malignancy.  Seems to have improved.  Low-dose Reglan through his tube to prevent high residual.   Overall stable.  Okay for discharge back to his skilled nursing facility today.   PERTINENT LABS:  The results of significant diagnostics from this hospitalization (including imaging, microbiology, ancillary and laboratory) are listed below for reference.    Microbiology: Recent Results (from the past 240 hour(s))  MRSA PCR Screening     Status: Abnormal   Collection Time: 09/09/17  1:28 AM  Result Value Ref Range Status   MRSA by PCR POSITIVE (A) NEGATIVE Final    Comment:        The GeneXpert MRSA Assay (FDA approved for NASAL specimens only), is one component of a comprehensive MRSA colonization surveillance program. It is not intended to diagnose MRSA infection nor to guide or monitor treatment for MRSA infections. RESULT CALLED TO, READ BACK BY AND VERIFIED WITH: Melvenia Beam RN 3295 09/09/17 A NAVARRO  Performed at [facility] [place] [address], [place], [place] [address]      Labs: Basic Metabolic Panel: Recent Labs  Lab 09/08/17 2202 09/09/17 0400 09/11/17 0400 09/12/17 0425 09/14/17 0419  NA  141 144 146* 144 141  K 4.2 4.3 4.0 3.9 3.7  CL 99* 102 106 107 105  CO2 32 33* 32 30 29  GLUCOSE 92 95 131* 111* 124*  BUN 22* 24* 21* 18 16  CREATININE 0.64 0.59* 0.61 0.55* 0.53*  CALCIUM 9.8 9.3 8.9 8.9 8.5*   Liver Function Tests: Recent Labs  Lab 09/09/17 0400  AST 16  ALT 12*  ALKPHOS 86  BILITOT 0.7  PROT 5.9*  ALBUMIN 3.0*   CBC: Recent Labs  Lab 09/09/17 1011 09/10/17 0148 09/11/17 0400 09/12/17 0425 09/14/17 0419  WBC 3.7* 3.8* 3.4* 3.8* 3.8*  HGB 7.3* 8.5* 8.0* 8.2* 8.4*  HCT 22.7* 25.4* 25.1* 25.6* 25.6*  MCV 97.0 96.2 97.3 98.1 98.1  PLT 372 399 350 349 341     IMAGING STUDIES Dg Op Swallowing Func-medicare/speech Path  Result Date: 09/05/2017 Objective Swallowing Evaluation: Type of Study: MBS-Modified Barium Swallow Study  Patient Details Name: George Stokes MRN: [id number] Date of Birth: [birth year] Today's Date: 09/05/2017 Time: SLP Start Time (ACUTE ONLY): 1420 -SLP Stop Time (ACUTE ONLY): 1445 SLP Time Calculation (min) (ACUTE ONLY): 25 min Past Medical History: Past Medical History: Diagnosis Date . COPD (chronic obstructive pulmonary disease) ([place])  . Dyspnea  . esophageal ca dx'd 07/13/17  mass at GE junction . Esophageal mass  . Gout  . Heart murmur   during teenage years  . Hypertension  . On home oxygen therapy   1-2 L/M  . Pneumonia  Past Surgical History: Past Surgical History: Procedure Laterality Date . ESOPHAGOGASTRODUODENOSCOPY (EGD) WITH PROPOFOL N/A 07/13/2017  Procedure: ESOPHAGOGASTRODUODENOSCOPY (EGD) WITH PROPOFOL;  Surgeon: Mauri Pole, MD;  Location: [facility];  Service: Endoscopy;  Laterality: N/A; . IR REPLC DUODEN/JEJUNO TUBE PERCUT W/FLUORO  08/16/2017 . LAPAROTOMY N/A 07/15/2017  Procedure: FEEDING JEJUNOSTOMY TUBE PLACEMENT;  Surgeon: Fanny Skates, MD;  Location: [facility];  Service: General;  Laterality: N/A; . LAPAROTOMY N/A 07/20/2017  Procedure: EXPLORATORY LAPAROTOMY; REVISION OF JEJUNOSTOMY;  Surgeon: Excell Seltzer, MD;   Location: [facility];  Service: General;  Laterality: N/A; . TONSILLECTOMY   HPI: pt is a 72 yo male referred for OP MBS and esophagram.  PMH + for esophageal cancer s/p Jtube placement.  Jtube recently replaced as it was accidentally removed.   An UGI study was attempted 07/26/17 but unable to pass NG into stomach and pt aspirated barium that was distilled into tube.  GE junction mass noted and pt was diagnosed with esophageal cancer.  Pt is currently receiving radiation.  He completed week 3 Taxol/carboplatin 08/18/2017 per oncology notes. Pt reported intermittent "regurgitation"/vomiting to his oncology team.  He desires to eat and SNF SLP recommended MBS, esophagram to follow.  Subjective: pt awake in chair Assessment / Plan / Recommendation CHL IP CLINICAL IMPRESSIONS 09/05/2017 Clinical Impression Mild oropharyngeal dysphagia characterized mostly by decreased epiglottic deflection/tongue base retraction resulting in vallecular more than pyriform sinus residuals.  Cued strong swallow also helpful to migitate residuals.  Pt largely senses residuals and cued dry and liquid swallows decrease residuals.  Pt did have very TRACE aspiration of thin mixed with secretions during swallow *just below vocal folds* due to decreased laryngeal closure.  Cued cough effective to remove trace aspirates.   Pt educated to findings/recommendations using  Performed at [facility] [place] [address], [place], [place] [address]      Labs: Basic Metabolic Panel: Recent Labs  Lab 09/08/17 2202 09/09/17 0400 09/11/17 0400 09/12/17 0425 09/14/17 0419  NA  141 144 146* 144 141  K 4.2 4.3 4.0 3.9 3.7  CL 99* 102 106 107 105  CO2 32 33* 32 30 29  GLUCOSE 92 95 131* 111* 124*  BUN 22* 24* 21* 18 16  CREATININE 0.64 0.59* 0.61 0.55* 0.53*  CALCIUM 9.8 9.3 8.9 8.9 8.5*   Liver Function Tests: Recent Labs  Lab 09/09/17 0400  AST 16  ALT 12*  ALKPHOS 86  BILITOT 0.7  PROT 5.9*  ALBUMIN 3.0*   CBC: Recent Labs  Lab 09/09/17 1011 09/10/17 0148 09/11/17 0400 09/12/17 0425 09/14/17 0419  WBC 3.7* 3.8* 3.4* 3.8* 3.8*  HGB 7.3* 8.5* 8.0* 8.2* 8.4*  HCT 22.7* 25.4* 25.1* 25.6* 25.6*  MCV 97.0 96.2 97.3 98.1 98.1  PLT 372 399 350 349 341     IMAGING STUDIES Dg Op Swallowing Func-medicare/speech Path  Result Date: 09/05/2017 Objective Swallowing Evaluation: Type of Study: MBS-Modified Barium Swallow Study  Patient Details Name: George Stokes MRN: [id number] Date of Birth: [birth year] Today's Date: 09/05/2017 Time: SLP Start Time (ACUTE ONLY): 1420 -SLP Stop Time (ACUTE ONLY): 1445 SLP Time Calculation (min) (ACUTE ONLY): 25 min Past Medical History: Past Medical History: Diagnosis Date . COPD (chronic obstructive pulmonary disease) ([place])  . Dyspnea  . esophageal ca dx'd 07/13/17  mass at GE junction . Esophageal mass  . Gout  . Heart murmur   during teenage years  . Hypertension  . On home oxygen therapy   1-2 L/M  . Pneumonia  Past Surgical History: Past Surgical History: Procedure Laterality Date . ESOPHAGOGASTRODUODENOSCOPY (EGD) WITH PROPOFOL N/A 07/13/2017  Procedure: ESOPHAGOGASTRODUODENOSCOPY (EGD) WITH PROPOFOL;  Surgeon: Mauri Pole, MD;  Location: [facility];  Service: Endoscopy;  Laterality: N/A; . IR REPLC DUODEN/JEJUNO TUBE PERCUT W/FLUORO  08/16/2017 . LAPAROTOMY N/A 07/15/2017  Procedure: FEEDING JEJUNOSTOMY TUBE PLACEMENT;  Surgeon: Fanny Skates, MD;  Location: [facility];  Service: General;  Laterality: N/A; . LAPAROTOMY N/A 07/20/2017  Procedure: EXPLORATORY LAPAROTOMY; REVISION OF JEJUNOSTOMY;  Surgeon: Excell Seltzer, MD;   Location: [facility];  Service: General;  Laterality: N/A; . TONSILLECTOMY   HPI: pt is a 72 yo male referred for OP MBS and esophagram.  PMH + for esophageal cancer s/p Jtube placement.  Jtube recently replaced as it was accidentally removed.   An UGI study was attempted 07/26/17 but unable to pass NG into stomach and pt aspirated barium that was distilled into tube.  GE junction mass noted and pt was diagnosed with esophageal cancer.  Pt is currently receiving radiation.  He completed week 3 Taxol/carboplatin 08/18/2017 per oncology notes. Pt reported intermittent "regurgitation"/vomiting to his oncology team.  He desires to eat and SNF SLP recommended MBS, esophagram to follow.  Subjective: pt awake in chair Assessment / Plan / Recommendation CHL IP CLINICAL IMPRESSIONS 09/05/2017 Clinical Impression Mild oropharyngeal dysphagia characterized mostly by decreased epiglottic deflection/tongue base retraction resulting in vallecular more than pyriform sinus residuals.  Cued strong swallow also helpful to migitate residuals.  Pt largely senses residuals and cued dry and liquid swallows decrease residuals.  Pt did have very TRACE aspiration of thin mixed with secretions during swallow *just below vocal folds* due to decreased laryngeal closure.  Cued cough effective to remove trace aspirates.   Pt educated to findings/recommendations using  Performed at [facility] [place] [address], [place], [place] [address]      Labs: Basic Metabolic Panel: Recent Labs  Lab 09/08/17 2202 09/09/17 0400 09/11/17 0400 09/12/17 0425 09/14/17 0419  NA  141 144 146* 144 141  K 4.2 4.3 4.0 3.9 3.7  CL 99* 102 106 107 105  CO2 32 33* 32 30 29  GLUCOSE 92 95 131* 111* 124*  BUN 22* 24* 21* 18 16  CREATININE 0.64 0.59* 0.61 0.55* 0.53*  CALCIUM 9.8 9.3 8.9 8.9 8.5*   Liver Function Tests: Recent Labs  Lab 09/09/17 0400  AST 16  ALT 12*  ALKPHOS 86  BILITOT 0.7  PROT 5.9*  ALBUMIN 3.0*   CBC: Recent Labs  Lab 09/09/17 1011 09/10/17 0148 09/11/17 0400 09/12/17 0425 09/14/17 0419  WBC 3.7* 3.8* 3.4* 3.8* 3.8*  HGB 7.3* 8.5* 8.0* 8.2* 8.4*  HCT 22.7* 25.4* 25.1* 25.6* 25.6*  MCV 97.0 96.2 97.3 98.1 98.1  PLT 372 399 350 349 341     IMAGING STUDIES Dg Op Swallowing Func-medicare/speech Path  Result Date: 09/05/2017 Objective Swallowing Evaluation: Type of Study: MBS-Modified Barium Swallow Study  Patient Details Name: George Stokes MRN: [id number] Date of Birth: [birth year] Today's Date: 09/05/2017 Time: SLP Start Time (ACUTE ONLY): 1420 -SLP Stop Time (ACUTE ONLY): 1445 SLP Time Calculation (min) (ACUTE ONLY): 25 min Past Medical History: Past Medical History: Diagnosis Date . COPD (chronic obstructive pulmonary disease) ([place])  . Dyspnea  . esophageal ca dx'd 07/13/17  mass at GE junction . Esophageal mass  . Gout  . Heart murmur   during teenage years  . Hypertension  . On home oxygen therapy   1-2 L/M  . Pneumonia  Past Surgical History: Past Surgical History: Procedure Laterality Date . ESOPHAGOGASTRODUODENOSCOPY (EGD) WITH PROPOFOL N/A 07/13/2017  Procedure: ESOPHAGOGASTRODUODENOSCOPY (EGD) WITH PROPOFOL;  Surgeon: Mauri Pole, MD;  Location: [facility];  Service: Endoscopy;  Laterality: N/A; . IR REPLC DUODEN/JEJUNO TUBE PERCUT W/FLUORO  08/16/2017 . LAPAROTOMY N/A 07/15/2017  Procedure: FEEDING JEJUNOSTOMY TUBE PLACEMENT;  Surgeon: Fanny Skates, MD;  Location: [facility];  Service: General;  Laterality: N/A; . LAPAROTOMY N/A 07/20/2017  Procedure: EXPLORATORY LAPAROTOMY; REVISION OF JEJUNOSTOMY;  Surgeon: Excell Seltzer, MD;   Location: [facility];  Service: General;  Laterality: N/A; . TONSILLECTOMY   HPI: pt is a 72 yo male referred for OP MBS and esophagram.  PMH + for esophageal cancer s/p Jtube placement.  Jtube recently replaced as it was accidentally removed.   An UGI study was attempted 07/26/17 but unable to pass NG into stomach and pt aspirated barium that was distilled into tube.  GE junction mass noted and pt was diagnosed with esophageal cancer.  Pt is currently receiving radiation.  He completed week 3 Taxol/carboplatin 08/18/2017 per oncology notes. Pt reported intermittent "regurgitation"/vomiting to his oncology team.  He desires to eat and SNF SLP recommended MBS, esophagram to follow.  Subjective: pt awake in chair Assessment / Plan / Recommendation CHL IP CLINICAL IMPRESSIONS 09/05/2017 Clinical Impression Mild oropharyngeal dysphagia characterized mostly by decreased epiglottic deflection/tongue base retraction resulting in vallecular more than pyriform sinus residuals.  Cued strong swallow also helpful to migitate residuals.  Pt largely senses residuals and cued dry and liquid swallows decrease residuals.  Pt did have very TRACE aspiration of thin mixed with secretions during swallow *just below vocal folds* due to decreased laryngeal closure.  Cued cough effective to remove trace aspirates.   Pt educated to findings/recommendations using

## 2017-09-14 NOTE — Evaluation (Signed)
Physical Therapy Evaluation Patient Details Name: George Stokes MRN: [id number] DOB: August 18, 1945 Today's Date: 09/14/2017   History of Present Illness  72 y.o. male with COPD, Gout, CRF, Esophageal cancer with esophageal obstruction with PEG tube. He presented to hospital after his J tube got out. IR replaced on 3/15.  Clinical Impression  Patient presents with decreased independence with mobility due to weakness, decreased balance and decreased activity tolerance.  He will benefit from skilled PT in the acute setting to allow return home alone following SNF level rehab stay.     Follow Up Recommendations SNF    Equipment Recommendations  Rolling walker with 5" wheels    Recommendations for Other Services       Precautions / Restrictions Precautions Precautions: Fall      Mobility  Bed Mobility Overal bed mobility: Modified Independent                Transfers Overall transfer level: Needs assistance Equipment used: Rolling walker (2 wheeled) Transfers: Sit to/from Stand Sit to Stand: Min guard         General transfer comment: UE support for transfer, assist for balance  Ambulation/Gait Ambulation/Gait assistance: Min guard Ambulation Distance (Feet): 100 Feet Assistive device: Rolling walker (2 wheeled) Gait Pattern/deviations: Step-through pattern;Decreased stride length     General Gait Details: good pace, some dyspnea ambulating on RA, SpO2 on 2L at rest 95%, on RA at rest 93%, with ambulation 88%. replaced O2 at rest  Stairs            Wheelchair Mobility    Modified Rankin (Stroke Patients Only)       Balance Overall balance assessment: Needs assistance   Sitting balance-Leahy Scale: Good       Standing balance-Leahy Scale: Fair                               Pertinent Vitals/Pain Pain Assessment: No/denies pain    Home Living Family/patient expects to be discharged to:: Skilled nursing facility Living Arrangements:  Alone                    Prior Function Level of Independence: Independent with assistive device(s)         Comments: prior to admission in January was independent using cane to ambulate and leaning on grocery cart in store.  Sponge bathed and was sedentary (on computer 12 hours a day) marginally cleaned     Hand Dominance        Extremity/Trunk Assessment   Upper Extremity Assessment Upper Extremity Assessment: Generalized weakness    Lower Extremity Assessment Lower Extremity Assessment: Generalized weakness       Communication   Communication: HOH  Cognition Arousal/Alertness: Awake/alert Behavior During Therapy: WFL for tasks assessed/performed Overall Cognitive Status: Within Functional Limits for tasks assessed                                        General Comments      Exercises     Assessment/Plan    PT Assessment Patient needs continued PT services  PT Problem List Decreased strength;Decreased mobility;Decreased activity tolerance;Decreased balance;Decreased knowledge of use of DME       PT Treatment Interventions DME instruction;Therapeutic activities;Gait training;Therapeutic exercise;Patient/family education;Balance training;Functional mobility training    PT Goals (Current goals can be found in  the Care Plan section)  Acute Rehab PT Goals Patient Stated Goal: Thinks plan is to return to rehab PT Goal Formulation: With patient Time For Goal Achievement: 09/28/17 Potential to Achieve Goals: Good    Frequency Min 3X/week   Barriers to discharge        Co-evaluation               AM-PAC PT "6 Clicks" Daily Activity  Outcome Measure Difficulty turning over in bed (including adjusting bedclothes, sheets and blankets)?: A Little   Difficulty sitting down on and standing up from a chair with arms (e.g., wheelchair, bedside commode, etc,.)?: Unable Help needed moving to and from a bed to chair (including a  wheelchair)?: A Little Help needed walking in hospital room?: A Little Help needed climbing 3-5 steps with a railing? : A Little 6 Click Score: 13    End of Session Equipment Utilized During Treatment: Oxygen Activity Tolerance: Patient limited by fatigue(SOB) Patient left: in chair;with nursing/sitter in room;with call bell/phone within reach   PT Visit Diagnosis: Other abnormalities of gait and mobility (R26.89);Muscle weakness (generalized) (M62.81)    Time: 3710-6269 PT Time Calculation (min) (ACUTE ONLY): 16 min   Charges:   PT Evaluation $PT Eval Moderate Complexity: 1 Mod     PT G CodesSheran Lawless, [place] [phone] 09/14/2017   Elray Mcgregor 09/14/2017, 10:26 AM

## 2017-09-14 NOTE — Progress Notes (Signed)
Daily Progress Note   Patient Name: George Stokes       Date: 09/14/2017 DOB: December 15, 1945  Age: 72 y.o. MRN#: [id number] Attending Physician: Osvaldo Shipper, MD Primary Care Physician: Patient, No Pcp Per Admit Date: 09/08/2017  Reason for Consultation/Follow-up: Establishing goals of care  Subjective: Patient awake, alert and oriented. Sitting on side of bed bathing himself. Denies pain or discomfort.   F/u with sister, [name] via telephone. She is appreciative of our conversation yesterday and recalls her conversation with Dr. Truett Perna yesterday evening. May be further treatment options. [name] confirms that they plan to continue f/u with outpatient oncology. She again states importance of "quality of life" for her brother. I encouraged she continue conversations with her brother regarding his GOC and wishes. 'Hope for the best, but also prepare for the worst.' [name] is interested in further conversations with outpatient palliative provider. I encouraged her to contact Shaymus's PCP for referral. Emotional support provided.    Length of Stay: 4  Current Medications: Scheduled Meds:  . chlorhexidine  15 mL Mouth Rinse BID  . Chlorhexidine Gluconate Cloth  6 each Topical Q0600  . clotrimazole   Topical BID  . enoxaparin (LOVENOX) injection  40 mg Subcutaneous QHS  . feeding supplement (PRO-STAT SUGAR FREE 64)  30 mL Oral BID WC  . mouth rinse  15 mL Mouth Rinse q12n4p  . metoCLOPramide  5 mg Per Tube Q8H  . mometasone-formoterol  2 puff Inhalation BID  . mupirocin ointment  1 application Nasal BID  . sennosides  5 mL Oral QHS  . sodium chloride flush  3 mL Intravenous Q12H    Continuous Infusions: . sodium chloride    . sodium chloride 50 mL/hr at 09/13/17 1502  . feeding supplement  (OSMOLITE 1.2 CAL) 1,000 mL (09/13/17 1618)    PRN Meds: sodium chloride, acetaminophen **OR** acetaminophen, albuterol, heparin lock flush, hydrALAZINE, HYDROmorphone (DILAUDID) injection, lidocaine, metoprolol tartrate, ondansetron (ZOFRAN) IV, oxyCODONE, sodium chloride flush  Physical Exam          Vital Signs: BP 135/90 (BP Location: Right Arm)   Pulse 85   Temp 97.9 F (36.6 C) (Oral)   Resp 18   Ht 5\' 7"  (1.702 m)   Wt 51.9 kg (114 lb 6.7 oz)   SpO2 100%   BMI 17.92 kg/m  SpO2: SpO2: 100 % O2 Device: O2 Device: Nasal Cannula O2 Flow Rate: O2 Flow Rate (L/min): 4 L/min  Intake/output summary:   Intake/Output Summary (Last 24 hours) at 09/14/2017 1418 Last data filed at 09/14/2017 1311 Gross per 24 hour  Intake -  Output 850 ml  Net -850 ml   LBM: Last BM Date: 09/08/17 Baseline Weight: Weight: 52.2 kg (115 lb) Most recent weight: Weight: 51.9 kg (114 lb 6.7 oz)       Palliative Assessment/Data: PPS 50%   Flowsheet Rows     Most Recent Value  Intake Tab  Referral Department  Hospitalist  Unit at Time of Referral  Med/Surg Unit  Palliative Care Primary Diagnosis  Cancer  Palliative Care Type  New Palliative care  Reason for referral  Clarify Goals of Care  Date first seen by Palliative Care  09/13/17  Clinical Assessment  Palliative Performance Scale Score  50%  Psychosocial & Spiritual Assessment  Palliative Care Outcomes  Patient/Family meeting held?  Yes  Who was at the meeting?  patient and sister  Palliative Care Outcomes  Clarified goals of care, Provided psychosocial or spiritual support, Provided end of life care assistance, Counseled regarding hospice, ACP counseling assistance      Patient Active Problem List   Diagnosis Date Noted  . Palliative care by specialist   . Goals of care, counseling/discussion   . Dehydration 09/10/2017  . Anemia 09/09/2017  . Malfunction of gastrostomy tube (HCC) 09/08/2017  . Pressure injury of skin 08/01/2017   . Atelectasis of right lung   . H/O exploratory laparotomy 07/20/2017  . Acute respiratory failure (HCC) 07/20/2017  . Protein-calorie malnutrition (HCC) 07/16/2017  . Malignant neoplasm of lower third of esophagus (HCC)   . Esophageal dysphagia   . Esophageal stenosis   . Esophageal stricture   . Protein-calorie malnutrition, severe (HCC) 07/12/2017  . Hypomagnesemia   . Hypokalemia   . Lobar pneumonia (HCC)   . Intractable nausea and vomiting 07/08/2017  . CAP (community acquired pneumonia) 07/08/2017  . Acute kidney injury (HCC)   . Intractable vomiting with nausea 06/29/2017  . Sleep disturbance 01/04/2017  . Idiopathic chronic gout of multiple sites without tophus 01/04/2017  . Chronic respiratory failure with hypoxia (HCC) 01/13/2016  . COPD with hypoxia (HCC) 05/03/2014  . Gout 10/30/2011  . Essential hypertension 10/30/2011  . Dyslipidemia 10/30/2011    Palliative Care Assessment & Plan   Patient Profile: 72 y.o. male  with past medical history of esophageal cancer with esophageal obstruction, pneumonia, COPD on home oxygen, HTN, heart murmur, and gout admitted on 09/08/2017 after PEG tube had fallen out at [facility]. Followed by oncology outpatient for esophageal cancer. Upper endoscopy on 07/13/17 with GE junction mass that could not be passed with standard endoscope. Biopsy suspicious for adenocarcinoma. He has completed three weekly treatments with Taxol/carboplatin. Patient was scheduled to complete radiation on 09/08/17. Esophagram on 09/05/17 revealed complete obstruction within distal esophagus. PEG was replaced by IR on 3/15. Receiving tube feeding. Also with anemia of chronic disease from underlying esophageal cancer. Palliative medicine consultation for goals of care.   Assessment: J-tube malfunction Esophageal cancer of lower third of esophagus Anemia of chronic disease COPD/chronic respiratory failure with hypoxia Severe protein calorie malnutrition Failure to  thrive  Recommendations/Plan:  Continue FULL code/FULL scope.  Patient/sister confirm plan to continue outpatient oncology follow-up.  Back to SNF today for rehab. May benefit from outpatient palliative f/u.   Goals of Care and Additional Recommendations:  Limitations on Scope of Treatment: Full Scope Treatment  Code Status: FULL   Code Status Orders  (From admission, onward)        Start     Ordered   09/09/17 0130  Full code  Continuous     09/09/17 0129    Code Status History    Date Active Date Inactive Code Status Order ID Comments User Context   07/20/2017 19:48 08/02/2017 20:45 Full Code [id number]  Gigi Gin, MD Inpatient   07/15/2017 18:34 07/20/2017 19:48 Full Code [id number]  Claud Kelp, MD Inpatient   07/08/2017 09:54 07/15/2017 18:33 Full Code [id number]  Haydee Monica, MD ED       Prognosis:   Unable to determine  Discharge Planning:  Skilled Nursing Facility for rehab with Palliative care service follow-up  Care plan was discussed with patient and sister [name])  Thank you for allowing the Palliative Medicine Team to assist in the care of this patient.   Time In: 1350 Time Out: 1415 Total Time Prolonged Time Billed  no      Greater than 50%  of this time was spent counseling and coordinating care related to the above assessment and plan.  Vennie Homans, FNP-C Palliative Medicine Team  Phone: [phone] Fax: [phone]  Please contact Palliative Medicine Team phone at [phone] for questions and concerns.

## 2017-09-14 NOTE — Progress Notes (Signed)
   09/14/17 1143  Clinical Encounter Type  Visited With Patient  Visit Type Initial  Referral From Nurse  Consult/Referral To Chaplain  Spiritual Encounters  Spiritual Needs Literature   Responded to a SCC for HCPA.  Patient was in the bed and invited me in.  He said his memory is not so great right now.  I explained the documents to him and he said his sister was coming back before she leaves town and he wanted to talk with her.  I indicated that we are here to support them. Left the HCPA at bedside per the patients request.  Will follow and support as needed. Chaplain Katherene Ponto

## 2017-09-14 NOTE — Progress Notes (Signed)
Pt returning to [facility] for continued rehab at [place] today- report [id number].  Sister reports that meeting with palliative team & oncologist yesterday was productive and they have decided to continue full scope of treatment at this time and will continue to have discussions with providers as treatment progresses if at any time pt ready to pursue hospice/palliative goals instead.  DC information provided via the [facility].  Arranged [facility] transportation.   Sharren Bridge, MSW, LCSW Clinical Social Work 09/14/2017 [phone]

## 2017-09-14 NOTE — Progress Notes (Signed)
Nutrition Follow-up  DOCUMENTATION CODES:   Severe malnutrition in context of chronic illness  INTERVENTION:  Continue Osmolite 1.5 @ 55 mL/hr.    30 mL Prostat QID, provides 400 calories, 60 grams of protein.  TFregimen will provide 2380 kcal (103% upper end of estimated kcal need), 143 grams of protein(100% estimated protein needs), and 1003 mL free water.   NUTRITION DIAGNOSIS:   Severe Malnutrition related to cancer and cancer related treatments as evidenced by severe muscle depletion, severe fat depletion, percent weight loss.  Ongoing  GOAL:   Patient will meet greater than or equal to 90% of their needs  Met  MONITOR:   Weight trends, TF tolerance, Labs, Skin  REASON FOR ASSESSMENT:   Consult Enteral/tube feeding initiation and management  ASSESSMENT:   72 yo male with PCM, COPD on O2 1-2L, anemia, Stg II pressure injuries, and esophageal cancer (on chemo/radiation) admitted from SNF on 3/14 to re-place a malfunctioning gastrostomy tube. PMH HTN, Gout.   3/19 Per MD progress notes  Pt's Hyponatremia is resolve  Pt is being considered for hospice care. Ongoing discussions with palliative and oncology as pt cancer has not responded to chemo/radiation  An esophagram shows complete obstruction distally and SLP does not recommend liquids or solids po due to complete obstruction Spoke with pt who reports  He is still sipping on water and coke   He has been vomiting regularly (2-3x's in past 12 hours), but denies that there is connection between emesis and consumption of liquids  Pt is also constipated and reported that he has not had a BM in 2-3 days  Pt has been started on tube feedings as of 3/15:   Osmolite 1.2 @ 45m/hr Q 4 hr; total volume 3343 mL with 664mflush  Prostat 3053mID  Weight taken at bedside: 54.6 kg (120.4 lb) increased by 6 lbs since 3/18  Medications:  Senokot; IV NaCl _0 /hr  Labs reviewed.   Diet Order:  Diet NPO time  specified Except for: Sips with Meds  EDUCATION NEEDS:   No education needs have been identified at this time  Skin:  Skin Assessment: Skin Integrity Issues: Skin Integrity Issues:: Stage II, DTI DTI: sacrum Stage II: sacrum; lt buttock  Last BM:  3/14  Height:   Ht Readings from Last 1 Encounters:  09/09/17 _1  (1.702 m)    Weight:   Wt Readings from Last 1 Encounters:  09/12/17 114 lb 6.7 oz (51.9 kg)    Ideal Body Weight:  67.2 kg  BMI:  Body mass index is 17.92 kg/m.  Estimated Nutritional Needs:   Kcal:  2100-2300 kcal (43-46 kcal/kg ABW)  Protein:  136-149 grams (2.3-2.5 g/kg repletion cachexia)  Fluid:  >2.1 L/d    CheEdmonia Lynchetetic Intern Pager: [phone]

## 2017-09-15 DIAGNOSIS — J449 Chronic obstructive pulmonary disease, unspecified: Secondary | ICD-10-CM | POA: Diagnosis not present

## 2017-09-15 DIAGNOSIS — C159 Malignant neoplasm of esophagus, unspecified: Secondary | ICD-10-CM | POA: Diagnosis not present

## 2017-09-15 DIAGNOSIS — I10 Essential (primary) hypertension: Secondary | ICD-10-CM | POA: Diagnosis not present

## 2017-09-15 DIAGNOSIS — K222 Esophageal obstruction: Secondary | ICD-10-CM | POA: Diagnosis not present

## 2017-09-16 DIAGNOSIS — E43 Unspecified severe protein-calorie malnutrition: Secondary | ICD-10-CM | POA: Diagnosis not present

## 2017-09-16 DIAGNOSIS — R1310 Dysphagia, unspecified: Secondary | ICD-10-CM | POA: Diagnosis not present

## 2017-09-16 DIAGNOSIS — R112 Nausea with vomiting, unspecified: Secondary | ICD-10-CM | POA: Diagnosis not present

## 2017-09-16 DIAGNOSIS — F0390 Unspecified dementia without behavioral disturbance: Secondary | ICD-10-CM | POA: Diagnosis not present

## 2017-09-16 DIAGNOSIS — J9611 Chronic respiratory failure with hypoxia: Secondary | ICD-10-CM | POA: Diagnosis not present

## 2017-09-16 DIAGNOSIS — F411 Generalized anxiety disorder: Secondary | ICD-10-CM | POA: Diagnosis not present

## 2017-09-16 DIAGNOSIS — C159 Malignant neoplasm of esophagus, unspecified: Secondary | ICD-10-CM | POA: Diagnosis not present

## 2017-09-16 DIAGNOSIS — J449 Chronic obstructive pulmonary disease, unspecified: Secondary | ICD-10-CM | POA: Diagnosis not present

## 2017-09-16 DIAGNOSIS — I10 Essential (primary) hypertension: Secondary | ICD-10-CM | POA: Diagnosis not present

## 2017-09-19 ENCOUNTER — Ambulatory Visit (HOSPITAL_COMMUNITY)
Admit: 2017-09-19 | Discharge: 2017-09-19 | Disposition: A | Discharge status: Home / Self Care | Payer: No Typology Code available for payment source | Attending: Radiation Oncology | Admitting: Radiation Oncology

## 2017-09-19 DIAGNOSIS — J439 Emphysema, unspecified: Secondary | ICD-10-CM | POA: Insufficient documentation

## 2017-09-19 DIAGNOSIS — J984 Other disorders of lung: Secondary | ICD-10-CM | POA: Insufficient documentation

## 2017-09-19 DIAGNOSIS — C159 Malignant neoplasm of esophagus, unspecified: Secondary | ICD-10-CM | POA: Diagnosis not present

## 2017-09-19 DIAGNOSIS — R918 Other nonspecific abnormal finding of lung field: Secondary | ICD-10-CM | POA: Diagnosis not present

## 2017-09-19 LAB — GLUCOSE, CAPILLARY: GLUCOSE-CAPILLARY: 106 mg/dL — AB (ref 65–99)

## 2017-09-19 MED ORDER — FLUDEOXYGLUCOSE F - 18 (FDG) INJECTION: 5.7000 | Freq: Once | INTRAVENOUS | Status: DC | PRN

## 2017-09-19 MED ORDER — FLUDEOXYGLUCOSE F - 18 (FDG) INJECTION
5.7000 | Freq: Once | INTRAVENOUS | Status: AC | PRN
  Administered 2017-09-19: 5.7 via INTRAVENOUS

## 2017-09-20 ENCOUNTER — Inpatient Hospital Stay: Payer: No Typology Code available for payment source

## 2017-09-20 ENCOUNTER — Inpatient Hospital Stay (HOSPITAL_BASED_OUTPATIENT_CLINIC_OR_DEPARTMENT_OTHER): Payer: No Typology Code available for payment source | Admitting: Oncology

## 2017-09-20 ENCOUNTER — Ambulatory Visit: Payer: Medicare Other

## 2017-09-20 ENCOUNTER — Telehealth: Payer: Self-pay | Admitting: Oncology

## 2017-09-20 VITALS — BP 145/93 | HR 91 | Temp 98.1°F | Resp 17 | Ht 67.0 in | Wt 113.8 lb

## 2017-09-20 DIAGNOSIS — D649 Anemia, unspecified: Secondary | ICD-10-CM

## 2017-09-20 DIAGNOSIS — I10 Essential (primary) hypertension: Secondary | ICD-10-CM | POA: Diagnosis not present

## 2017-09-20 DIAGNOSIS — R1310 Dysphagia, unspecified: Secondary | ICD-10-CM | POA: Diagnosis not present

## 2017-09-20 DIAGNOSIS — C155 Malignant neoplasm of lower third of esophagus: Secondary | ICD-10-CM

## 2017-09-20 LAB — CBC WITH DIFFERENTIAL (CANCER CENTER ONLY)
BASOS ABS: 0 10*3/uL (ref 0.0–0.1)
Band Neutrophils: 0 %
Basophils Relative: 0 %
Blasts: 0 %
EOS PCT: 0 %
Eosinophils Absolute: 0 10*3/uL (ref 0.0–0.5)
HCT: 31.1 % — ABNORMAL LOW (ref 38.4–49.9)
Hemoglobin: 10.1 g/dL — ABNORMAL LOW (ref 13.0–17.1)
LYMPHS ABS: 0.3 10*3/uL — AB (ref 0.9–3.3)
Lymphocytes Relative: 3 %
MCH: 31.8 pg (ref 27.2–33.4)
MCHC: 32.5 g/dL (ref 32.0–36.0)
MCV: 97.8 fL (ref 79.3–98.0)
Metamyelocytes Relative: 0 %
Monocytes Absolute: 1.9 10*3/uL — ABNORMAL HIGH (ref 0.1–0.9)
Monocytes Relative: 17 %
Myelocytes: 0 %
Neutro Abs: 9.1 10*3/uL — ABNORMAL HIGH (ref 1.5–6.5)
Neutrophils Relative %: 80 %
Other: 0 %
PLATELETS: 394 10*3/uL (ref 140–400)
Promyelocytes Absolute: 0 %
RBC: 3.18 MIL/uL — AB (ref 4.20–5.82)
RDW: 18.7 % — ABNORMAL HIGH (ref 11.0–14.6)
WBC: 11.3 10*3/uL — AB (ref 4.0–10.3)
nRBC: 0 /100 WBC

## 2017-09-20 NOTE — Progress Notes (Addendum)
Nutrition Assessment   Reason for Assessment:  Patient identified on Malnutrition Screening report for weight loss  ASSESSMENT:  72 year old male with esophageal cancer. Placement of J-tube on 07/15/2017, revised on 07/20/17 due to dysphagia with solids and liquids.  J-tube recently fell out and replaced on 3/15 (16 [place] J-tube placed under fluoro per note).  Patient with COPD, HTN, gout, dyslipidemia.  Noted patient with complete obstruction on esophagram on 3/11.   Patient has been receiving chemotherapy weekly and radiation.  Radiation completed on 3/18, completed 4 weeks of chemotherapy last given on 3/6.    Met with patient following MD appointment.  Friend is with patient as well. Patient unable to tell RD what type the name of tube feeding formula is called.  Friend gives medication list and note osmolite 1.5 69m/hr with prostat 350m2 times per day.  5029mater q 4 hours noted as well.  Patient reports constipation for 3 days but had good, normal bowel movement this am.  Continues to have regurgitation/vomiting during the day. Reports that he sips water and has ice chips during the day.    Patient reports prior to hospital admission in  (06/2017) unable to take po solids due to nausea, vomiting (regurgitation) for 10 days prior to admission.    Nutrition Focused Physical Exam: deferred  Medications: reviewed  Labs: reviewed  Anthropometrics:   Height: 67 inches Weight: 113 lb 12.8 oz UBW: 150 lb per patient maybe 6 months ago BMI: 17   25% weight loss in the last 6 months, significant  Estimated Energy Needs  Kcals: 1600-1800 calories/d Protein: 78-104 g/d Fluid: 1.8 L/d  NUTRITION DIAGNOSIS: Inadequate oral intake related to esophageal mass as evidenced by 25% weight loss and placement of J-tube for nutrition due to unable to take po solids and liquids   INTERVENTION:   Called [facility] times 3 today to speak with RD.  2 times was disconnected and  never spoke with RD.  3rd time spoke with EliBenjamine Molautritionist as RD not available today. Per EliBenjamine Molatient weight on admission was 131 pounds at the beginning of Feb 2019, weight has dropped and RD has been adjusting feeding as patient was missing tube feeding due to being at treatment. Tube feeding regimen in Feb. 60m69m of osmolite 1.5 with prostat 30 ml 3 times per day. Tube feeding adjusted from 5pm -9am of osmolite 1.5 at 70ml82mwith prostat 30ml 25mmes per day.   Current tube feeding regimen at SNF osmolite 1.5 at 80ml/h34mom 3pm-9am with prostat 30ml 3 8ms per day. Water flush of 50ml/hr 94mng time tube feeding is running.   This provides 2460 calories/d, 135 g protein, 1994ml free10mer(flush and formula).    Spoke with Dr. Sherrill aBenay Spicelanning further chemotherapy at this time.  Will refer patient back to GI for possible esophageal dilation     MONITORING, EVALUATION, GOAL: Patient will receive all of nutrition via J-tube to maintain weight and hydration   NEXT VISIT: as needed   Reyana Leisey B. Matheus Spiker, RD,Zenia ResidesReCerritosstHowardd Dietitian [phone]

## 2017-09-20 NOTE — Progress Notes (Signed)
[place] OFFICE PROGRESS NOTE   Diagnosis: Esophagus cancer  INTERVAL HISTORY:   [name] returns as scheduled.  He was admitted 09/08/2017 after the feeding tube fell out.  The feeding tube was replaced.  He continues tube feedings. He vomits approximately 5 times per day.  He is not eating or drinking.  He describes the vomiting as "regurgitating ".  He does not have significant nausea.  No pain.  An esophagram on 09/05/2017 revealed complete obstruction in the distal esophagus. Objective:  Vital signs in last 24 hours:  Blood pressure (!) 145/93, pulse 91, temperature 98.1 F (36.7 C), resp. rate 17, height 5\' 7"  (1.702 m), SpO2 94 %.    HEENT: Neck without mass Lymphatics: No cervical or supraclavicular nodes Resp: Distant breath sounds, diminished breath sounds at the left greater than right chest, no respiratory distress Cardio: Regular rate and rhythm GI: Nontender, no hepatomegaly, left upper quadrant feeding tube site without evidence of infection Vascular: No leg edema   Lab Results:  Lab Results  Component Value Date   WBC 11.3 (H) 09/20/2017   HGB 8.4 (L) 09/14/2017   HCT 31.1 (L) 09/20/2017   MCV 97.8 09/20/2017   PLT 394 09/20/2017   NEUTROABS 9.1 (H) 09/20/2017    CMP     Component Value Date/Time   NA 141 09/14/2017 0419   NA 143 06/29/2017 1508   K 3.7 09/14/2017 0419   CL 105 09/14/2017 0419   CO2 29 09/14/2017 0419   GLUCOSE 124 (H) 09/14/2017 0419   BUN 16 09/14/2017 0419   BUN 28 (H) 06/29/2017 1508   CREATININE 0.53 (L) 09/14/2017 0419   CREATININE 0.70 08/31/2017 1040   CREATININE 1.07 05/01/2015 1508   CALCIUM 8.5 (L) 09/14/2017 0419   PROT 5.9 (L) 09/09/2017 0400   PROT 7.3 06/29/2017 1508   ALBUMIN 3.0 (L) 09/09/2017 0400   ALBUMIN 4.5 06/29/2017 1508   AST 16 09/09/2017 0400   AST 12 08/31/2017 1040   ALT 12 (L) 09/09/2017 0400   ALT 10 08/31/2017 1040   ALKPHOS 86 09/09/2017 0400   BILITOT 0.7 09/09/2017  0400   BILITOT 0.4 08/31/2017 1040   GFRNONAA >60 09/14/2017 0419   GFRNONAA >60 08/31/2017 1040   GFRNONAA 71 05/01/2015 1508   GFRAA >60 09/14/2017 0419   GFRAA >60 08/31/2017 1040   GFRAA 83 05/01/2015 1508    Lab Results  Component Value Date   CEA1 2.8 07/15/2017    Lab Results  Component Value Date   INR 1.06 07/11/2017    Imaging:  Nm Pet Image Initial (pi) Skull Base To Thigh  Result Date: 09/19/2017 CLINICAL DATA:  Initial treatment strategy for esophageal cancer. EXAM: NUCLEAR MEDICINE PET SKULL BASE TO THIGH TECHNIQUE: 5.7 mCi F-18 FDG was injected intravenously. Full-ring PET imaging was performed from the skull base to thigh after the radiotracer. CT data was obtained and used for attenuation correction and anatomic localization. Fasting blood glucose: 106 mg/dl Mediastinal blood pool activity: SUV max 1.68 COMPARISON:  Chest CT 07/08/2017 and abdominal CT 07/20/2017 FINDINGS: NECK: No hypermetabolic lymph nodes in the neck. Incidental CT findings: Left-sided maxillary sinus disease noted. CHEST: Hypermetabolic distal esophageal mass with SUV max of 9.49. No paraesophageal lymphadenopathy. No enlarged or hypermetabolic mediastinal or hilar lymph nodes. Airspace opacity in the right lower lobe with air bronchograms and surrounding interstitial change demonstrates week hypermetabolism at SUV max of 2.0. This is most likely an infectious or an inflammatory process. The  effusion seen on the prior chest CT has resolved. 9 mm nodular density at the left lung base just above the hemidiaphragm is mildly hypermetabolic with SUV max of 6.29. This was not present on the prior chest CT but could be subpleural atelectasis or reactive scarring changes. Attention on future scans is suggested. 7 mm at the right lung base in the right middle lobe just above the hemidiaphragm is weakly hypermetabolic with SUV max of 1.4. This is indeterminate. It was not definitely present on the prior CT.  Underlying emphysema and pulmonary scarring. Incidental CT findings: Dilated esophagus with fluid fluid level with dependent layering contrast material. ABDOMEN/PELVIS: No abnormal hypermetabolic activity within the liver, pancreas, adrenal glands, or spleen. No hypermetabolic lymph nodes in the abdomen or pelvis. Incidental CT findings: Feeding jejunostomy tube in place. No complicating features. Small anterior abdominal wall hernia. SKELETON: No focal hypermetabolic activity to suggest skeletal metastasis. Incidental CT findings: none IMPRESSION: 1. Distal esophageal mass is hypermetabolic and consistent with neoplasm. No paraesophageal adenopathy. 2. Nodules at both lung bases are indeterminate. Recommend surveillance. Repeat chest CT in 3 months is suggested. 3. No findings for metastatic disease involving the abdomen/pelvis. 4. Stable emphysematous changes and pulmonary scarring changes. Right lower lobe inflammatory process suggested. Electronically Signed   By: Marijo Sanes M.D.   On: 09/19/2017 12:43    Medications: I have reviewed the patient's current medications.   Assessment/Plan: 1.Esophagus cancer  Distal esophageal stricture noted on esophagram 07/11/2017  CTs of the chest, abdomen, and pelvis 07/08/2017-right lung pneumonia, emphysema, no evidence of metastatic disease  Upper endoscopy 07/13/2017-GE junction mass, could not be passed with standard endoscope, biopsy suspicious for adenocarcinoma  Right pleural fluid cytology 07/26/2017-negative  Initiation of weekly Taxol/carboplatin and radiation 08/01/2017, radiation completed 09/12/2017, completed 4 weeks of Taxol/carboplatin chemotherapy-last given 08/31/2017  PET scan 11/23/4130 hypermetabolic distal esophagus mass, no lymphadenopathy, airspace opacity in the right lower lobe with low-level hypermetabolism, nodules at the lung bases with low level hypermetabolism-indeterminate  2.Solid/liquid dysphagia secondary to  #1  Placement of jejunostomy feeding tube 07/15/2017, revised 07/20/2017  Esophagram 09/05/2017-complete obstruction at the distal esophagus  3.Weight loss/malnutrition, maintained on jejunostomy tube feedings  4.COPD  5.Heavy alcohol use  6.Gout  7.Hypertension  8.PneumoniaJanuary 2018  9.   Anemia  Disposition:  GeorgeStokes appears unchanged.  The restaging PET scan reveals no clear evidence of metastatic esophagus cancer.  He continues to have hypermetabolism at the distal esophagus.  He has clinical and x-ray evidence of esophagus obstruction.  I will refer him to GI to consider the indication for a repeat endoscopy and esophagus dilatation as indicated.  He will return for an office visit in approximately 2 weeks.  He will continue tube feedings in order to maintain his nutrition.    Betsy Coder, MD  09/20/2017  1:26 PM

## 2017-09-20 NOTE — Telephone Encounter (Signed)
Scheduled appt per 3/26 los - Gave patient calender.

## 2017-09-21 DIAGNOSIS — L89150 Pressure ulcer of sacral region, unstageable: Secondary | ICD-10-CM | POA: Diagnosis not present

## 2017-09-22 ENCOUNTER — Telehealth: Payer: Self-pay | Admitting: *Deleted

## 2017-09-22 ENCOUNTER — Telehealth: Payer: Self-pay

## 2017-09-22 NOTE — Telephone Encounter (Signed)
CALLED PATIENT'S BROTHER DAVID TO RESCHEDULE FU FOR 10-18-17 DUE TO ALISON PERKINS BEING OUT OF THE OFFICE, RESCHEDULED FOR 10-25-17 @ 3:30 PM, LVM FOR A RETURN CALL

## 2017-09-22 NOTE — Telephone Encounter (Signed)
Call from sister Diane asking about plan and current updates on pt care. No answer, LVM to return call to clinic.

## 2017-09-23 DIAGNOSIS — R634 Abnormal weight loss: Secondary | ICD-10-CM | POA: Diagnosis not present

## 2017-09-23 DIAGNOSIS — K222 Esophageal obstruction: Secondary | ICD-10-CM | POA: Diagnosis not present

## 2017-09-23 DIAGNOSIS — C159 Malignant neoplasm of esophagus, unspecified: Secondary | ICD-10-CM | POA: Diagnosis not present

## 2017-09-23 DIAGNOSIS — J449 Chronic obstructive pulmonary disease, unspecified: Secondary | ICD-10-CM | POA: Diagnosis not present

## 2017-09-27 ENCOUNTER — Telehealth: Payer: Self-pay

## 2017-09-27 NOTE — Telephone Encounter (Signed)
Spoke with pt sister regarding update on pt condition. Per Dr. Benay Spice, pt has persistent obstruction of esophagus, unsure if it is completely related to radiation or cancer. Referring to GI to evaluate further. Sister voiced understanding. Has further questions for MD, will make aware.

## 2017-09-28 ENCOUNTER — Telehealth: Payer: Self-pay | Admitting: Gastroenterology

## 2017-09-28 ENCOUNTER — Other Ambulatory Visit: Payer: Self-pay

## 2017-09-28 DIAGNOSIS — R1310 Dysphagia, unspecified: Secondary | ICD-10-CM

## 2017-09-28 DIAGNOSIS — R1319 Other dysphagia: Secondary | ICD-10-CM

## 2017-09-28 DIAGNOSIS — L89150 Pressure ulcer of sacral region, unstageable: Secondary | ICD-10-CM | POA: Diagnosis not present

## 2017-09-28 NOTE — Telephone Encounter (Signed)
Dr. Benay Spice returned call to pt's sister. She reports he has not been scheduled with GI yet. Called [facility] to follow up. Left message for Dr. Woodward Ku nurse to call back.

## 2017-09-28 NOTE — Telephone Encounter (Signed)
Arrangements made with Venda Rodes to bring Mr George Stokes here to see Dr Silverio Decamp 09/29/17 at 10:45 am.  His EGD is scheduled at [facility] 10/10/17. He will receive instructions at the appointment tomorrow.

## 2017-09-29 ENCOUNTER — Ambulatory Visit (INDEPENDENT_AMBULATORY_CARE_PROVIDER_SITE_OTHER): Payer: Medicare Other | Admitting: Gastroenterology

## 2017-09-29 VITALS — BP 126/80 | HR 104

## 2017-09-29 DIAGNOSIS — E43 Unspecified severe protein-calorie malnutrition: Secondary | ICD-10-CM | POA: Diagnosis not present

## 2017-09-29 DIAGNOSIS — C159 Malignant neoplasm of esophagus, unspecified: Secondary | ICD-10-CM

## 2017-09-29 DIAGNOSIS — R1310 Dysphagia, unspecified: Secondary | ICD-10-CM

## 2017-09-29 NOTE — H&P (View-Only) (Signed)
George Stokes    [id number]    1946-01-09  Primary Care Physician:Patient, No Pcp Per  Referring Physician: No referring provider defined for this encounter.  Chief complaint: Dysphagia, esophageal cancer  HPI: 72 year old male with history of chronic COPD on continuous oxygen, was diagnosed with esophageal cancer January 2019, noted severe distal esophageal stricture with EG junction mass lesion and gastro-esophageal junction biopsy suspicious for adenocarcinoma.  Is receiving chemotherapy and radiation therapy, completed in March 2019.  He has jejunostomy tube, receiving bolus tube feeds.  Esophagram September 05, 2017 showed complete obstruction. Patient says he is able to swallow saliva and liquids without any difficulty.  He has not taken any solid food by mouth, states he was not given any at [facility]. He continues to have difficulty maintaining his weight, has lost 2 pounds last week according to patient   Outpatient Encounter Medications as of 09/29/2017  Medication Sig  . acetaminophen (TYLENOL) 325 MG tablet Take 2 tablets (650 mg total) by mouth every 6 (six) hours as needed for mild pain (or Fever >/= 101).  Marland Kitchen albuterol (PROVENTIL HFA;VENTOLIN HFA) 108 (90 Base) MCG/ACT inhaler Inhale 2 puffs into the lungs every 6 (six) hours as needed for wheezing or shortness of breath.  . Amino Acids-Protein Hydrolys (FEEDING SUPPLEMENT, PRO-STAT SUGAR FREE 64,) LIQD Take 30 mLs by mouth 2 (two) times daily with a meal.  . clotrimazole-betamethasone (LOTRISONE) cream Apply 1 application topically 2 (two) times daily.  . heparin lock flush 100 UNIT/ML SOLN injection 2.5 mLs (250 Units total) by Intracatheter route once as needed ([place] or PICC flush).  . indomethacin (INDOCIN) 25 MG capsule Take 25 mg by mouth daily as needed for mild pain.  Marland Kitchen metoCLOPramide (REGLAN) 10 MG/10ML SOLN Place 5 mLs (5 mg total) into feeding tube every 8 (eight) hours.  . Multiple Vitamin  (MULTIVITAMIN) LIQD Place 15 mLs into feeding tube daily.  . Nutritional Supplements (FEEDING SUPPLEMENT, OSMOLITE 1.5 CAL,) LIQD 60 mL an hour via PEG tube continuously--with water flushes 50 mL every 4 hours  . ondansetron (ZOFRAN) 8 MG tablet Take 1 tablet (8 mg total) by mouth every 8 (eight) hours.  . Oxycodone HCl 10 MG TABS Take 1 tablet (10 mg total) by mouth every 4 (four) hours as needed.  . sennosides (SENOKOT) 8.8 MG/5ML syrup Take 5 mLs by mouth at bedtime.  . SYMBICORT 160-4.5 MCG/ACT inhaler Inhale 2 puffs into the lungs 2 (two) times daily.  Marland Kitchen zolpidem (AMBIEN) 5 MG tablet Take 1 tablet daily at bedtime as needed   No facility-administered encounter medications on file as of 09/29/2017.     Allergies as of 09/29/2017  . (No Known Allergies)    Past Medical History:  Diagnosis Date  . COPD (chronic obstructive pulmonary disease) (HCC)   . Dyspnea   . esophageal ca dx'd 07/13/17   mass at GE junction  . Esophageal mass   . Gout   . Heart murmur    during teenage years   . Hypertension   . On home oxygen therapy    1-2 L/M   . Pneumonia     Past Surgical History:  Procedure Laterality Date  . ESOPHAGOGASTRODUODENOSCOPY (EGD) WITH PROPOFOL N/A 07/13/2017   Procedure: ESOPHAGOGASTRODUODENOSCOPY (EGD) WITH PROPOFOL;  Surgeon: Napoleon Form, MD;  Location: [facility] END[facility]COPY;  Service: Endoscopy;  Laterality: N/A;  . IR REPLC DUODEN/JEJUNO TUBE PERCUT W/FLUORO  08/16/2017  . IR REPLC DUODEN/JEJUNO TUBE  PERCUT W/FLUORO  09/09/2017  . LAPAROTOMY N/A 07/15/2017   Procedure: FEEDING JEJUNOSTOMY TUBE PLACEMENT;  Surgeon: Claud Kelp, MD;  Location: [facility] ORS;  Service: General;  Laterality: N/A;  . LAPAROTOMY N/A 07/20/2017   Procedure: EXPLORATORY LAPAROTOMY; REVISION OF JEJUNOSTOMY;  Surgeon: Glenna Fellows, MD;  Location: [facility] ORS;  Service: General;  Laterality: N/A;  . TONSILLECTOMY      Family History  Problem Relation Age of Onset  . Dementia Mother   . Cancer  Father   . Gout Father   . Thyroid disease Sister   . Kidney disease Brother   . Gout Brother   . Diabetes Maternal Grandmother   . Stroke Maternal Grandfather     Social History   Socioeconomic History  . Marital status: Single    Spouse name: Not on file  . Number of children: Not on file  . Years of education: Not on file  . Highest education level: Not on file  Occupational History  . Occupation: retired  Engineer, production  . Financial resource strain: Not on file  . Food insecurity:    Worry: Not on file    Inability: Not on file  . Transportation needs:    Medical: Not on file    Non-medical: Not on file  Tobacco Use  . Smoking status: Former Smoker    Packs/day: 2.00    Years: 43.00    Pack years: 86.00    Types: Cigarettes    Last attempt to quit: 05/03/2009    Years since quitting: 8.4  . Smokeless tobacco: Never Used  Substance and Sexual Activity  . Alcohol use: Yes    Alcohol/week: 0.0 oz    Comment: vodka daily;1-2 drinks in last 4 weeks   . Drug use: No  . Sexual activity: Never  Lifestyle  . Physical activity:    Days per week: Not on file    Minutes per session: Not on file  . Stress: Not on file  Relationships  . Social connections:    Talks on phone: Not on file    Gets together: Not on file    Attends religious service: Not on file    Active member of club or organization: Not on file    Attends meetings of clubs or organizations: Not on file    Relationship status: Not on file  . Intimate partner violence:    Fear of current or ex partner: Not on file    Emotionally abused: Not on file    Physically abused: Not on file    Forced sexual activity: Not on file  Other Topics Concern  . Not on file  Social History Narrative  . Not on file      Review of systems: Review of Systems  Constitutional: Negative for fever and chills.  HENT: Negative.   Eyes: Negative for blurred vision.  Respiratory: Negative for cough, shortness of breath  and wheezing.   Cardiovascular: Negative for chest pain and palpitations.  Gastrointestinal: as per HPI Genitourinary: Negative for dysuria, urgency, frequency and hematuria.  Musculoskeletal: Negative for myalgias, back pain and joint pain.  Skin: Negative for itching and rash.  Neurological: Negative for dizziness, tremors, focal weakness, seizures and loss of consciousness.  Endo/Heme/Allergies: Positive for seasonal allergies.  Psychiatric/Behavioral: Negative for depression, suicidal ideas and hallucinations.  All other systems reviewed and are negative.   Physical Exam: Vitals:   09/29/17 1111  BP: 126/80  Pulse: (!) 104   There is no height or  weight on file to calculate BMI. Gen:      No acute distress, wheelchair-bound on oxygen HEENT:  EOMI, sclera anicteric Neck:     No masses; no thyromegaly Lungs:    Decreased breath sounds in bilateral basal crackles; normal respiratory effort CV:         Regular rate and rhythm; no murmurs Abd:      + bowel sounds; soft, non-tender; no palpable masses, no distension, jejunostomy tube Ext:    No edema; adequate peripheral perfusion Skin:      Warm and dry; no rash Neuro: alert and oriented x 3 Psych: normal mood and affect  Data Reviewed:  Reviewed labs, radiology imaging, old records and pertinent past GI work up   Assessment and Plan/Recommendations:  72 year old male with severe COPD on continuous oxygen, severe protein calorie malnutrition, distal esophageal cancer status post chemo radiation with findings of complete distal esophageal obstruction on esophagram September 05, 2017.  Patient is able to handle secretions and is swallowing liquids without any difficulty .  PET scan showed distal esophageal hypermetabolic mass consistent with neoplasm, no distinct metastatic lesions  We will schedule for EGD at Hospital endoscopy unit to evaluate   The risks and benefits as well as alternatives of endoscopic procedure(s) have been  discussed and reviewed. All questions answered. The patient agrees to proceed.   25 minutes was spent face-to-face with the patient. Greater than 50% of the time used for counseling as well as treatment plan and follow-up. She had multiple questions which were answered to her satisfaction  K. Scherry Ran , MD [phone]    CC: No ref. provider found

## 2017-09-29 NOTE — Patient Instructions (Signed)
Normal BMI (Body Mass Index- based on height and weight) is between 23 and 30. Your BMI today is There is no height or weight on file to calculate BMI. Marland Kitchen Please consider follow up  regarding your BMI with your Primary Care Provider  You have been scheduled for an endoscopy. Please follow written instructions given to you at your visit today. If you use inhalers (even only as needed), please bring them with you on the day of your procedure. Your physician has requested that you go to www.startemmi.com and enter the access code given to you at your visit today. This web site gives a general overview about your procedure. However, you should still follow specific instructions given to you by our office regarding your preparation for the procedure.

## 2017-09-29 NOTE — Progress Notes (Signed)
George Stokes    [id number]    1946-01-09  Primary Care Physician:Patient, No Pcp Per  Referring Physician: No referring provider defined for this encounter.  Chief complaint: Dysphagia, esophageal cancer  HPI: 72 year old male with history of chronic COPD on continuous oxygen, was diagnosed with esophageal cancer January 2019, noted severe distal esophageal stricture with EG junction mass lesion and gastro-esophageal junction biopsy suspicious for adenocarcinoma.  Is receiving chemotherapy and radiation therapy, completed in March 2019.  He has jejunostomy tube, receiving bolus tube feeds.  Esophagram September 05, 2017 showed complete obstruction. Patient says he is able to swallow saliva and liquids without any difficulty.  He has not taken any solid food by mouth, states he was not given any at [facility]. He continues to have difficulty maintaining his weight, has lost 2 pounds last week according to patient   Outpatient Encounter Medications as of 09/29/2017  Medication Sig  . acetaminophen (TYLENOL) 325 MG tablet Take 2 tablets (650 mg total) by mouth every 6 (six) hours as needed for mild pain (or Fever >/= 101).  Marland Kitchen albuterol (PROVENTIL HFA;VENTOLIN HFA) 108 (90 Base) MCG/ACT inhaler Inhale 2 puffs into the lungs every 6 (six) hours as needed for wheezing or shortness of breath.  . Amino Acids-Protein Hydrolys (FEEDING SUPPLEMENT, PRO-STAT SUGAR FREE 64,) LIQD Take 30 mLs by mouth 2 (two) times daily with a meal.  . clotrimazole-betamethasone (LOTRISONE) cream Apply 1 application topically 2 (two) times daily.  . heparin lock flush 100 UNIT/ML SOLN injection 2.5 mLs (250 Units total) by Intracatheter route once as needed (Hickman or PICC flush).  . indomethacin (INDOCIN) 25 MG capsule Take 25 mg by mouth daily as needed for mild pain.  Marland Kitchen metoCLOPramide (REGLAN) 10 MG/10ML SOLN Place 5 mLs (5 mg total) into feeding tube every 8 (eight) hours.  . Multiple Vitamin  (MULTIVITAMIN) LIQD Place 15 mLs into feeding tube daily.  . Nutritional Supplements (FEEDING SUPPLEMENT, OSMOLITE 1.5 CAL,) LIQD 60 mL an hour via PEG tube continuously--with water flushes 50 mL every 4 hours  . ondansetron (ZOFRAN) 8 MG tablet Take 1 tablet (8 mg total) by mouth every 8 (eight) hours.  . Oxycodone HCl 10 MG TABS Take 1 tablet (10 mg total) by mouth every 4 (four) hours as needed.  . sennosides (SENOKOT) 8.8 MG/5ML syrup Take 5 mLs by mouth at bedtime.  . SYMBICORT 160-4.5 MCG/ACT inhaler Inhale 2 puffs into the lungs 2 (two) times daily.  Marland Kitchen zolpidem (AMBIEN) 5 MG tablet Take 1 tablet daily at bedtime as needed   No facility-administered encounter medications on file as of 09/29/2017.     Allergies as of 09/29/2017  . (No Known Allergies)    Past Medical History:  Diagnosis Date  . COPD (chronic obstructive pulmonary disease) (HCC)   . Dyspnea   . esophageal ca dx'd 07/13/17   mass at GE junction  . Esophageal mass   . Gout   . Heart murmur    during teenage years   . Hypertension   . On home oxygen therapy    1-2 L/M   . Pneumonia     Past Surgical History:  Procedure Laterality Date  . ESOPHAGOGASTRODUODENOSCOPY (EGD) WITH PROPOFOL N/A 07/13/2017   Procedure: ESOPHAGOGASTRODUODENOSCOPY (EGD) WITH PROPOFOL;  Surgeon: Napoleon Form, MD;  Location: [facility];  Service: Endoscopy;  Laterality: N/A;  . IR REPLC DUODEN/JEJUNO TUBE PERCUT W/FLUORO  08/16/2017  . IR REPLC DUODEN/JEJUNO TUBE  PERCUT W/FLUORO  09/09/2017  . LAPAROTOMY N/A 07/15/2017   Procedure: FEEDING JEJUNOSTOMY TUBE PLACEMENT;  Surgeon: Claud Kelp, MD;  Location: [facility] ORS;  Service: General;  Laterality: N/A;  . LAPAROTOMY N/A 07/20/2017   Procedure: EXPLORATORY LAPAROTOMY; REVISION OF JEJUNOSTOMY;  Surgeon: Glenna Fellows, MD;  Location: [facility] ORS;  Service: General;  Laterality: N/A;  . TONSILLECTOMY      Family History  Problem Relation Age of Onset  . Dementia Mother   . Cancer  Father   . Gout Father   . Thyroid disease Sister   . Kidney disease Brother   . Gout Brother   . Diabetes Maternal Grandmother   . Stroke Maternal Grandfather     Social History   Socioeconomic History  . Marital status: Single    Spouse name: Not on file  . Number of children: Not on file  . Years of education: Not on file  . Highest education level: Not on file  Occupational History  . Occupation: retired  Engineer, production  . Financial resource strain: Not on file  . Food insecurity:    Worry: Not on file    Inability: Not on file  . Transportation needs:    Medical: Not on file    Non-medical: Not on file  Tobacco Use  . Smoking status: Former Smoker    Packs/day: 2.00    Years: 43.00    Pack years: 86.00    Types: Cigarettes    Last attempt to quit: 05/03/2009    Years since quitting: 8.4  . Smokeless tobacco: Never Used  Substance and Sexual Activity  . Alcohol use: Yes    Alcohol/week: 0.0 oz    Comment: vodka daily;1-2 drinks in last 4 weeks   . Drug use: No  . Sexual activity: Never  Lifestyle  . Physical activity:    Days per week: Not on file    Minutes per session: Not on file  . Stress: Not on file  Relationships  . Social connections:    Talks on phone: Not on file    Gets together: Not on file    Attends religious service: Not on file    Active member of club or organization: Not on file    Attends meetings of clubs or organizations: Not on file    Relationship status: Not on file  . Intimate partner violence:    Fear of current or ex partner: Not on file    Emotionally abused: Not on file    Physically abused: Not on file    Forced sexual activity: Not on file  Other Topics Concern  . Not on file  Social History Narrative  . Not on file      Review of systems: Review of Systems  Constitutional: Negative for fever and chills.  HENT: Negative.   Eyes: Negative for blurred vision.  Respiratory: Negative for cough, shortness of breath  and wheezing.   Cardiovascular: Negative for chest pain and palpitations.  Gastrointestinal: as per HPI Genitourinary: Negative for dysuria, urgency, frequency and hematuria.  Musculoskeletal: Negative for myalgias, back pain and joint pain.  Skin: Negative for itching and rash.  Neurological: Negative for dizziness, tremors, focal weakness, seizures and loss of consciousness.  Endo/Heme/Allergies: Positive for seasonal allergies.  Psychiatric/Behavioral: Negative for depression, suicidal ideas and hallucinations.  All other systems reviewed and are negative.   Physical Exam: Vitals:   09/29/17 1111  BP: 126/80  Pulse: (!) 104   There is no height or  weight on file to calculate BMI. Gen:      No acute distress, wheelchair-bound on oxygen HEENT:  EOMI, sclera anicteric Neck:     No masses; no thyromegaly Lungs:    Decreased breath sounds in bilateral basal crackles; normal respiratory effort CV:         Regular rate and rhythm; no murmurs Abd:      + bowel sounds; soft, non-tender; no palpable masses, no distension, jejunostomy tube Ext:    No edema; adequate peripheral perfusion Skin:      Warm and dry; no rash Neuro: alert and oriented x 3 Psych: normal mood and affect  Data Reviewed:  Reviewed labs, radiology imaging, old records and pertinent past GI work up   Assessment and Plan/Recommendations:  72 year old male with severe COPD on continuous oxygen, severe protein calorie malnutrition, distal esophageal cancer status post chemo radiation with findings of complete distal esophageal obstruction on esophagram September 05, 2017.  Patient is able to handle secretions and is swallowing liquids without any difficulty .  PET scan showed distal esophageal hypermetabolic mass consistent with neoplasm, no distinct metastatic lesions  We will schedule for EGD at Hospital endoscopy unit to evaluate   The risks and benefits as well as alternatives of endoscopic procedure(s) have been  discussed and reviewed. All questions answered. The patient agrees to proceed.   25 minutes was spent face-to-face with the patient. Greater than 50% of the time used for counseling as well as treatment plan and follow-up. She had multiple questions which were answered to her satisfaction  K. Scherry Ran , MD [phone]    CC: No ref. provider found

## 2017-09-30 DIAGNOSIS — K222 Esophageal obstruction: Secondary | ICD-10-CM | POA: Diagnosis not present

## 2017-09-30 DIAGNOSIS — C159 Malignant neoplasm of esophagus, unspecified: Secondary | ICD-10-CM | POA: Diagnosis not present

## 2017-09-30 DIAGNOSIS — J449 Chronic obstructive pulmonary disease, unspecified: Secondary | ICD-10-CM | POA: Diagnosis not present

## 2017-09-30 DIAGNOSIS — R1319 Other dysphagia: Secondary | ICD-10-CM | POA: Diagnosis not present

## 2017-10-01 ENCOUNTER — Encounter: Payer: Self-pay | Admitting: Gastroenterology

## 2017-10-06 ENCOUNTER — Other Ambulatory Visit: Payer: Self-pay

## 2017-10-06 DIAGNOSIS — L89150 Pressure ulcer of sacral region, unstageable: Secondary | ICD-10-CM | POA: Diagnosis not present

## 2017-10-06 NOTE — Progress Notes (Addendum)
Preop instructions for:    Maycen Degregory                     Date of Birth  : [birth year]                          Date of Procedure:  10-10-17     Doctor:  Harl Bowie Time to arrive at [facility]: Report to: Admitting  @1015  Am  Procedure: EGD Any procedure time changes, MD office will notify you!  Do not eat or drink past midnight the night before your procedure.(To include any tube feedings-must be discontinued)  Reminder:Follow bowel prep instructions per MD office!  Take these morning medications only with sips of water.(or give through gastrostomy or feeding tube).Inhalers and bring,oxycodone if needed, prednisone, metoclopramide, zofran if needed phenergan if needed.  Note: No Insulin or Diabetic meds should be given or taken the morning of the procedure!  Facility contact: [facility]                 Phone:  [place]: no  Transportation contact phone#: Lennette Bihari in transport [phone]  Please send day of procedure:current med list and meds last taken that day, confirm nothing by mouth status from what time, Patient Demographic info( to include DNR status, problem list, allergies)  RN contact name/phone#: [place] [phone]                          and Fax #:  [phone]  Bring Insurance card and picture ID Leave all jewelry and other valuables at place where living( no metal or rings to be worn) No contact lens Women-no make-up, no lotions,perfumes,powders Men-no colognes,lotions  Any questions day of procedure,call Endoscopy unit-[phone]!  Sent from :[facility]                   [place]                   [phone]  Sent by :  Leo Grosser

## 2017-10-06 NOTE — Progress Notes (Signed)
George Stokes at [facility] gave pt. Instructions to unit supervisor for pt. Procedure.

## 2017-10-07 DIAGNOSIS — R1310 Dysphagia, unspecified: Secondary | ICD-10-CM | POA: Diagnosis not present

## 2017-10-07 DIAGNOSIS — C159 Malignant neoplasm of esophagus, unspecified: Secondary | ICD-10-CM | POA: Diagnosis not present

## 2017-10-07 DIAGNOSIS — J9611 Chronic respiratory failure with hypoxia: Secondary | ICD-10-CM | POA: Diagnosis not present

## 2017-10-07 DIAGNOSIS — E46 Unspecified protein-calorie malnutrition: Secondary | ICD-10-CM | POA: Diagnosis not present

## 2017-10-07 DIAGNOSIS — J449 Chronic obstructive pulmonary disease, unspecified: Secondary | ICD-10-CM | POA: Diagnosis not present

## 2017-10-08 ENCOUNTER — Encounter (HOSPITAL_COMMUNITY): Payer: Self-pay | Admitting: Emergency Medicine

## 2017-10-08 ENCOUNTER — Other Ambulatory Visit: Payer: Self-pay

## 2017-10-08 ENCOUNTER — Emergency Department (HOSPITAL_COMMUNITY)
Admission: EM | Admit: 2017-10-08 | Discharge: 2017-10-09 | Disposition: A | Discharge status: Home / Self Care | Payer: Medicare Other | Attending: Emergency Medicine | Admitting: Emergency Medicine

## 2017-10-08 DIAGNOSIS — Z8501 Personal history of malignant neoplasm of esophagus: Secondary | ICD-10-CM | POA: Insufficient documentation

## 2017-10-08 DIAGNOSIS — I10 Essential (primary) hypertension: Secondary | ICD-10-CM | POA: Insufficient documentation

## 2017-10-08 DIAGNOSIS — J449 Chronic obstructive pulmonary disease, unspecified: Secondary | ICD-10-CM | POA: Insufficient documentation

## 2017-10-08 DIAGNOSIS — Z79899 Other long term (current) drug therapy: Secondary | ICD-10-CM | POA: Insufficient documentation

## 2017-10-08 DIAGNOSIS — Z87891 Personal history of nicotine dependence: Secondary | ICD-10-CM | POA: Insufficient documentation

## 2017-10-08 DIAGNOSIS — R1110 Vomiting, unspecified: Secondary | ICD-10-CM | POA: Diagnosis not present

## 2017-10-08 DIAGNOSIS — Z434 Encounter for attention to other artificial openings of digestive tract: Secondary | ICD-10-CM | POA: Insufficient documentation

## 2017-10-08 DIAGNOSIS — K9413 Enterostomy malfunction: Secondary | ICD-10-CM | POA: Diagnosis not present

## 2017-10-08 DIAGNOSIS — K9423 Gastrostomy malfunction: Secondary | ICD-10-CM | POA: Diagnosis not present

## 2017-10-08 DIAGNOSIS — T85528A Displacement of other gastrointestinal prosthetic devices, implants and grafts, initial encounter: Secondary | ICD-10-CM

## 2017-10-08 NOTE — ED Triage Notes (Signed)
Patient is here because nursing staff says he pulled out his J tube. Staff sent j tube with patient because a piece of it was missing.

## 2017-10-09 ENCOUNTER — Encounter (HOSPITAL_COMMUNITY): Payer: Self-pay | Admitting: Diagnostic Radiology

## 2017-10-09 ENCOUNTER — Emergency Department (HOSPITAL_COMMUNITY): Payer: Medicare Other

## 2017-10-09 HISTORY — PX: IR REPLC DUODEN/JEJUNO TUBE PERCUT W/FLUORO: IMG2334

## 2017-10-09 MED ORDER — IOPAMIDOL (ISOVUE-300) INJECTION 61%
INTRAVENOUS | Status: AC
  Administered 2017-10-09: 5 mL
  Filled 2017-10-09: qty 50

## 2017-10-09 MED ORDER — IOPAMIDOL (ISOVUE-300) INJECTION 61%
50.0000 mL | Freq: Once | INTRAVENOUS | Status: AC | PRN
  Administered 2017-10-09: 5 mL

## 2017-10-09 MED ORDER — LIDOCAINE VISCOUS 2 % MT SOLN
OROMUCOSAL | Status: AC
  Filled 2017-10-09: qty 15

## 2017-10-09 NOTE — Discharge Instructions (Addendum)
Your jejunostomy tube was replaced.  Follow-up with GI as scheduled.

## 2017-10-09 NOTE — ED Provider Notes (Signed)
[facility] DEPT Provider Note   CSN: [id number] Arrival date & time: 10/08/17  2305     History   Chief Complaint Chief Complaint  Patient presents with  . Needs j tube replaced (16 french)    HPI George Stokes is a 72 y.o. male.  HPI  This is a 72 year old male with a history of esophageal cancer, hypertension, COPD who presents with dislodged J-tube.  Patient reports that 2 hours prior to arrival he noted that his J-tube had become dislodged.  He denies any pain at the site.  This is have an once previously requiring admission and replacement by IR.  He denies any chest pain, shortness of breath, abdominal pain.  He does report daily emesis.  This is not new.  Past Medical History:  Diagnosis Date  . COPD (chronic obstructive pulmonary disease) ([place])   . Dyspnea   . esophageal ca dx'd 07/13/17   mass at GE junction  . Esophageal mass   . Gout   . Heart murmur    during teenage years   . Hypertension   . On home oxygen therapy    1-2 L/M   . Pneumonia     Patient Active Problem List   Diagnosis Date Noted  . Palliative care by specialist   . Goals of care, counseling/discussion   . Dehydration 09/10/2017  . Anemia 09/09/2017  . Malfunction of gastrostomy tube ([place]) 09/08/2017  . Pressure injury of skin 08/01/2017  . Atelectasis of right lung   . H/O exploratory laparotomy 07/20/2017  . Acute respiratory failure ([place]) 07/20/2017  . Protein-calorie malnutrition ([place]) 07/16/2017  . Malignant neoplasm of lower third of esophagus ([place])   . Esophageal dysphagia   . Esophageal stenosis   . Esophageal stricture   . Protein-calorie malnutrition, severe ([place]) 07/12/2017  . Hypomagnesemia   . Hypokalemia   . Lobar pneumonia ([place])   . Intractable nausea and vomiting 07/08/2017  . CAP (community acquired pneumonia) 07/08/2017  . Acute kidney injury ([place])   . Intractable vomiting with nausea 06/29/2017  . Sleep disturbance 01/04/2017    . Idiopathic chronic gout of multiple sites without tophus 01/04/2017  . Chronic respiratory failure with hypoxia ([place]) 01/13/2016  . COPD with hypoxia ([place]) 05/03/2014  . Gout 10/30/2011  . Essential hypertension 10/30/2011  . Dyslipidemia 10/30/2011    Past Surgical History:  Procedure Laterality Date  . ESOPHAGOGASTRODUODENOSCOPY (EGD) WITH PROPOFOL N/A 07/13/2017   Procedure: ESOPHAGOGASTRODUODENOSCOPY (EGD) WITH PROPOFOL;  Surgeon: Mauri Pole, MD;  Location: [facility];  Service: Endoscopy;  Laterality: N/A;  . IR REPLC DUODEN/JEJUNO TUBE PERCUT W/FLUORO  08/16/2017  . IR REPLC DUODEN/JEJUNO TUBE PERCUT W/FLUORO  09/09/2017  . LAPAROTOMY N/A 07/15/2017   Procedure: FEEDING JEJUNOSTOMY TUBE PLACEMENT;  Surgeon: Fanny Skates, MD;  Location: [facility];  Service: General;  Laterality: N/A;  . LAPAROTOMY N/A 07/20/2017   Procedure: EXPLORATORY LAPAROTOMY; REVISION OF JEJUNOSTOMY;  Surgeon: Excell Seltzer, MD;  Location: [facility];  Service: General;  Laterality: N/A;  . TONSILLECTOMY          Home Medications    Prior to Admission medications   Medication Sig Start Date End Date Taking? Authorizing Provider  acetaminophen (TYLENOL) 325 MG tablet Take 2 tablets (650 mg total) by mouth every 6 (six) hours as needed for mild pain (or Fever >/= 101). 09/14/17   Bonnielee Haff, MD  albuterol (PROVENTIL HFA;VENTOLIN HFA) 108 (90 Base) MCG/ACT inhaler Inhale 2 puffs into the lungs  every 6 (six) hours as needed for wheezing or shortness of breath. 08/02/17   Roxan Hockey, MD  Amino Acids-Protein Hydrolys (FEEDING SUPPLEMENT, PRO-STAT SUGAR FREE 64,) LIQD Take 30 mLs by mouth 2 (two) times daily with a meal. 09/14/17   Bonnielee Haff, MD  clotrimazole-betamethasone (LOTRISONE) cream Apply 1 application topically 2 (two) times daily. 01/04/17   Horald Pollen, MD  heparin lock flush 100 UNIT/ML SOLN injection 2.5 mLs (250 Units total) by Intracatheter route once as needed  (Hickman or PICC flush). 08/02/17   Roxan Hockey, MD  indomethacin (INDOCIN) 25 MG capsule Take 25 mg by mouth daily as needed for mild pain.    [provider]  metoCLOPramide (REGLAN) 10 MG/10ML SOLN Place 5 mLs (5 mg total) into feeding tube every 8 (eight) hours. 09/14/17   Bonnielee Haff, MD  Multiple Vitamin (MULTIVITAMIN) LIQD Place 15 mLs into feeding tube daily. 09/14/17   Bonnielee Haff, MD  Nutritional Supplements (FEEDING SUPPLEMENT, OSMOLITE 1.5 CAL,) LIQD 60 mL an hour via PEG tube continuously--with water flushes 50 mL every 4 hours 08/02/17   Emokpae, Courage, MD  ondansetron (ZOFRAN) 8 MG tablet Take 1 tablet (8 mg total) by mouth every 8 (eight) hours. 09/08/17   Hayden Pedro, PA-C  Oxycodone HCl 10 MG TABS Take 1 tablet (10 mg total) by mouth every 4 (four) hours as needed. 09/14/17   Bonnielee Haff, MD  sennosides (SENOKOT) 8.8 MG/5ML syrup Take 5 mLs by mouth at bedtime. 09/14/17   Bonnielee Haff, MD  SYMBICORT 160-4.5 MCG/ACT inhaler Inhale 2 puffs into the lungs 2 (two) times daily. 01/24/17   Tanda Rockers, MD  zolpidem (AMBIEN) 5 MG tablet Take 1 tablet daily at bedtime as needed 05/01/15   Glenford Bayley, DO    Family History Family History  Problem Relation Age of Onset  . Dementia Mother   . Cancer Father   . Gout Father   . Thyroid disease Sister   . Kidney disease Brother   . Gout Brother   . Diabetes Maternal Grandmother   . Stroke Maternal Grandfather     Social History Social History   Tobacco Use  . Smoking status: Former Smoker    Packs/day: 2.00    Years: 43.00    Pack years: 86.00    Types: Cigarettes    Last attempt to quit: 05/03/2009    Years since quitting: 8.4  . Smokeless tobacco: Never Used  Substance Use Topics  . Alcohol use: Yes    Alcohol/week: 0.0 oz    Comment: vodka daily;1-2 drinks in last 4 weeks   . Drug use: No     Allergies   Patient has no known allergies.   Review of Systems Review of Systems    Constitutional: Negative for fever.  Respiratory: Negative for shortness of breath.   Cardiovascular: Negative for chest pain.  Gastrointestinal: Positive for vomiting. Negative for abdominal pain.  Genitourinary: Negative for dysuria.  All other systems reviewed and are negative.    Physical Exam Updated Vital Signs BP 134/82 (BP Location: Left Arm)   Pulse 88   Temp 98.5 F (36.9 C) (Oral)   Resp 13   Ht 5\' 7"  (1.702 m)   Wt 52.2 kg (115 lb)   SpO2 94%   BMI 18.01 kg/m   Physical Exam  Constitutional: He is oriented to person, place, and time.  Chronically ill-appearing, no acute distress, cachectic appearing  HENT:  Head: Normocephalic and atraumatic.  Cardiovascular:  Normal rate, regular rhythm and normal heart sounds.  No murmur heard. Pulmonary/Chest: Effort normal and breath sounds normal. No respiratory distress. He has no wheezes.  Abdominal: Soft. Bowel sounds are normal. There is no tenderness. There is no rebound.  Small J-tube stoma without discharge or significant erythema  Neurological: He is alert and oriented to person, place, and time.  Skin: Skin is warm and dry.  Psychiatric: He has a normal mood and affect.  Nursing note and vitals reviewed.    ED Treatments / Results  Labs (all labs ordered are listed, but only abnormal results are displayed) Labs Reviewed - No data to display  EKG None  Radiology No results found.  Procedures Procedures (including critical care time)  Medications Ordered in ED Medications  lidocaine (XYLOCAINE) 2 % viscous mouth solution (has no administration in time range)  iopamidol (ISOVUE-300) 61 % injection 50 mL (5 mLs Other Contrast Given 10/09/17 0111)     Initial Impression / Assessment and Plan / ED Course  I have reviewed the triage vital signs and the nursing notes.  Pertinent labs & imaging results that were available during my care of the patient were reviewed by me and considered in my medical  decision making (see chart for details).     Presents with dislodged jejunostomy tube.  He is otherwise asymptomatic.  Required IR replacement.  His belly is soft and nontender.  Vital signs are reassuring.  I discussed the patient with Dr. Markus Daft, IR to arrange for placement during the day tomorrow.  However, IR team is at the hospital at this time and Dr. Edwin Dada has agreed to come in place tonight.  Appreciate his consult.  Jejunostomy tube was placed without incident per documentation.  Patient will be discharged home in good condition.  After history, exam, and medical workup I feel the patient has been appropriately medically screened and is safe for discharge home. Pertinent diagnoses were discussed with the patient. Patient was given return precautions.   Final Clinical Impressions(s) / ED Diagnoses   Final diagnoses:  Dislodged jejunostomy tube    ED Discharge Orders    None       Dina Rich, Barbette Hair, MD 10/09/17 [phone]

## 2017-10-09 NOTE — Procedures (Signed)
Successful replacement of jejunal feeding tube.  16 Fr balloon retention tube is present and ready to use.  No blood loss and no immediate complication.

## 2017-10-10 ENCOUNTER — Other Ambulatory Visit: Payer: Self-pay

## 2017-10-10 ENCOUNTER — Encounter (HOSPITAL_COMMUNITY): Payer: Self-pay | Admitting: *Deleted

## 2017-10-10 ENCOUNTER — Ambulatory Visit (HOSPITAL_COMMUNITY)
Admission: RE | Admit: 2017-10-10 | Discharge: 2017-10-10 | Disposition: A | Discharge status: Skilled Nursing Facility | Payer: Medicare Other | Source: Ambulatory Visit | Attending: Gastroenterology | Admitting: Gastroenterology

## 2017-10-10 ENCOUNTER — Ambulatory Visit (HOSPITAL_COMMUNITY): Payer: Medicare Other | Admitting: Anesthesiology

## 2017-10-10 ENCOUNTER — Encounter (HOSPITAL_COMMUNITY)
Admission: RE | Disposition: A | Discharge status: Skilled Nursing Facility | Payer: Self-pay | Source: Ambulatory Visit | Attending: Gastroenterology

## 2017-10-10 ENCOUNTER — Ambulatory Visit (HOSPITAL_COMMUNITY): Payer: Medicare Other

## 2017-10-10 DIAGNOSIS — K208 Other esophagitis: Secondary | ICD-10-CM | POA: Insufficient documentation

## 2017-10-10 DIAGNOSIS — Z79899 Other long term (current) drug therapy: Secondary | ICD-10-CM | POA: Diagnosis not present

## 2017-10-10 DIAGNOSIS — K3189 Other diseases of stomach and duodenum: Secondary | ICD-10-CM | POA: Diagnosis not present

## 2017-10-10 DIAGNOSIS — K319 Disease of stomach and duodenum, unspecified: Secondary | ICD-10-CM | POA: Insufficient documentation

## 2017-10-10 DIAGNOSIS — T66XXXA Radiation sickness, unspecified, initial encounter: Secondary | ICD-10-CM | POA: Diagnosis not present

## 2017-10-10 DIAGNOSIS — J449 Chronic obstructive pulmonary disease, unspecified: Secondary | ICD-10-CM | POA: Insufficient documentation

## 2017-10-10 DIAGNOSIS — I10 Essential (primary) hypertension: Secondary | ICD-10-CM | POA: Insufficient documentation

## 2017-10-10 DIAGNOSIS — Z681 Body mass index (BMI) 19 or less, adult: Secondary | ICD-10-CM | POA: Insufficient documentation

## 2017-10-10 DIAGNOSIS — K2210 Ulcer of esophagus without bleeding: Secondary | ICD-10-CM

## 2017-10-10 DIAGNOSIS — R1310 Dysphagia, unspecified: Secondary | ICD-10-CM

## 2017-10-10 DIAGNOSIS — Z8501 Personal history of malignant neoplasm of esophagus: Secondary | ICD-10-CM | POA: Diagnosis not present

## 2017-10-10 DIAGNOSIS — C159 Malignant neoplasm of esophagus, unspecified: Secondary | ICD-10-CM

## 2017-10-10 DIAGNOSIS — Z9981 Dependence on supplemental oxygen: Secondary | ICD-10-CM | POA: Insufficient documentation

## 2017-10-10 DIAGNOSIS — K222 Esophageal obstruction: Secondary | ICD-10-CM | POA: Insufficient documentation

## 2017-10-10 DIAGNOSIS — T451X5A Adverse effect of antineoplastic and immunosuppressive drugs, initial encounter: Secondary | ICD-10-CM | POA: Insufficient documentation

## 2017-10-10 DIAGNOSIS — Z934 Other artificial openings of gastrointestinal tract status: Secondary | ICD-10-CM | POA: Diagnosis not present

## 2017-10-10 DIAGNOSIS — R1319 Other dysphagia: Secondary | ICD-10-CM

## 2017-10-10 DIAGNOSIS — Y842 Radiological procedure and radiotherapy as the cause of abnormal reaction of the patient, or of later complication, without mention of misadventure at the time of the procedure: Secondary | ICD-10-CM | POA: Insufficient documentation

## 2017-10-10 DIAGNOSIS — K228 Other specified diseases of esophagus: Secondary | ICD-10-CM | POA: Diagnosis not present

## 2017-10-10 DIAGNOSIS — E43 Unspecified severe protein-calorie malnutrition: Secondary | ICD-10-CM | POA: Diagnosis not present

## 2017-10-10 DIAGNOSIS — Z79891 Long term (current) use of opiate analgesic: Secondary | ICD-10-CM | POA: Insufficient documentation

## 2017-10-10 DIAGNOSIS — Z87891 Personal history of nicotine dependence: Secondary | ICD-10-CM | POA: Insufficient documentation

## 2017-10-10 HISTORY — PX: ESOPHAGOGASTRODUODENOSCOPY (EGD) WITH PROPOFOL: SHX5813

## 2017-10-10 SURGERY — ESOPHAGOGASTRODUODENOSCOPY (EGD) WITH PROPOFOL
Anesthesia: General

## 2017-10-10 MED ORDER — PROPOFOL 10 MG/ML IV BOLUS
INTRAVENOUS | Status: DC | PRN
  Administered 2017-10-10: 80 mg via INTRAVENOUS

## 2017-10-10 MED ORDER — OMEPRAZOLE 2 MG/ML ORAL SUSPENSION: 40.0000 mg | Freq: Two times a day (BID) | ORAL | 3 refills | Status: AC

## 2017-10-10 MED ORDER — SODIUM CHLORIDE 0.9 % IV SOLN: INTRAVENOUS | Status: DC

## 2017-10-10 MED ORDER — FENTANYL CITRATE (PF) 100 MCG/2ML IJ SOLN
INTRAMUSCULAR | Status: DC | PRN
  Administered 2017-10-10: 50 ug via INTRAVENOUS

## 2017-10-10 MED ORDER — FENTANYL CITRATE (PF) 100 MCG/2ML IJ SOLN
INTRAMUSCULAR | Status: AC
  Filled 2017-10-10: qty 2

## 2017-10-10 MED ORDER — LIDOCAINE 2% (20 MG/ML) 5 ML SYRINGE
INTRAMUSCULAR | Status: DC | PRN
  Administered 2017-10-10: 50 mg via INTRAVENOUS

## 2017-10-10 MED ORDER — LACTATED RINGERS IV SOLN
INTRAVENOUS | Status: DC | PRN
  Administered 2017-10-10: 11:00:00 via INTRAVENOUS

## 2017-10-10 MED ORDER — PROPOFOL 10 MG/ML IV BOLUS
INTRAVENOUS | Status: AC
  Filled 2017-10-10: qty 40

## 2017-10-10 MED ORDER — SUCCINYLCHOLINE CHLORIDE 200 MG/10ML IV SOSY
PREFILLED_SYRINGE | INTRAVENOUS | Status: DC | PRN
  Administered 2017-10-10: 100 mg via INTRAVENOUS

## 2017-10-10 MED ORDER — PHENYLEPHRINE 40 MCG/ML (10ML) SYRINGE FOR IV PUSH (FOR BLOOD PRESSURE SUPPORT)
PREFILLED_SYRINGE | INTRAVENOUS | Status: DC | PRN
  Administered 2017-10-10 (×2): 120 ug via INTRAVENOUS
  Administered 2017-10-10: 40 ug via INTRAVENOUS

## 2017-10-10 MED ORDER — SUCRALFATE 1 GM/10ML PO SUSP: 1.0000 g | Freq: Four times a day (QID) | ORAL | 1 refills | Status: AC

## 2017-10-10 SURGICAL SUPPLY — 15 items

## 2017-10-10 NOTE — Anesthesia Postprocedure Evaluation (Signed)
Anesthesia Post Note  Patient: OUSMAN DISE  Procedure(s) Performed: ESOPHAGOGASTRODUODENOSCOPY (EGD) WITH PROPOFOL (N/A )     Patient location during evaluation: Endoscopy Anesthesia Type: General Level of consciousness: awake and alert Pain management: pain level controlled Vital Signs Assessment: post-procedure vital signs reviewed and stable Respiratory status: spontaneous breathing, nonlabored ventilation, respiratory function stable and patient connected to nasal cannula oxygen Cardiovascular status: blood pressure returned to baseline and stable Postop Assessment: no apparent nausea or vomiting Anesthetic complications: no    Last Vitals:  Vitals:   10/10/17 1300 10/10/17 1311  BP: 133/78   Pulse: 78   Resp: 12   Temp:    SpO2: (!) 89% 94%    Last Pain:  Vitals:   10/10/17 1250  TempSrc:   PainSc: 0-No pain                 Charlton Boule COKER

## 2017-10-10 NOTE — Op Note (Addendum)
[facility] Patient Name: George Stokes Procedure Date: 10/10/2017 MRN: [id number] Attending MD: Napoleon Form , MD Date of Birth: 02-22-1946 CSN: [id number] Age: 72 Admit Type: Inpatient Procedure:                Upper GI endoscopy Indications:              Stenosis of the esophagus, For therapy of                            esophageal stenosis Providers:                Napoleon Form, MD, Roselie Awkward, RN, Margo Aye, Technician Referring MD:              Medicines:                Monitored Anesthesia Care Complications:            No immediate complications. Estimated Blood Loss:     Estimated blood loss was minimal. Procedure:                Pre-Anesthesia Assessment:                           - Prior to the procedure, a History and Physical                            was performed, and patient medications and                            allergies were reviewed. The patient's tolerance of                            previous anesthesia was also reviewed. The risks                            and benefits of the procedure and the sedation                            options and risks were discussed with the patient.                            All questions were answered, and informed consent                            was obtained. Prior Anticoagulants: The patient has                            taken no previous anticoagulant or antiplatelet                            agents. ASA Grade Assessment: III - A patient with  severe systemic disease. After reviewing the risks                            and benefits, the patient was deemed in                            satisfactory condition to undergo the procedure.                           After obtaining informed consent, the endoscope was                            passed under direct vision. Throughout the                            procedure, the  patient's blood pressure, pulse, and                            oxygen saturations were monitored continuously. The                            EG-[id number] ([id number]) scope was introduced through the                            mouth, and advanced to the second part of duodenum.                            The upper GI endoscopy was accomplished without                            difficulty. The patient tolerated the procedure                            well. Scope In: Scope Out: Findings:      One severe (stenosis; an endoscope cannot pass) stenosis was found 40 to       41 cm from the incisors. This stenosis measured 5 mm (inner diameter) x       1 cm (in length). The stenosis was traversed after downsizing scope to       pediatric endoscope Biopsies were taken with a cold forceps for       histology.      Severe esophagitis with bleeding was found 39 to 41 cm from the       incisors. Biopsies were taken with a cold forceps for histology.      Diffuse mildly congested mucosa was found at the gastroesophageal       junction. Biopsies were taken with a cold forceps for histology.      The cardia and gastric fundus were normal on retroflexion.      The examined duodenum was normal. Impression:               - Esophageal stenosis. Biopsied.                           - Severe chemotherapy/radiation esophagitis.  Biopsied. No visible mass lesion                           - Congestive gastropathy. Biopsied. No visible mass                            lesion                           - Normal examined duodenum. Moderate Sedation:      N/A- Per Anesthesia Care Recommendation:           - Clear liquid diet for 2 days.                           - Clear liquid diet today, then advance as                            tolerated to full liquid diet.                           - Continue present medications.                           - Follow an antireflux regimen indefinitely.                            - Use liquid omeprazole 40 mg PO BID.                           - Use sucralfate suspension 1 gram PO before meals                            and at bedtime for 2 months.                           - Repeat upper endoscopy in 2 months for                            retreatment. Procedure Code(s):        --- Professional ---                           [phone], Esophagogastroduodenoscopy, flexible,                            transoral; with biopsy, single or multiple Diagnosis Code(s):        --- Professional ---                           K22.2, Esophageal obstruction                           K20.8, Other esophagitis                           T66.XXXA, Radiation sickness, unspecified, initial  encounter                           K31.89, Other diseases of stomach and duodenum CPT copyright 2017 [employer]. All rights reserved. The codes documented in this report are preliminary and upon coder review may  be revised to meet current compliance requirements. Napoleon Form, MD 10/10/2017 12:35:45 PM This report has been signed electronically. Number of Addenda: 0

## 2017-10-10 NOTE — Anesthesia Procedure Notes (Addendum)
Procedure Name: Intubation Date/Time: 10/10/2017 11:42 AM Performed by: Anne Fu, CRNA Pre-anesthesia Checklist: Patient identified, Emergency Drugs available, Suction available, Patient being monitored and Timeout performed Patient Re-evaluated:Patient Re-evaluated prior to induction Oxygen Delivery Method: Circle system utilized Preoxygenation: Pre-oxygenation with 100% oxygen Induction Type: IV induction, Rapid sequence and Cricoid Pressure applied Laryngoscope Size: Mac and 4 Grade View: Grade I Tube type: Oral Tube size: 7.5 mm Number of attempts: 1 Airway Equipment and Method: Stylet Placement Confirmation: ETT inserted through vocal cords under direct vision,  positive ETCO2 and breath sounds checked- equal and bilateral Secured at: 21 cm Tube secured with: Tape Dental Injury: Teeth and Oropharynx as per pre-operative assessment

## 2017-10-10 NOTE — Anesthesia Preprocedure Evaluation (Signed)
Anesthesia Evaluation  Patient identified by MRN, date of birth, ID band Patient awake    Reviewed: Allergy & Precautions, NPO status , Patient's Chart, lab work & pertinent test results  Airway Mallampati: II  TM Distance: >3 FB     Dental  (+) Teeth Intact, Dental Advisory Given   Pulmonary former smoker,    breath sounds clear to auscultation       Cardiovascular hypertension,  Rhythm:Regular Rate:Normal     Neuro/Psych    GI/Hepatic   Endo/Other    Renal/GU      Musculoskeletal   Abdominal   Peds  Hematology   Anesthesia Other Findings   Reproductive/Obstetrics                             Anesthesia Physical Anesthesia Plan  ASA: III  Anesthesia Plan: General   Post-op Pain Management:    Induction: Intravenous  PONV Risk Score and Plan: Ondansetron and Dexamethasone  Airway Management Planned: Oral ETT  Additional Equipment:   Intra-op Plan:   Post-operative Plan: Extubation in OR  Informed Consent: I have reviewed the patients History and Physical, chart, labs and discussed the procedure including the risks, benefits and alternatives for the proposed anesthesia with the patient or authorized representative who has indicated his/her understanding and acceptance.   Dental advisory given  Plan Discussed with: CRNA and Anesthesiologist  Anesthesia Plan Comments:         Anesthesia Quick Evaluation

## 2017-10-10 NOTE — Interval H&P Note (Signed)
History and Physical Interval Note:  10/10/2017 11:10 AM  George Stokes  has presented today for surgery, with the diagnosis of dysphagia lung cancer  The various methods of treatment have been discussed with the patient and family. After consideration of risks, benefits and other options for treatment, the patient has consented to  Procedure(s): ESOPHAGOGASTRODUODENOSCOPY (EGD) WITH PROPOFOL (N/A) as a surgical intervention .  The patient's history has been reviewed, patient examined, no change in status, stable for surgery.  I have reviewed the patient's chart and labs.  Questions were answered to the patient's satisfaction.     [name]

## 2017-10-10 NOTE — Transfer of Care (Signed)
Immediate Anesthesia Transfer of Care Note  Patient: George Stokes  Procedure(s) Performed: ESOPHAGOGASTRODUODENOSCOPY (EGD) WITH PROPOFOL (N/A )  Patient Location: PACU  Anesthesia Type:General  Level of Consciousness: awake, alert  and oriented  Airway & Oxygen Therapy: Patient Spontanous Breathing and Patient connected to face mask oxygen  Post-op Assessment: Report given to RN and Post -op Vital signs reviewed and stable  Post vital signs: Reviewed and stable  Last Vitals:  Vitals Value Taken Time  BP 138/80 10/10/2017 12:34 PM  Temp    Pulse 75 10/10/2017 12:35 PM  Resp 13 10/10/2017 12:35 PM  SpO2 100 % 10/10/2017 12:35 PM  Vitals shown include unvalidated device data.  Last Pain:  Vitals:   10/10/17 1054  TempSrc: Oral  PainSc: 0-No pain         Complications: No apparent anesthesia complications

## 2017-10-10 NOTE — Discharge Instructions (Signed)
YOU HAD AN ENDOSCOPIC PROCEDURE TODAY: Refer to the procedure report and other information in the discharge instructions given to you for any specific questions about what was found during the examination. If this information does not answer your questions, please call [facility] office at [phone] to clarify.   YOU SHOULD EXPECT: Some feelings of bloating in the abdomen. Passage of more gas than usual. Walking can help get rid of the air that was put into your GI tract during the procedure and reduce the bloating. If you had a lower endoscopy (such as a colonoscopy or flexible sigmoidoscopy) you may notice spotting of blood in your stool or on the toilet paper. Some abdominal soreness may be present for a day or two, also.  DIET: Your first meal following the procedure should be a light meal and then it is ok to progress to your normal diet. A half-sandwich or bowl of soup is an example of a good first meal. Heavy or fried foods are harder to digest and may make you feel nauseous or bloated. Drink plenty of fluids but you should avoid alcoholic beverages for 24 hours. If you had a esophageal dilation, please see attached instructions for diet.    ACTIVITY: Your care partner should take you home directly after the procedure. You should plan to take it easy, moving slowly for the rest of the day. You can resume normal activity the day after the procedure however YOU SHOULD NOT DRIVE, use power tools, machinery or perform tasks that involve climbing or major physical exertion for 24 hours (because of the sedation medicines used during the test).   SYMPTOMS TO REPORT IMMEDIATELY: A gastroenterologist can be reached at any hour. Please call [phone]  for any of the following symptoms:   Following upper endoscopy (EGD, EUS, ERCP, esophageal dilation) Vomiting of blood or coffee ground material  New, significant abdominal pain  New, significant chest pain or pain under the shoulder blades  Painful or  persistently difficult swallowing  New shortness of breath  Black, tarry-looking or red, bloody stools  FOLLOW UP:  If any biopsies were taken you will be contacted by phone or by letter within the next 1-3 weeks. Call [phone]  if you have not heard about the biopsies in 3 weeks.  Please also call with any specific questions about appointments or follow up tests.  

## 2017-10-11 ENCOUNTER — Telehealth: Payer: Self-pay | Admitting: *Deleted

## 2017-10-11 NOTE — Telephone Encounter (Signed)
CALLED PATIENT'S BROTHER - DAVID TO INFORM FU WITH ALISON PERKINS HAS BEEN MOVED TO 10/25/17 @ 3:30 PM, LVM FOR A RETURN CALL

## 2017-10-12 DIAGNOSIS — L89150 Pressure ulcer of sacral region, unstageable: Secondary | ICD-10-CM | POA: Diagnosis not present

## 2017-10-13 ENCOUNTER — Other Ambulatory Visit: Payer: Self-pay

## 2017-10-13 ENCOUNTER — Inpatient Hospital Stay: Payer: Medicare Other | Attending: Oncology | Admitting: Nurse Practitioner

## 2017-10-13 ENCOUNTER — Encounter: Payer: Self-pay | Admitting: Nurse Practitioner

## 2017-10-13 ENCOUNTER — Telehealth: Payer: Self-pay

## 2017-10-13 VITALS — BP 137/78 | HR 97 | Temp 98.3°F | Resp 17 | Ht 67.0 in | Wt 117.1 lb

## 2017-10-13 DIAGNOSIS — C155 Malignant neoplasm of lower third of esophagus: Secondary | ICD-10-CM | POA: Insufficient documentation

## 2017-10-13 DIAGNOSIS — R1310 Dysphagia, unspecified: Secondary | ICD-10-CM

## 2017-10-13 DIAGNOSIS — E43 Unspecified severe protein-calorie malnutrition: Secondary | ICD-10-CM | POA: Diagnosis not present

## 2017-10-13 DIAGNOSIS — J449 Chronic obstructive pulmonary disease, unspecified: Secondary | ICD-10-CM | POA: Diagnosis not present

## 2017-10-13 DIAGNOSIS — Z923 Personal history of irradiation: Secondary | ICD-10-CM | POA: Insufficient documentation

## 2017-10-13 DIAGNOSIS — D649 Anemia, unspecified: Secondary | ICD-10-CM

## 2017-10-13 DIAGNOSIS — I10 Essential (primary) hypertension: Secondary | ICD-10-CM

## 2017-10-13 DIAGNOSIS — C159 Malignant neoplasm of esophagus, unspecified: Secondary | ICD-10-CM | POA: Diagnosis not present

## 2017-10-13 DIAGNOSIS — K222 Esophageal obstruction: Secondary | ICD-10-CM

## 2017-10-13 DIAGNOSIS — R531 Weakness: Secondary | ICD-10-CM | POA: Diagnosis not present

## 2017-10-13 DIAGNOSIS — R1319 Other dysphagia: Secondary | ICD-10-CM | POA: Diagnosis not present

## 2017-10-13 NOTE — Telephone Encounter (Signed)
Printed avs and calender of upcoming appointment. Per 4/18 los 

## 2017-10-13 NOTE — Progress Notes (Addendum)
George Stokes OFFICE PROGRESS NOTE   Diagnosis: Esophagus cancer  INTERVAL HISTORY:   George Stokes returns as scheduled.  Feeding tube became dislodged 10/09/2017.  The tube was replaced the same day.  Upper endoscopy 10/10/2017 with 1 severe stenosis found 40-41 cm from the incisors; severe esophagitis with bleeding found 39-41 cm from the incisors; diffuse mildly congested mucosa found at the gastroesophageal junction.  The stenosis was traversed after downsizing the scope to pediatric endoscope.  Biopsies of the GE junction and esophagus showed gastric mucosa with exudate, necrosis and granulation tissue consistent with ulcer; no intestinal metaplasia, dysplasia or malignancy.  He continues feedings via the J-tube.  This is his main source of nutrition.  He reports occasionally tolerating sips of water.  He denies pain.  He vomits intermittently.  Objective:  Vital signs in last 24 hours:  Blood pressure 137/78, pulse 97, temperature 98.3 F (36.8 C), temperature source Oral, resp. rate 17, height 5\' 7"  (1.702 m), weight 117 lb 1.6 oz (53.1 kg), SpO2 98 %.    HEENT: Neck without mass. Resp: Distant breath sounds.  No respiratory distress. Cardio: Regular rate and rhythm. GI: Abdomen soft and nontender.  No hepatomegaly.  Left upper quadrant feeding tube site without evidence of infection. Vascular: No leg edema.    Lab Results:  Lab Results  Component Value Date   WBC 11.3 (H) 09/20/2017   HGB 8.4 (L) 09/14/2017   HCT 31.1 (L) 09/20/2017   MCV 97.8 09/20/2017   PLT 394 09/20/2017   NEUTROABS 9.1 (H) 09/20/2017    Imaging:  No results found.  Medications: I have reviewed the patient's current medications.  Assessment/Plan: 1.Esophagus cancer  Distal esophageal stricture noted on esophagram 07/11/2017  CTs of the chest, abdomen, and pelvis 07/08/2017-right lung pneumonia, emphysema, no evidence of metastatic disease  Upper endoscopy 07/13/2017-GE  junction mass, could not be passed with standard endoscope, biopsy suspicious for adenocarcinoma  Right pleural fluid cytology 07/26/2017-negative  Initiation of weekly Taxol/carboplatin and radiation 08/01/2017, radiation completed 09/12/2017, completed 4 weeks of Taxol/carboplatin chemotherapy-last given 08/31/2017  PET scan 7/56/4332 hypermetabolic distal esophagus mass, no lymphadenopathy, airspace opacity in the right lower lobe with low-level hypermetabolism, nodules at the lung bases with low level hypermetabolism-indeterminate  Upper endoscopy 10/10/2017-1 severe stenosis found 40-41 cm from the incisors; severe esophagitis with bleeding found 39-41 cm from the incisors; diffuse mildly congested mucosa found at the gastroesophageal junction.  The stenosis was traversed after downsizing the scope to pediatric endoscope.  Biopsies of the GE junction and esophagus showed gastric mucosa with exudate, necrosis and granulation tissue consistent with ulcer; no intestinal metaplasia, dysplasia or malignancy.  2.Solid/liquid dysphagia secondary to #1  Placement of jejunostomy feeding tube 07/15/2017, revised 07/20/2017  Esophagram 09/05/2017-complete obstruction at the distal esophagus  Upper endoscopy 10/10/2017-see above  3.Weight loss/malnutrition, maintained on jejunostomy tube feedings  4.COPD  5.Heavy alcohol use  6.Gout  7.Hypertension  8.PneumoniaJanuary 2018  9.Anemia    Disposition: George Stokes appears unchanged.  The recent upper endoscopy showed ulceration, no tumor.  Dilation procedure is planned.  Our plan is to continue to follow with observation.  We scheduled a return visit in 4-5 weeks.  Patient seen with Dr. Benay Spice.    Ned Card ANP/GNP-BC   10/13/2017  12:33 PM  This was a shared visit with Ned Card.  George Stokes underwent an endoscopy.  No tumor was found.  He has a distal esophageal stricture.  He will follow-up with Dr.  Silverio Decamp  for an esophageal dilatation.  Julieanne Manson, MD

## 2017-10-18 ENCOUNTER — Emergency Department (HOSPITAL_COMMUNITY): Payer: Medicare Other

## 2017-10-18 ENCOUNTER — Encounter (HOSPITAL_COMMUNITY): Payer: Self-pay

## 2017-10-18 ENCOUNTER — Other Ambulatory Visit: Payer: Self-pay

## 2017-10-18 ENCOUNTER — Ambulatory Visit: Payer: Self-pay | Admitting: Radiation Oncology

## 2017-10-18 ENCOUNTER — Emergency Department (HOSPITAL_COMMUNITY)
Admission: EM | Admit: 2017-10-18 | Discharge: 2017-10-18 | Disposition: A | Discharge status: Home / Self Care | Payer: Medicare Other | Attending: Emergency Medicine | Admitting: Emergency Medicine

## 2017-10-18 DIAGNOSIS — I10 Essential (primary) hypertension: Secondary | ICD-10-CM | POA: Diagnosis not present

## 2017-10-18 DIAGNOSIS — Z87891 Personal history of nicotine dependence: Secondary | ICD-10-CM | POA: Insufficient documentation

## 2017-10-18 DIAGNOSIS — J449 Chronic obstructive pulmonary disease, unspecified: Secondary | ICD-10-CM | POA: Diagnosis not present

## 2017-10-18 DIAGNOSIS — K9423 Gastrostomy malfunction: Secondary | ICD-10-CM | POA: Diagnosis not present

## 2017-10-18 DIAGNOSIS — R109 Unspecified abdominal pain: Secondary | ICD-10-CM

## 2017-10-18 DIAGNOSIS — K9419 Other complications of enterostomy: Secondary | ICD-10-CM | POA: Diagnosis not present

## 2017-10-18 DIAGNOSIS — Y658 Other specified misadventures during surgical and medical care: Secondary | ICD-10-CM | POA: Insufficient documentation

## 2017-10-18 DIAGNOSIS — T85528A Displacement of other gastrointestinal prosthetic devices, implants and grafts, initial encounter: Secondary | ICD-10-CM | POA: Insufficient documentation

## 2017-10-18 DIAGNOSIS — Z79899 Other long term (current) drug therapy: Secondary | ICD-10-CM | POA: Diagnosis not present

## 2017-10-18 HISTORY — PX: IR DUODEN/JEJUNO TUBE INSERT PERCUT W/FL MOD SEC: IMG2330

## 2017-10-18 MED ORDER — IOPAMIDOL (ISOVUE-300) INJECTION 61%
INTRAVENOUS | Status: AC
  Administered 2017-10-18: 10 mL
  Filled 2017-10-18: qty 50

## 2017-10-18 MED ORDER — LIDOCAINE VISCOUS 2 % MT SOLN
OROMUCOSAL | Status: AC
  Administered 2017-10-18: 20:00:00
  Filled 2017-10-18: qty 15

## 2017-10-18 MED ORDER — IOPAMIDOL (ISOVUE-300) INJECTION 61%
50.0000 mL | Freq: Once | INTRAVENOUS | Status: AC | PRN
  Administered 2017-10-18: 10 mL

## 2017-10-18 NOTE — ED Triage Notes (Signed)
Pt removed j-tube accidentally overnight. No trauma. Some yellow fluid drainage. No complaints of pain. 2L [place] at base.

## 2017-10-18 NOTE — Procedures (Signed)
Interventional Radiology Procedure Note  Procedure: Jejunostomy replacement  Complications: None  Estimated Blood Loss: None  Findings: New 14 Fr balloon retention catheter placed over guidewire at exit site.  Contrast injection confirms positioning in jejunal lumen.  OK to use new catheter.  Venetia Night. Kathlene Cote, M.D Pager:  [phone]

## 2017-10-18 NOTE — ED Triage Notes (Signed)
Pt states he went to go use the urinal and the j tube became dislodged

## 2017-10-18 NOTE — Discharge Instructions (Addendum)
Tube replace per radiology

## 2017-10-18 NOTE — ED Provider Notes (Signed)
Patient returned form IR with j tube replaced and ready for c/c   Pattricia Boss, MD 10/18/17 [phone]

## 2017-10-18 NOTE — ED Notes (Signed)
Bed: WA04 Expected date:  Expected time:  Means of arrival:  Comments: EMS-J-tube removed

## 2017-10-18 NOTE — ED Provider Notes (Signed)
[facility] DEPT Provider Note   CSN: [id number] Arrival date & time: 10/18/17  1222     History   Chief Complaint No chief complaint on file.   HPI George Stokes is a 72 y.o. male.  72 year old male with history of esophageal cancer who has a J-tube chronically here today after his tube became dislodged several hours prior to arrival.  Denies any abdominal pain at the site.  Has not had any emesis.  No fever or chills.  Similar episode recently that was treated by IR placing a feeding tube.  No other complaints at this time     Past Medical History:  Diagnosis Date  . COPD (chronic obstructive pulmonary disease) ([place])   . Dyspnea   . esophageal ca dx'd 07/13/17   mass at GE junction  . Esophageal mass   . Gout   . Heart murmur    during teenage years   . Hypertension   . On home oxygen therapy    1-2 L/M   . Pneumonia     Patient Active Problem List   Diagnosis Date Noted  . Palliative care by specialist   . Goals of care, counseling/discussion   . Dehydration 09/10/2017  . Anemia 09/09/2017  . Malfunction of gastrostomy tube ([place]) 09/08/2017  . Pressure injury of skin 08/01/2017  . Atelectasis of right lung   . H/O exploratory laparotomy 07/20/2017  . Acute respiratory failure ([place]) 07/20/2017  . Protein-calorie malnutrition ([place]) 07/16/2017  . Malignant neoplasm of esophagus ([place])   . Dysphagia   . Esophageal stenosis   . Esophageal stricture   . Protein-calorie malnutrition, severe ([place]) 07/12/2017  . Hypomagnesemia   . Hypokalemia   . Lobar pneumonia ([place])   . Intractable nausea and vomiting 07/08/2017  . CAP (community acquired pneumonia) 07/08/2017  . Acute kidney injury ([place])   . Intractable vomiting with nausea 06/29/2017  . Sleep disturbance 01/04/2017  . Idiopathic chronic gout of multiple sites without tophus 01/04/2017  . Chronic respiratory failure with hypoxia ([place]) 01/13/2016  . COPD with hypoxia ([place])  05/03/2014  . Gout 10/30/2011  . Essential hypertension 10/30/2011  . Dyslipidemia 10/30/2011    Past Surgical History:  Procedure Laterality Date  . ESOPHAGOGASTRODUODENOSCOPY (EGD) WITH PROPOFOL N/A 07/13/2017   Procedure: ESOPHAGOGASTRODUODENOSCOPY (EGD) WITH PROPOFOL;  Surgeon: Mauri Pole, MD;  Location: [facility];  Service: Endoscopy;  Laterality: N/A;  . ESOPHAGOGASTRODUODENOSCOPY (EGD) WITH PROPOFOL N/A 10/10/2017   Procedure: ESOPHAGOGASTRODUODENOSCOPY (EGD) WITH PROPOFOL;  Surgeon: Mauri Pole, MD;  Location: [facility];  Service: Endoscopy;  Laterality: N/A;  . IR REPLC DUODEN/JEJUNO TUBE PERCUT W/FLUORO  08/16/2017  . IR REPLC DUODEN/JEJUNO TUBE PERCUT W/FLUORO  09/09/2017  . IR REPLC DUODEN/JEJUNO TUBE PERCUT W/FLUORO  10/09/2017  . LAPAROTOMY N/A 07/15/2017   Procedure: FEEDING JEJUNOSTOMY TUBE PLACEMENT;  Surgeon: Fanny Skates, MD;  Location: [facility] ORS;  Service: General;  Laterality: N/A;  . LAPAROTOMY N/A 07/20/2017   Procedure: EXPLORATORY LAPAROTOMY; REVISION OF JEJUNOSTOMY;  Surgeon: Excell Seltzer, MD;  Location: [facility];  Service: General;  Laterality: N/A;  . TONSILLECTOMY          Home Medications    Prior to Admission medications   Medication Sig Start Date End Date Taking? Authorizing Provider  acetaminophen (TYLENOL) 325 MG tablet Take 2 tablets (650 mg total) by mouth every 6 (six) hours as needed for mild pain (or Fever >/= 101). 09/14/17   Bonnielee Haff, MD  albuterol (PROVENTIL HFA;VENTOLIN  HFA) 108 (90 Base) MCG/ACT inhaler Inhale 2 puffs into the lungs every 6 (six) hours as needed for wheezing or shortness of breath. 08/02/17   Roxan Hockey, MD  Amino Acids-Protein Hydrolys (FEEDING SUPPLEMENT, PRO-STAT SUGAR FREE 64,) LIQD Take 30 mLs by mouth 2 (two) times daily with a meal. 09/14/17   Bonnielee Haff, MD  clotrimazole-betamethasone (LOTRISONE) cream Apply 1 application topically 2 (two) times daily. 01/04/17   Horald Pollen, MD  heparin lock flush 100 UNIT/ML SOLN injection 2.5 mLs (250 Units total) by Intracatheter route once as needed (Hickman or PICC flush). 08/02/17   Roxan Hockey, MD  metoCLOPramide (REGLAN) 10 MG/10ML SOLN Place 5 mLs (5 mg total) into feeding tube every 8 (eight) hours. 09/14/17   Bonnielee Haff, MD  Multiple Vitamin (MULTIVITAMIN) LIQD Place 15 mLs into feeding tube daily. 09/14/17   Bonnielee Haff, MD  Nutritional Supplements (FEEDING SUPPLEMENT, OSMOLITE 1.5 CAL,) LIQD 60 mL an hour via PEG tube continuously--with water flushes 50 mL every 4 hours 08/02/17   Roxan Hockey, MD  omeprazole (PRILOSEC) 2 mg/mL SUSP Take 20 mLs (40 mg total) by mouth 2 (two) times daily before a meal. 10/10/17   Nandigam, Venia Minks, MD  ondansetron (ZOFRAN) 8 MG tablet Take 1 tablet (8 mg total) by mouth every 8 (eight) hours. 09/08/17   Hayden Pedro, PA-C  Oxycodone HCl 10 MG TABS Take 1 tablet (10 mg total) by mouth every 4 (four) hours as needed. 09/14/17   Bonnielee Haff, MD  prochlorperazine (COMPAZINE) 10 MG tablet Take 10 mg by mouth every 6 (six) hours as needed for nausea or vomiting.    [provider]  promethazine (PHENERGAN) 6.25 MG/5ML syrup Take 6.25 mg by mouth every 6 (six) hours as needed for nausea or vomiting.    [provider]  sennosides (SENOKOT) 8.8 MG/5ML syrup Take 5 mLs by mouth at bedtime. 09/14/17   Bonnielee Haff, MD  sucralfate (CARAFATE) 1 GM/10ML suspension Take 10 mLs (1 g total) by mouth 4 (four) times daily. 10/10/17 10/10/18  Mauri Pole, MD  SYMBICORT 160-4.5 MCG/ACT inhaler Inhale 2 puffs into the lungs 2 (two) times daily. 01/24/17   Tanda Rockers, MD  zolpidem (AMBIEN) 5 MG tablet Take 1 tablet daily at bedtime as needed 05/01/15   Glenford Bayley, DO    Family History Family History  Problem Relation Age of Onset  . Dementia Mother   . Cancer Father   . Gout Father   . Thyroid disease Sister   . Kidney disease Brother   . Gout  Brother   . Diabetes Maternal Grandmother   . Stroke Maternal Grandfather     Social History Social History   Tobacco Use  . Smoking status: Former Smoker    Packs/day: 2.00    Years: 43.00    Pack years: 86.00    Types: Cigarettes    Last attempt to quit: 05/03/2009    Years since quitting: 8.4  . Smokeless tobacco: Never Used  Substance Use Topics  . Alcohol use: Not Currently    Alcohol/week: 0.0 oz    Comment: vodka daily;1-2 drinks in last 4 weeks   . Drug use: No     Allergies   Patient has no known allergies.   Review of Systems Review of Systems  All other systems reviewed and are negative.    Physical Exam Updated Vital Signs BP 135/80 (BP Location: Right Arm)   Pulse 88   Temp  97.9 F (36.6 C) (Oral)   Resp 16   Ht 1.702 m (5\' 7" )   Wt 54 kg (119 lb)   SpO2 98%   BMI 18.64 kg/m   Physical Exam  Constitutional: He is oriented to person, place, and time. He appears well-developed and well-nourished.  Non-toxic appearance. No distress.  HENT:  Head: Normocephalic and atraumatic.  Eyes: Pupils are equal, round, and reactive to light. Conjunctivae, EOM and lids are normal.  Neck: Normal range of motion. Neck supple. No tracheal deviation present. No thyroid mass present.  Cardiovascular: Normal rate, regular rhythm and normal heart sounds. Exam reveals no gallop.  No murmur heard. Pulmonary/Chest: Effort normal and breath sounds normal. No stridor. No respiratory distress. He has no decreased breath sounds. He has no wheezes. He has no rhonchi. He has no rales.  Abdominal: Soft. Normal appearance and bowel sounds are normal. He exhibits no distension. There is no tenderness. There is no rebound and no CVA tenderness.    Musculoskeletal: Normal range of motion. He exhibits no edema or tenderness.  Neurological: He is alert and oriented to person, place, and time. He has normal strength. No cranial nerve deficit or sensory deficit. GCS eye subscore is 4.  GCS verbal subscore is 5. GCS motor subscore is 6.  Skin: Skin is warm and dry. No abrasion and no rash noted.  Psychiatric: He has a normal mood and affect. His speech is normal and behavior is normal.  Nursing note and vitals reviewed.    ED Treatments / Results  Labs (all labs ordered are listed, but only abnormal results are displayed) Labs Reviewed - No data to display  EKG None  Radiology No results found.  Procedures Procedures (including critical care time)  Medications Ordered in ED Medications - No data to display   Initial Impression / Assessment and Plan / ED Course  I have reviewed the triage vital signs and the nursing notes.  Pertinent labs & imaging results that were available during my care of the patient were reviewed by me and considered in my medical decision making (see chart for details).     Discussed with interventional radiology and they will take the patient for replacement of his J-tube at 3:30 PM  Final Clinical Impressions(s) / ED Diagnoses   Final diagnoses:  Abdominal pain    ED Discharge Orders    None       Lacretia Leigh, MD 10/18/17 1440

## 2017-10-18 NOTE — ED Notes (Signed)
Pt being transported to IR for J-Tube replacement.

## 2017-10-18 NOTE — ED Notes (Signed)
[facility] has been called to verify that first available truck will come to pick up and transport pt back to place of residence.

## 2017-10-18 NOTE — ED Notes (Signed)
Bed: [facility] Expected date:  Expected time:  Means of arrival:  Comments: 

## 2017-10-19 ENCOUNTER — Encounter: Payer: Self-pay | Admitting: Radiation Oncology

## 2017-10-19 DIAGNOSIS — L89150 Pressure ulcer of sacral region, unstageable: Secondary | ICD-10-CM | POA: Diagnosis not present

## 2017-10-19 NOTE — Progress Notes (Signed)
Radiation Oncology         [phone] ________________________________  Name: George Stokes MRN: [id number]  Date: 10/19/2017  DOB: Oct 10, 1945  End of Treatment Note  Diagnosis: Malignant neoplasm of lower third of esophagus     Indication for treatment: Curative     Radiation treatment dates:  08/01/17-09/12/17  Site/dose: 1) Esophagus/ 45 Gy in 25 fractions 2) Esophagus boost/ 5.4 Gy in 3 fractions  Beams/energy: 1) 3D/ 6X 2) 3D/ 6X  Narrative: The patient tolerated radiation treatment relatively well. During treatment the patient complained of fatigue. He denied pain or difficulty swallowing.   Plan: The patient has completed radiation treatment. The patient will return to radiation oncology clinic for routine followup in one month. I advised them to call or return sooner if they have any questions or concerns related to their recovery or treatment.  ------------------------------------------------  Radene Gunning, MD, PhD  This document serves as a record of services personally performed by Dorothy Puffer, MD. It was created on his behalf by Tawni Levy, a trained medical scribe. The creation of this record is based on the scribe's personal observations and the provider's statements to them. This document has been checked and approved by the attending provider.

## 2017-10-24 ENCOUNTER — Other Ambulatory Visit: Payer: Self-pay | Admitting: *Deleted

## 2017-10-24 NOTE — Progress Notes (Signed)
Radiation Oncology         [phone] ________________________________  Name: George Stokes MRN: [id number]  Date of Service: 10/25/2017 DOB: [birth year]  Post Treatment Note  CC: Patient, No Pcp Per  Ladene Artist, MD  Diagnosis: Malignant neoplasm of lower third of esophagus  Interval Since Last Radiation:  6 weeks   08/01/17-09/12/17: 1. Esophagus/ 45 Gy in 25 fractions 2. Esophagus boost/ 5.4 Gy in 3 fractions   Narrative:  The patient returns today for routine follow-up.  The patient in summary was quite malnourished at the onset of our meeting.  He was seen in the hospital setting when he was initially diagnosed with esophagus cancer back in January.  He proceeded with chemo RT and completed this regimen on 09/12/2017.  He did require jejunostomy tube for nutrition, and has been residing at [facility] since his hospitalization.  He did undergo recent evaluation due to dysphasia and had an EGD on 10/09/2017.  He is scheduled to return for evaluation in July but during that assessment he did have a biopsy of the lesion that did not reveal any residual carcinoma.  He did have some stricturing, but was told to consider using clear liquids as of starting point for his diet.  He has not been able to have this because there was no order sent to [facility] apparently.  He is questioning whether he can begin eating and drinking as he would like to and is hopeful to go home in the near future.  Over the weekend however he did have his J-tube come dislodged and this was replaced by interventional radiology.  This has been functioning well since he reports.                            On review of systems, the patient states he is not having any problems with dysphasia this time but is only swallowing small sips of water in his saliva.  He denies any burning sensation when he does this.  He is not experiencing any pain in his body he states, and has been very pleased with his progress in terms of  moving forward with physical therapy and regaining his strength.  ALLERGIES:  has No Known Allergies.  Meds: Current Outpatient Medications  Medication Sig Dispense Refill  . albuterol (PROVENTIL HFA;VENTOLIN HFA) 108 (90 Base) MCG/ACT inhaler Inhale 2 puffs into the lungs every 6 (six) hours as needed for wheezing or shortness of breath. 1 Inhaler 2  . Amino Acids-Protein Hydrolys (FEEDING SUPPLEMENT, PRO-STAT SUGAR FREE 64,) LIQD Take 30 mLs by mouth 2 (two) times daily with a meal. 900 mL 0  . clotrimazole-betamethasone (LOTRISONE) cream Apply 1 application topically 2 (two) times daily. 30 g 1  . indomethacin (INDOCIN) 25 MG capsule Take 25 mg by mouth 2 (two) times daily with a meal.    . metoCLOPramide (REGLAN) 10 MG/10ML SOLN Place 5 mLs (5 mg total) into feeding tube every 8 (eight) hours. 600 mL 0  . Multiple Vitamin (MULTIVITAMIN) LIQD Place 15 mLs into feeding tube daily. 473 mL 1  . Multiple Vitamins-Minerals (DECUBI-VITE PO) Take 1 capsule by mouth. Via tube once daily for wound healing    . Nutritional Supplements (FEEDING SUPPLEMENT, OSMOLITE 1.5 CAL,) LIQD 60 mL an hour via PEG tube continuously--with water flushes 50 mL every 4 hours (Patient taking differently: 60 mL an hour via PEG tube continuously--with water flushes 50 mL every 4 hours  Flush J tube with 30 cc before and after medication 5 cc water flush between each medication adminstration) 1000 mL 0  . omeprazole (PRILOSEC) 2 mg/mL SUSP Take 20 mLs (40 mg total) by mouth 2 (two) times daily before a meal. 1200 mL 3  . ondansetron (ZOFRAN) 8 MG tablet Take 1 tablet (8 mg total) by mouth every 8 (eight) hours. 30 tablet 1  . Oxycodone HCl 10 MG TABS Take 1 tablet (10 mg total) by mouth every 4 (four) hours as needed. 15 tablet 0  . OXYGEN Inhale 4 L/mL into the lungs. 4 L/M continuous nasal cannular to maintain sats above 90%    . promethazine (PHENERGAN) 6.25 MG/5ML syrup Take by mouth every 6 (six) hours as needed for  nausea or vomiting.    . sennosides (SENOKOT) 8.8 MG/5ML syrup Take 5 mLs by mouth at bedtime. 240 mL 0  . sucralfate (CARAFATE) 1 GM/10ML suspension Take 10 mLs (1 g total) by mouth 4 (four) times daily. 420 mL 1  . SYMBICORT 160-4.5 MCG/ACT inhaler Inhale 2 puffs into the lungs 2 (two) times daily. 1 Inhaler 11  . acetaminophen (TYLENOL) 325 MG tablet Take 2 tablets (650 mg total) by mouth every 6 (six) hours as needed for mild pain (or Fever >/= 101). (Patient not taking: Reported on 10/25/2017)    . heparin lock flush 100 UNIT/ML SOLN injection 2.5 mLs (250 Units total) by Intracatheter route once as needed (Hickman or PICC flush). (Patient not taking: Reported on 10/25/2017) 30 Syringe 0  . predniSONE (DELTASONE) 20 MG tablet Take 20 mg by mouth daily with breakfast. Once daily for 5 days    . prochlorperazine (COMPAZINE) 10 MG tablet Take 10 mg by mouth every 6 (six) hours as needed for nausea or vomiting.    Marland Kitchen zolpidem (AMBIEN) 5 MG tablet Take 1 tablet daily at bedtime as needed (Patient not taking: Reported on 10/18/2017) 30 tablet 0   No current facility-administered medications for this encounter.     Physical Findings:  height is 5\' 7"  (1.702 m) and weight is 118 lb 9.6 oz (53.8 kg). His oral temperature is 98 F (36.7 C). His blood pressure is 141/82 (abnormal) and his pulse is 89. His respiration is 18 and oxygen saturation is 94%.  Pain Assessment Pain Score: 0-No pain/10 In general this is a cachectic though improved appearing Caucasian gentleman in no acute distress.  He's alert and oriented x4 and appropriate throughout the examination. Cardiopulmonary assessment is negative for acute distress and he exhibits normal effort.   Lab Findings: Lab Results  Component Value Date   WBC 11.3 (H) 09/20/2017   HGB 10.1 (L) 09/20/2017   HCT 31.1 (L) 09/20/2017   MCV 97.8 09/20/2017   PLT 394 09/20/2017     Radiographic Findings: Ir Duoden/jejuno Tamsen Snider Insert Percut W/fl Mod  Sec  Result Date: 10/18/2017 INDICATION: Jejunostomy catheter has fallen out. EXAM: REPLACEMENT OF PERCUTANEOUS JEJUNOSTOMY FEEDING CATHETER MEDICATIONS: None ANESTHESIA/SEDATION: None CONTRAST:  10 mL Isovue-300-administered into the gastric lumen. FLUOROSCOPY TIME:  Fluoroscopy Time: 12 seconds.  0.9 mGy. COMPLICATIONS: None immediate. PROCEDURE: Informed written consent was obtained from the patient after a thorough discussion of the procedural risks, benefits and alternatives. All questions were addressed. A timeout was performed prior to the initiation of the procedure. A 5 French catheter was advanced through the left abdominal wall exit site of a previously placed jejunostomy tube. A guidewire was advanced into the jejunal lumen. A 14 French balloon retention  catheter was then advanced over the guidewire. The retention balloon was inflated with 5 mL of saline. The catheter was injected with contrast material under fluoroscopy and a fluoroscopic spot image saved to confirm catheter position. FINDINGS: The new 22 French balloon retention catheter was advanced into the jejunal lumen. IMPRESSION: Replacement of jejunostomy with new 14 French balloon retention catheter. The tip lies in the jejunal lumen. Electronically Signed   By: Irish Lack M.D.   On: 10/18/2017 16:46   Ir Replc Duoden/jejuno Tube Percut W/fluoro  Result Date: 10/09/2017 INDICATION: 72 year old with a jejunal feeding tube. The feeding tube was completely dislodged and patient needs a new tube for nutrition. EXAM: REPLACEMENT OF JEJUNAL FEEDING TUBE MEDICATIONS: None ANESTHESIA/SEDATION: None CONTRAST:  10 mL injected into the bowel lumen. FLUOROSCOPY TIME:  Fluoroscopy Time: 18 seconds, 0.9 mGy COMPLICATIONS: None immediate. PROCEDURE: Patient's abdomen was evaluated. There was a small opening from the dislodged jejunal feeding tube. The left side of the abdomen was prepped and draped in a sterile fashion. Maximal barrier sterile  technique was utilized including caps, mask, sterile gowns, sterile gloves, sterile drape, hand hygiene and skin antiseptic. A 16 French Entuit balloon retention feeding tube was advanced into the jejunum using viscous lidocaine. Balloon was inflated with 5 mL of saline. Contrast was injected to confirm placement in the small bowel. Fluoroscopic images were taken and saved for this procedure. FINDINGS: Feeding tube is well positioned in the small bowel. IMPRESSION: Successful replacement of the jejunal feeding tube. Tube is ready to be used. Electronically Signed   By: Richarda Overlie M.D.   On: 10/09/2017 08:19   Dg C-arm 1-60 Min-no Report  Result Date: 10/10/2017 Fluoroscopy was utilized by the requesting physician.  No radiographic interpretation.    Impression/Plan: 1. Malignant neoplasm of lower third of esophagus.  Clinically the patient continues to improve.  It also appears that he is improving based on direct assessment on endoscopy.  We will follow him as needed moving forward.  He will return to see Dr. Truett Perna in May.  2. Concerns with advancing diet.  Given the patient's lack of oral intake for such a long time and dependency on his J-tube, I will send him for modified barium swallow to assess what is safe for him to eat.  We asked that the therapist performing this make recommendations in writing and submit to [facility] when he goes for this procedure.       Osker Mason, PAC

## 2017-10-24 NOTE — Patient Outreach (Signed)
[facility]) Care Management  10/24/2017  George Stokes [birth year] [id number]  Per facility, patient is now hoping to discharge home instead of remaining LTC or going to ALF. Patient has peg tube, he has history of COPD, esophageal cancer,CRF.   Met with patient at bedside of facility.  Patient lying in bed, no shirt,  oxygen tubing in his mouth, reading a book, upon RNCM first entering room.  Patient confirms he plans to go home, he reports he has an MD appointment this week and wants to discuss his progress with his MD and around learning to become independent with tube feedings.  Patient states he is not independent as of yet and denies that facility has educated him on how to manage his tube or feedings. He does live alone and may need some help, he is unsure if he will have to get private paid assistance or not. He is hoping to be independent.  Patient has been at the facility since February and is getting close to using his 100 day Medicare benefit.   RNCM reviewed [facility] program. Patient does agree to have [facility] LCSW speak with him about discharge plans and resources. He may need transition of care and pharmacy. However, patient reserves the right to refuse services at any time.  Patient did sign [facility] care management consent form, he wrote in under patient rights "patient under no obligations".  Patient reports he goes to urgent care with no PCP, his pcp is listed as [facility].   Plan to refer to [facility] LCSW to follow patient at facility for assistance with complicated discharge planning needs.  Royetta Crochet. Laymond Purser, RN, BSN, [place] [phone]) Business Cell  [phone]) Toll Free Office

## 2017-10-25 ENCOUNTER — Encounter: Payer: Self-pay | Admitting: Radiation Oncology

## 2017-10-25 ENCOUNTER — Ambulatory Visit
Admission: RE | Admit: 2017-10-25 | Discharge: 2017-10-25 | Disposition: A | Discharge status: Home / Self Care | Payer: No Typology Code available for payment source | Source: Ambulatory Visit | Attending: Radiation Oncology | Admitting: Radiation Oncology

## 2017-10-25 ENCOUNTER — Other Ambulatory Visit: Payer: Self-pay

## 2017-10-25 VITALS — BP 141/82 | HR 89 | Temp 98.0°F | Resp 18 | Ht 67.0 in | Wt 118.6 lb

## 2017-10-25 DIAGNOSIS — C159 Malignant neoplasm of esophagus, unspecified: Secondary | ICD-10-CM

## 2017-10-25 DIAGNOSIS — E46 Unspecified protein-calorie malnutrition: Secondary | ICD-10-CM | POA: Diagnosis not present

## 2017-10-25 DIAGNOSIS — Z9221 Personal history of antineoplastic chemotherapy: Secondary | ICD-10-CM | POA: Insufficient documentation

## 2017-10-25 DIAGNOSIS — Z79899 Other long term (current) drug therapy: Secondary | ICD-10-CM | POA: Insufficient documentation

## 2017-10-25 DIAGNOSIS — Z934 Other artificial openings of gastrointestinal tract status: Secondary | ICD-10-CM | POA: Insufficient documentation

## 2017-10-25 DIAGNOSIS — Z923 Personal history of irradiation: Secondary | ICD-10-CM | POA: Diagnosis not present

## 2017-10-25 DIAGNOSIS — C155 Malignant neoplasm of lower third of esophagus: Secondary | ICD-10-CM | POA: Insufficient documentation

## 2017-10-26 DIAGNOSIS — L89150 Pressure ulcer of sacral region, unstageable: Secondary | ICD-10-CM | POA: Diagnosis not present

## 2017-10-28 DIAGNOSIS — J449 Chronic obstructive pulmonary disease, unspecified: Secondary | ICD-10-CM | POA: Diagnosis not present

## 2017-10-28 DIAGNOSIS — K222 Esophageal obstruction: Secondary | ICD-10-CM | POA: Diagnosis not present

## 2017-10-28 DIAGNOSIS — R634 Abnormal weight loss: Secondary | ICD-10-CM | POA: Diagnosis not present

## 2017-10-28 DIAGNOSIS — C159 Malignant neoplasm of esophagus, unspecified: Secondary | ICD-10-CM | POA: Diagnosis not present

## 2017-10-31 ENCOUNTER — Telehealth: Payer: Self-pay | Admitting: Gastroenterology

## 2017-10-31 ENCOUNTER — Other Ambulatory Visit: Payer: Self-pay | Admitting: *Deleted

## 2017-10-31 NOTE — Patient Outreach (Signed)
[facility]) Care Management  10/31/2017  George Stokes 1945/12/24 [id number]   CSW attempted to meet with patient at [facility] (room#: [id number]) but patient was asleep when CSW went by to meet with him. CSW confirmed with Vickii Chafe at [facility] that he is planning to discharge home on Thursday, 5/23 and that [facility] will be providing 24 hour aide and nursing services. CSW will follow-up with patient closer to discharge.    Raynaldo Opitz, LCSW [facility]  Clinical Social Worker cell #: [phone]

## 2017-10-31 NOTE — Telephone Encounter (Signed)
Read pathology report and recommendations from Dr Silverio Decamp. Repeat EGD is on 12/12/17. She reports the patient says he is having less symptoms and is tolerating the liquids as far as she knows.

## 2017-10-31 NOTE — Telephone Encounter (Signed)
Patient sister and POA calling about results from endo done on 4.15.19 and would like a call to discuss.

## 2017-11-01 ENCOUNTER — Telehealth: Payer: Self-pay | Admitting: *Deleted

## 2017-11-01 ENCOUNTER — Other Ambulatory Visit: Payer: Self-pay

## 2017-11-01 ENCOUNTER — Non-Acute Institutional Stay: Payer: Self-pay | Admitting: Hospice and Palliative Medicine

## 2017-11-01 ENCOUNTER — Telehealth: Payer: Self-pay | Admitting: Gastroenterology

## 2017-11-01 DIAGNOSIS — Z515 Encounter for palliative care: Secondary | ICD-10-CM

## 2017-11-01 NOTE — Progress Notes (Signed)
PALLIATIVE CARE CONSULT VISIT   PATIENT NAME: George Stokes DOB: 03-08-1946 MRN: [id number]  PRIMARY CARE PROVIDER: Patient, No Pcp Per  REFERRING PROVIDER: Dr. Donette Larry  RESPONSIBLE PARTY:   self   RECOMMENDATIONS and PLAN:  1. Weakness - receiving rehab. Recent hospital trip to replace J tube. He is planning discharge home in 1-2 weeks and will likely benefit from home health. I would also recommend palliative care follow in the home. 2. Pain - he is being tx'd for gout. He requests hydrocodone for general pain, which he takes at home. However, he has oxycodone ordered prn. He is also being managed with indomethacin.  3. GOC - case discussed with PA at facility. Patient has poor understanding of his illness and potential health trajectory despite multiple conversations about same. He says that he is cured of cancer and is very optimistic about his health status and going home. I note follow up visit to see George Stokes in May.    I spent 10 minutes providing this consultation,  from 1300 to 1310. More than 50% of the time in this consultation was spent coordinating communication.   HISTORY OF PRESENT ILLNESS:  George Stokes is a 72 yo man with multiple medical problems including oxygen dependent COPD, HTN, history of alcohol abuse, dysphagia, who had a recent prolonged hospitalization and was found to have esophageal cancer with distal stricture requiring J tube placement and short term intubation.  He was discharged to rehab and is s/p XRT. Routine follow up visit today.   CODE STATUS: FULL CODE  PPS: 40% HOSPICE ELIGIBILITY/DIAGNOSIS: TBD  PAST MEDICAL HISTORY:  Past Medical History:  Diagnosis Date  . COPD (chronic obstructive pulmonary disease) (HCC)   . Dyspnea   . esophageal ca dx'd 07/13/17   mass at GE junction  . Esophageal mass   . Gout   . Heart murmur    during teenage years   . Hypertension   . On home oxygen therapy    1-2 L/M   . Pneumonia     SOCIAL HX:    Social History   Tobacco Use  . Smoking status: Former Smoker    Packs/day: 2.00    Years: 43.00    Pack years: 86.00    Types: Cigarettes    Last attempt to quit: 05/03/2009    Years since quitting: 8.5  . Smokeless tobacco: Never Used  Substance Use Topics  . Alcohol use: Not Currently    Alcohol/week: 0.0 oz    Comment: vodka daily;1-2 drinks in last 4 weeks     ALLERGIES: No Known Allergies   PERTINENT MEDICATIONS:  Outpatient Encounter Medications as of 11/01/2017  Medication Sig  . acetaminophen (TYLENOL) 325 MG tablet Take 2 tablets (650 mg total) by mouth every 6 (six) hours as needed for mild pain (or Fever >/= 101). (Patient not taking: Reported on 10/25/2017)  . albuterol (PROVENTIL HFA;VENTOLIN HFA) 108 (90 Base) MCG/ACT inhaler Inhale 2 puffs into the lungs every 6 (six) hours as needed for wheezing or shortness of breath.  . Amino Acids-Protein Hydrolys (FEEDING SUPPLEMENT, PRO-STAT SUGAR FREE 64,) LIQD Take 30 mLs by mouth 2 (two) times daily with a meal.  . clotrimazole-betamethasone (LOTRISONE) cream Apply 1 application topically 2 (two) times daily.  . heparin lock flush 100 UNIT/ML SOLN injection 2.5 mLs (250 Units total) by Intracatheter route once as needed ([place] or PICC flush). (Patient not taking: Reported on 10/25/2017)  . indomethacin (INDOCIN) 25 MG capsule Take  25 mg by mouth 2 (two) times daily with a meal.  . metoCLOPramide (REGLAN) 10 MG/10ML SOLN Place 5 mLs (5 mg total) into feeding tube every 8 (eight) hours.  . Multiple Vitamin (MULTIVITAMIN) LIQD Place 15 mLs into feeding tube daily.  . Multiple Vitamins-Minerals (DECUBI-VITE PO) Take 1 capsule by mouth. Via tube once daily for wound healing  . Nutritional Supplements (FEEDING SUPPLEMENT, OSMOLITE 1.5 CAL,) LIQD 60 mL an hour via PEG tube continuously--with water flushes 50 mL every 4 hours (Patient taking differently: 60 mL an hour via PEG tube continuously--with water flushes 50 mL every 4  hours Flush J tube with 30 cc before and after medication 5 cc water flush between each medication adminstration)  . omeprazole (PRILOSEC) 2 mg/mL SUSP Take 20 mLs (40 mg total) by mouth 2 (two) times daily before a meal.  . ondansetron (ZOFRAN) 8 MG tablet Take 1 tablet (8 mg total) by mouth every 8 (eight) hours.  . Oxycodone HCl 10 MG TABS Take 1 tablet (10 mg total) by mouth every 4 (four) hours as needed.  . OXYGEN Inhale 4 L/mL into the lungs. 4 L/M continuous nasal cannular to maintain sats above 90%  . predniSONE (DELTASONE) 20 MG tablet Take 20 mg by mouth daily with breakfast. Once daily for 5 days  . prochlorperazine (COMPAZINE) 10 MG tablet Take 10 mg by mouth every 6 (six) hours as needed for nausea or vomiting.  . promethazine (PHENERGAN) 6.25 MG/5ML syrup Take by mouth every 6 (six) hours as needed for nausea or vomiting.  . sennosides (SENOKOT) 8.8 MG/5ML syrup Take 5 mLs by mouth at bedtime.  . sucralfate (CARAFATE) 1 GM/10ML suspension Take 10 mLs (1 g total) by mouth 4 (four) times daily.  . SYMBICORT 160-4.5 MCG/ACT inhaler Inhale 2 puffs into the lungs 2 (two) times daily.  Marland Kitchen zolpidem (AMBIEN) 5 MG tablet Take 1 tablet daily at bedtime as needed (Patient not taking: Reported on 10/18/2017)   No facility-administered encounter medications on file as of 11/01/2017.     PHYSICAL EXAM:   General: NAD, frail appearing, thin, lying in bed Pulmonary: unlabored, wearing O2 Extremities: no edema, no joint deformities Skin: no rashes Neurological: Weakness but otherwise nonfocal  Malachy Moan, NP

## 2017-11-01 NOTE — Telephone Encounter (Signed)
Orders had been faxed. Typed orders and faxed to [facility]. Copy is in the Epic under letters.

## 2017-11-01 NOTE — Telephone Encounter (Signed)
"  I'm calling to speak with the PA in reference to a test my brother had a few weeks ago.  Before I talk with her could you tell me if my brother had a swallowing study?"  Will notify providers of this request. Call lost during hold.  No return call by this nurse.

## 2017-11-03 NOTE — Telephone Encounter (Addendum)
Call from pt sister inquiring about swallowing study eval. This RN will contact [facility] office to inquire about appt and reach back out. Sister voiced understanding.    Pt scheduled for swallow study May 17th at 1pm at [facility]. Pt to arrive at [facility] at 12:45pm. Pt sister voiced understanding  Informed Lennette Bihari at [facility] of appt date and time.

## 2017-11-08 ENCOUNTER — Other Ambulatory Visit: Payer: Self-pay | Admitting: Radiation Oncology

## 2017-11-08 DIAGNOSIS — C159 Malignant neoplasm of esophagus, unspecified: Secondary | ICD-10-CM

## 2017-11-11 ENCOUNTER — Ambulatory Visit (HOSPITAL_COMMUNITY)
Admission: RE | Admit: 2017-11-11 | Discharge: 2017-11-11 | Disposition: A | Discharge status: Home / Self Care | Payer: No Typology Code available for payment source | Source: Ambulatory Visit | Attending: Radiation Oncology | Admitting: Radiation Oncology

## 2017-11-11 ENCOUNTER — Other Ambulatory Visit (HOSPITAL_COMMUNITY): Payer: Self-pay | Admitting: Radiation Oncology

## 2017-11-11 DIAGNOSIS — R1319 Other dysphagia: Secondary | ICD-10-CM | POA: Diagnosis not present

## 2017-11-11 DIAGNOSIS — J449 Chronic obstructive pulmonary disease, unspecified: Secondary | ICD-10-CM | POA: Diagnosis not present

## 2017-11-11 DIAGNOSIS — R1314 Dysphagia, pharyngoesophageal phase: Secondary | ICD-10-CM | POA: Diagnosis not present

## 2017-11-11 DIAGNOSIS — C159 Malignant neoplasm of esophagus, unspecified: Secondary | ICD-10-CM | POA: Diagnosis not present

## 2017-11-11 DIAGNOSIS — R1310 Dysphagia, unspecified: Secondary | ICD-10-CM

## 2017-11-11 DIAGNOSIS — K222 Esophageal obstruction: Secondary | ICD-10-CM | POA: Insufficient documentation

## 2017-11-11 DIAGNOSIS — R634 Abnormal weight loss: Secondary | ICD-10-CM | POA: Diagnosis not present

## 2017-11-16 ENCOUNTER — Other Ambulatory Visit: Payer: Self-pay | Admitting: *Deleted

## 2017-11-16 NOTE — Patient Outreach (Signed)
[place] [facility]) Care Management  11/16/2017  George Stokes May 08, 1946 [id number]   CSW met with patient at [facility] to discuss discharge plans. Patient reports that he plans to discharge back to his apartment tomorrow after his appointment with his oncologist, Dr. Benay Spice at 12:30. Patient reports that his sister, [name] is planning to transport him home and he is looking forward to returning home. Patient reports that he will have [facility] follow for home health. Patient has exhausted his 100 days here at [facility], with his 100th day being 11/15/17. CSW will make referral to [facility] for transition of care & sign off as patient reports no social work needs at this time.    Raynaldo Opitz, LCSW [facility]  Clinical Social Worker cell #: [phone]

## 2017-11-17 ENCOUNTER — Inpatient Hospital Stay: Payer: Medicare Other | Attending: Oncology | Admitting: Oncology

## 2017-11-17 ENCOUNTER — Telehealth: Payer: Self-pay | Admitting: Oncology

## 2017-11-17 ENCOUNTER — Other Ambulatory Visit: Payer: Self-pay

## 2017-11-17 VITALS — BP 123/82 | HR 94 | Temp 97.8°F | Resp 18 | Ht 67.0 in | Wt 124.9 lb

## 2017-11-17 DIAGNOSIS — C155 Malignant neoplasm of lower third of esophagus: Secondary | ICD-10-CM

## 2017-11-17 DIAGNOSIS — R634 Abnormal weight loss: Secondary | ICD-10-CM

## 2017-11-17 DIAGNOSIS — F1010 Alcohol abuse, uncomplicated: Secondary | ICD-10-CM | POA: Diagnosis not present

## 2017-11-17 DIAGNOSIS — Z923 Personal history of irradiation: Secondary | ICD-10-CM | POA: Insufficient documentation

## 2017-11-17 DIAGNOSIS — R1319 Other dysphagia: Secondary | ICD-10-CM | POA: Diagnosis not present

## 2017-11-17 DIAGNOSIS — J449 Chronic obstructive pulmonary disease, unspecified: Secondary | ICD-10-CM | POA: Insufficient documentation

## 2017-11-17 DIAGNOSIS — I10 Essential (primary) hypertension: Secondary | ICD-10-CM

## 2017-11-17 NOTE — Progress Notes (Signed)
  [place] OFFICE PROGRESS NOTE   Diagnosis: Esophagus cancer  INTERVAL HISTORY:   George Stokes returns as scheduled.  He underwent an upper endoscopy 10/10/2017.  A severe stenosis was found at 40-41 cm from the incisors.  The stenosis was traversed with a pediatric endoscope.  No visible mass lesion.  Biopsies from the GE junction and esophagus revealed necrosis and granulation tissue consistent with an ulcer.  No malignancy. He was discharged from the skilled nursing facility today.  He continues tube feedings and will have home nursing care.  He has no difficulty swallowing secretions.  He has not tried solids. A speech therapy evaluation on 11/11/2017 revealed mild aspiration risk. Thin liquids are recommended. Objective:  Vital signs in last 24 hours:  Blood pressure 123/82, pulse 94, temperature 97.8 F (36.6 C), temperature source Oral, resp. rate 18, height 5\' 7"  (1.702 m), weight 124 lb 14.4 oz (56.7 kg), SpO2 95 %.    HEENT: No thrush Lymphatics: No cervical or supraclavicular nodes.  "Shotty "right axillary nodes Resp: Distant breath sounds, scattered inspiratory rhonchi, no respiratory distress Cardio: Regular rate and rhythm GI: No hepatomegaly, no mass, nontender, upper abdomen feeding tube site with a gauze dressing Vascular: No leg edema    Medications: I have reviewed the patient's current medications.   Assessment/Plan: 1.Esophagus cancer  Distal esophageal stricture noted on esophagram 07/11/2017  CTs of the chest, abdomen, and pelvis 07/08/2017-right lung pneumonia, emphysema, no evidence of metastatic disease  Upper endoscopy 07/13/2017-GE junction mass, could not be passed with standard endoscope, biopsy suspicious for adenocarcinoma  Right pleural fluid cytology 07/26/2017-negative  Initiation of weekly Taxol/carboplatin and radiation 08/01/2017, radiation completed 09/12/2017, completed 4 weeks of Taxol/carboplatin chemotherapy-last given  08/31/2017  PET scan 6/76/7209 hypermetabolic distal esophagus mass, no lymphadenopathy, airspace opacity in the right lower lobe with low-level hypermetabolism, nodules at the lung baseswith low level hypermetabolism-indeterminate  Upper endoscopy 10/10/2017-1 severe stenosis found 40-41 cm from the incisors; severe esophagitis with bleeding found 39-41 cm from the incisors; diffuse mildly congested mucosa found at the gastroesophageal junction.  The stenosis was traversed after downsizing the scope to pediatric endoscope.  Biopsies of the GE junction and esophagus showed gastric mucosa with exudate, necrosis and granulation tissue consistent with ulcer; no intestinal metaplasia, dysplasia or malignancy.  2.Solid/liquid dysphagia secondary to #1  Placement of jejunostomy feeding tube 07/15/2017, revised 07/20/2017  Esophagram 09/05/2017-complete obstruction at the distal esophagus  Upper endoscopy 10/10/2017-see above  3.Weight loss/malnutrition, maintained on jejunostomy tube feedings  4.COPD  5.Heavy alcohol use  6.Gout  7.Hypertension  8.PneumoniaJanuary 2018  9.History of anemia   Disposition: George Stokes has a history of esophagus cancer.  His performance status appears improved.  He will begin a liquid diet as recommended by speech pathology.  We will make a referral to home speech pathology.  He will continue follow-up with Dr. Silverio Decamp for a repeat endoscopy and esophagus dilatation as needed.  He will return for office visit here in 3 months.  15 minutes were spent with the patient today.  The majority of the time was used for counseling and coordination of care.  Betsy Coder, MD  11/17/2017  1:50 PM

## 2017-11-17 NOTE — Telephone Encounter (Signed)
Scheduled appt per 5/23 los - sent reminder letter in the mail with appt date and time. F/u in 3 months.

## 2017-11-18 ENCOUNTER — Other Ambulatory Visit: Payer: Self-pay | Admitting: *Deleted

## 2017-11-18 DIAGNOSIS — J9611 Chronic respiratory failure with hypoxia: Secondary | ICD-10-CM | POA: Diagnosis not present

## 2017-11-18 DIAGNOSIS — E43 Unspecified severe protein-calorie malnutrition: Secondary | ICD-10-CM | POA: Diagnosis not present

## 2017-11-18 DIAGNOSIS — J449 Chronic obstructive pulmonary disease, unspecified: Secondary | ICD-10-CM | POA: Diagnosis not present

## 2017-11-18 DIAGNOSIS — M109 Gout, unspecified: Secondary | ICD-10-CM | POA: Diagnosis not present

## 2017-11-18 DIAGNOSIS — Z431 Encounter for attention to gastrostomy: Secondary | ICD-10-CM | POA: Diagnosis not present

## 2017-11-18 DIAGNOSIS — Z9181 History of falling: Secondary | ICD-10-CM | POA: Diagnosis not present

## 2017-11-18 DIAGNOSIS — R1314 Dysphagia, pharyngoesophageal phase: Secondary | ICD-10-CM | POA: Diagnosis not present

## 2017-11-18 DIAGNOSIS — I10 Essential (primary) hypertension: Secondary | ICD-10-CM | POA: Diagnosis not present

## 2017-11-18 DIAGNOSIS — C159 Malignant neoplasm of esophagus, unspecified: Secondary | ICD-10-CM | POA: Diagnosis not present

## 2017-11-18 DIAGNOSIS — Z8701 Personal history of pneumonia (recurrent): Secondary | ICD-10-CM | POA: Diagnosis not present

## 2017-11-18 NOTE — Patient Outreach (Signed)
[place]) Care Management  11/18/2017  MUJTABA BOLLIG 09-Nov-1945 [id number]   Referral received from LCSW as member was recently discharged from SNF after spending 100 days.  Per chart, he has history of esophageal cancer, hypertension, dyslipidemia, and protein calorie malnutrition.  Call placed to listed home number for member [phone]) but number is invalid.  Call placed to member's listed cell number [phone]), however his brother [name] answer stating it's his number.  He is unsure why the home phone has been disconnected but will have member's sister look into it.    Call then placed to another cell phone number provided by brother [phone]).  Member answers phone but state he is unable to talk as he has company.  Request call back.  Will follow up within the next 2 business days.  Valente David, [place], MSN [place] [phone]

## 2017-11-19 DIAGNOSIS — J449 Chronic obstructive pulmonary disease, unspecified: Secondary | ICD-10-CM | POA: Diagnosis not present

## 2017-11-19 DIAGNOSIS — I10 Essential (primary) hypertension: Secondary | ICD-10-CM | POA: Diagnosis not present

## 2017-11-19 DIAGNOSIS — Z431 Encounter for attention to gastrostomy: Secondary | ICD-10-CM | POA: Diagnosis not present

## 2017-11-19 DIAGNOSIS — E43 Unspecified severe protein-calorie malnutrition: Secondary | ICD-10-CM | POA: Diagnosis not present

## 2017-11-19 DIAGNOSIS — R1314 Dysphagia, pharyngoesophageal phase: Secondary | ICD-10-CM | POA: Diagnosis not present

## 2017-11-19 DIAGNOSIS — C159 Malignant neoplasm of esophagus, unspecified: Secondary | ICD-10-CM | POA: Diagnosis not present

## 2017-11-20 DIAGNOSIS — R1314 Dysphagia, pharyngoesophageal phase: Secondary | ICD-10-CM | POA: Diagnosis not present

## 2017-11-20 DIAGNOSIS — Z431 Encounter for attention to gastrostomy: Secondary | ICD-10-CM | POA: Diagnosis not present

## 2017-11-20 DIAGNOSIS — C159 Malignant neoplasm of esophagus, unspecified: Secondary | ICD-10-CM | POA: Diagnosis not present

## 2017-11-20 DIAGNOSIS — E43 Unspecified severe protein-calorie malnutrition: Secondary | ICD-10-CM | POA: Diagnosis not present

## 2017-11-20 DIAGNOSIS — I10 Essential (primary) hypertension: Secondary | ICD-10-CM | POA: Diagnosis not present

## 2017-11-20 DIAGNOSIS — J449 Chronic obstructive pulmonary disease, unspecified: Secondary | ICD-10-CM | POA: Diagnosis not present

## 2017-11-22 ENCOUNTER — Other Ambulatory Visit: Payer: Self-pay | Admitting: *Deleted

## 2017-11-22 DIAGNOSIS — R1314 Dysphagia, pharyngoesophageal phase: Secondary | ICD-10-CM | POA: Diagnosis not present

## 2017-11-22 DIAGNOSIS — Z431 Encounter for attention to gastrostomy: Secondary | ICD-10-CM | POA: Diagnosis not present

## 2017-11-22 DIAGNOSIS — C159 Malignant neoplasm of esophagus, unspecified: Secondary | ICD-10-CM | POA: Diagnosis not present

## 2017-11-22 DIAGNOSIS — J449 Chronic obstructive pulmonary disease, unspecified: Secondary | ICD-10-CM | POA: Diagnosis not present

## 2017-11-22 DIAGNOSIS — I10 Essential (primary) hypertension: Secondary | ICD-10-CM | POA: Diagnosis not present

## 2017-11-22 DIAGNOSIS — E43 Unspecified severe protein-calorie malnutrition: Secondary | ICD-10-CM | POA: Diagnosis not present

## 2017-11-22 NOTE — Patient Outreach (Signed)
[facility]) Care Management  11/22/2017  George Stokes [birth year] [id number]   2nd attempt made to engage member with [facility] services.  He report he is already involved with [facility] for home health services.  Difference between home health and [facility] discussed, however he again state that he does not need additional services.  Advised to contact this care manager should needs change or if he has additional needs.  Will close case at this time.  Valente David, [place], MSN [place] [phone]

## 2017-11-23 DIAGNOSIS — Z431 Encounter for attention to gastrostomy: Secondary | ICD-10-CM | POA: Diagnosis not present

## 2017-11-23 DIAGNOSIS — I10 Essential (primary) hypertension: Secondary | ICD-10-CM | POA: Diagnosis not present

## 2017-11-23 DIAGNOSIS — C159 Malignant neoplasm of esophagus, unspecified: Secondary | ICD-10-CM | POA: Diagnosis not present

## 2017-11-23 DIAGNOSIS — R1314 Dysphagia, pharyngoesophageal phase: Secondary | ICD-10-CM | POA: Diagnosis not present

## 2017-11-23 DIAGNOSIS — J449 Chronic obstructive pulmonary disease, unspecified: Secondary | ICD-10-CM | POA: Diagnosis not present

## 2017-11-23 DIAGNOSIS — E43 Unspecified severe protein-calorie malnutrition: Secondary | ICD-10-CM | POA: Diagnosis not present

## 2017-11-24 ENCOUNTER — Telehealth: Payer: Self-pay | Admitting: *Deleted

## 2017-11-24 DIAGNOSIS — C159 Malignant neoplasm of esophagus, unspecified: Secondary | ICD-10-CM

## 2017-11-24 DIAGNOSIS — C155 Malignant neoplasm of lower third of esophagus: Secondary | ICD-10-CM

## 2017-11-24 NOTE — Telephone Encounter (Signed)
-----   Message from Ladell Pier, MD sent at 11/23/2017  8:41 PM EDT ----- Regarding: RE: Home Nursing Referral Ok, please make referral to nutrition and tube feedings ----- Message ----- From: Belva Chimes, RN Sent: 11/23/2017   4:03 PM To: Ladell Pier, MD Subject: Home Nursing Referral                          Sonia Baller from [facility] called to inquire about skilled nursing visit for [facility]. She is the Speech Therapist assigned to work with the patient. She states patient is very confused about nursing care. It does not look like we referred [facility] for nursing care, it was just for speech therapy. Patient needs home care referral for tube feedings and maintenance.

## 2017-11-25 ENCOUNTER — Telehealth: Payer: Self-pay | Admitting: Gastroenterology

## 2017-11-25 ENCOUNTER — Telehealth: Payer: Self-pay | Admitting: Nutrition

## 2017-11-25 DIAGNOSIS — R1314 Dysphagia, pharyngoesophageal phase: Secondary | ICD-10-CM | POA: Diagnosis not present

## 2017-11-25 DIAGNOSIS — I10 Essential (primary) hypertension: Secondary | ICD-10-CM | POA: Diagnosis not present

## 2017-11-25 DIAGNOSIS — J449 Chronic obstructive pulmonary disease, unspecified: Secondary | ICD-10-CM | POA: Diagnosis not present

## 2017-11-25 DIAGNOSIS — E43 Unspecified severe protein-calorie malnutrition: Secondary | ICD-10-CM | POA: Diagnosis not present

## 2017-11-25 DIAGNOSIS — Z431 Encounter for attention to gastrostomy: Secondary | ICD-10-CM | POA: Diagnosis not present

## 2017-11-25 DIAGNOSIS — C159 Malignant neoplasm of esophagus, unspecified: Secondary | ICD-10-CM | POA: Diagnosis not present

## 2017-11-25 NOTE — Telephone Encounter (Signed)
Needing to confirm medications. She will contact the PCP Dr Lysle Rubens.

## 2017-11-25 NOTE — Telephone Encounter (Signed)
Left message to call back  

## 2017-11-25 NOTE — Telephone Encounter (Signed)
Scheduled appt per sch message 5/30- unable to reach patient at either number listed/ no vmail.  - scheduled and sent a reminder letter in the mail.

## 2017-12-06 ENCOUNTER — Inpatient Hospital Stay: Payer: No Typology Code available for payment source | Attending: Oncology

## 2017-12-06 NOTE — Progress Notes (Signed)
Nutrition  Patient did not show up for nutrition visit today.    Chart reviewed.  Patient with esophageal cancer and no longer receiving treatments.  Patient had J-tube placed on 07/15/17.  Noted recent discharge from SNF to home.   Spoke with RD, Maudie Mercury at [facility] and they are currently are following patient for enteral nutrition.  Current tube feeding regimen osmolite 1.5 58ml/hr for 18 hours with promod 1.5 oz TID. Flushes of 214ml 5 times per day via J tube.  Noted SLP following patient as well.  [facility] RD will follow patient.    Please consult RD if can be of assistance.  Nikia Mangino B. Zenia Resides, [place], [place] Registered Dietitian [phone] (pager)

## 2017-12-08 ENCOUNTER — Telehealth: Payer: Self-pay | Admitting: Gastroenterology

## 2017-12-08 NOTE — Telephone Encounter (Signed)
Patient is scheduled with Dr.Nandigam for this Monday 6.17.19 at [facility] in the morning. Patient states he needs to reschedule this to the first open afternoon appt.

## 2017-12-08 NOTE — Telephone Encounter (Signed)
No answer at either number listed for the patient. No afternoons available at the hospital. Does he want to cancel the appointment?

## 2017-12-09 ENCOUNTER — Telehealth: Payer: Self-pay | Admitting: Gastroenterology

## 2017-12-09 NOTE — Telephone Encounter (Signed)
Pt will keep appt as scheduled.

## 2017-12-09 NOTE — Telephone Encounter (Signed)
Spoke with pt and let him know there are no afternoon hospital slots. Pt states he will leave appt as scheduled.

## 2017-12-12 ENCOUNTER — Encounter (HOSPITAL_COMMUNITY): Payer: Self-pay | Admitting: Registered Nurse

## 2017-12-12 ENCOUNTER — Ambulatory Visit (HOSPITAL_COMMUNITY): Admission: RE | Admit: 2017-12-12 | Payer: Medicare Other | Source: Ambulatory Visit | Admitting: Gastroenterology

## 2017-12-12 ENCOUNTER — Encounter (HOSPITAL_COMMUNITY): Admission: RE | Payer: Self-pay | Source: Ambulatory Visit

## 2017-12-12 SURGERY — ESOPHAGOGASTRODUODENOSCOPY (EGD) WITH PROPOFOL
Anesthesia: Monitor Anesthesia Care

## 2017-12-12 NOTE — Anesthesia Preprocedure Evaluation (Deleted)
Anesthesia Evaluation  Patient identified by MRN, date of birth, ID band Patient awake    Reviewed: Allergy & Precautions, NPO status , Patient's Chart, lab work & pertinent test results  Airway Mallampati: II  TM Distance: >3 FB Neck ROM: Full    Dental no notable dental hx. (+) Dental Advisory Given   Pulmonary COPD,  COPD inhaler and oxygen dependent, former smoker,    breath sounds clear to auscultation + decreased breath sounds      Cardiovascular hypertension, Pt. on medications Normal cardiovascular exam Rhythm:Regular Rate:Normal  '19 TTE - Moderate concentric LVH. EF 60% to 65%. Grade 2 diastolic dysfunction. LA was mildly dilated. RV and RA were mildly dilated. There was increased thickness of the atrial septum, consistent with lipomatous hypertrophy. Trivial TR and PR.   Neuro/Psych negative neurological ROS  negative psych ROS   GI/Hepatic (+)     substance abuse  alcohol use, esophageal cancer   Endo/Other  negative endocrine ROS  Renal/GU Renal disease  negative genitourinary   Musculoskeletal  (+) Arthritis , Osteoarthritis,  Gout   Abdominal   Peds negative pediatric ROS (+)  Hematology negative hematology ROS (+)   Anesthesia Other Findings   Reproductive/Obstetrics negative OB ROS                             Anesthesia Physical  Anesthesia Plan  ASA: IV  Anesthesia Plan: General   Post-op Pain Management:    Induction: Intravenous  PONV Risk Score and Plan: 2 and Treatment may vary due to age or medical condition, Propofol infusion and Ondansetron  Airway Management Planned: Oral ETT  Additional Equipment: None  Intra-op Plan:   Post-operative Plan: Extubation in OR  Informed Consent: I have reviewed the patients History and Physical, chart, labs and discussed the procedure including the risks, benefits and alternatives for the proposed anesthesia with  the patient or authorized representative who has indicated his/her understanding and acceptance.   Dental advisory given  Plan Discussed with: CRNA and Anesthesiologist  Anesthesia Plan Comments: (Will have glidescope in room. Recent intubation documented as grade 1 view with Mac 4, but multiple previous intubations done with glidescope.  )        Anesthesia Quick Evaluation                                  Anesthesia Evaluation  Patient identified by MRN, date of birth, ID band Patient awake    Reviewed: Allergy & Precautions, NPO status , Patient's Chart, lab work & pertinent test results  Airway Mallampati: II  TM Distance: >3 FB     Dental  (+) Teeth Intact, Dental Advisory Given   Pulmonary former smoker,    breath sounds clear to auscultation       Cardiovascular hypertension,  Rhythm:Regular Rate:Normal     Neuro/Psych    GI/Hepatic   Endo/Other    Renal/GU      Musculoskeletal   Abdominal   Peds  Hematology   Anesthesia Other Findings   Reproductive/Obstetrics                             Anesthesia Physical Anesthesia Plan  ASA: III  Anesthesia Plan: General   Post-op Pain Management:    Induction: Intravenous  PONV Risk Score and Plan: Ondansetron and Dexamethasone  Airway Management Planned: Oral ETT  Additional Equipment:   Intra-op Plan:   Post-operative Plan: Extubation in OR  Informed Consent: I have reviewed the patients History and Physical, chart, labs and discussed the procedure including the risks, benefits and alternatives for the proposed anesthesia with the patient or authorized representative who has indicated his/her understanding and acceptance.   Dental advisory given  Plan Discussed with: CRNA and Anesthesiologist  Anesthesia Plan Comments:         Anesthesia Quick Evaluation

## 2018-02-09 ENCOUNTER — Inpatient Hospital Stay: Payer: No Typology Code available for payment source | Attending: Oncology | Admitting: Nurse Practitioner

## 2018-05-01 ENCOUNTER — Telehealth: Payer: Self-pay | Admitting: *Deleted

## 2018-05-01 NOTE — Telephone Encounter (Signed)
Call report received requesting signature of death certificate.  Patient expired in the home.

## 2018-05-28 DEATH — deceased

## 2018-08-12 IMAGING — XA IR REPLC DUODEN/JEJUNO TUBE PERCUT W/FLUORO
2 series · 5 of 5 positions shown · non-contrast
Comparison: none

INDICATION: Inadvertent removal of jejunostomy catheter

[Series 1: fl - angio · 4 of 15 frames shown]
[frame 3/15]
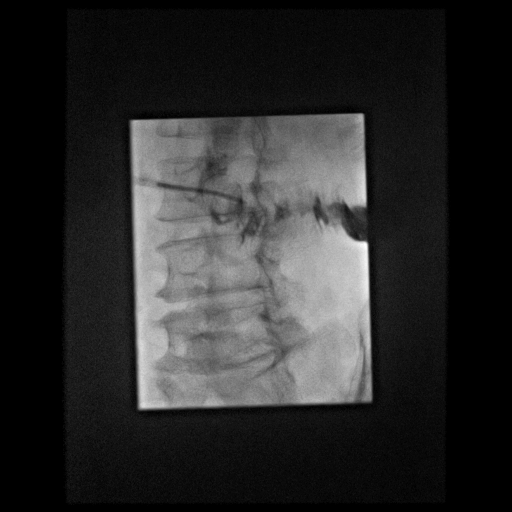
[frame 7/15]
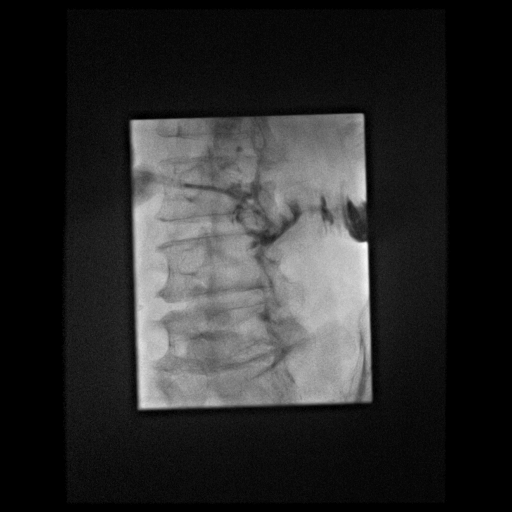
[frame 8/15]
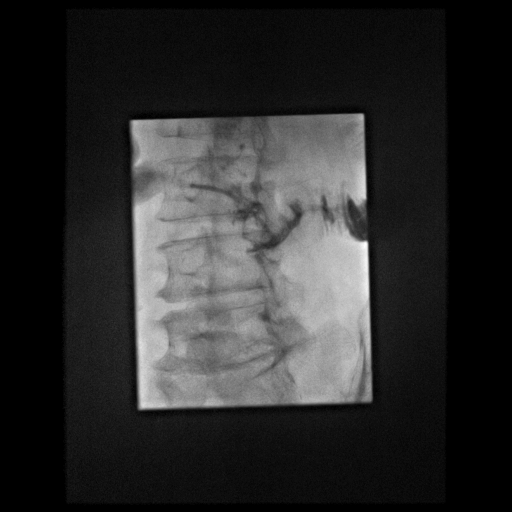
[frame 13/15]
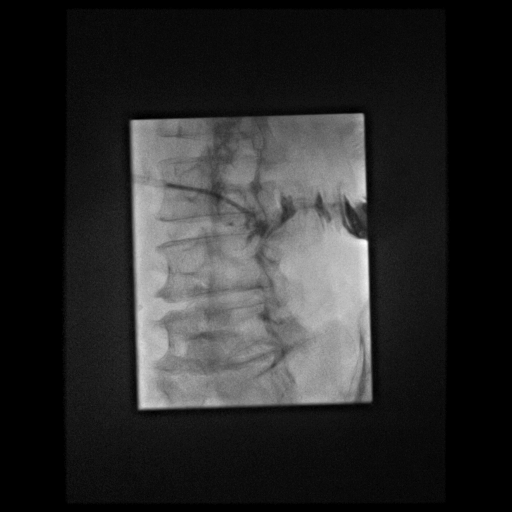

[Series 300: ir replc duoden/jejuno tube percut w/flu · 1 of 1 slices shown]
[im 1/1]
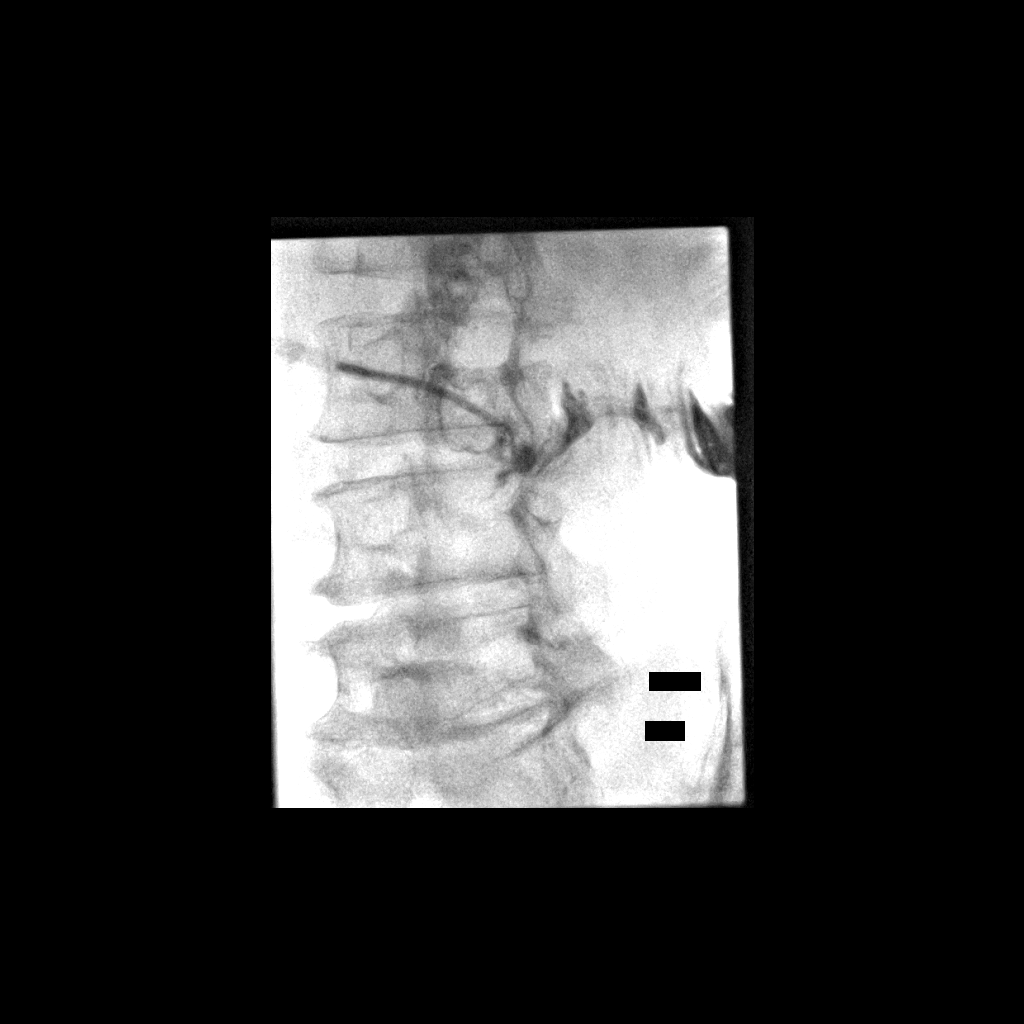

[5 of 5 positions shown; findings below may reference images not displayed]

EXAM:
JEJUNOSTOMY CATHETER REPLACEMENT UNDER FLUOROSCOPY

MEDICATIONS:
Viscous lidocaine topically

ANESTHESIA/SEDATION:
None required

PROCEDURE:
Informed written consent was obtained from the patient after a
thorough discussion of the procedural risks, benefits and
alternatives. All questions were addressed. Maximal Sterile Barrier
Technique was utilized including caps, mask, sterile gowns, sterile
gloves, sterile drape, hand hygiene and skin antiseptic. A timeout
was performed prior to the initiation of the procedure.

The site of previous jejunostomy catheter was catheterized under
fluoroscopy with a Kumpe the catheter. Small contrast injection
demonstrated access to the lumen of small bowel. The catheter was
exchanged over an Amplatz wire for serial vascular dilators which
facilitated advancement of a 16 French balloon retention
single-lumen catheter. The retention balloon inflated with 7 mL
sterile saline. Contrast injection demonstrates appropriate position
and patency. External bumper applied. The patient tolerated the
procedure well.

CONTRAST:  5 mL 8sovue-G00-administered into the jejunal lumen.

FLUOROSCOPY TIME:  0.6 minutes; 31 uAymL DAP

COMPLICATIONS:
None immediate.
IMPRESSION: 1. Technically successful replacement of 16 French balloon retention
jejunostomy catheter under fluoroscopy. Okay for routine small bowel
use.

## 2018-09-11 IMAGING — XA IR REPLC DUODEN/JEJUNO TUBE PERCUT W/FLUORO
1 series · 1 of 1 positions shown · non-contrast
Comparison: none

INDICATION: 71-year-old with a jejunal feeding tube. The feeding tube was
completely dislodged and patient needs a new tube for nutrition.

[Series 300: ir replc duoden/jejuno tube percut w/flu · 1 of 1 slices shown]
[im 1/1]
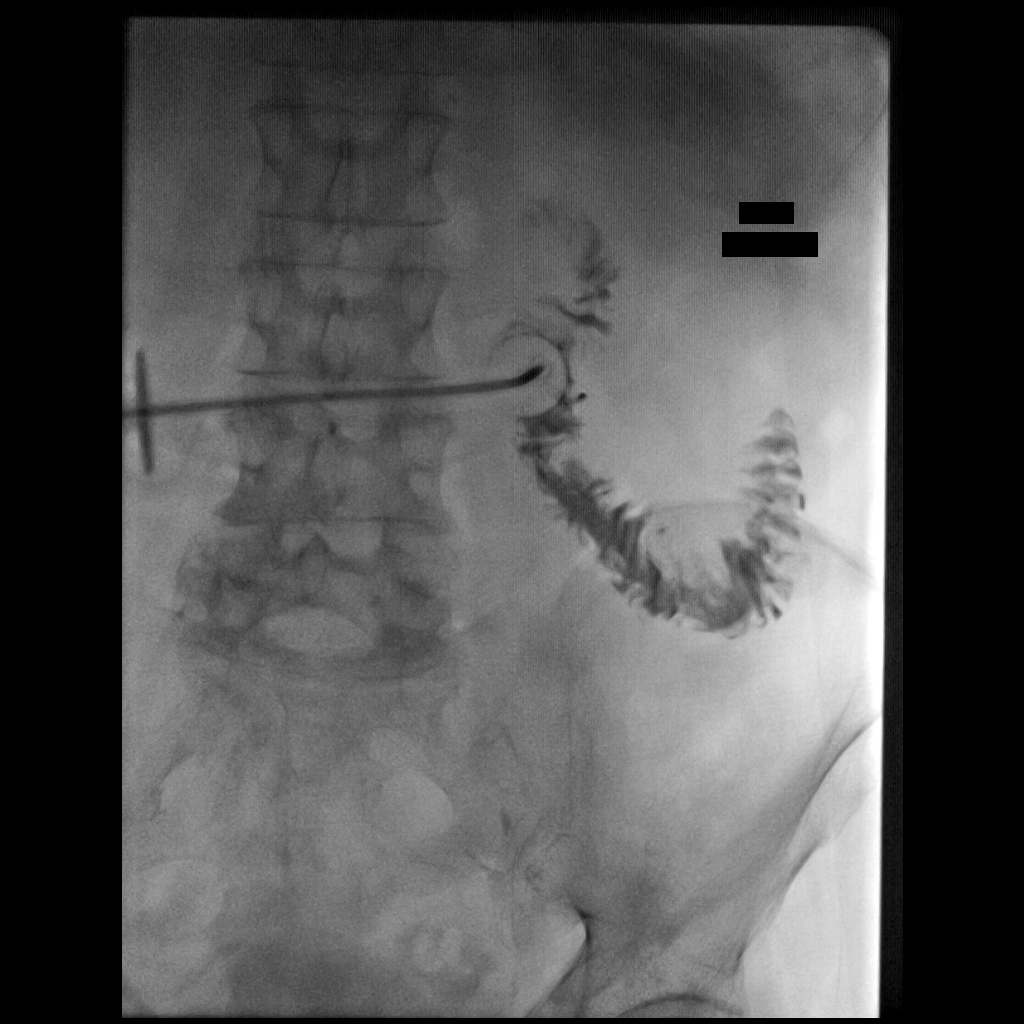

[1 of 1 positions shown; findings below may reference images not displayed]

EXAM:
REPLACEMENT OF JEJUNAL FEEDING TUBE

MEDICATIONS:
None

ANESTHESIA/SEDATION:
None

CONTRAST:  10 mL injected into the bowel lumen.

FLUOROSCOPY TIME:  Fluoroscopy Time: 18 seconds, 0.9 mGy

COMPLICATIONS:
None immediate.

PROCEDURE:
Patient's abdomen was evaluated. There was a small opening from the
dislodged jejunal feeding tube. The left side of the abdomen was
prepped and draped in a sterile fashion. Maximal barrier sterile
technique was utilized including caps, mask, sterile gowns, sterile
gloves, sterile drape, hand hygiene and skin antiseptic. A 16 French
Entuit balloon retention feeding tube was advanced into the jejunum
using viscous lidocaine. Balloon was inflated with 5 mL of saline.
Contrast was injected to confirm placement in the small bowel.

Fluoroscopic images were taken and saved for this procedure.
FINDINGS: Feeding tube is well positioned in the small bowel.
IMPRESSION: Successful replacement of the jejunal feeding tube. Tube is ready to
be used.
